# Patient Record
Sex: Female | Born: 1970 | Race: Black or African American | Hispanic: No | Smoking: Never smoker
Health system: Southern US, Community
[De-identification: ages and names within clinical notes are randomized; demographics above are authoritative.]

## PROBLEM LIST (undated history)

## (undated) DIAGNOSIS — K219 Gastro-esophageal reflux disease without esophagitis: Secondary | ICD-10-CM

## (undated) DIAGNOSIS — F329 Major depressive disorder, single episode, unspecified: Secondary | ICD-10-CM

## (undated) DIAGNOSIS — Z800 Family history of malignant neoplasm of digestive organs: Secondary | ICD-10-CM

## (undated) DIAGNOSIS — F419 Anxiety disorder, unspecified: Secondary | ICD-10-CM

## (undated) DIAGNOSIS — R011 Cardiac murmur, unspecified: Secondary | ICD-10-CM

## (undated) DIAGNOSIS — G4730 Sleep apnea, unspecified: Secondary | ICD-10-CM

## (undated) DIAGNOSIS — R519 Headache, unspecified: Secondary | ICD-10-CM

## (undated) DIAGNOSIS — E669 Obesity, unspecified: Secondary | ICD-10-CM

## (undated) DIAGNOSIS — D649 Anemia, unspecified: Secondary | ICD-10-CM

## (undated) DIAGNOSIS — F32A Depression, unspecified: Secondary | ICD-10-CM

## (undated) DIAGNOSIS — R569 Unspecified convulsions: Secondary | ICD-10-CM

## (undated) DIAGNOSIS — Z803 Family history of malignant neoplasm of breast: Secondary | ICD-10-CM

## (undated) HISTORY — PX: ABDOMINAL HYSTERECTOMY: SHX81

## (undated) HISTORY — PX: TUBAL LIGATION: SHX77

## (undated) HISTORY — DX: Family history of malignant neoplasm of digestive organs: Z80.0

## (undated) HISTORY — PX: THROAT SURGERY: SHX803

## (undated) HISTORY — PX: CHOLECYSTECTOMY: SHX55

## (undated) HISTORY — PX: GASTRIC BYPASS: SHX52

## (undated) HISTORY — DX: Family history of malignant neoplasm of breast: Z80.3

---

## 1995-12-26 HISTORY — PX: BREAST LUMPECTOMY: SHX2

## 1998-06-21 ENCOUNTER — Ambulatory Visit (HOSPITAL_COMMUNITY): Admission: RE | Admit: 1998-06-21 | Discharge: 1998-06-21 | Payer: Self-pay | Admitting: Obstetrics

## 1998-06-21 ENCOUNTER — Other Ambulatory Visit: Admission: RE | Admit: 1998-06-21 | Discharge: 1998-06-21 | Payer: Self-pay | Admitting: Obstetrics

## 1998-10-11 ENCOUNTER — Inpatient Hospital Stay (HOSPITAL_COMMUNITY): Admission: AD | Admit: 1998-10-11 | Discharge: 1998-10-11 | Payer: Self-pay | Admitting: Obstetrics

## 1998-10-29 ENCOUNTER — Ambulatory Visit (HOSPITAL_COMMUNITY): Admission: RE | Admit: 1998-10-29 | Discharge: 1998-10-29 | Payer: Self-pay | Admitting: Obstetrics

## 1999-01-03 ENCOUNTER — Inpatient Hospital Stay (HOSPITAL_COMMUNITY): Admission: AD | Admit: 1999-01-03 | Discharge: 1999-01-03 | Payer: Self-pay | Admitting: Obstetrics

## 1999-01-20 ENCOUNTER — Inpatient Hospital Stay (HOSPITAL_COMMUNITY): Admission: AD | Admit: 1999-01-20 | Discharge: 1999-01-21 | Payer: Self-pay | Admitting: Obstetrics

## 1999-01-24 ENCOUNTER — Inpatient Hospital Stay (HOSPITAL_COMMUNITY): Admission: AD | Admit: 1999-01-24 | Discharge: 1999-01-26 | Payer: Self-pay | Admitting: Obstetrics

## 2000-07-11 ENCOUNTER — Other Ambulatory Visit: Admission: RE | Admit: 2000-07-11 | Discharge: 2000-07-11 | Payer: Self-pay | Admitting: Obstetrics

## 2000-07-16 ENCOUNTER — Encounter: Admission: RE | Admit: 2000-07-16 | Discharge: 2000-07-16 | Payer: Self-pay | Admitting: Obstetrics

## 2000-07-16 ENCOUNTER — Encounter: Payer: Self-pay | Admitting: Obstetrics

## 2000-11-22 ENCOUNTER — Encounter (HOSPITAL_BASED_OUTPATIENT_CLINIC_OR_DEPARTMENT_OTHER): Payer: Self-pay | Admitting: General Surgery

## 2000-11-23 ENCOUNTER — Encounter (INDEPENDENT_AMBULATORY_CARE_PROVIDER_SITE_OTHER): Payer: Self-pay | Admitting: *Deleted

## 2000-11-23 ENCOUNTER — Ambulatory Visit (HOSPITAL_COMMUNITY): Admission: RE | Admit: 2000-11-23 | Discharge: 2000-11-23 | Payer: Self-pay | Admitting: General Surgery

## 2001-02-13 ENCOUNTER — Inpatient Hospital Stay (HOSPITAL_COMMUNITY): Admission: AD | Admit: 2001-02-13 | Discharge: 2001-02-13 | Payer: Self-pay | Admitting: Obstetrics

## 2001-03-07 ENCOUNTER — Ambulatory Visit (HOSPITAL_COMMUNITY): Admission: RE | Admit: 2001-03-07 | Discharge: 2001-03-07 | Payer: Self-pay | Admitting: Obstetrics

## 2001-03-07 ENCOUNTER — Encounter: Payer: Self-pay | Admitting: Obstetrics

## 2001-10-17 ENCOUNTER — Inpatient Hospital Stay (HOSPITAL_COMMUNITY): Admission: AD | Admit: 2001-10-17 | Discharge: 2001-10-17 | Payer: Self-pay | Admitting: Obstetrics

## 2001-10-18 ENCOUNTER — Inpatient Hospital Stay (HOSPITAL_COMMUNITY): Admission: AD | Admit: 2001-10-18 | Discharge: 2001-10-18 | Payer: Self-pay | Admitting: Obstetrics

## 2001-11-19 ENCOUNTER — Inpatient Hospital Stay (HOSPITAL_COMMUNITY): Admission: AD | Admit: 2001-11-19 | Discharge: 2001-11-19 | Payer: Self-pay | Admitting: Obstetrics

## 2001-12-20 ENCOUNTER — Inpatient Hospital Stay (HOSPITAL_COMMUNITY): Admission: AD | Admit: 2001-12-20 | Discharge: 2001-12-20 | Payer: Self-pay | Admitting: Obstetrics

## 2001-12-24 ENCOUNTER — Encounter: Payer: Self-pay | Admitting: Obstetrics

## 2001-12-24 ENCOUNTER — Ambulatory Visit (HOSPITAL_COMMUNITY): Admission: RE | Admit: 2001-12-24 | Discharge: 2001-12-24 | Payer: Self-pay | Admitting: Obstetrics

## 2002-01-09 ENCOUNTER — Inpatient Hospital Stay (HOSPITAL_COMMUNITY): Admission: AD | Admit: 2002-01-09 | Discharge: 2002-01-09 | Payer: Self-pay | Admitting: Obstetrics

## 2002-02-03 ENCOUNTER — Inpatient Hospital Stay (HOSPITAL_COMMUNITY): Admission: AD | Admit: 2002-02-03 | Discharge: 2002-02-03 | Payer: Self-pay | Admitting: Obstetrics

## 2002-02-04 ENCOUNTER — Observation Stay (HOSPITAL_COMMUNITY): Admission: AD | Admit: 2002-02-04 | Discharge: 2002-02-05 | Payer: Self-pay | Admitting: Obstetrics

## 2002-02-05 ENCOUNTER — Encounter: Payer: Self-pay | Admitting: Obstetrics

## 2002-03-02 ENCOUNTER — Observation Stay (HOSPITAL_COMMUNITY): Admission: AD | Admit: 2002-03-02 | Discharge: 2002-03-02 | Payer: Self-pay | Admitting: Obstetrics

## 2002-03-07 ENCOUNTER — Inpatient Hospital Stay (HOSPITAL_COMMUNITY): Admission: AD | Admit: 2002-03-07 | Discharge: 2002-03-12 | Payer: Self-pay | Admitting: Obstetrics

## 2002-03-11 ENCOUNTER — Encounter: Payer: Self-pay | Admitting: Obstetrics

## 2002-03-14 ENCOUNTER — Inpatient Hospital Stay (HOSPITAL_COMMUNITY): Admission: AD | Admit: 2002-03-14 | Discharge: 2002-03-14 | Payer: Self-pay | Admitting: Obstetrics

## 2002-03-18 ENCOUNTER — Encounter: Payer: Self-pay | Admitting: Obstetrics

## 2002-03-18 ENCOUNTER — Encounter (HOSPITAL_COMMUNITY): Admission: RE | Admit: 2002-03-18 | Discharge: 2002-03-18 | Payer: Self-pay | Admitting: Obstetrics

## 2002-03-19 ENCOUNTER — Inpatient Hospital Stay (HOSPITAL_COMMUNITY): Admission: AD | Admit: 2002-03-19 | Discharge: 2002-03-21 | Payer: Self-pay | Admitting: Obstetrics

## 2002-03-19 ENCOUNTER — Encounter (INDEPENDENT_AMBULATORY_CARE_PROVIDER_SITE_OTHER): Payer: Self-pay

## 2002-10-12 ENCOUNTER — Emergency Department (HOSPITAL_COMMUNITY): Admission: EM | Admit: 2002-10-12 | Discharge: 2002-10-12 | Payer: Self-pay | Admitting: Emergency Medicine

## 2002-10-12 ENCOUNTER — Encounter: Payer: Self-pay | Admitting: Emergency Medicine

## 2003-07-01 ENCOUNTER — Emergency Department (HOSPITAL_COMMUNITY): Admission: EM | Admit: 2003-07-01 | Discharge: 2003-07-01 | Payer: Self-pay | Admitting: Emergency Medicine

## 2009-09-17 ENCOUNTER — Emergency Department (HOSPITAL_COMMUNITY): Admission: EM | Admit: 2009-09-17 | Discharge: 2009-09-17 | Payer: Self-pay | Admitting: Emergency Medicine

## 2009-09-20 ENCOUNTER — Inpatient Hospital Stay (HOSPITAL_COMMUNITY): Admission: EM | Admit: 2009-09-20 | Discharge: 2009-09-22 | Payer: Self-pay | Admitting: Emergency Medicine

## 2009-09-20 ENCOUNTER — Ambulatory Visit: Payer: Self-pay | Admitting: Family Medicine

## 2010-12-31 ENCOUNTER — Inpatient Hospital Stay (HOSPITAL_COMMUNITY)
Admission: EM | Admit: 2010-12-31 | Discharge: 2011-01-05 | Payer: Self-pay | Source: Home / Self Care | Attending: Internal Medicine | Admitting: Internal Medicine

## 2011-01-03 ENCOUNTER — Encounter: Payer: Self-pay | Admitting: Internal Medicine

## 2011-01-09 LAB — CROSSMATCH
ABO/RH(D): B POS
ABO/RH(D): B POS
Antibody Screen: NEGATIVE
Antibody Screen: NEGATIVE
Unit division: 0
Unit division: 0
Unit division: 0
Unit division: 0

## 2011-01-09 LAB — IRON AND TIBC
Iron: 288 ug/dL — ABNORMAL HIGH (ref 42–135)
Iron: <10 ug/dL — ABNORMAL LOW (ref 42–135)
Saturation Ratios: 71 % — ABNORMAL HIGH (ref 20–55)
TIBC: 408 ug/dL (ref 250–470)
UIBC: 120 ug/dL
UIBC: 379 ug/dL

## 2011-01-09 LAB — COMPREHENSIVE METABOLIC PANEL
ALT: <8 U/L (ref 0–35)
AST: 17 U/L (ref 0–37)
Albumin: 3.3 g/dL — ABNORMAL LOW (ref 3.5–5.2)
Alkaline Phosphatase: 36 U/L — ABNORMAL LOW (ref 39–117)
BUN: 11 mg/dL (ref 6–23)
CO2: 23 mEq/L (ref 19–32)
Calcium: 8.7 mg/dL (ref 8.4–10.5)
Chloride: 109 mEq/L (ref 96–112)
Creatinine, Ser: 0.76 mg/dL (ref 0.4–1.2)
GFR calc Af Amer: >60 mL/min (ref >60)
GFR calc non Af Amer: >60 mL/min (ref >60)
Glucose, Bld: 86 mg/dL (ref 70–99)
Potassium: 3.8 mEq/L (ref 3.5–5.1)
Sodium: 138 mEq/L (ref 135–145)
Total Bilirubin: 2.4 mg/dL — ABNORMAL HIGH (ref 0.3–1.2)
Total Protein: 7 g/dL (ref 6.0–8.3)

## 2011-01-09 LAB — CBC
HCT: 17.2 % — ABNORMAL LOW (ref 36.0–46.0)
HCT: 24 % — ABNORMAL LOW (ref 36.0–46.0)
HCT: 23.6 % — ABNORMAL LOW (ref 36.0–46.0)
HCT: 25 % — ABNORMAL LOW (ref 36.0–46.0)
HCT: 25.5 % — ABNORMAL LOW (ref 36.0–46.0)
HCT: 28.2 % — ABNORMAL LOW (ref 36.0–46.0)
Hemoglobin: 4.7 g/dL — CL (ref 12.0–15.0)
Hemoglobin: 7.4 g/dL — ABNORMAL LOW (ref 12.0–15.0)
Hemoglobin: 7.1 g/dL — ABNORMAL LOW (ref 12.0–15.0)
Hemoglobin: 7.4 g/dL — ABNORMAL LOW (ref 12.0–15.0)
Hemoglobin: 7.5 g/dL — ABNORMAL LOW (ref 12.0–15.0)
Hemoglobin: 8.5 g/dL — ABNORMAL LOW (ref 12.0–15.0)
MCH: 15.4 pg — ABNORMAL LOW (ref 26.0–34.0)
MCH: 19.7 pg — ABNORMAL LOW (ref 26.0–34.0)
MCH: 19.3 pg — ABNORMAL LOW (ref 26.0–34.0)
MCH: 19 pg — ABNORMAL LOW (ref 26.0–34.0)
MCH: 19.2 pg — ABNORMAL LOW (ref 26.0–34.0)
MCH: 20.4 pg — ABNORMAL LOW (ref 26.0–34.0)
MCHC: 27.3 g/dL — ABNORMAL LOW (ref 30.0–36.0)
MCHC: 30.8 g/dL (ref 30.0–36.0)
MCHC: 30.1 g/dL (ref 30.0–36.0)
MCHC: 29.6 g/dL — ABNORMAL LOW (ref 30.0–36.0)
MCHC: 29.4 g/dL — ABNORMAL LOW (ref 30.0–36.0)
MCHC: 30.1 g/dL (ref 30.0–36.0)
MCV: 56.4 fL — ABNORMAL LOW (ref 78.0–100.0)
MCV: 64 fL — ABNORMAL LOW (ref 78.0–100.0)
MCV: 64.1 fL — ABNORMAL LOW (ref 78.0–100.0)
MCV: 64.3 fL — ABNORMAL LOW (ref 78.0–100.0)
MCV: 65.4 fL — ABNORMAL LOW (ref 78.0–100.0)
MCV: 67.8 fL — ABNORMAL LOW (ref 78.0–100.0)
Platelets: 274 10*3/uL (ref 150–400)
Platelets: 245 10*3/uL (ref 150–400)
Platelets: 213 10*3/uL (ref 150–400)
Platelets: 250 10*3/uL (ref 150–400)
Platelets: 224 10*3/uL (ref 150–400)
Platelets: 273 10*3/uL (ref 150–400)
RBC: 3.05 MIL/uL — ABNORMAL LOW (ref 3.87–5.11)
RBC: 3.75 MIL/uL — ABNORMAL LOW (ref 3.87–5.11)
RBC: 3.68 MIL/uL — ABNORMAL LOW (ref 3.87–5.11)
RBC: 3.89 MIL/uL (ref 3.87–5.11)
RBC: 3.9 MIL/uL (ref 3.87–5.11)
RBC: 4.16 MIL/uL (ref 3.87–5.11)
RDW: 21 % — ABNORMAL HIGH (ref 11.5–15.5)
RDW: 26.9 % — ABNORMAL HIGH (ref 11.5–15.5)
RDW: 27 % — ABNORMAL HIGH (ref 11.5–15.5)
RDW: 27.7 % — ABNORMAL HIGH (ref 11.5–15.5)
RDW: 28.7 % — ABNORMAL HIGH (ref 11.5–15.5)
RDW: 30.6 % — ABNORMAL HIGH (ref 11.5–15.5)
WBC: 6.9 10*3/uL (ref 4.0–10.5)
WBC: 6.5 10*3/uL (ref 4.0–10.5)
WBC: 6.4 10*3/uL (ref 4.0–10.5)
WBC: 7.7 10*3/uL (ref 4.0–10.5)
WBC: 9.9 10*3/uL (ref 4.0–10.5)
WBC: 8.5 10*3/uL (ref 4.0–10.5)

## 2011-01-09 LAB — DIFFERENTIAL
Basophils Absolute: 0 10*3/uL (ref 0.0–0.1)
Basophils Relative: 0 % (ref 0–1)
Eosinophils Absolute: 0.1 10*3/uL (ref 0.0–0.7)
Eosinophils Relative: 1 % (ref 0–5)
Lymphocytes Relative: 27 % (ref 12–46)
Lymphs Abs: 1.9 10*3/uL (ref 0.7–4.0)
Monocytes Absolute: 0.6 10*3/uL (ref 0.1–1.0)
Monocytes Relative: 9 % (ref 3–12)
Neutro Abs: 4.3 10*3/uL (ref 1.7–7.7)
Neutrophils Relative %: 62 % (ref 43–77)

## 2011-01-09 LAB — UIFE/LIGHT CHAINS/TP QN, 24-HR UR
Albumin, U: DETECTED
Alpha 1, Urine: DETECTED — AB
Alpha 2, Urine: DETECTED — AB
Beta, Urine: DETECTED — AB
Free Kappa Lt Chains,Ur: 3.22 mg/dL — ABNORMAL HIGH (ref 0.04–1.51)
Free Lambda Lt Chains,Ur: 0.61 mg/dL (ref 0.08–1.01)
Gamma Globulin, Urine: NOT DETECTED
Total Protein, Urine: 4.4 mg/dL

## 2011-01-09 LAB — POCT I-STAT, CHEM 8
BUN: 13 mg/dL (ref 6–23)
Calcium, Ion: 1.13 mmol/L (ref 1.12–1.32)
Chloride: 107 mEq/L (ref 96–112)
Creatinine, Ser: 0.8 mg/dL (ref 0.4–1.2)
Glucose, Bld: 87 mg/dL (ref 70–99)
HCT: 17 % — ABNORMAL LOW (ref 36.0–46.0)
Hemoglobin: 5.8 g/dL — CL (ref 12.0–15.0)
Potassium: 3.8 mEq/L (ref 3.5–5.1)
Sodium: 139 mEq/L (ref 135–145)
TCO2: 21 mmol/L (ref 0–100)

## 2011-01-09 LAB — VITAMIN B12
Vitamin B-12: 482 pg/mL (ref 211–911)
Vitamin B-12: 458 pg/mL (ref 211–911)

## 2011-01-09 LAB — BASIC METABOLIC PANEL
BUN: 10 mg/dL (ref 6–23)
BUN: 8 mg/dL (ref 6–23)
CO2: 22 mEq/L (ref 19–32)
CO2: 24 mEq/L (ref 19–32)
Calcium: 8.6 mg/dL (ref 8.4–10.5)
Calcium: 8.8 mg/dL (ref 8.4–10.5)
Chloride: 109 mEq/L (ref 96–112)
Chloride: 104 mEq/L (ref 96–112)
Creatinine, Ser: 0.76 mg/dL (ref 0.4–1.2)
Creatinine, Ser: 0.81 mg/dL (ref 0.4–1.2)
GFR calc Af Amer: >60 mL/min (ref >60)
GFR calc Af Amer: >60 mL/min (ref >60)
GFR calc non Af Amer: >60 mL/min (ref >60)
GFR calc non Af Amer: >60 mL/min (ref >60)
Glucose, Bld: 106 mg/dL — ABNORMAL HIGH (ref 70–99)
Glucose, Bld: 80 mg/dL (ref 70–99)
Potassium: 3.6 mEq/L (ref 3.5–5.1)
Potassium: 3.8 mEq/L (ref 3.5–5.1)
Sodium: 137 mEq/L (ref 135–145)
Sodium: 133 mEq/L — ABNORMAL LOW (ref 135–145)

## 2011-01-09 LAB — SAMPLE TO BLOOD BANK

## 2011-01-09 LAB — GLIADIN ANTIBODIES, SERUM
Gliadin IgA: 6 U/mL (ref <20)
Gliadin IgG: 3.2 U/mL (ref <20)

## 2011-01-09 LAB — T3, FREE: T3, Free: 2.7 pg/mL (ref 2.3–4.2)

## 2011-01-09 LAB — FOLATE
Folate: 13.4 ng/mL
Folate: 10.1 ng/mL

## 2011-01-09 LAB — PROTEIN ELECTROPH W RFLX QUANT IMMUNOGLOBULINS
Albumin ELP: 49.5 % — ABNORMAL LOW (ref 55.8–66.1)
Alpha-1-Globulin: 5.3 % — ABNORMAL HIGH (ref 2.9–4.9)
Alpha-2-Globulin: 10.3 % (ref 7.1–11.8)
Beta 2: 6.6 % — ABNORMAL HIGH (ref 3.2–6.5)
Beta Globulin: 7.8 % — ABNORMAL HIGH (ref 4.7–7.2)
Gamma Globulin: 20.5 % — ABNORMAL HIGH (ref 11.1–18.8)
M-Spike, %: NOT DETECTED g/dL
Total Protein ELP: 6.5 g/dL (ref 6.0–8.3)

## 2011-01-09 LAB — OCCULT BLOOD, POC DEVICE: Fecal Occult Bld: POSITIVE

## 2011-01-09 LAB — T4, FREE: Free T4: 0.97 ng/dL (ref 0.80–1.80)

## 2011-01-09 LAB — SEDIMENTATION RATE: Sed Rate: 62 mm/hr — ABNORMAL HIGH (ref 0–22)

## 2011-01-09 LAB — FERRITIN
Ferritin: 3 ng/mL — ABNORMAL LOW (ref 10–291)
Ferritin: 8 ng/mL — ABNORMAL LOW (ref 10–291)

## 2011-01-09 LAB — TISSUE TRANSGLUTAMINASE, IGA: Tissue Transglutaminase Ab, IgA: 11.7 U/mL (ref <20)

## 2011-01-09 LAB — PREPARE RBC (CROSSMATCH)

## 2011-01-09 LAB — TSH: TSH: 7.125 u[IU]/mL — ABNORMAL HIGH (ref 0.350–4.500)

## 2011-01-11 LAB — RETICULIN ANTIBODIES, IGA W TITER: Reticulin Ab, IgA: NEGATIVE

## 2011-01-26 NOTE — Discharge Summary (Signed)
Evelyn [name], Evelyn [name]                 ACCOUNT NO.:  [id number]  MEDICAL RECORD NO.:  [id number]          PATIENT TYPE:  INP  LOCATION:  4739                         FACILITY:  [facility]  PHYSICIAN:  Ladell Pier, M.D.   DATE OF BIRTH:  1971/11/29  DATE OF ADMISSION:  01/01/2011 DATE OF DISCHARGE:  01/05/2011                              DISCHARGE SUMMARY   DISCHARGE DIAGNOSES: 1. Headaches. 2. 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 3. Endometriosis. 4. History of migraines. 5. Acute blood loss anemia with iron deficiency. 6. Tachycardia secondary to severe anemia with echo done January 02, 2011 shows normal systolic function, normal wall motion     abnormality.  Tachycardia, now resolved. 7. Subclinical hypothyroidism.  DISCHARGE MEDICATIONS:  Fioricet p.r.n. and iron 325 mg three times daily.  FOLLOWUP APPOINTMENTS:  The patient has an appointment to follow up with [facility] on January 25 and at that appointment discuss another follow up with outpatient GYN for endometrial ablation versus hysterectomy.  PROCEDURES:  Esophagram has had abnormality narrowing on the esophagus in the lower neck at the level of C6 and C7 on the right side, question of mass.  Followup endoscopy revealed normal esophagus.  Abdominal ultrasound showed multiple intramural uterine leiomyomas.  No endometrial lesion is seen, small amount of free fluid in the pelvis. CT of the head is normal study.  CONSULTANTS:  GI, Iva Boop, MD, [facility]  HISTORY OF PRESENT ILLNESS:  The patient is a 40 year old female with complaints of left-sided headache excruciating pain for the last 5 days. She states that the pain has been constant and associated with nausea, but no photophobia.  She denies any emesis.  The patient denies any neck stiffness or meningismus.  She denies any vision changes.  The patient had not been had imaging recently of the head.  The patient is requesting further evaluation of  her headache because of routine labs. On the evaluation in the ED, she was noted to be anemic with hemoglobin of 4.7, MCV of 56.1.  The patient will be admitted for further workup of her anemia.  Past medical history, family history, social history, meds, allergies, review of systems per admission H and P.  PHYSICAL EXAMINATION:  VITAL SIGNS:  At the time of discharge, temperature 98.2, pulse 68, respirations 18, blood pressure 100/63, pulse ox of 100% on room air. GENERAL:  The patient is sitting up in bed, well-nourished African American female. HEENT:  Normocephalic, atraumatic.  Pupils are reactive to light without erythema. CARDIOVASCULAR:  Regular rate and rhythm. LUNGS:  Clear bilaterally. ABDOMEN:  Positive bowel sounds. EXTREMITIES:  Edema.  HOSPITAL COURSE: 1. Chronic blood loss anemia:  The patient was transfused 3 units of     blood.  Initial hemoglobin 4.7, hemoglobin now 8.5.  The patient     was told to take iron as her ferrin is 3.  She will follow up with     [facility] to possibly schedule with the GYN to get endometrial     ablation versus hysterectomy. 2. Severe headaches:  The patient had head CT that was  negative.  Her     headaches have now resolved.  She was discharged on Fioricet p.r.n.     and she could follow up with [facility] as outpatient for further     management of her headache. 3. Abnormal esophagram:  The patient had a esophagram done that was     abnormal, question of mass.  She then later had a EGD follow up by     Dr. Leone Payor which was normal.  LABORATORY DATA:  At the time of discharge, sodium 133, potassium 3.8, chloride 104, CO2 of 24, glucose 80, BUN 8, creatinine 0.81, calcium 8.8.  WBC 8.5, hemoglobin 8.5, MCV 67.8, platelet of 273, free T4 of 0.97.  Ferritin of 8, folate of 13.4, vitamin B12 of 482, TSH of 7.125.  Time spent with the patient and doing this discharge was approximately 35 minutes.     Ladell Pier,  M.D.     NJ/MEDQ  D:  01/05/2011  T:  01/05/2011  Job:  [id number]  Electronically Signed by Ladell Pier M.D. on 01/26/2011 01:32:38 PM

## 2011-01-26 NOTE — Procedures (Signed)
Summary: Upper Endoscopy w/DIL  Patient: Evelyn [name] Note: All result statuses are Final unless otherwise noted.  Tests: (1) Upper Endoscopy w/DIL (UED)  UED Upper Endoscopy w/DIL                             DONE     [facility]     [address]     [place], [place]  [address]           ENDOSCOPY PROCEDURE REPORT           PATIENT:  Evelyn, [name]  MR#:  [id number]     BIRTHDATE:  08/03/1971, 39 yrs. old  GENDER:  female           ENDOSCOPIST:  Iva Boop, MD, [facility]           PROCEDURE DATE:  01/03/2011     PROCEDURE:  EGD, diagnostic, Maloney Dilation of the Esophagus     ASA CLASS:  Class II     INDICATIONS:  1) abnormal GI imaging  2) dysphagia           MEDICATIONS:   Fentanyl 50 mcg IV, Versed 5 mg IV     TOPICAL ANESTHETIC:  Cetacaine Spray           DESCRIPTION OF PROCEDURE:   After the risks benefits and     alternatives of the procedure were thoroughly explained, informed     consent was obtained.  The EG-[id number] ([id number]) endoscope was     introduced through the mouth and advanced to the second portion of     the duodenum, limited slightly by retained food.  The instrument     was slowly withdrawn as the mucosa was carefully examined.     <<PROCEDUREIMAGES>>           The upper, middle, and distal third of the esophagus were     carefully inspected and no abnormalities were noted. The z-line     was well seen at the GEJ. The endoscope was pushed into the fundus     which was normal including a retroflexed view. The antrum, first     and second part of the duodenum were unremarkable except for some     retained food in the body.  Dilation was then performed at the     total esophagus           1) Dilator:  Elease Hashimoto  Size(s):  54 french     Resistance:  moderate  Heme:  yes     Appearance:           COMPLICATIONS:  None           ENDOSCOPIC IMPRESSION:     1) Normal EGD - no abnormality as described on barium swallow     was seen.  RECOMMENDATIONS:     Clear liquids only until 5 PM then soft foods. Normal foods     tomorrow.           REPEAT EXAM:  In for as needed.           Iva Boop, MD, [facility]           CC:  [facility]           n.     eSIGNED:   Iva Boop at 01/03/2011 02:17 PM  Evelyn, [name], [id number]  Note: An exclamation mark (!) indicates a result that was not dispersed into the flowsheet. Document Creation Date: 01/03/2011 2:17 PM _______________________________________________________________________  (1) Order result status: Final Collection or observation date-time: 01/03/2011 14:12 Requested date-time:  Receipt date-time:  Reported date-time:  Referring Physician:   Ordering Physician: Stan Head [phone]) Specimen Source:  Source: Launa Grill Order Number: [id number] Lab site:

## 2011-02-04 NOTE — H&P (Signed)
Evelyn [name], Evelyn [name]                 ACCOUNT NO.:  [id number]  MEDICAL RECORD NO.:  [id number]          PATIENT TYPE:  EMS  LOCATION:  MAJO                         FACILITY:  [facility]  PHYSICIAN:  Massie Maroon, MD        DATE OF BIRTH:  May 23, 1971  DATE OF ADMISSION:  12/31/2010 DATE OF DISCHARGE:                             HISTORY & PHYSICAL   CHIEF COMPLAINT:  "I am tired of my headache."  HISTORY OF PRESENT ILLNESS:  A 40 year old female with complaints of left-sided headache, excruciating pain for last 5 days.  She states that the pain has been constant and associated with nausea, but no photophobia.  She denies any emesis.  The patient denies any neck stiffness or meningismus.  She denies any vision changes.  The patient has not had imaging recently of the head.  The patient is requesting further evaluation of her headache.  Because of routine labs on evaluation in the ED, she was noted to be anemic with a hemoglobin of 4.7.  MCV was low at 56.1 and RDW was elevated at 21.0.  The patient will be admitted for workup of headache as well as anemia.  PAST MEDICAL HISTORY: 1. Anemia. 2. Excessive bleeding with her period. 3. Endometriosis. 4. History of migraines. 5. Uterine fibroids.  PAST SURGICAL HISTORY:  Laparoscopy for endometriosis and lumpectomy of her left breast.  SOCIAL HISTORY:  The patient does not smoke or drink.  She is from [place] originally.  She has 3 children.  She has had normal spontaneous vaginal delivery x3 and 1 miscarriage.  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  FAMILY HISTORY:  Mother is alive at age 12 and has diabetes and had a stroke.  Father is alive at age 82 and has diabetes and prostate cancer.  ALLERGIES:  MORPHINE.  MEDICATIONS:  Ibuprofen 800 mg p.o. b.i.d. and Midrin.  REVIEW OF SYSTEMS:  Negative for fever, chills.  Negative for abdominal pain, diarrhea, bright red blood per rectum, or black stool.  Negative for all  10-organ systems except for pertinent positives stated above.  PHYSICAL EXAMINATION:  VITAL SIGNS:  Temperature 98.4, pulse 83, blood pressure 122/55, pulse ox 100% on room air. HEENT:  Anicteric.  Pale conjunctiva. NECK:  No JVD, no bruit. HEART:  Tachycardic S1 and S2 with 2/6 systolic ejection murmur right upper sternal border. LUNGS:  Clear to auscultation bilaterally. ABDOMEN:  Soft, nontender, nondistended.  Positive bowel sounds. EXTREMITIES:  No cyanosis, clubbing, or edema. SKIN:  No rashes. LYMPH NODES:  No adenopathy. NEUROLOGIC:  Nonfocal.  LABORATORY DATA:  Hemoccult stool positive.  BUN 13, creatinine 0.8. Sodium 139, potassium 3.8.  WBC 6.9, hemoglobin 4.7, platelet count is 274, MCV 56.4, RDW is 21.0.  ASSESSMENT/PLAN: 1. Anemia:  Check iron studies, B12, folic acid, SPEP, UPEP, TSH,     check a celiac panel.  Please obtain a GI consult for evaluation of     iron-deficiency anemia presumed at this time and heme-positive     stool.  We appreciate their input.  The patient will be typed and  crossed 3 units packed red blood cells and transfused. 2. Headache:  Check a sed rate.  Check a CT of brain noncontrast.     Vicodin as needed for headache. 3. Tachycardia:  We will obtain a cardiac 2-D echo. 4. Cardiac murmur.  We will obtain a cardiac 2-D echo. 5. Deep venous thrombosis prophylaxis.  SCDs.     Massie Maroon, MD     JYK/MEDQ  D:  01/01/2011  T:  01/01/2011  Job:  [id number]  Electronically Signed by Pearson Grippe MD on 02/04/2011 10:35:27 PM

## 2011-02-09 ENCOUNTER — Ambulatory Visit: Payer: Self-pay | Admitting: Obstetrics & Gynecology

## 2011-02-09 ENCOUNTER — Other Ambulatory Visit: Payer: Self-pay | Admitting: Obstetrics & Gynecology

## 2011-02-09 ENCOUNTER — Other Ambulatory Visit (HOSPITAL_COMMUNITY)
Admission: RE | Admit: 2011-02-09 | Discharge: 2011-02-09 | Disposition: A | Discharge status: Home / Self Care | Payer: Self-pay | Source: Ambulatory Visit | Attending: Obstetrics & Gynecology | Admitting: Obstetrics & Gynecology

## 2011-02-09 ENCOUNTER — Encounter: Payer: Self-pay | Admitting: Obstetrics & Gynecology

## 2011-02-09 DIAGNOSIS — E669 Obesity, unspecified: Secondary | ICD-10-CM

## 2011-02-09 DIAGNOSIS — Z01818 Encounter for other preprocedural examination: Secondary | ICD-10-CM

## 2011-02-09 DIAGNOSIS — Z01419 Encounter for gynecological examination (general) (routine) without abnormal findings: Secondary | ICD-10-CM

## 2011-02-09 DIAGNOSIS — N938 Other specified abnormal uterine and vaginal bleeding: Secondary | ICD-10-CM | POA: Insufficient documentation

## 2011-02-09 DIAGNOSIS — N949 Unspecified condition associated with female genital organs and menstrual cycle: Secondary | ICD-10-CM | POA: Insufficient documentation

## 2011-02-09 DIAGNOSIS — N920 Excessive and frequent menstruation with regular cycle: Secondary | ICD-10-CM

## 2011-02-09 LAB — CONVERTED CEMR LAB
HCT: 29.1 % — ABNORMAL LOW (ref 36.0–46.0)
Hemoglobin: 9 g/dL — ABNORMAL LOW (ref 12.0–15.0)
MCHC: 30.9 g/dL (ref 30.0–36.0)
MCV: 74.2 fL — ABNORMAL LOW (ref 78.0–100.0)
Platelets: 284 10*3/uL (ref 150–400)
Prolactin: 10.7 ng/mL
RBC: 3.92 M/uL (ref 3.87–5.11)
TSH: 3.259 microintl units/mL (ref 0.350–4.500)
WBC: 5.5 10*3/uL (ref 4.0–10.5)

## 2011-02-28 ENCOUNTER — Encounter (HOSPITAL_COMMUNITY)
Admission: RE | Admit: 2011-02-28 | Discharge: 2011-02-28 | Disposition: A | Discharge status: Home / Self Care | Payer: Self-pay | Source: Ambulatory Visit | Attending: Obstetrics & Gynecology | Admitting: Obstetrics & Gynecology

## 2011-02-28 DIAGNOSIS — Z01812 Encounter for preprocedural laboratory examination: Secondary | ICD-10-CM | POA: Insufficient documentation

## 2011-02-28 LAB — CBC
HCT: 29.7 % — ABNORMAL LOW (ref 36.0–46.0)
Hemoglobin: 9.1 g/dL — ABNORMAL LOW (ref 12.0–15.0)
MCH: 22.5 pg — ABNORMAL LOW (ref 26.0–34.0)
MCHC: 30.6 g/dL (ref 30.0–36.0)
MCV: 73.5 fL — ABNORMAL LOW (ref 78.0–100.0)
Platelets: 311 10*3/uL (ref 150–400)
RBC: 4.04 MIL/uL (ref 3.87–5.11)
RDW: 24.7 % — ABNORMAL HIGH (ref 11.5–15.5)
WBC: 5.5 10*3/uL (ref 4.0–10.5)

## 2011-02-28 LAB — TYPE AND SCREEN
ABO/RH(D): B POS
Antibody Screen: NEGATIVE

## 2011-02-28 LAB — SURGICAL PCR SCREEN
MRSA, PCR: NEGATIVE
Staphylococcus aureus: NEGATIVE

## 2011-02-28 LAB — ABO/RH: ABO/RH(D): B POS

## 2011-03-07 ENCOUNTER — Other Ambulatory Visit: Payer: Self-pay | Admitting: Obstetrics & Gynecology

## 2011-03-07 ENCOUNTER — Inpatient Hospital Stay (HOSPITAL_COMMUNITY)
Admission: RE | Admit: 2011-03-07 | Discharge: 2011-03-09 | DRG: 743 | Disposition: A | Discharge status: Home / Self Care | Payer: Self-pay | Source: Ambulatory Visit | Attending: Obstetrics & Gynecology | Admitting: Obstetrics & Gynecology

## 2011-03-07 DIAGNOSIS — N920 Excessive and frequent menstruation with regular cycle: Principal | ICD-10-CM | POA: Diagnosis present

## 2011-03-07 DIAGNOSIS — D251 Intramural leiomyoma of uterus: Secondary | ICD-10-CM | POA: Diagnosis present

## 2011-03-07 DIAGNOSIS — D252 Subserosal leiomyoma of uterus: Secondary | ICD-10-CM | POA: Diagnosis present

## 2011-03-07 DIAGNOSIS — N801 Endometriosis of ovary: Secondary | ICD-10-CM | POA: Diagnosis present

## 2011-03-07 DIAGNOSIS — N80109 Endometriosis of ovary, unspecified side, unspecified depth: Secondary | ICD-10-CM

## 2011-03-07 DIAGNOSIS — N949 Unspecified condition associated with female genital organs and menstrual cycle: Secondary | ICD-10-CM | POA: Diagnosis present

## 2011-03-07 DIAGNOSIS — N8300 Follicular cyst of ovary, unspecified side: Secondary | ICD-10-CM

## 2011-03-07 DIAGNOSIS — N9489 Other specified conditions associated with female genital organs and menstrual cycle: Secondary | ICD-10-CM | POA: Diagnosis present

## 2011-03-07 LAB — PREGNANCY, URINE: Preg Test, Ur: NEGATIVE

## 2011-03-08 LAB — COMPREHENSIVE METABOLIC PANEL
ALT: 11 U/L (ref 0–35)
BUN: 8 mg/dL (ref 6–23)
CO2: 22 mEq/L (ref 19–32)
Calcium: 8.4 mg/dL (ref 8.4–10.5)
GFR calc non Af Amer: >60 mL/min (ref >60)
Glucose, Bld: 91 mg/dL (ref 70–99)
Sodium: 135 mEq/L (ref 135–145)

## 2011-03-08 LAB — CBC
HCT: 22 % — ABNORMAL LOW (ref 36.0–46.0)
MCHC: 31.4 g/dL (ref 30.0–36.0)
WBC: 6.8 10*3/uL (ref 4.0–10.5)

## 2011-03-08 NOTE — Op Note (Signed)
[name], [name]                 ACCOUNT NO.:  [id number]  MEDICAL RECORD NO.:  [id number]           PATIENT TYPE:  I  LOCATION:  9309                          [facility]  PHYSICIAN:  Horton Chin, MD DATE OF BIRTH:  1971-10-19  DATE OF PROCEDURE:  03/07/2011 DATE OF DISCHARGE:                              OPERATIVE REPORT   PREOPERATIVE DIAGNOSES:  Chronic pelvic pain, menometrorrhagia, fibroids.  POSTOPERATIVE DIAGNOSES:  Chronic pelvic pain, menometrorrhagia, fibroids.  PROCEDURE:  Total abdominal hysterectomy and bilateral salpingo- oophorectomy.  SURGEON:  Horton Chin, MD  ASSISTANT:  Lesly Dukes, MD  ANESTHESIOLOGIST:  Dana Allan, MD  ANESTHESIA:  General.  IV FLUIDS:  2600 mL of lactated Ringer's.  ESTIMATED BLOOD LOSS:  550 mL.  URINE OUTPUT:  400 mL.  INDICATIONS:  The patient is a 40 year old gravida 4, para 3-0-1-3 with a diagnosis of endometriosis on laparoscopy done in 1998.  She also has a history of uterine fibroids and menometrorrhagia, and desired definitive surgical management.  Prior to surgery, the patient was advised about the risks of surgery including but not limited to: bleeding, infection, injury to surrounding organs, need for additional procedures, thromboembolic phenomena, incisional problems, the fact that she will be menopausal and will need hormone replacement therapy and the patient wanted to proceed with plan.  Written informed consent was obtained.  FINDINGS:  Large fibroid uterus that weighed 534 grams in the OR, normal- appearing adnexa bilaterally.  SPECIMENS:  Uterus, cervix, bilateral adnexa which were sent to pathology.  COMPLICATIONS:  None immediate.  PROCEDURE DETAILS:  The patient received preoperative antibiotics and had sequential compression devices applied to her lower extremities while in the preoperative area.  She was then taken to the operating room where general analgesia was  administered and found to be adequate. The patient was then placed in the dorsal supine position, and prepped and draped in a sterile manner.  A Foley catheter was inserted into the patient's bladder and attached to constant gravity.  After an adequate time-out was performed, attention was turned to the patient's abdomen where a Pfannenstiel incision was made with scalpel and carried through underlying layer of fascia.  The fascia was incised bilaterally using Mayo scissors.  Kochers were then placed on both aspects of the incision and the underlying rectus muscles were dissected off bluntly and sharply.  The rectus muscles were separated in midline and the peritoneum was entered bluntly.  This incision was extended with good visualization of bladder and bowel.  Attention was then turned to the patient's pelvis where the fibroid uterus was recognized.  The bowels were packed away with laparotomy sponges and the Balfour retractor was placed.  At this point, large Kelly clamps were placed on the adnexa on both sides and the round ligaments on both sides were identified, clamped, cut and suture ligated, allowing access into the broad ligaments bilaterally.  The anterior leaf of the broad ligament was also incised to create a bladder flap.  At this point, the infundibulopelvic ligaments were also clamped, cut, and suture ligated bilaterally allowing for bilateral salpingo-oophorectomy.  Of  note, all sutures using this case are 0 Vicryl unless otherwise noted.  The uterine arteries on both sides were then skeletonized and they were clamped, cut and suture ligated.  At this point, given the bulk of the uterus, an amputation was done to remove the fundal part of the uterus and then Kochers were used to hold the cervical stump.  The rest of the cardinal and uterosacral ligaments were clamped, cut and suture ligated bilaterally.  Acutely curved clamps were then placed underneath the cervix, and  the cervix was freed from the surrounding upper vagina using Jorgenson scissors.  The corners of the incision were then secured using Heaney stitches, and the rest of the vaginal cuff was repaired using a running interlocking stitch.  Good hemostasis was noted overall. The abdomen and pelvis were irrigated and hemostasis was reconfirmed on all surfaces. Bilateral ureters were palpated and also visualized to be peristalsing  away from the area of surgery.  All instruments were then removed from the patient's pelvis.  Two interrupted sutures were placed involving the rectus muscles and peritoneum to hold the muscles together in the midline, and avoid midline bowel protrusion. At this point, the fascia was reapproximated using 0 PDS in a running fashion.  The subcutaneous layer was irrigated and reapproximated with 2-0 plain gut with 3 interrupted stitches and the skin was closed with 3-0 Monocryl subcuticular stitch.  The patient tolerated procedure well.  Sponge, instrument, needle counts were correct x2.  She was taken to recovery room in awake, extubated and in stable condition.     Horton Chin, MD     UAA/MEDQ  D:  03/07/2011  T:  03/08/2011  Job:  [id number]  Electronically Signed by Jaynie Collins MD on 03/08/2011 10:01:12 AM

## 2011-03-09 LAB — CROSSMATCH
ABO/RH(D): B POS
Antibody Screen: NEGATIVE
Unit division: 0
Unit division: 0
Unit division: 0

## 2011-03-09 LAB — CBC
HCT: 28.8 % — ABNORMAL LOW (ref 36.0–46.0)
MCHC: 31.9 g/dL (ref 30.0–36.0)
MCV: 75.6 fL — ABNORMAL LOW (ref 78.0–100.0)
Platelets: 229 10*3/uL (ref 150–400)
RDW: 20.1 % — ABNORMAL HIGH (ref 11.5–15.5)
WBC: 8.4 10*3/uL (ref 4.0–10.5)

## 2011-03-10 NOTE — Discharge Summary (Signed)
  Evelyn [name], Evelyn [name]                 ACCOUNT NO.:  [id number]  MEDICAL RECORD NO.:  [id number]           PATIENT TYPE:  I  LOCATION:  9309                          [facility]  PHYSICIAN:  Evelyn Chin, MD DATE OF BIRTH:  1971-06-24  DATE OF ADMISSION:  03/07/2011 DATE OF DISCHARGE:  03/09/2011                              DISCHARGE SUMMARY   ADMISSION DIAGNOSES: 1. Chronic pelvic pain with history of endometriosis. 2. Menometrorrhagia. 3. Leiomyomata.  DISCHARGE DIAGNOSES: 1. Chronic pelvic pain with history of endometriosis. 2. Menometrorrhagia. 3. Leiomyomata.  PROCEDURES: 1. Total abdominal hysterectomy. 2. Bilateral salpingo-oophorectomy.  PERTINENT STUDIES:  Preoperative hemoglobin of 9.1, postoperative hemoglobin of 6.9.  The patient was transfused with 3 units of blood and discharge hemoglobin was 9.1.  Surgical pathology showed leiomyomata, benign endometrium, ovarian tissue had endometriosis and endosalpingiosis, multiple follicle cysts, fallopian tubal tissue with serosal adhesions. No atypia or malignancy seen.  CLINIC WEIGHT OF SPECIMEN:  534 g.  BRIEF HOSPITAL COURSE:  The patient is a 40 year old gravida 4, para 3-0- 1-3 with a history of endometriosis and menometrorrhagia.  The patient also has a history of leiomyomata.  She desired definitive surgical management.  The patient underwent an uncomplicated TAH-BSO on March 07, 2011.  For further details of this operation, please refer to separate dictated operative report.  After surgery, the patient was noted to be anemic and was symptomatic.  As such, she did get a transfusion of 3 units of packed red blood cells on postoperative day #1.  Her post transfusion hemoglobin was 9.1. The patient's pain was controlled on oral medications.  She was ambulating without difficulty, voiding without difficulty, and passing flatus by postoperative day #2.  The patient had no problems with her incision that was  clean, dry, and intact without erythema or induration.  The patient does have Steri- Strips and was instructed on how to take care of her incision.  By postoperative day #2, the patient was deemed stable for discharge home.  DISCHARGE MEDICATIONS: 1. Percocet 5/325 mg 1-2 tablets p.o. q.6 hours p.r.n. pain. 2. Estradiol 1 mg p.o. daily. 3. Colace 100 mg p.o. b.i.d. p.r.n. constipation. 4. Motrin 600 mg p.o. q.6 hours p.r.n. pain.  DISCHARGE INSTRUCTIONS:  The patient was given routine discharge instructions including pelvic rest for 6 weeks and other routine discharge instructions. A followup appointment has been scheduled  at the [facility] on April 20, 2011, at  2 p.m.; this appointment was communicated to the patient and she  was told to call the clinic or come to the [facility]U for any further gynecologic or postoperative concerns.     Evelyn Chin, MD     UAA/MEDQ  D:  03/09/2011  T:  03/10/2011  Job:  [id number]  Electronically Signed by Jaynie Collins MD on 03/10/2011 11:22:38 AM

## 2011-03-17 NOTE — Group Therapy Note (Signed)
Evelyn [name], Evelyn [name]                 ACCOUNT NO.:  [id number]  MEDICAL RECORD NO.:  [id number]           PATIENT TYPE:  A  LOCATION:  [facility]                   FACILITY:  [facility]  PHYSICIAN:  Jaynie Collins, MD     DATE OF BIRTH:  April 19, 1971  DATE OF SERVICE:  02/09/2011                                 CLINIC NOTE  REASON FOR VISIT:  Surgical consultation for management of fibroids, menometrorrhagia, and endometriosis.  HISTORY OF PRESENT ILLNESS:  The patient is a 40 year old gravida 4, para 3-0-1-3 with a diagnosis of endometriosis on laparoscopy done in 1998 and uterine fibroids and menometrorrhagia, who is here for discussion about hysterectomy for management.  As for her endometriosis, the patient says she was diagnosed during laparoscopy in 1998, and she has had persistent pain.  She is on Fioricet as needed for her pain and has not had any recent hormonal therapy or treatment for this.  As for her dysfunctional uterine bleeding, the patient has said that this has gone on since 2006.  She was followed by Dr. Gaynell Face in the past who did multiple endometrial biopsies which were negative.  She was initially scheduled for hysterectomy in 2008, but was unable to do this due to financial concerns and insurance concerns.  In the last 2 years, she has had 2 blood transfusions and she reports persistent anemia.  The patient is on iron because of this and her last admission for transfusion was in January.  Her initial hemoglobin at that point was 4.7, a discharge hemoglobin was 8.5, and a ferritin that was done at that time was 3.  The patient also had an pelvic ultrasound done in January 2012, which showed uterus measuring 13.5 x 10 x 10 cm with multiple intramural uterine fibroids present, the largest measuring 5.3 cm in diameter and next 4.1 cm.  Her endometrial lining was noted to be 1 cm.  Her right ovary could not be visualized and her left ovary was noted to have a 3-cm cystic and  solid mass, but no calcifications and this was thought to likely be an endometrioma.  PAST MEDICAL HISTORY:  Abnormal uterine bleeding, anemia, fibroids, iron deficiency anemia.  PAST SURGICAL HISTORY:  D and C, laparoscopy, breast lump removal.  MEDICATIONS:  Iron daily and Fioricet as needed.  ALLERGIES:  MORPHINE which causes fevers, chills, and seizures according to the patient.  The patient is not allergic to latex.  SOCIAL HISTORY:  The patient is unemployed.  She lives with her family. She denies any habits.  FAMILY HISTORY:  Remarkable for a grandmother with colon cancer.  Father with prostate cancer.  A brother with diabetes.  The patient also has other family members with diabetes.  REVIEW OF SYSTEMS:  Remarkable for weight gain, weakness, fatigue, menopausal symptoms with hot flashes, headache, leg cramps, abdominal pain.  PAST OB/GYN HISTORY:  The patient has had 3 vaginal deliveries, 1 miscarriage.  She has had a tubal ligation.  She has normal Pap smears, last one being in 2010.  She does endorse a history of Trichomonas in the past and has been diagnosed with endometriosis and fibroids.  PHYSICAL EXAMINATION:  VITAL SIGNS:  Temperature 98.0, pulse 82, blood pressure 122/79, weight 266.4 pounds, and height 67-1/2 inches. GENERAL:  No apparent distress. HEENT:  Normocephalic, atraumatic. NECK:  Supple.  Normal thyroid. BREASTS:  Symmetric in size, nontender, no abnormal masses, skin changes, or lymphadenopathy noted.  The patient does have inverted nipple. ABDOMEN:  Soft, nontender, nondistended.  Obese habitus. EXTREMITIES:  No cyanosis, clubbing, or edema. PELVIC:  Normal external female genitalia.  Pink, well-rugated vagina. The patient does have some old blood mixed with vaginal discharge is seen in the vaginal vault.  She does have a multiparous cervix.  Pap smear sample was obtained.  ENDOMETRIAL BIOPSY:  The patient was counseled regarding the  endometrial biopsy and after the Pap smear was done, her cervix was swabbed with Betadine and a 3-mm Pipelle was introduced into the fundal cavity to a depth of 10 cm and this was used to obtain a moderate amount of tissue after 2 passes.  All instruments were then removed from the patient's pelvis.  The patient tolerated the procedure well.  ASSESSMENT AND PLAN:  The patient is a 40 year old gravida 4, para 3-0-1- 3, here for evaluation and management of her menometrorrhagia and endometriosis.  The patient does want definitive surgery in the form of total abdominal hysterectomy, bilateral salpingo-oophorectomy.  She was told that given that she was young and will be surgically menopausal, she will need hormone replacement therapy.  The routine risks of hysterectomy were discussed with the patient including but not limited to bleeding, infection, injury to surrounding organs, need for additional procedures, thromboembolic phenomenon, incisional problems, and she was given patient education pamphlet from the ACOG and up-to- date website discussing what to expect with a hysterectomy.  All her questions were answered.  She was told to expect to be contacted from a surgical scheduler regarding the time and date of her surgery.  Of note, we will also follow up for Pap smear and the patient will also have a mammogram scheduled for her preventative health maintenance.  She was told to call or come back in for any further gynecologic concerns or for further excessive menstrual bleeding.  The patient was given a prescription for Vicodin 60 tablets and Megace 40 mg daily 60 tablets, and told to follow up if she needs any further medication.          ______________________________ Jaynie Collins, MD    UA/MEDQ  D:  02/09/2011  T:  02/10/2011  Job:  [id number]

## 2011-03-30 ENCOUNTER — Other Ambulatory Visit: Payer: Self-pay | Admitting: Obstetrics & Gynecology

## 2011-03-31 LAB — DIFFERENTIAL
Basophils Absolute: 0 10*3/uL (ref 0.0–0.1)
Eosinophils Absolute: 0 10*3/uL (ref 0.0–0.7)
Eosinophils Relative: 0 % (ref 0–5)
Lymphocytes Relative: 20 % (ref 12–46)
Lymphocytes Relative: 21 % (ref 12–46)
Lymphs Abs: 1.1 10*3/uL (ref 0.7–4.0)
Monocytes Absolute: 0.7 10*3/uL (ref 0.1–1.0)
Myelocytes: 0 %
Neutro Abs: 3.6 10*3/uL (ref 1.7–7.7)
Neutrophils Relative %: 65 % (ref 43–77)
Neutrophils Relative %: 67 % (ref 43–77)
Promyelocytes Absolute: 0 %
nRBC: 0 /100 WBC

## 2011-03-31 LAB — BASIC METABOLIC PANEL
CO2: 23 mEq/L (ref 19–32)
Calcium: 8.8 mg/dL (ref 8.4–10.5)
Creatinine, Ser: 0.68 mg/dL (ref 0.4–1.2)
GFR calc Af Amer: >60 mL/min (ref >60)
GFR calc non Af Amer: >60 mL/min (ref >60)
Sodium: 137 mEq/L (ref 135–145)

## 2011-03-31 LAB — CROSSMATCH

## 2011-03-31 LAB — URINALYSIS, ROUTINE W REFLEX MICROSCOPIC
Ketones, ur: NEGATIVE mg/dL
Nitrite: POSITIVE — AB
Specific Gravity, Urine: 1.015 (ref 1.005–1.030)
pH: 7 (ref 5.0–8.0)

## 2011-03-31 LAB — POCT I-STAT, CHEM 8
Creatinine, Ser: 0.7 mg/dL (ref 0.4–1.2)
HCT: 21 % — ABNORMAL LOW (ref 36.0–46.0)
Hemoglobin: 7.1 g/dL — CL (ref 12.0–15.0)
Potassium: 3.9 mEq/L (ref 3.5–5.1)
Sodium: 138 mEq/L (ref 135–145)
TCO2: 21 mmol/L (ref 0–100)

## 2011-03-31 LAB — CBC
HCT: 27.8 % — ABNORMAL LOW (ref 36.0–46.0)
Hemoglobin: 5 g/dL — CL (ref 12.0–15.0)
Hemoglobin: 5.4 g/dL — CL (ref 12.0–15.0)
Hemoglobin: 8.8 g/dL — ABNORMAL LOW (ref 12.0–15.0)
MCHC: 30.4 g/dL (ref 30.0–36.0)
MCHC: 31.1 g/dL (ref 30.0–36.0)
MCV: 64.4 fL — ABNORMAL LOW (ref 78.0–100.0)
Platelets: 268 10*3/uL (ref 150–400)
RBC: 2.82 MIL/uL — ABNORMAL LOW (ref 3.87–5.11)
RBC: 3.33 MIL/uL — ABNORMAL LOW (ref 3.87–5.11)
RDW: 23.6 % — ABNORMAL HIGH (ref 11.5–15.5)
RDW: 24.6 % — ABNORMAL HIGH (ref 11.5–15.5)
RDW: 34.2 % — ABNORMAL HIGH (ref 11.5–15.5)
WBC: 6 10*3/uL (ref 4.0–10.5)
WBC: 7.1 10*3/uL (ref 4.0–10.5)

## 2011-03-31 LAB — PREPARE RBC (CROSSMATCH)

## 2011-03-31 LAB — URINE MICROSCOPIC-ADD ON

## 2011-03-31 LAB — URINE CULTURE

## 2011-03-31 LAB — HEMOCCULT GUIAC POC 1CARD (OFFICE): Fecal Occult Bld: NEGATIVE

## 2011-04-20 ENCOUNTER — Ambulatory Visit (INDEPENDENT_AMBULATORY_CARE_PROVIDER_SITE_OTHER): Payer: Self-pay | Admitting: Obstetrics & Gynecology

## 2011-04-20 DIAGNOSIS — Z09 Encounter for follow-up examination after completed treatment for conditions other than malignant neoplasm: Secondary | ICD-10-CM

## 2011-04-21 NOTE — Group Therapy Note (Signed)
Evelyn [name], Evelyn [name]                 ACCOUNT NO.:  [id number]  MEDICAL RECORD NO.:  [id number]           PATIENT TYPE:  A  LOCATION:  [facility]                   FACILITY:  [facility]  PHYSICIAN:  Jaynie Collins, MD     DATE OF BIRTH:  [birth year]  DATE OF SERVICE:  04/20/2011                                 CLINIC NOTE  REASON FOR VISIT:  Postoperative evaluation.  Evelyn [name] is a 40 year old gravida 4, para 3-0-1-3 status post a total abdominal hysterectomy and bilateral salpingo-oophorectomy on March 07, 2011.  The patient did have an uncomplicated postoperative course and also had benign pathology that was revealed to her during her postoperative day in the hospital.  Her pathology was remarkable for leiomyomata and endometriosis.  The patient also has been placed on hormone replacement therapy in the form of estradiol 1 mg daily.  She says that this does help her symptoms, but she does report occasional hot flashes.  She also reports some joint pain especially in her knees and she is wondering if this is connected to the estradiol pills.  The patient also complains of having some pain.  She has been unable to take Motrin as this causes her to have blood in her stool and she wants to know if there is any other medication she could take.  She has no other concerning symptoms.  PHYSICAL EXAMINATION:  VITAL SIGNS:  Temperature is 98.4, pulse 90, blood pressure 120/80, weight 266.5 pounds, height 66-1/2 inches. GENERAL:  No apparent distress. ABDOMEN:  Soft.  Mildly tender to palpation.  No rebound or guarding. No organomegaly or abnormal masses palpated.  Well-healing Pfannenstiel incision.  No erythema, induration, or drainage. PELVIC:  Normal external female genitalia.  Pink, well-rugated vagina. Well-healed vaginal cuff.  Normal vaginal discharge.  ASSESSMENT AND PLAN:  The patient is a 40 year old gravida 4, para 3-0-1- 3 here for postoperative check.  She is stable after total  abdominal hysterectomy and bilateral salpingo-oophorectomy for endometriosis and is on hormone replacement therapy.  The patient was told that if her joint pain does get worse, she can cut back on her estradiol dose to 0.5 mg and see if this could make her symptoms better and also control her menopausal symptoms.  As for her pain, she was given a prescription for Percocet 60 tablets to take and she was told to avoid NSAIDs for now and this is listed as an allergy for her given the GI bleed.  We will continue to monitor her symptoms.  She was told to call or come back in for any further gynecologic concerns.          ______________________________ Jaynie Collins, MD    UA/MEDQ  D:  04/20/2011  T:  04/21/2011  Job:  [id number]

## 2011-05-12 NOTE — Discharge Summary (Signed)
[facility]  Patient:    Evelyn [name], Evelyn [name] Visit Number: [id number] MRN: [id number]          Service Type: OBS Location: 910A 9118 01 Attending Physician:  Venita Sheffield Dictated by:   Kathreen Cosier, M.D. Admit Date:  03/19/2002 Discharge Date: 03/21/2002                             Discharge Summary  HISTORY OF PRESENT ILLNESS:  The patient is a 40 year old, gravida 5, para 2-0-2-2, estimated date of confinement 04/23/02.  She was admitted multiple times with premature contractions.  She was on Procardia 10 mg q.6h.  Tonight, she was admitted in labor, cervix 3 cm, and examined by the nurse in triage and said to be a breech presentation.  HOSPITAL COURSE:  She was started on magnesium sulfate, 4 g loading, 2 g per hour.  After admission to labor and delivery the patient was examined by me and found to be vertex.  This was confirmed by ultrasound.  She was vertex, -3, cervix 3 cm, 50%.  She was continued on terbutaline 5 mg p.o. q.6h. and magnesium sulfate.  Her contractions eventually ceased.  On 03/11/02, the magnesium was discontinued, and she was restarted on Procardia 10 mg p.o. q.6h.  Terbutaline was stopped.  On 03/11/02, she also had an ultrasound which gave an estimated fetal weight of 2800 g, 36+ weeks.  AFI was 6.8.  Speculum examination was negative for ruptured membranes.  It was decided because the estimated fetal weight of 2800 g, discontinue the Procardia, and that if the patient decided to go into labor she would deliver.  She was discharged on 03/12/02, not in labor, to have a non-stress test twice weekly starting on Friday, the day after discharge.  AFI weekly.  To see me on Monday for followup.  DISCHARGE MEDICATIONS:  None. Dictated by:   Kathreen Cosier, M.D. Attending Physician:  Venita Sheffield DD:  04/09/02 TD:  04/09/02 Job: [id number]

## 2011-05-12 NOTE — Discharge Summary (Signed)
[facility]  Patient:    [name], [name] Visit Number: [id number] MRN: [id number]          Service Type: OBS Location: 910A 9118 01 Attending Physician:  Venita Sheffield Dictated by:   Kathreen Cosier, M.D. Admit Date:  03/19/2002 Discharge Date: 03/21/2002                             Discharge Summary  HOSPITAL COURSE:              The patient is a 40 year old gravida 5, para 2-0-2-2, [facility] April 23, 2002, with a history of premature labor.  She was admitted in labor on March 26 at 6 cm, 100%, rapid progress, and had a normal vaginal delivery of a female with Apgars 8/9.  The patient desired sterilization.  Her hemoglobin on the day after delivery was 9.3, platelets 210.  On March 20, 2002, she underwent postpartum tubal ligation.  She did well and was discharged home on March 28.  DISCHARGE MEDICATIONS:        Tylox 1 p.o. q.4h. p.r.n.  FOLLOWUP:                     She is to see me in six weeks.  DISCHARGE DIAGNOSES:          1. Status post normal vaginal delivery.                               2. Postpartum tubal ligation. Dictated by:   Kathreen Cosier, M.D. Attending Physician:  Venita Sheffield DD:  04/09/02 TD:  04/09/02 Job: [id number]

## 2011-05-12 NOTE — Op Note (Signed)
[facility]  Patient:    Evelyn [name], Evelyn [name] Visit Number: [id number] MRN: [id number]          Service Type: ANT Location: MATC Attending Physician:  Venita Sheffield Dictated by:   Kathreen Cosier, M.D. Proc. Date: 03/20/02 Admit Date:  03/18/2002 Discharge Date: 03/18/2002                             Operative Report  PREOPERATIVE DIAGNOSES:       Multiparity.  PROCEDURE:                    Postpartum tubal ligation.  ANESTHESIA:                   General.  PROCEDURE:                    Patient placed on the operating table in supine position.  Abdomen prepped and draped.  Bladder emptied with straight catheter.  Midline subumbilical incision made, carried 1 inch ______, carried down through the fascia.  Fascia cleaned, grasped with two Kochers, and fascia and peritoneum opened with Mayo scissors.  The left tube was grasped in the mid portion with the Babcock clamp and tube traced to the fimbria.  Plain 0 suture placed in the mesosalpinx below the portion of tube within the clamp. This was tied and approximately 1 inch of tube transected.  Procedure done in the exact fashion on the other side.  Lap and sponge counts correct.  Abdomen closed in layers.  Peritoneum and fascia in continuous suture of 0 Dexon. Skin closed with subcuticular stitch of 3-0 plain.  Patient tolerated procedure well.  Taken to recovery room in good condition. Dictated by:   Kathreen Cosier, M.D. Attending Physician:  Venita Sheffield DD:  03/20/02 TD:  03/21/02 Job: [id number]

## 2011-06-24 ENCOUNTER — Emergency Department (HOSPITAL_COMMUNITY): Payer: Self-pay

## 2011-06-24 ENCOUNTER — Emergency Department (HOSPITAL_COMMUNITY)
Admission: EM | Admit: 2011-06-24 | Discharge: 2011-06-24 | Disposition: A | Discharge status: Home / Self Care | Payer: Self-pay | Attending: Emergency Medicine | Admitting: Emergency Medicine

## 2011-06-24 DIAGNOSIS — M79609 Pain in unspecified limb: Secondary | ICD-10-CM | POA: Insufficient documentation

## 2011-06-24 DIAGNOSIS — S93609A Unspecified sprain of unspecified foot, initial encounter: Secondary | ICD-10-CM | POA: Insufficient documentation

## 2011-06-24 DIAGNOSIS — IMO0002 Reserved for concepts with insufficient information to code with codable children: Secondary | ICD-10-CM | POA: Insufficient documentation

## 2011-06-24 DIAGNOSIS — M7989 Other specified soft tissue disorders: Secondary | ICD-10-CM | POA: Insufficient documentation

## 2011-12-30 ENCOUNTER — Other Ambulatory Visit: Payer: Self-pay

## 2011-12-30 ENCOUNTER — Encounter: Payer: Self-pay | Admitting: *Deleted

## 2011-12-30 ENCOUNTER — Emergency Department (HOSPITAL_COMMUNITY)
Admission: EM | Admit: 2011-12-30 | Discharge: 2011-12-31 | Disposition: A | Discharge status: Home / Self Care | Payer: Self-pay | Attending: Emergency Medicine | Admitting: Emergency Medicine

## 2011-12-30 DIAGNOSIS — R0609 Other forms of dyspnea: Secondary | ICD-10-CM | POA: Insufficient documentation

## 2011-12-30 DIAGNOSIS — R609 Edema, unspecified: Secondary | ICD-10-CM | POA: Insufficient documentation

## 2011-12-30 DIAGNOSIS — R0989 Other specified symptoms and signs involving the circulatory and respiratory systems: Secondary | ICD-10-CM | POA: Insufficient documentation

## 2011-12-30 DIAGNOSIS — R079 Chest pain, unspecified: Secondary | ICD-10-CM | POA: Insufficient documentation

## 2011-12-30 DIAGNOSIS — R0602 Shortness of breath: Secondary | ICD-10-CM | POA: Insufficient documentation

## 2011-12-30 DIAGNOSIS — R05 Cough: Secondary | ICD-10-CM | POA: Insufficient documentation

## 2011-12-30 DIAGNOSIS — R059 Cough, unspecified: Secondary | ICD-10-CM | POA: Insufficient documentation

## 2011-12-30 DIAGNOSIS — R0600 Dyspnea, unspecified: Secondary | ICD-10-CM

## 2011-12-30 DIAGNOSIS — R51 Headache: Secondary | ICD-10-CM | POA: Insufficient documentation

## 2011-12-30 NOTE — ED Notes (Signed)
Pt reports SOB, cough that started on Wednesday.  Reports CP that started then as well.  Reports that the pain is tightness in her chest.  Reports a HA since that time also.   Pt ambulatory.  CP is unchanged by anything.

## 2011-12-31 ENCOUNTER — Emergency Department (HOSPITAL_COMMUNITY): Payer: Self-pay

## 2011-12-31 LAB — URINALYSIS, ROUTINE W REFLEX MICROSCOPIC
Bilirubin Urine: NEGATIVE
Ketones, ur: NEGATIVE mg/dL
Nitrite: NEGATIVE
Urobilinogen, UA: 0.2 mg/dL (ref 0.0–1.0)

## 2011-12-31 LAB — POCT I-STAT, CHEM 8
Calcium, Ion: 1.23 mmol/L (ref 1.12–1.32)
Glucose, Bld: 121 mg/dL — ABNORMAL HIGH (ref 70–99)
HCT: 33 % — ABNORMAL LOW (ref 36.0–46.0)
Hemoglobin: 11.2 g/dL — ABNORMAL LOW (ref 12.0–15.0)
TCO2: 25 mmol/L (ref 0–100)

## 2011-12-31 LAB — CBC
HCT: 31.8 % — ABNORMAL LOW (ref 36.0–46.0)
Hemoglobin: 10.6 g/dL — ABNORMAL LOW (ref 12.0–15.0)
MCH: 25.8 pg — ABNORMAL LOW (ref 26.0–34.0)
MCHC: 33.3 g/dL (ref 30.0–36.0)

## 2011-12-31 LAB — DIFFERENTIAL
Basophils Absolute: 0 10*3/uL (ref 0.0–0.1)
Basophils Relative: 0 % (ref 0–1)
Eosinophils Absolute: 0 10*3/uL (ref 0.0–0.7)
Eosinophils Relative: 1 % (ref 0–5)
Lymphocytes Relative: 29 % (ref 12–46)

## 2011-12-31 LAB — D-DIMER, QUANTITATIVE: D-Dimer, Quant: 1.85 ug/mL-FEU — ABNORMAL HIGH (ref 0.00–0.48)

## 2011-12-31 MED ORDER — SODIUM CHLORIDE 0.9 % IV SOLN
INTRAVENOUS | Status: DC
  Administered 2011-12-31: 02:00:00 via INTRAVENOUS

## 2011-12-31 MED ORDER — SODIUM CHLORIDE 0.9 % IV BOLUS (SEPSIS)
500.0000 mL | Freq: Once | INTRAVENOUS | Status: AC
  Administered 2011-12-31: 500 mL via INTRAVENOUS

## 2011-12-31 MED ORDER — IOHEXOL 350 MG/ML SOLN
100.0000 mL | Freq: Once | INTRAVENOUS | Status: AC | PRN
  Administered 2011-12-31: 100 mL via INTRAVENOUS

## 2011-12-31 MED ORDER — IBUPROFEN 800 MG PO TABS
800.0000 mg | ORAL_TABLET | Freq: Once | ORAL | Status: AC
  Administered 2011-12-31: 800 mg via ORAL
  Filled 2011-12-31: qty 1

## 2011-12-31 NOTE — ED Notes (Signed)
Patient reports intermittent chest pain x couple days also states painful urination and requests a wet prep to be tested for trichomonas. EDP made aware

## 2011-12-31 NOTE — ED Provider Notes (Signed)
History     CSN: [id number]  Arrival date & time 12/30/11  2243   First MD Initiated Contact with Patient 12/31/11 0023      Chief Complaint  Patient presents with  . Shortness of Breath  . Chest Pain    (Consider location/radiation/quality/duration/timing/severity/associated sxs/prior treatment) Patient is a 41 y.o. female presenting with shortness of breath and chest pain. The history is provided by the patient.  Shortness of Breath  The current episode started 3 to 5 days ago. Associated symptoms include chest pain and shortness of breath.  Chest Pain Primary symptoms include shortness of breath. Pertinent negatives for primary symptoms include no abdominal pain, no nausea and no vomiting.  Pertinent negatives for associated symptoms include no numbness and no weakness.    patient's had shortness of breath since Wednesday. She also gets chest pain with the episodes. It is described as a tightness. She's also had a headache. Chest pain she states it's slightly better if she coughs. She's not coughing anything up. No fevers. No trauma. She states this feels like muscle type. No diaphoresis. No nausea vomiting. She states she had an episode like this before, before she had her hysterectomy. She states her hemoglobin was 4 at that time. She does not smoke.  History reviewed. No pertinent past medical history.  Past Surgical History  Procedure Date  . Abdominal hysterectomy     History reviewed. No pertinent family history.  History  Substance Use Topics  . Smoking status: Never Smoker   . Smokeless tobacco: Not on file  . Alcohol Use: No    OB History    Grav Para Term Preterm Abortions TAB SAB Ect Mult Living                  Review of Systems  Constitutional: Negative for activity change and appetite change.  HENT: Negative for neck stiffness.   Eyes: Negative for pain.  Respiratory: Positive for shortness of breath. Negative for chest tightness.   Cardiovascular:  Positive for chest pain. Negative for leg swelling.  Gastrointestinal: Negative for nausea, vomiting, abdominal pain and diarrhea.  Genitourinary: Negative for flank pain.  Musculoskeletal: Negative for back pain.  Skin: Negative for rash.  Neurological: Negative for weakness, numbness and headaches.  Psychiatric/Behavioral: Negative for behavioral problems.    Allergies  Morphine and related and Tylenol  Home Medications  No current outpatient prescriptions on file.  BP 114/55  Pulse 95  Temp(Src) 98.1 F (36.7 C) (Oral)  Resp 16  SpO2 99%  Physical Exam  Nursing note and vitals reviewed. Constitutional: She is oriented to person, place, and time. She appears well-developed and well-nourished.  HENT:  Head: Normocephalic and atraumatic.  Eyes: EOM are normal. Pupils are equal, round, and reactive to light.  Neck: Normal range of motion. Neck supple.  Cardiovascular: Normal rate, regular rhythm and normal heart sounds.   No murmur heard. Pulmonary/Chest: Effort normal and breath sounds normal. No respiratory distress. She has no wheezes. She has no rales.  Abdominal: Soft. Bowel sounds are normal. She exhibits no distension. There is no tenderness. There is no rebound and no guarding.  Musculoskeletal: Normal range of motion. She exhibits edema.       Mild bilateral lower extremity pitting edema  Neurological: She is alert and oriented to person, place, and time. No cranial nerve deficit.  Skin: Skin is warm and dry.  Psychiatric: She has a normal mood and affect. Her speech is normal.   pelvic  exam showed no cervix and some mild white vaginal discharge.  ED Course  Procedures (including critical care time)  Labs Reviewed  CBC - Abnormal; Notable for the following:    Hemoglobin 10.6 (*)    HCT 31.8 (*)    MCV 77.4 (*)    MCH 25.8 (*)    All other components within normal limits  D-DIMER, QUANTITATIVE - Abnormal; Notable for the following:    D-Dimer, Quant 1.85 (*)     All other components within normal limits  POCT I-STAT, CHEM 8 - Abnormal; Notable for the following:    Potassium 3.2 (*)    Glucose, Bld 121 (*)    Hemoglobin 11.2 (*)    HCT 33.0 (*)    All other components within normal limits  WET PREP, GENITAL - Abnormal; Notable for the following:    Clue Cells, Wet Prep FEW (*)    WBC, Wet Prep HPF POC FEW (*)    All other components within normal limits  DIFFERENTIAL  TROPONIN I  URINALYSIS, ROUTINE W REFLEX MICROSCOPIC  I-STAT, CHEM 8  GC/CHLAMYDIA PROBE AMP, GENITAL   Dg Chest 2 View  12/31/2011  *RADIOLOGY REPORT*  Clinical Data: Chest pain, shortness of breath.  CHEST - 2 VIEW  Comparison: None.  Findings: Minimal bibasilar atelectasis.  Otherwise, no focal areas of consolidation.  No pleural effusion or pneumothorax. Cardiomediastinal contours are within normal limits.  No acute osseous abnormality.  IMPRESSION: No acute process identified.  Original Report Authenticated By: Waneta Martins, M.D.   Ct Angio Chest W/cm &/or Wo Cm  12/31/2011  *RADIOLOGY REPORT*  Clinical Data:  Shortness of breath  CT ANGIOGRAPHY CHEST WITH CONTRAST  Technique:  Multidetector CT imaging of the chest was performed using the standard protocol during bolus administration of intravenous contrast.  Multiplanar CT image reconstructions including MIPs were obtained to evaluate the vascular anatomy.  Contrast: OMNIPAQUE IOHEXOL 350 MG/ML IV SOLN  Comparison:  12/31/2011 radiograph  Findings:  No pulmonary arterial branch filling defect identified. Heart size upper normal limits.  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  No intrathoracic lymphadenopathy. Trace pericardial fluid.  Normal caliber aorta.  Limited images through the upper abdomen show no acute abnormality.  Central airways are patent.  Minimal dependent atelectasis.  No pneumothorax.  No acute osseous abnormality identified.  Review of the MIP images confirms the above findings.   IMPRESSION: No pulmonary embolism or acute intrathoracic process identified.  Trace pericardial fluid.  Original Report Authenticated By: Waneta Martins, M.D.     1. Dyspnea   2. Chest pain      Date: 12/31/2011  Rate: 88  Rhythm: normal sinus rhythm  QRS Axis: normal  Intervals: normal  ST/T Wave abnormalities: normal  Conduction Disutrbances:none  Narrative Interpretation:   Old EKG Reviewed: none available    MDM  Dyspnea chest pain. Been going since Wednesday. Worse with exertion. EKG is reassuring. Cardiac enzymes are negative. D-dimer was done and was elevated. CT angiogram was negative for pulmonary embolism. She also complained about some vaginal discharge and dysuria. No clear infection she has had a hysterectomy. She'll be discharged home. She'll follow with cardiology.        Juliet Rude. Rubin Payor, MD 12/31/11 [phone]

## 2012-01-01 LAB — GC/CHLAMYDIA PROBE AMP, GENITAL
Chlamydia, DNA Probe: NEGATIVE
GC Probe Amp, Genital: NEGATIVE

## 2012-04-25 ENCOUNTER — Emergency Department (HOSPITAL_COMMUNITY)
Admission: EM | Admit: 2012-04-25 | Discharge: 2012-04-25 | Disposition: A | Discharge status: Home / Self Care | Payer: Self-pay | Attending: Emergency Medicine | Admitting: Emergency Medicine

## 2012-04-25 ENCOUNTER — Other Ambulatory Visit: Payer: Self-pay | Admitting: Obstetrics & Gynecology

## 2012-04-25 ENCOUNTER — Encounter (HOSPITAL_COMMUNITY): Payer: Self-pay | Admitting: *Deleted

## 2012-04-25 ENCOUNTER — Emergency Department (HOSPITAL_COMMUNITY): Payer: Self-pay

## 2012-04-25 DIAGNOSIS — R070 Pain in throat: Secondary | ICD-10-CM | POA: Insufficient documentation

## 2012-04-25 DIAGNOSIS — R509 Fever, unspecified: Secondary | ICD-10-CM | POA: Insufficient documentation

## 2012-04-25 DIAGNOSIS — R093 Abnormal sputum: Secondary | ICD-10-CM | POA: Insufficient documentation

## 2012-04-25 DIAGNOSIS — J3489 Other specified disorders of nose and nasal sinuses: Secondary | ICD-10-CM | POA: Insufficient documentation

## 2012-04-25 DIAGNOSIS — J069 Acute upper respiratory infection, unspecified: Secondary | ICD-10-CM | POA: Insufficient documentation

## 2012-04-25 DIAGNOSIS — R05 Cough: Secondary | ICD-10-CM | POA: Insufficient documentation

## 2012-04-25 DIAGNOSIS — R059 Cough, unspecified: Secondary | ICD-10-CM | POA: Insufficient documentation

## 2012-04-25 DIAGNOSIS — Z79899 Other long term (current) drug therapy: Secondary | ICD-10-CM | POA: Insufficient documentation

## 2012-04-25 DIAGNOSIS — H9209 Otalgia, unspecified ear: Secondary | ICD-10-CM | POA: Insufficient documentation

## 2012-04-25 DIAGNOSIS — IMO0001 Reserved for inherently not codable concepts without codable children: Secondary | ICD-10-CM | POA: Insufficient documentation

## 2012-04-25 MED ORDER — GUAIFENESIN-CODEINE 100-10 MG/5ML PO SYRP: 5.0000 mL | ORAL_SOLUTION | Freq: Three times a day (TID) | ORAL | Status: AC | PRN

## 2012-04-25 NOTE — Discharge Instructions (Signed)
Your chest xray was clear and did not show signs of a pneumonia. This is likely an upper respiratory infection (cold). You have been given a prescription for cough medication with codeine to help suppress the cough. Alternate Tylenol and Motrin every 4 hours for fever. Make sure to drink plenty of fluids. Follow up with your family doctor if not improving.  RESOURCE GUIDE  Dental Problems  Patients with Medicaid: [facility] [phone] [address]                                           [phone] [address] Phone:  [phone]                                                  Phone:  [phone]  If unable to pay or uninsured, contact:  [facility] or [facility]. to become qualified for the adult dental clinic.  Chronic Pain Problems Contact [facility]  [phone] Patients need to be referred by their primary care doctor.  Insufficient Money for Medicine Contact [employer] Way:  call "211" or [facility] 9083850408.  No Primary Care Doctor Call [facility]  7728145197 Other agencies that provide inexpensive medical care    [facility]  [phone]    [facility]  [phone]    [facility]  [phone]    [facility]  [phone]    [facility]  [phone])605-5888    [facility]  6045534756  Psychological Services [facility]  [phone] [facility] Services  [phone] [facility]   [phone] (emergency services ([phone])389-5309)  Substance Abuse Resources [facility]  (985)782-5430 [facility] ([phone] [facility] [phone] Floydene Flock 6090035029 Residential & Outpatient Substance Abuse Program  872-319-3596  Abuse/Neglect [facility] Child Abuse Hotline 343-481-0455 [facility] Child Abuse Hotline (630)288-1495 (After Hours)  Emergency Shelter [facility]  562-624-2835  Maternity Homes [facility] at the [place] [facility] [place] 425-234-4240 [facility] Services [phone]  MRSA Hotline #:   979-188-0810    [facility] Resources  [facility]     [employer]                          [facility] Dept. [address] [place]                       [address]      [address]  [place]                                                Cristobal Goldmann Phone:  [phone]  Phone:  [phone]                 Phone:  [phone]  [facility] Phone:  [phone]  [facility] Child Abuse Hotline (808)084-6296 681-860-5205 (After Hours)  Upper Respiratory Infection, Adult An upper respiratory infection (URI) is also sometimes known as the common cold. The upper respiratory tract includes the nose, sinuses, throat, trachea, and bronchi. Bronchi are the airways leading to the lungs. Most people improve within 1 week, but symptoms can last up to 2 weeks. A residual cough may last even longer.  CAUSES Many different viruses can infect the tissues lining the upper respiratory tract. The tissues become irritated and inflamed and often become very moist. Mucus production is also common. A cold is contagious. You can easily spread the virus to others by oral contact. This includes kissing, sharing a glass, coughing, or sneezing. Touching your mouth or nose and then touching a surface, which is then touched by another person, can also spread the virus. SYMPTOMS  Symptoms typically develop 1 to 3 days after you come in contact with a cold virus. Symptoms vary from person to person. They may include:  Runny nose.   Sneezing.   Nasal congestion.   Sinus irritation.   Sore throat.   Loss of voice (laryngitis).   Cough.   Fatigue.   Muscle aches.   Loss of appetite.   Headache.   Low-grade fever.  DIAGNOSIS    You might diagnose your own cold based on familiar symptoms, since most people get a cold 2 to 3 times a year. Your caregiver can confirm this based on your exam. Most importantly, your caregiver can check that your symptoms are not due to another disease such as strep throat, sinusitis, pneumonia, asthma, or epiglottitis. Blood tests, throat tests, and X-rays are not necessary to diagnose a common cold, but they may sometimes be helpful in excluding other more serious diseases. Your caregiver will decide if any further tests are required. RISKS AND COMPLICATIONS  You may be at risk for a more severe case of the common cold if you smoke cigarettes, have chronic heart disease (such as heart failure) or lung disease (such as asthma), or if you have a weakened immune system. The very young and very old are also at risk for more serious infections. Bacterial sinusitis, middle ear infections, and bacterial pneumonia can complicate the common cold. The common cold can worsen asthma and chronic obstructive pulmonary disease (COPD). Sometimes, these complications can require emergency medical care and may be life-threatening. PREVENTION  The best way to protect against getting a cold is to practice good hygiene. Avoid oral or hand contact with people with cold symptoms. Wash your hands often if contact occurs. There is no clear evidence that vitamin C, vitamin E, echinacea, or exercise reduces the chance of developing a cold. However, it is always recommended to get plenty of rest and practice good nutrition. TREATMENT  Treatment is directed at relieving symptoms. There is no cure. Antibiotics are not effective, because the infection is caused by a virus, not by bacteria. Treatment may include:  Increased fluid intake. Sports drinks offer valuable electrolytes, sugars, and fluids.   Breathing heated mist or steam (vaporizer or shower).   Eating chicken soup or other clear broths, and maintaining good  nutrition.   Getting plenty of rest.   Using gargles or lozenges for comfort.   Controlling fevers with ibuprofen or acetaminophen as directed by your  caregiver.   Increasing usage of your inhaler if you have asthma.  Zinc gel and zinc lozenges, taken in the first 24 hours of the common cold, can shorten the duration and lessen the severity of symptoms. Pain medicines may help with fever, muscle aches, and throat pain. A variety of non-prescription medicines are available to treat congestion and runny nose. Your caregiver can make recommendations and may suggest nasal or lung inhalers for other symptoms.  HOME CARE INSTRUCTIONS   Only take over-the-counter or prescription medicines for pain, discomfort, or fever as directed by your caregiver.   Use a warm mist humidifier or inhale steam from a shower to increase air moisture. This may keep secretions moist and make it easier to breathe.   Drink enough water and fluids to keep your urine clear or pale yellow.   Rest as needed.   Return to work when your temperature has returned to normal or as your caregiver advises. You may need to stay home longer to avoid infecting others. You can also use a face mask and careful hand washing to prevent spread of the virus.  SEEK MEDICAL CARE IF:   After the first few days, you feel you are getting worse rather than better.   You need your caregiver's advice about medicines to control symptoms.   You develop chills, worsening shortness of breath, or brown or red sputum. These may be signs of pneumonia.   You develop yellow or brown nasal discharge or pain in the face, especially when you bend forward. These may be signs of sinusitis.   You develop a fever, swollen neck glands, pain with swallowing, or white areas in the back of your throat. These may be signs of strep throat.  SEEK IMMEDIATE MEDICAL CARE IF:   You have a fever.   You develop severe or persistent headache, ear pain, sinus pain,  or chest pain.   You develop wheezing, a prolonged cough, cough up blood, or have a change in your usual mucus (if you have chronic lung disease).   You develop sore muscles or a stiff neck.  Document Released: 06/06/2001 Document Revised: 11/30/2011 Document Reviewed: 04/14/2011 [facility] Patient Information 2012 [place], [place].

## 2012-04-25 NOTE — ED Notes (Signed)
Pt reports URI symptoms with productive cough starting Monday and progressing. Pt reports yellow sputum. Pt reports body aches, nasal drainage and sore throat. Pt has tried taking robitussin and Advil.  Pt also reports fever of 102 yesterday.  Pt denies history of seasonal allergies or asthma.

## 2012-04-25 NOTE — ED Provider Notes (Signed)
History     CSN: [id number]  Arrival date & time 04/25/12  1317   First MD Initiated Contact with Patient 04/25/12 1343      Chief Complaint  Patient presents with  . Cough  . Fever  . Sore Throat  . Generalized Body Aches    (Consider location/radiation/quality/duration/timing/severity/associated sxs/prior treatment) HPI History from patient. 41 year old female who presents with 3 days of URI like symptoms. She states she began to have coughing on Monday which has progressively gotten worse. Cough is productive of yellow sputum. She has had accompanying nasal drainage, myalgias, sore throat, and right ear pain. She has tried Robitussin and Advil with little relief. Also states that she had a fever of 102 last evening, but has not had additional fevers. She is not a smoker. Denies history of allergic rhinitis or asthma. Denies associated chest pain, shortness of breath, abdominal pain, nausea, vomiting, diarrhea.  History reviewed. No pertinent past medical history.  Past Surgical History  Procedure Date  . Abdominal hysterectomy     No family history on file.  History  Substance Use Topics  . Smoking status: Never Smoker   . Smokeless tobacco: Not on file  . Alcohol Use: No    OB History    Grav Para Term Preterm Abortions TAB SAB Ect Mult Living                  Review of Systems  Constitutional: Positive for fever. Negative for chills, activity change and appetite change.  HENT: Positive for ear pain, congestion and sore throat. Negative for neck pain, neck stiffness and sinus pressure.   Eyes: Negative for visual disturbance.  Respiratory: Positive for cough. Negative for chest tightness, shortness of breath and wheezing.   Cardiovascular: Negative for chest pain, palpitations and leg swelling.  Gastrointestinal: Negative for nausea, vomiting, abdominal pain and diarrhea.  Musculoskeletal: Negative for myalgias.  Skin: Negative for color change and rash.    Neurological: Negative for dizziness, weakness and headaches.    Allergies  Morphine and related; Tylenol; and Vicodin  Home Medications   Current Outpatient Rx  Name Route Sig Dispense Refill  . ESTRADIOL 1 MG PO TABS Oral Take 1 mg by mouth daily.    . GUAIFENESIN 100 MG/5ML PO LIQD Oral Take 200 mg by mouth 3 (three) times daily as needed. Coughing    . IBUPROFEN 200 MG PO TABS Oral Take 400 mg by mouth every 6 (six) hours as needed. pain      BP 121/71  Pulse 86  Temp(Src) 99.7 F (37.6 C) (Oral)  Resp 16  SpO2 97%  Physical Exam  Nursing note and vitals reviewed. Constitutional: She is oriented to person, place, and time. She appears well-developed and well-nourished. No distress.  HENT:  Head: Normocephalic and atraumatic.  Right Ear: External ear normal.  Left Ear: External ear normal.  Mouth/Throat: Oropharynx is clear and moist. No oropharyngeal exudate.       Tympanic membranes normal bilaterally. Sinuses are nontender to percussion. Oropharynx clear.  Eyes: EOM are normal. Pupils are equal, round, and reactive to light.  Neck: Normal range of motion. Neck supple.  Cardiovascular: Normal rate, regular rhythm and normal heart sounds.   Pulmonary/Chest: Effort normal.       Scattered rhonchi throughout  Abdominal: Soft. Bowel sounds are normal. There is no tenderness. There is no rebound and no guarding.  Musculoskeletal: Normal range of motion.  Neurological: She is alert and oriented to person, place,  and time.  Skin: Skin is warm and dry. She is not diaphoretic.  Psychiatric: She has a normal mood and affect.    ED Course  Procedures (including critical care time)  Labs Reviewed - No data to display Dg Chest 2 View  04/25/2012  *RADIOLOGY REPORT*  Clinical Data: Cough and congestion.  CHEST - 2 VIEW  Comparison: CT chest and chest radiograph 12/31/2011.  Findings: Trachea is midline.  Heart size stable.  Lungs are low in volume but clear. No pleural fluid.   IMPRESSION: No acute findings.  Original Report Authenticated By: Reyes Ivan, M.D.     1. URI (upper respiratory infection)       MDM  Pt presents with URI sx and reported fever at home. She has a slightly elevated temp here (99.7). Vitals otherwise stable. Physical exam unremarkable. CXR clear. Feel this is likely URI. Instructed symptomatic treatment. Return precautions discussed. Pt agreeable to plan.        Grant Fontana, [place] 04/25/12 [phone]

## 2012-05-01 NOTE — ED Provider Notes (Signed)
Medical screening examination/treatment/procedure(s) were performed by non-physician practitioner and as supervising physician I was immediately available for consultation/collaboration.  Travin Marik, MD 05/01/12 1506 

## 2012-07-10 ENCOUNTER — Emergency Department (HOSPITAL_COMMUNITY)
Admission: EM | Admit: 2012-07-10 | Discharge: 2012-07-10 | Disposition: A | Discharge status: Home / Self Care | Payer: Self-pay | Attending: Emergency Medicine | Admitting: Emergency Medicine

## 2012-07-10 ENCOUNTER — Encounter (HOSPITAL_COMMUNITY): Payer: Self-pay | Admitting: *Deleted

## 2012-07-10 DIAGNOSIS — N750 Cyst of Bartholin's gland: Secondary | ICD-10-CM | POA: Insufficient documentation

## 2012-07-10 MED ORDER — TRAMADOL HCL 50 MG PO TABS
50.0000 mg | ORAL_TABLET | Freq: Four times a day (QID) | ORAL | Status: AC | PRN
Start: 2012-07-10 — End: 2012-07-20

## 2012-07-10 NOTE — ED Notes (Signed)
Pt asked to change into gown. 

## 2012-07-10 NOTE — ED Provider Notes (Addendum)
History   This chart was scribed for Nelia Shi, MD by Sofie Rower. The patient was seen in room TR08C/TR08C and the patient's care was started at 12:12 PM     CSN: [id number]  Arrival date & time 07/10/12  1139   None     Chief Complaint  Patient presents with  . Recurrent Skin Infections    (Consider location/radiation/quality/duration/timing/severity/associated sxs/prior treatment) Patient is a 41 y.o. female presenting with abscess. The history is provided by the patient. No language interpreter was used.  Abscess  This is a new problem. The current episode started less than one week ago. The onset was gradual. The problem occurs continuously. The problem has been gradually worsening. The abscess is present on the genitalia and groin. The problem is moderate. The abscess is characterized by swelling. It is unknown what she was exposed to. The abscess first occurred at home. Pertinent negatives include no fever, no diarrhea and no vomiting. There were no sick contacts.    Evelyn [name] is a 41 y.o. female who presents to the Emergency Department complaining of moderate, episodic skin infection located at the inner right leg onset two days ago with associated symptoms of swelling at the skin infection site. The pt informs the EDP that there has been a knot located at the right inner thigh which has been becoming inflamed over the past year. Pt has a hx of abdominal hysterectomy, allergy to morphine, allergy to Vicodin, allergy to tylenol.     History reviewed. No pertinent past medical history.  Past Surgical History  Procedure Date  . Abdominal hysterectomy     No family history on file.  History  Substance Use Topics  . Smoking status: Never Smoker   . Smokeless tobacco: Not on file  . Alcohol Use: No    OB History    Grav Para Term Preterm Abortions TAB SAB Ect Mult Living                  Review of Systems  Constitutional: Negative for fever.    Gastrointestinal: Negative for vomiting and diarrhea.  All other systems reviewed and are negative.    10 Systems reviewed and all are negative for acute change except as noted in the HPI.    Allergies  Morphine and related; Tylenol; and Vicodin  Home Medications   Current Outpatient Rx  Name Route Sig Dispense Refill  . AZO-STANDARD PO Oral Take 1 tablet by mouth 3 (three) times daily.      BP 118/62  Pulse 87  Temp 98.5 F (36.9 C) (Oral)  Resp 18  SpO2 98%  Physical Exam  Nursing note and vitals reviewed. Constitutional: She is oriented to person, place, and time. She appears well-developed and well-nourished. No distress.  HENT:  Head: Normocephalic and atraumatic.  Nose: Nose normal.  Eyes: Pupils are equal, round, and reactive to light.  Neck: Normal range of motion.  Cardiovascular: Normal rate and intact distal pulses.   Pulmonary/Chest: Effort normal. No respiratory distress.  Abdominal: Normal appearance. She exhibits no distension.  Genitourinary:     Musculoskeletal: Normal range of motion.  Neurological: She is alert and oriented to person, place, and time. No cranial nerve deficit.  Skin: Skin is warm and dry. No rash noted.  Psychiatric: She has a normal mood and affect. Her behavior is normal.    ED Course  INCISION AND DRAINAGE Date/Time: 07/10/2012 12:32 PM Performed by: Nelia Shi Authorized by: Nelva Nay  L Consent: Verbal consent obtained. Risks and benefits: risks, benefits and alternatives were discussed Consent given by: patient Patient understanding: patient states understanding of the procedure being performed Patient identity confirmed: verbally with patient Time out: Immediately prior to procedure a "time out" was called to verify the correct patient, procedure, equipment, support staff and site/side marked as required. Type: cyst Body area: anogenital Location details: Bartholin's gland Local anesthetic: lidocaine 2%  with epinephrine Anesthetic total: 2 ml Patient sedated: no Scalpel size: 11 Incision type: single straight Complexity: simple Drainage: purulent Drainage amount: copious Packing material: 1/2 in iodoform gauze Patient tolerance: Patient tolerated the procedure well with no immediate complications.   (including critical care time)  DIAGNOSTIC STUDIES: Oxygen Saturation is 98% on room air, normal by my interpretation.    COORDINATION OF CARE:    12:14PM- EDP at bedside discusses treatment plan concerning opening the boil.  12:18PM- EDP at bedside to perform lancing procedure of the boil.   Labs Reviewed - No data to display No results found.   1. Bartholin cyst       MDM         I personally performed the services described in this documentation, which was scribed in my presence. The recorded information has been reviewed and considered.    Nelia Shi, MD 07/10/12 1234  Nelia Shi, MD 07/10/12 [phone]

## 2012-07-10 NOTE — ED Notes (Signed)
Patient reports she has a boil on her right labia for 2 days that is painful and swollen

## 2012-08-01 ENCOUNTER — Ambulatory Visit: Payer: Self-pay | Admitting: Obstetrics & Gynecology

## 2012-09-06 ENCOUNTER — Ambulatory Visit: Payer: Self-pay | Admitting: Obstetrics & Gynecology

## 2013-06-22 ENCOUNTER — Encounter (HOSPITAL_COMMUNITY): Payer: Self-pay | Admitting: Emergency Medicine

## 2013-06-22 ENCOUNTER — Emergency Department (INDEPENDENT_AMBULATORY_CARE_PROVIDER_SITE_OTHER)
Admission: EM | Admit: 2013-06-22 | Discharge: 2013-06-22 | Disposition: A | Discharge status: Nursing Home (Includes ICF) | Payer: BC Managed Care – PPO | Source: Home / Self Care | Attending: Emergency Medicine | Admitting: Emergency Medicine

## 2013-06-22 ENCOUNTER — Emergency Department (HOSPITAL_COMMUNITY): Payer: BC Managed Care – PPO

## 2013-06-22 ENCOUNTER — Emergency Department (HOSPITAL_COMMUNITY)
Admission: EM | Admit: 2013-06-22 | Discharge: 2013-06-22 | Disposition: A | Discharge status: Home / Self Care | Payer: BC Managed Care – PPO | Attending: Emergency Medicine | Admitting: Emergency Medicine

## 2013-06-22 DIAGNOSIS — R079 Chest pain, unspecified: Secondary | ICD-10-CM

## 2013-06-22 DIAGNOSIS — R0789 Other chest pain: Secondary | ICD-10-CM

## 2013-06-22 DIAGNOSIS — R071 Chest pain on breathing: Secondary | ICD-10-CM | POA: Insufficient documentation

## 2013-06-22 DIAGNOSIS — R0602 Shortness of breath: Secondary | ICD-10-CM

## 2013-06-22 LAB — POCT I-STAT, CHEM 8
BUN: 8 mg/dL (ref 6–23)
Calcium, Ion: 1.2 mmol/L (ref 1.12–1.23)
Chloride: 106 mEq/L (ref 96–112)
Creatinine, Ser: 0.8 mg/dL (ref 0.50–1.10)
Glucose, Bld: 84 mg/dL (ref 70–99)
HCT: 34 % — ABNORMAL LOW (ref 36.0–46.0)
Hemoglobin: 11.6 g/dL — ABNORMAL LOW (ref 12.0–15.0)
Potassium: 3.7 mEq/L (ref 3.5–5.1)
Sodium: 141 mEq/L (ref 135–145)
TCO2: 25 mmol/L (ref 0–100)

## 2013-06-22 LAB — POCT I-STAT TROPONIN I: Troponin i, poc: 0 ng/mL (ref 0.00–0.08)

## 2013-06-22 LAB — CBC WITH DIFFERENTIAL/PLATELET
Basophils Absolute: 0 10*3/uL (ref 0.0–0.1)
Eosinophils Relative: 1 % (ref 0–5)
Lymphocytes Relative: 35 % (ref 12–46)
Neutro Abs: 2.3 10*3/uL (ref 1.7–7.7)
Neutrophils Relative %: 59 % (ref 43–77)
Platelets: 154 10*3/uL (ref 150–400)
RDW: 16.2 % — ABNORMAL HIGH (ref 11.5–15.5)
WBC: 4 10*3/uL (ref 4.0–10.5)

## 2013-06-22 LAB — D-DIMER, QUANTITATIVE: D-Dimer, Quant: 0.76 ug/mL-FEU — ABNORMAL HIGH (ref 0.00–0.48)

## 2013-06-22 MED ORDER — NITROGLYCERIN 0.4 MG SL SUBL
SUBLINGUAL_TABLET | SUBLINGUAL | Status: AC
  Filled 2013-06-22: qty 25

## 2013-06-22 MED ORDER — KETOROLAC TROMETHAMINE 30 MG/ML IJ SOLN
30.0000 mg | Freq: Once | INTRAMUSCULAR | Status: AC
  Administered 2013-06-22: 30 mg via INTRAVENOUS
  Filled 2013-06-22: qty 1

## 2013-06-22 MED ORDER — IOHEXOL 350 MG/ML SOLN
100.0000 mL | Freq: Once | INTRAVENOUS | Status: AC | PRN
  Administered 2013-06-22: 100 mL via INTRAVENOUS

## 2013-06-22 MED ORDER — ASPIRIN 81 MG PO CHEW
CHEWABLE_TABLET | ORAL | Status: AC
  Filled 2013-06-22: qty 4

## 2013-06-22 MED ORDER — ASPIRIN 81 MG PO CHEW
324.0000 mg | CHEWABLE_TABLET | Freq: Once | ORAL | Status: AC
  Administered 2013-06-22: 324 mg via ORAL

## 2013-06-22 MED ORDER — METHOCARBAMOL 500 MG PO TABS: 500.0000 mg | ORAL_TABLET | Freq: Two times a day (BID) | ORAL | Status: DC

## 2013-06-22 MED ORDER — NITROGLYCERIN 0.4 MG SL SUBL
0.4000 mg | SUBLINGUAL_TABLET | SUBLINGUAL | Status: DC | PRN
  Administered 2013-06-22: 0.4 mg via SUBLINGUAL

## 2013-06-22 MED ORDER — SODIUM CHLORIDE 0.9 % IV SOLN
Freq: Once | INTRAVENOUS | Status: AC
  Administered 2013-06-22: 17:00:00 via INTRAVENOUS

## 2013-06-22 MED ORDER — IBUPROFEN 800 MG PO TABS: 800.0000 mg | ORAL_TABLET | Freq: Three times a day (TID) | ORAL | Status: DC

## 2013-06-22 NOTE — ED Provider Notes (Signed)
History    CSN: [id number] Arrival date & time 06/22/13  1723  First MD Initiated Contact with Patient 06/22/13 1738     Chief Complaint  Patient presents with  . Chest Pain   (Consider location/radiation/quality/duration/timing/severity/associated sxs/prior Treatment) HPI  42 year old female with no significant past medical history presents complaining of chest pain and shortness of breath. Patient was sent from urgent care.patient reports while at church she developed acute onset of sharp pleuritic midsternal chest pain. Pain does radiates to her back and up to her right shoulder. Pain worsening with taking deep breath and improved with taking small breath. She has never expressed this pain before. No specific treatment tried. No complaints of fever, chills, cough, hemoptysis, lightheadedness, dizziness, nausea, vomiting, diarrhea, diaphoresis, abdominal pain, dysuria , or rash. Patient is a nonsmoker. No significant history of cardiac disease. No family history of premature cardiac death. No history of DVT or PE. No significant risk factors for PE including no hormone use, birth control pills, recent surgery, prolonged bed rest. Patient however complained that her legs has been swollen for the past week.  States swelling is waxing waning and has resolved.  patient also denies any strenuous exercise or any exertional chest pain.  History reviewed. No pertinent past medical history. Past Surgical History  Procedure Laterality Date  . Abdominal hysterectomy     No family history on file. History  Substance Use Topics  . Smoking status: Never Smoker   . Smokeless tobacco: Not on file  . Alcohol Use: No   OB History   Grav Para Term Preterm Abortions TAB SAB Ect Mult Living                 Review of Systems  All other systems reviewed and are negative.    Allergies  Morphine and related; Tylenol; and Vicodin  Home Medications   Current Outpatient Rx  Name  Route  Sig   Dispense  Refill  . ibuprofen (ADVIL,MOTRIN) 800 MG tablet   Oral   Take 800 mg by mouth every 8 (eight) hours as needed for pain.          There were no vitals taken for this visit. Physical Exam  Nursing note and vitals reviewed. Constitutional: She appears well-developed and well-nourished. No distress.  Awake, alert, nontoxic appearance. Moderately obese.  HENT:  Head: Atraumatic.  Mouth/Throat: Oropharynx is clear and moist.  Eyes: Conjunctivae are normal. Right eye exhibits no discharge. Left eye exhibits no discharge.  Neck: Neck supple.  Cardiovascular: Normal rate, regular rhythm and intact distal pulses.   Pulmonary/Chest: Effort normal. No respiratory distress (shallow breathing, without accessory muscle use). She has no wheezes. She has no rales. She exhibits tenderness (Tenderness to sternal region and upper mid chest on palpation without crepitus, deformity, emphysema, or overlying skin changes.).  Abdominal: Soft. There is no tenderness. There is no rebound.  Musculoskeletal: She exhibits no edema and no tenderness.  ROM appears intact, no obvious focal weakness  Neurological: She is alert.  Mental status and motor strength appears intact  Skin: No rash noted.  Psychiatric: She has a normal mood and affect.    ED Course  Procedures (including critical care time)   Date: 06/22/2013  Rate: 67  Rhythm: normal sinus rhythm  QRS Axis: normal  Intervals: normal and PR prolonged  ST/T Wave abnormalities: nonspecific ST/T changes  Conduction Disutrbances:first-degree A-V block   Narrative Interpretation:   Old EKG Reviewed: unchanged    5:58 PM  Patient with pleuritic chest pain. Pain is reproducible on exam. Pain is atypical for cardiac ischemic chest pain. She also has no significant risk factor for PE. However patient was sent here from urgent care with concerns for NSTEMI.  Workup initiated including d-dimer.  7:46 PM Pt has elevated d-dimer of 0.76.  CXR  unremarkable, will obtain chest CT angio to r/o PE.  11:06 PM Patient's labs are unremarkable. CT angio chest shows no evidence of PE or any other acute finding. Patient felt better after receiving pain medication. Patient states yesterday she remembered catching a person from passing out while at church yesterday.  No specific pain at that time but she think her chest wall pain may be from it.  I recommend pt to f/u with PCP for further care.  Recommend taking OTC ibuprofen for pain.  A list of resource given.  Return precaution discussed.     Labs Reviewed  CBC WITH DIFFERENTIAL - Abnormal; Notable for the following:    Hemoglobin 10.9 (*)    HCT 33.2 (*)    MCV 77.0 (*)    MCH 25.3 (*)    RDW 16.2 (*)    All other components within normal limits  D-DIMER, QUANTITATIVE - Abnormal; Notable for the following:    D-Dimer, Quant 0.76 (*)    All other components within normal limits  POCT I-STAT, CHEM 8 - Abnormal; Notable for the following:    Hemoglobin 11.6 (*)    HCT 34.0 (*)    All other components within normal limits  POCT I-STAT TROPONIN I   Dg Chest 2 View  06/22/2013   *RADIOLOGY REPORT*  Clinical Data: Chest pain for 1 day.  CHEST - 2 VIEW  Comparison:  04/25/2012.  Findings: The heart, mediastinum and hilar contours are normal. The lungs are not well expanded but appear clear except for mild atelectatic changes at the bases.  There are no effusions or pneumothoraces.    IMPRESSION: Atelectasis at the bases.  No acute findings.   Original Report Authenticated By: Sander Radon, M.D.   Ct Angio Chest Pe W/cm &/or Wo Cm  06/22/2013   *RADIOLOGY REPORT*  Clinical Data: Left side chest pain and shortness of breath.  CT ANGIOGRAPHY CHEST  Technique:  Multidetector CT imaging of the chest using the standard protocol during bolus administration of intravenous contrast. Multiplanar reconstructed images including MIPs were obtained and reviewed to evaluate the vascular anatomy.   Contrast: OMNIPAQUE IOHEXOL 350 MG/ML SOLN  Comparison: CT chest 12/31/2011 and PA and lateral chest earlier this same date.  Findings: No pulmonary embolus is identified.  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  No pleural or pericardial effusion is identified.  There is no axillary, hilar or mediastinal lymphadenopathy.  The lungs demonstrate mild dependent basilar atelectasis.  Incidentally imaged upper abdomen is unremarkable.  No focal bony abnormality is identified.  IMPRESSION: Negative for pulmonary embolus or acute disease.   Original Report Authenticated By: Holley Dexter, M.D.   1. Chest wall pain     MDM  BP 134/79  Pulse 65  Temp(Src) 98 F (36.7 C) (Oral)  Resp 18  SpO2 99%  I have reviewed nursing notes and vital signs. I personally reviewed the imaging tests through PACS system  I reviewed available ER/hospitalization records thought the EMR      Fayrene Helper, [place] 06/23/13 0036

## 2013-06-22 NOTE — ED Notes (Signed)
Patient transported to X-ray 

## 2013-06-22 NOTE — ED Provider Notes (Signed)
History    CSN: [id number] Arrival date & time 06/22/13  1544  First MD Initiated Contact with Patient 06/22/13 1618     Chief Complaint  Patient presents with  . Chest Pain    right sided chest pain that started today around 12:30 p.m. hx of heart murmur.    (Consider location/radiation/quality/duration/timing/severity/associated sxs/prior Treatment) HPI Comments: 42 year old female presents complaining of 4 hours of squeezing, right sided chest pain that starts on the right side and radiates outward and has been gradually worsening. This is associated with shortness of breath, and deep breathing exacerbates the pain. She also notes that for the past week she has been experiencing significant leg swelling throughout the day that resolves during the nighttime. She denies nausea, vomiting, dizziness, diaphoresis  Patient is a 42 y.o. female presenting with chest pain.  Chest Pain Associated symptoms: shortness of breath   Associated symptoms: no abdominal pain, no cough, no diaphoresis, no dizziness, no fever, no nausea, no palpitations, not vomiting and no weakness    History reviewed. No pertinent past medical history. Past Surgical History  Procedure Laterality Date  . Abdominal hysterectomy     History reviewed. No pertinent family history. History  Substance Use Topics  . Smoking status: Never Smoker   . Smokeless tobacco: Not on file  . Alcohol Use: No   OB History   Grav Para Term Preterm Abortions TAB SAB Ect Mult Living                 Review of Systems  Constitutional: Negative for fever, chills and diaphoresis.  Eyes: Negative for visual disturbance.  Respiratory: Positive for chest tightness and shortness of breath. Negative for cough.   Cardiovascular: Positive for chest pain and leg swelling. Negative for palpitations.  Gastrointestinal: Negative for nausea, vomiting and abdominal pain.  Endocrine: Negative for polydipsia and polyuria.  Genitourinary:  Negative for dysuria, urgency and frequency.  Musculoskeletal: Negative for myalgias and arthralgias.  Skin: Negative for rash.  Neurological: Negative for dizziness, weakness and light-headedness.    Allergies  Morphine and related; Tylenol; and Vicodin  Home Medications   Current Outpatient Rx  Name  Route  Sig  Dispense  Refill  . Phenazopyridine HCl (AZO-STANDARD PO)   Oral   Take 1 tablet by mouth 3 (three) times daily.          BP 124/85  Pulse 74  Temp(Src) 98 F (36.7 C) (Oral)  Resp 16  SpO2 100% Physical Exam  Constitutional: She is oriented to person, place, and time. She appears well-developed and well-nourished. No distress.  HENT:  Head: Normocephalic and atraumatic.  Cardiovascular: Normal rate, regular rhythm and normal heart sounds.  Exam reveals no gallop and no friction rub.   No murmur heard. Pulmonary/Chest: Breath sounds normal. Tachypnea noted. She has no wheezes. She has no rhonchi. She has no rales.  Neurological: She is alert and oriented to person, place, and time.  Skin: Skin is warm and dry. No rash noted.  Psychiatric: She has a normal mood and affect. Judgment normal.    ED Course  Procedures (including critical care time) Labs Reviewed - No data to display No results found. 1. Chest pain   2. SOB (shortness of breath)     MDM  The EKG is unremarkable.  Given the nature and the features of this pain, this patient needs rule out of NSTEMI. She is transferred to the emergency department via [facility].  Graylon Good, PA-C 06/22/13  1634 

## 2013-06-22 NOTE — ED Notes (Signed)
Pt present c/o right sided chest pain that started today around 12:30 pm.  Pt states that she has a hx of heart murmur.  And a family hx of heart problem Pain constant worse with movement. Denies nausea and vomiting.

## 2013-06-22 NOTE — ED Notes (Addendum)
Pt to ED via [facility] from Urgent Care.  Pt presents with right sided chest pain that started 4 hours ago, pain is worse with movement and when pt takes a deep breath.  Pt given 324 ASA and 1 Nitro prior to arrival.  NSR on monitor, IV in place.  Pt transferred to ED for further evaluation.

## 2013-06-22 NOTE — ED Notes (Signed)
Patient reports to sam, rn for [facility]

## 2013-06-22 NOTE — ED Provider Notes (Signed)
Medical screening examination/treatment/procedure(s) were conducted as a shared visit with non-physician practitioner(s) and myself.  I personally evaluated the patient during the encounter  Pt with atypical CP for cardiac, associated with deep breath, sharp, episodic.  Pt with similar visit to the ED on 12/31/11, had elevated ddimer and neg CT chest at that time.  Seems similar to today.  Pt denies overt stress, bu is caring for 4 children and has been difficult.  Abd is soft, no tenderness.  ECG shows no acute ischemia, no sig change from priors.  ddimer is elevated, will get CT to r/o sig effusion or PE.  Doubt dissection.  Likely atypical CP and can be followed up as outpt.    Gavin Pound. Oletta Lamas, MD 06/22/13 2109

## 2013-06-23 NOTE — ED Provider Notes (Signed)
Medical screening examination/treatment/procedure(s) were performed by non-physician practitioner and as supervising physician I was immediately available for consultation/collaboration.  Calandra Madura, M.D.  Aarush Stukey C Louvina Cleary, MD 06/23/13 2151 

## 2013-08-31 ENCOUNTER — Encounter (HOSPITAL_BASED_OUTPATIENT_CLINIC_OR_DEPARTMENT_OTHER): Payer: Self-pay | Admitting: Emergency Medicine

## 2013-08-31 ENCOUNTER — Emergency Department (HOSPITAL_BASED_OUTPATIENT_CLINIC_OR_DEPARTMENT_OTHER)
Admission: EM | Admit: 2013-08-31 | Discharge: 2013-09-01 | Disposition: A | Discharge status: Home / Self Care | Payer: BC Managed Care – PPO | Attending: Emergency Medicine | Admitting: Emergency Medicine

## 2013-08-31 DIAGNOSIS — S61209A Unspecified open wound of unspecified finger without damage to nail, initial encounter: Secondary | ICD-10-CM | POA: Insufficient documentation

## 2013-08-31 DIAGNOSIS — Y9389 Activity, other specified: Secondary | ICD-10-CM | POA: Insufficient documentation

## 2013-08-31 DIAGNOSIS — Y929 Unspecified place or not applicable: Secondary | ICD-10-CM | POA: Insufficient documentation

## 2013-08-31 DIAGNOSIS — W278XXA Contact with other nonpowered hand tool, initial encounter: Secondary | ICD-10-CM | POA: Insufficient documentation

## 2013-08-31 DIAGNOSIS — IMO0002 Reserved for concepts with insufficient information to code with codable children: Secondary | ICD-10-CM

## 2013-08-31 NOTE — ED Notes (Signed)
Pt has laceration to left first finger yesterday evening.  Some bleeding and pain.

## 2013-08-31 NOTE — ED Provider Notes (Signed)
CSN: [id number]     Arrival date & time 08/31/13  2210 History  This chart was scribed for Aariel Ems Smitty Cords, MD by Caryn Bee, ED Scribe. This patient was seen in room MH12/MH12 and the patient's care was started 12:05 AM.   Chief Complaint  Patient presents with  . Extremity Laceration   Patient is a 42 y.o. female presenting with skin laceration. The history is provided by the patient.  Laceration Location:  Hand Hand laceration location:  L finger Depth:  Cutaneous Quality: straight   Bleeding: controlled   Time since incident:  36 hours Laceration mechanism:  Metal edge Pain details:    Quality:  Aching   Severity:  Mild   Timing:  Constant   Progression:  Unchanged Foreign body present:  No foreign bodies Relieved by:  Nothing Worsened by:  Nothing tried Ineffective treatments:  None tried  HPI Comments: Evelyn [name] is a 42 y.o. female who presents to the Emergency Department complaining of laceration that occurred over one day ago.Pt states she was using scissors when the laceration occured. Patient's tetanus is up to date. Pt's bleeding is controlled. Pt denies numbness. She also denies allergies to any medications.    History reviewed. No pertinent past medical history. Past Surgical History  Procedure Laterality Date  . Abdominal hysterectomy     History reviewed. No pertinent family history. History  Substance Use Topics  . Smoking status: Never Smoker   . Smokeless tobacco: Not on file  . Alcohol Use: No   OB History   Grav Para Term Preterm Abortions TAB SAB Ect Mult Living                 Review of Systems  Skin: Positive for wound.  All other systems reviewed and are negative.    Allergies  Morphine and related; Tylenol; and Vicodin  Home Medications   Current Outpatient Rx  Name  Route  Sig  Dispense  Refill  . ibuprofen (ADVIL,MOTRIN) 800 MG tablet   Oral   Take 1 tablet (800 mg total) by mouth 3 (three) times daily.   21  tablet   0    BP 131/83  Pulse 85  Temp(Src) 98.8 F (37.1 C) (Oral)  Resp 16  Ht 5\' 7"  (1.702 m)  Wt 286 lb (129.729 kg)  BMI 44.78 kg/m2  SpO2 100% Physical Exam  Nursing note and vitals reviewed. Constitutional: She is oriented to person, place, and time. She appears well-developed and well-nourished.  HENT:  Head: Normocephalic and atraumatic.  Eyes: Conjunctivae and EOM are normal.  Neck: Normal range of motion. Neck supple.  Cardiovascular: Normal rate, regular rhythm and normal heart sounds.   Pulmonary/Chest: Effort normal and breath sounds normal. No respiratory distress. She has no wheezes. She has no rales.  Abdominal: Soft. Bowel sounds are normal. There is no tenderness.  Musculoskeletal: Normal range of motion.  Neurological: She is alert and oriented to person, place, and time.  Skin: Skin is warm and dry.  1 cm laceration to the palmar aspect of the tuft of the left index finger. It is hemastatic.   Psychiatric: She has a normal mood and affect.    ED Course  Procedures (including critical care time) DIAGNOSTIC STUDIES: Oxygen Saturation is 100% on room air, normal by my interpretation.    COORDINATION OF CARE: 12:05 AM-Discussed treatment plan with pt at bedside and pt agreed to plan.   Labs Review Labs Reviewed - No data to  display Imaging Review No results found.  MDM  No diagnosis found. Over 24 hours, cannot be closed, wound care and antibiotics.  Return for fevers, drainage redness and streaking   I personally performed the services described in this documentation, which was scribed in my presence. The recorded information has been reviewed and is accurate.     Jasmine Awe, MD 09/01/13 [phone]

## 2013-09-01 MED ORDER — CEPHALEXIN 250 MG PO CAPS: 250.0000 mg | ORAL_CAPSULE | Freq: Four times a day (QID) | ORAL | Status: DC

## 2013-09-01 NOTE — ED Notes (Signed)
Pt ambulating independently w/ steady gait on d/c in no acute distress, A&Ox4. D/c instructions reviewed w/ pt, pt denies any further questions or concerns at present. Rx given x1  

## 2015-09-21 ENCOUNTER — Encounter (HOSPITAL_BASED_OUTPATIENT_CLINIC_OR_DEPARTMENT_OTHER): Payer: Self-pay | Admitting: Emergency Medicine

## 2015-09-21 ENCOUNTER — Emergency Department (HOSPITAL_BASED_OUTPATIENT_CLINIC_OR_DEPARTMENT_OTHER): Payer: Medicaid Other

## 2015-09-21 ENCOUNTER — Emergency Department (HOSPITAL_BASED_OUTPATIENT_CLINIC_OR_DEPARTMENT_OTHER)
Admission: EM | Admit: 2015-09-21 | Discharge: 2015-09-21 | Disposition: A | Discharge status: Home / Self Care | Payer: Medicaid Other | Attending: Emergency Medicine | Admitting: Emergency Medicine

## 2015-09-21 DIAGNOSIS — Z792 Long term (current) use of antibiotics: Secondary | ICD-10-CM | POA: Diagnosis not present

## 2015-09-21 DIAGNOSIS — R1013 Epigastric pain: Secondary | ICD-10-CM | POA: Insufficient documentation

## 2015-09-21 DIAGNOSIS — R1011 Right upper quadrant pain: Secondary | ICD-10-CM | POA: Insufficient documentation

## 2015-09-21 DIAGNOSIS — Z791 Long term (current) use of non-steroidal anti-inflammatories (NSAID): Secondary | ICD-10-CM | POA: Diagnosis not present

## 2015-09-21 LAB — URINALYSIS, ROUTINE W REFLEX MICROSCOPIC
Bilirubin Urine: NEGATIVE
GLUCOSE, UA: NEGATIVE mg/dL
Hgb urine dipstick: NEGATIVE
KETONES UR: NEGATIVE mg/dL
LEUKOCYTES UA: NEGATIVE
Nitrite: NEGATIVE
PH: 5.5 (ref 5.0–8.0)
Protein, ur: NEGATIVE mg/dL
SPECIFIC GRAVITY, URINE: 1.02 (ref 1.005–1.030)
Urobilinogen, UA: 1 mg/dL (ref 0.0–1.0)

## 2015-09-21 LAB — COMPREHENSIVE METABOLIC PANEL
ALT: 14 U/L (ref 14–54)
ANION GAP: 6 (ref 5–15)
AST: 16 U/L (ref 15–41)
Albumin: 3.4 g/dL — ABNORMAL LOW (ref 3.5–5.0)
Alkaline Phosphatase: 63 U/L (ref 38–126)
BILIRUBIN TOTAL: 0.6 mg/dL (ref 0.3–1.2)
BUN: 14 mg/dL (ref 6–20)
CALCIUM: 9.1 mg/dL (ref 8.9–10.3)
CO2: 29 mmol/L (ref 22–32)
Chloride: 102 mmol/L (ref 101–111)
Creatinine, Ser: 0.93 mg/dL (ref 0.44–1.00)
Glucose, Bld: 95 mg/dL (ref 65–99)
POTASSIUM: 4.2 mmol/L (ref 3.5–5.1)
Sodium: 137 mmol/L (ref 135–145)
TOTAL PROTEIN: 7.5 g/dL (ref 6.5–8.1)

## 2015-09-21 LAB — CBC WITH DIFFERENTIAL/PLATELET
BASOS PCT: 0 %
Basophils Absolute: 0 10*3/uL (ref 0.0–0.1)
Eosinophils Absolute: 0 10*3/uL (ref 0.0–0.7)
Eosinophils Relative: 1 %
HEMATOCRIT: 36 % (ref 36.0–46.0)
Hemoglobin: 11.8 g/dL — ABNORMAL LOW (ref 12.0–15.0)
LYMPHS ABS: 1.6 10*3/uL (ref 0.7–4.0)
LYMPHS PCT: 39 %
MCH: 26.6 pg (ref 26.0–34.0)
MCHC: 32.8 g/dL (ref 30.0–36.0)
MCV: 81.3 fL (ref 78.0–100.0)
MONO ABS: 0.4 10*3/uL (ref 0.1–1.0)
MONOS PCT: 9 %
NEUTROS ABS: 2.1 10*3/uL (ref 1.7–7.7)
Neutrophils Relative %: 51 %
Platelets: 150 10*3/uL (ref 150–400)
RBC: 4.43 MIL/uL (ref 3.87–5.11)
RDW: 15.2 % (ref 11.5–15.5)
WBC: 4 10*3/uL (ref 4.0–10.5)

## 2015-09-21 LAB — LIPASE, BLOOD: LIPASE: 23 U/L (ref 22–51)

## 2015-09-21 MED ORDER — SODIUM CHLORIDE 0.9 % IV SOLN
Freq: Once | INTRAVENOUS | Status: AC
  Administered 2015-09-21: 19:00:00 via INTRAVENOUS

## 2015-09-21 MED ORDER — HYDROMORPHONE HCL 1 MG/ML IJ SOLN
1.0000 mg | Freq: Once | INTRAMUSCULAR | Status: AC
  Administered 2015-09-21: 1 mg via INTRAVENOUS
  Filled 2015-09-21: qty 1

## 2015-09-21 MED ORDER — GI COCKTAIL ~~LOC~~
30.0000 mL | Freq: Once | ORAL | Status: AC
  Administered 2015-09-21: 30 mL via ORAL
  Filled 2015-09-21: qty 30

## 2015-09-21 MED ORDER — ONDANSETRON HCL 4 MG/2ML IJ SOLN
4.0000 mg | Freq: Once | INTRAMUSCULAR | Status: AC
  Administered 2015-09-21: 4 mg via INTRAVENOUS
  Filled 2015-09-21: qty 2

## 2015-09-21 MED ORDER — OMEPRAZOLE 20 MG PO CPDR: 20.0000 mg | DELAYED_RELEASE_CAPSULE | Freq: Every day | ORAL | Status: DC

## 2015-09-21 NOTE — ED Notes (Addendum)
Pt c/o of abdominal pain x1day. Upper abdominal pain, woke up from a nap with pain. Pt states she hasn't taken anything for the pain. Denies N/V/D. Reports BM at 3:30pm, normal bathroom tendencies.

## 2015-09-21 NOTE — ED Notes (Signed)
Pt waiting on ride at this time.  

## 2015-09-21 NOTE — Discharge Instructions (Signed)
Please follow-up with your primary care physician in 3-4 days for further management and repeat exam. Return without fail for worsening symptoms including fever, worsening pain, vomiting unable to keep down food or fluids, or any other symptoms concerning to you. Pain may be associated with ulcer today. Take medications as prescribed.  Abdominal Pain Many things can cause abdominal pain. Usually, abdominal pain is not caused by a disease and will improve without treatment. It can often be observed and treated at home. Your health care provider will do a physical exam and possibly order blood tests and X-rays to help determine the seriousness of your pain. However, in many cases, more time must pass before a clear cause of the pain can be found. Before that point, your health care provider may not know if you need more testing or further treatment. HOME CARE INSTRUCTIONS  Monitor your abdominal pain for any changes. The following actions may help to alleviate any discomfort you are experiencing:  Only take over-the-counter or prescription medicines as directed by your health care provider.  Do not take laxatives unless directed to do so by your health care provider.  Try a clear liquid diet (broth, tea, or water) as directed by your health care provider. Slowly move to a bland diet as tolerated. SEEK MEDICAL CARE IF:  You have unexplained abdominal pain.  You have abdominal pain associated with nausea or diarrhea.  You have pain when you urinate or have a bowel movement.  You experience abdominal pain that wakes you in the night.  You have abdominal pain that is worsened or improved by eating food.  You have abdominal pain that is worsened with eating fatty foods.  You have a fever. SEEK IMMEDIATE MEDICAL CARE IF:   Your pain does not go away within 2 hours.  You keep throwing up (vomiting).  Your pain is felt only in portions of the abdomen, such as the right side or the left lower  portion of the abdomen.  You pass bloody or black tarry stools. MAKE SURE YOU:  Understand these instructions.   Will watch your condition.   Will get help right away if you are not doing well or get worse.  Document Released: 09/20/2005 Document Revised: 12/16/2013 Document Reviewed: 08/20/2013 [facility] Patient Information 2015 [place], [place]. This information is not intended to replace advice given to you by your health care provider. Make sure you discuss any questions you have with your health care provider.

## 2015-09-21 NOTE — ED Provider Notes (Signed)
CSN: [id number]     Arrival date & time 09/21/15  1724 History  By signing my name below, I, Meriel Pica, attest that this documentation has been prepared under the direction and in the presence of Forde Dandy, MD. Electronically Signed: Meriel Pica, ED Scribe. 09/21/2015. 8:11 PM.   Chief Complaint  Patient presents with  . Abdominal Pain   The history is provided by the patient. No language interpreter was used.   HPI Comments: Evelyn [name] is a 44 y.o. female, with a PShx of abdominal hysterectomy, who presents to the Emergency Department complaining of a sudden onset, constant, sharp, epigastric > RUQ  abdominal pain onset 1 day ago with waking up from a nap. Pt reports an allergic reaction to an unknown factor yesterday. After this reaction she took a benadryl and went to sleep but woke up around 1 PM yesterday with the abdominal pain. Her pain was constant throughout the day and evening yesterday and continued today, prompting evaluation. The pt was seen by her PCP prior to this visit today who advised her to come to the ED to be evaluated for pancreatitis or cholelithiasis. She reports mild, intermittent labored breathing with thsi pain but denies SOB. Her pain is worse after eating but she has been able to eat meals today. She denies nausea, vomiting, diarrhea, urinary symptoms, or fevers. The pt additionally denies having abdominal pain after meals prior to onset of her current pain.   History reviewed. No pertinent past medical history. Past Surgical History  Procedure Laterality Date  . Abdominal hysterectomy     History reviewed. No pertinent family history. Social History  Substance Use Topics  . Smoking status: Never Smoker   . Smokeless tobacco: None  . Alcohol Use: No   OB History    No data available     Review of Systems  Constitutional: Negative for fever.  Gastrointestinal: Positive for abdominal pain. Negative for nausea, vomiting and diarrhea.   Genitourinary: Negative for dysuria, urgency, frequency and hematuria.  All other systems reviewed and are negative.  Allergies  Morphine and related; Tylenol; and Vicodin  Home Medications   Prior to Admission medications   Medication Sig Start Date End Date Taking? Authorizing Provider  cephALEXin (KEFLEX) 250 MG capsule Take 1 capsule (250 mg total) by mouth 4 (four) times daily. 09/01/13   April Palumbo, MD  ibuprofen (ADVIL,MOTRIN) 800 MG tablet Take 1 tablet (800 mg total) by mouth 3 (three) times daily. 06/22/13   Domenic Moras, PA-C  omeprazole (PRILOSEC) 20 MG capsule Take 1 capsule (20 mg total) by mouth daily. 09/21/15   Forde Dandy, MD   BP 127/82 mmHg  Pulse 80  Temp(Src) 98.4 F (36.9 C) (Oral)  Resp 20  Ht 5' 7.5" (1.715 m)  Wt 336 lb (152.409 kg)  BMI 51.82 kg/m2  SpO2 100% Physical Exam Physical Exam  Nursing note and vitals reviewed. Constitutional: Well developed, well nourished, non-toxic, and in no acute distress Head: Normocephalic and atraumatic.  Mouth/Throat: Oropharynx is clear and moist.  Neck: Normal range of motion. Neck supple.  Cardiovascular: Normal rate and regular rhythm.   Pulmonary/Chest: Effort normal and breath sounds normal.  Abdominal: Soft. Obese, non-distended. Epigastric and RUQ tenderness, no tenderness at McBurney's point. There is no rebound and no guarding.  Musculoskeletal: Normal range of motion.  Neurological: Alert, no facial droop, fluent speech, moves all extremities symmetrically Skin: Skin is warm and dry.  Psychiatric: Cooperative  ED Course  Procedures  DIAGNOSTIC STUDIES: Oxygen Saturation is 100% on RA, normal by my interpretation.    COORDINATION OF CARE: 6:09 PM Discussed treatment plan with pt at bedside and pt agreed to plan.   Labs Review Labs Reviewed  URINALYSIS, ROUTINE W REFLEX MICROSCOPIC (NOT AT [facility]) - Abnormal; Notable for the following:    APPearance CLOUDY (*)    All other components within normal  limits  CBC WITH DIFFERENTIAL/PLATELET - Abnormal; Notable for the following:    Hemoglobin 11.8 (*)    All other components within normal limits  COMPREHENSIVE METABOLIC PANEL - Abnormal; Notable for the following:    Albumin 3.4 (*)    All other components within normal limits  LIPASE, BLOOD    Imaging Review US Abdomen Complete  09/21/2015   CLINICAL DATA:  Sharp right upper quadrant pain  EXAM: ULTRASOUND ABDOMEN COMPLETE  COMPARISON:  None.  FINDINGS: Gallbladder: No gallstones or wall thickening visualized. No sonographic Murphy sign noted.  Common bile duct: Diameter: 1 mm  Liver: No focal lesion identified. Increased hepatic parenchymal echogenicity.  IVC: No abnormality visualized.  Pancreas: Visualized portion unremarkable.  Spleen: Size and appearance within normal limits.  Right Kidney: Length: 11.5 cm. Echogenicity within normal limits. No mass or hydronephrosis visualized.  Left Kidney: Length: 12.1 cm. Echogenicity within normal limits. No mass or hydronephrosis visualized.  Abdominal aorta: No aneurysm visualized.  Other findings: None.  IMPRESSION: 1. No cholelithiasis or sonographic evidence of acute cholecystitis. 2. Increased hepatic echogenicity as can be seen with hepatic steatosis.   Electronically Signed   By: Kathreen Devoid   On: 09/21/2015 20:07   I have personally reviewed and evaluated these images and lab results as part of my medical decision-making.   MDM   Final diagnoses:  RUQ pain  Epigastric abdominal pain    In short, this a 44 year old female with history of abdominal hysterectomy who presents with one day of persistent epigastric and right upper quadrant abdominal pain. She is well-appearing in no acute distress on presentation. Vital signs are overall non-concerning. Abdomen is soft on exam, with focal right upper quadrant and epigastric tenderness to palpation. No peritoneal signs noted. Remainder of exam is unremarkable. Basic blood work including CBC,  comprehensive metabolic profile, lipase are unremarkable. Right upper quadrant ultrasound also performed showing no evidence of hepatobiliary disease. Given that symptoms may worsen with eating, this may be gastritis versus peptic ulcer disease. Started on a course of PPI, and she will follow up closely with her PCP.  I personally performed the services described in this documentation, which was scribed in my presence. The recorded information has been reviewed and is accurate.    Forde Dandy, MD 09/21/15 2121

## 2015-09-21 NOTE — ED Notes (Signed)
Abdominal pain since yesterday. Pain is constant and feels like pressure. Was seen by her MD today and he sent her here to r/o gallstones vs pancreatitis.

## 2016-03-14 ENCOUNTER — Encounter (HOSPITAL_COMMUNITY): Payer: Self-pay | Admitting: Emergency Medicine

## 2016-03-14 DIAGNOSIS — J111 Influenza due to unidentified influenza virus with other respiratory manifestations: Secondary | ICD-10-CM | POA: Insufficient documentation

## 2016-03-14 DIAGNOSIS — Z791 Long term (current) use of non-steroidal anti-inflammatories (NSAID): Secondary | ICD-10-CM | POA: Insufficient documentation

## 2016-03-14 DIAGNOSIS — R05 Cough: Secondary | ICD-10-CM | POA: Diagnosis present

## 2016-03-14 LAB — BASIC METABOLIC PANEL
ANION GAP: 10 (ref 5–15)
BUN: 13 mg/dL (ref 6–20)
CHLORIDE: 105 mmol/L (ref 101–111)
CO2: 22 mmol/L (ref 22–32)
Calcium: 9.4 mg/dL (ref 8.9–10.3)
Creatinine, Ser: 0.87 mg/dL (ref 0.44–1.00)
GFR calc non Af Amer: >60 mL/min (ref >60)
Glucose, Bld: 102 mg/dL — ABNORMAL HIGH (ref 65–99)
POTASSIUM: 3.9 mmol/L (ref 3.5–5.1)
SODIUM: 137 mmol/L (ref 135–145)

## 2016-03-14 LAB — CBC
HEMATOCRIT: 36.1 % (ref 36.0–46.0)
HEMOGLOBIN: 11.8 g/dL — AB (ref 12.0–15.0)
MCH: 26 pg (ref 26.0–34.0)
MCHC: 32.7 g/dL (ref 30.0–36.0)
MCV: 79.5 fL (ref 78.0–100.0)
Platelets: 155 10*3/uL (ref 150–400)
RBC: 4.54 MIL/uL (ref 3.87–5.11)
RDW: 15.5 % (ref 11.5–15.5)
WBC: 4.4 10*3/uL (ref 4.0–10.5)

## 2016-03-14 MED ORDER — IBUPROFEN 800 MG PO TABS
800.0000 mg | ORAL_TABLET | Freq: Once | ORAL | Status: AC
  Administered 2016-03-14: 800 mg via ORAL
  Filled 2016-03-14: qty 1

## 2016-03-14 NOTE — ED Notes (Signed)
Pt c/o cough, sore throat, dizzy, weakness since yesterday.

## 2016-03-15 ENCOUNTER — Emergency Department (HOSPITAL_COMMUNITY)
Admission: EM | Admit: 2016-03-15 | Discharge: 2016-03-15 | Disposition: A | Discharge status: Home / Self Care | Payer: Medicaid Other | Attending: Emergency Medicine | Admitting: Emergency Medicine

## 2016-03-15 ENCOUNTER — Emergency Department (HOSPITAL_COMMUNITY): Payer: Medicaid Other

## 2016-03-15 DIAGNOSIS — J111 Influenza due to unidentified influenza virus with other respiratory manifestations: Secondary | ICD-10-CM

## 2016-03-15 MED ORDER — NAPROXEN 500 MG PO TABS: 500.0000 mg | ORAL_TABLET | Freq: Two times a day (BID) | ORAL | Status: DC | PRN

## 2016-03-15 MED ORDER — OSELTAMIVIR PHOSPHATE 75 MG PO CAPS: 75.0000 mg | ORAL_CAPSULE | Freq: Two times a day (BID) | ORAL | Status: DC

## 2016-03-15 MED ORDER — ONDANSETRON 4 MG PO TBDP: 4.0000 mg | ORAL_TABLET | Freq: Once | ORAL | Status: DC

## 2016-03-15 MED ORDER — ONDANSETRON 4 MG PO TBDP
4.0000 mg | ORAL_TABLET | Freq: Once | ORAL | Status: AC
  Administered 2016-03-15: 4 mg via ORAL
  Filled 2016-03-15: qty 1

## 2016-03-15 MED ORDER — BENZONATATE 100 MG PO CAPS
200.0000 mg | ORAL_CAPSULE | Freq: Once | ORAL | Status: AC
  Administered 2016-03-15: 200 mg via ORAL
  Filled 2016-03-15: qty 2

## 2016-03-15 NOTE — ED Notes (Signed)
Provided patient a cup of juice for PO challenge.

## 2016-03-15 NOTE — Discharge Instructions (Signed)
We think what you have is a viral syndrome or possibly a flu- the treatment for which is symptomatic relief only, and your body will fight the infection off in a few days. We are prescribing you some meds for pain and fevers. Tamiflu needs to be started within 48 hours of symptoms to be hrlpful - again, it doesn't cure flu, just shortens the duration of your symptoms by 1 day.  Please return to the ER if your symptoms worsen; you have increased pain, fevers, chills, inability to keep any medications down, confusion, severe headaches, neck stiffness. Otherwise see the outpatient doctor as requested.   Influenza, Adult Influenza ("the flu") is a viral infection of the respiratory tract. It occurs more often in winter months because people spend more time in close contact with one another. Influenza can make you feel very sick. Influenza easily spreads from person to person (contagious). CAUSES  Influenza is caused by a virus that infects the respiratory tract. You can catch the virus by breathing in droplets from an infected person's cough or sneeze. You can also catch the virus by touching something that was recently contaminated with the virus and then touching your mouth, nose, or eyes. RISKS AND COMPLICATIONS You may be at risk for a more severe case of influenza if you smoke cigarettes, have diabetes, have chronic heart disease (such as heart failure) or lung disease (such as asthma), or if you have a weakened immune system. Elderly people and pregnant women are also at risk for more serious infections. The most common problem of influenza is a lung infection (pneumonia). Sometimes, this problem can require emergency medical care and may be life threatening. SIGNS AND SYMPTOMS  Symptoms typically last 4 to 10 days and may include:  Fever.  Chills.  Headache, body aches, and muscle aches.  Sore throat.  Chest discomfort and cough.  Poor appetite.  Weakness or feeling  tired.  Dizziness.  Nausea or vomiting. DIAGNOSIS  Diagnosis of influenza is often made based on your history and a physical exam. A nose or throat swab test can be done to confirm the diagnosis. TREATMENT  In mild cases, influenza goes away on its own. Treatment is directed at relieving symptoms. For more severe cases, your health care provider may prescribe antiviral medicines to shorten the sickness. Antibiotic medicines are not effective because the infection is caused by a virus, not by bacteria. HOME CARE INSTRUCTIONS  Take medicines only as directed by your health care provider.  Use a cool mist humidifier to make breathing easier.  Get plenty of rest until your temperature returns to normal. This usually takes 3 to 4 days.  Drink enough fluid to keep your urine clear or pale yellow.  Cover yourmouth and nosewhen coughing or sneezing,and wash your handswellto prevent thevirusfrom spreading.  Stay homefromwork orschool untilthe fever is gonefor at least 23full day. PREVENTION  An annual influenza vaccination (flu shot) is the best way to avoid getting influenza. An annual flu shot is now routinely recommended for all adults in the [place] IF:  You experiencechest pain, yourcough worsens,or you producemore mucus.  Youhave nausea,vomiting, ordiarrhea.  Your fever returns or gets worse. SEEK IMMEDIATE MEDICAL CARE IF:  You havetrouble breathing, you become short of breath,or your skin ornails becomebluish.  You have severe painor stiffnessin the neck.  You develop a sudden headache, or pain in the face or ear.  You have nausea or vomiting that you cannot control. MAKE SURE YOU:  Understand these instructions.  Will watch your condition.  Will get help right away if you are not doing well or get worse.   This information is not intended to replace advice given to you by your health care provider. Make sure you discuss any  questions you have with your health care provider.   Document Released: 12/08/2000 Document Revised: 01/01/2015 Document Reviewed: 03/11/2012 Elsevier Interactive Patient Education [employer].

## 2016-03-15 NOTE — ED Provider Notes (Signed)
CSN: [id number]     Arrival date & time 03/14/16  1809 History  By signing my name below, I, Nicole Kindred, attest that this documentation has been prepared under the direction and in the presence of Varney Biles, MD.   Electronically Signed: Nicole Kindred, ED Scribe. 03/15/2016. 2:29 AM   Chief Complaint  Patient presents with  . Cough  . Sore Throat    The history is provided by the patient. No language interpreter was used.   HPI Comments: Evelyn [name] is a 45 y.o. female who presents to the Emergency Department complaining of gradual onset, constant sore throat, onset one day ago. Pt reports associated productive cough with white and yellow phlegm, chest pain with coughing, dizziness, weakness, diaphoresis, wheezing, some difficulty breathing, moderate improving headache, chills, body aches, and rhinorrhea. Pt has taken ibuprofen PTA with minimal relief to symptoms. Pt states sick contact with her god daughter who has similar symptoms. Pt denies photophobia, neck pain, or any other pertinent symptoms.   History reviewed. No pertinent past medical history. Past Surgical History  Procedure Laterality Date  . Abdominal hysterectomy     No family history on file. Social History  Substance Use Topics  . Smoking status: Never Smoker   . Smokeless tobacco: None  . Alcohol Use: No   OB History    No data available     Review of Systems  Constitutional: Positive for diaphoresis. Negative for chills.  HENT: Positive for sore throat.   Eyes: Negative for photophobia.  Respiratory: Positive for cough, shortness of breath and wheezing.   Cardiovascular: Positive for chest pain.       Chest pain resulting from cough.   Musculoskeletal: Positive for myalgias. Negative for neck pain.  Neurological: Positive for headaches.  All other systems reviewed and are negative.   Allergies  Dilaudid; Morphine and related; Tylenol; and Vicodin  Home Medications   Prior to Admission  medications   Medication Sig Start Date End Date Taking? Authorizing Provider  ibuprofen (ADVIL,MOTRIN) 800 MG tablet Take 1 tablet (800 mg total) by mouth 3 (three) times daily. 06/22/13  Yes Domenic Moras, PA-C  naproxen (NAPROSYN) 500 MG tablet Take 1 tablet (500 mg total) by mouth 2 (two) times daily as needed for moderate pain. 03/15/16   Varney Biles, MD  omeprazole (PRILOSEC) 20 MG capsule Take 1 capsule (20 mg total) by mouth daily. Patient not taking: Reported on 03/15/2016 09/21/15   Forde Dandy, MD  ondansetron (ZOFRAN-ODT) 4 MG disintegrating tablet Take 1 tablet (4 mg total) by mouth once. 03/15/16   Varney Biles, MD  oseltamivir (TAMIFLU) 75 MG capsule Take 1 capsule (75 mg total) by mouth every 12 (twelve) hours. 03/15/16   Levita Monical, MD   BP 115/77 mmHg  Pulse 108  Temp(Src) 103 F (39.4 C) (Oral)  Resp 18  SpO2 100% Physical Exam  Constitutional: She is oriented to person, place, and time. She appears well-developed and well-nourished. No distress.  HENT:  Head: Normocephalic and atraumatic.  Eyes: EOM are normal.  Neck: Normal range of motion.  Mild cervical lymphadenopathy.  No meningismus appreciated.   Cardiovascular: Normal rate, regular rhythm and normal heart sounds.   HR is 84.  Pulmonary/Chest: Effort normal and breath sounds normal. No respiratory distress. She has no wheezes. She has no rales.  Abdominal: Soft. She exhibits no distension. There is no tenderness.  Musculoskeletal: Normal range of motion.  Lymphadenopathy:    She has cervical adenopathy.  Neurological:  She is alert and oriented to person, place, and time.  Skin: Skin is warm and dry.  Psychiatric: She has a normal mood and affect. Judgment normal.  Nursing note and vitals reviewed.   ED Course  Procedures (including critical care time) DIAGNOSTIC STUDIES: Oxygen Saturation is 100% on RA, normal by my interpretation.    COORDINATION OF CARE: 1:25 AM-Discussed treatment plan which  includes BMP, CBC, urinalysis, symptom monitoring, and ibuprofen with pt at bedside and pt agreed to plan.   Labs Review Labs Reviewed  BASIC METABOLIC PANEL - Abnormal; Notable for the following:    Glucose, Bld 102 (*)    All other components within normal limits  CBC - Abnormal; Notable for the following:    Hemoglobin 11.8 (*)    All other components within normal limits  URINALYSIS, ROUTINE W REFLEX MICROSCOPIC (NOT AT [facility])    Imaging Review Dg Chest 2 View  03/15/2016  CLINICAL DATA:  45 year old female with productive cough and fever EXAM: CHEST  2 VIEW COMPARISON:  Chest CT dated 06/22/2013 FINDINGS: The heart size and mediastinal contours are within normal limits. Both lungs are clear. The visualized skeletal structures are unremarkable. IMPRESSION: No active cardiopulmonary disease. Electronically Signed   By: Anner Crete M.D.   On: 03/15/2016 01:46   I have personally reviewed and evaluated these images and lab results as part of my medical decision-making.   EKG Interpretation None      MDM   Final diagnoses:  Flu    I personally performed the services described in this documentation, which was scribed in my presence. The recorded information has been reviewed and is accurate.  PT is a healthy woman who comes in with URI like symptoms. She also has a mild headache and dizziness when she gets up. With the headaches, there is no visual complains, photophobia, seizures, altered mental status, loss of consciousness, new focal weakness, or numbness, no gait instability and no neck stiffness. We dont suspect meningitis.   Will treat as flu. Fever was high - CBC is normal, pt is non toxic, and tachycardia and fever resolved with oral meds and pt has passed oral challenge.  Strict ER return precautions have been discussed, and patient is agreeing with the plan and is comfortable with the workup done and the recommendations from the ER.\    Varney Biles,  MD 03/15/16 [phone]

## 2016-05-12 ENCOUNTER — Encounter (HOSPITAL_BASED_OUTPATIENT_CLINIC_OR_DEPARTMENT_OTHER): Payer: Self-pay | Admitting: *Deleted

## 2016-05-12 ENCOUNTER — Emergency Department (HOSPITAL_BASED_OUTPATIENT_CLINIC_OR_DEPARTMENT_OTHER)
Admission: EM | Admit: 2016-05-12 | Discharge: 2016-05-12 | Disposition: A | Discharge status: Home / Self Care | Payer: Medicaid Other | Attending: Emergency Medicine | Admitting: Emergency Medicine

## 2016-05-12 ENCOUNTER — Emergency Department (HOSPITAL_BASED_OUTPATIENT_CLINIC_OR_DEPARTMENT_OTHER): Payer: Medicaid Other

## 2016-05-12 DIAGNOSIS — Z791 Long term (current) use of non-steroidal anti-inflammatories (NSAID): Secondary | ICD-10-CM | POA: Insufficient documentation

## 2016-05-12 DIAGNOSIS — Y9389 Activity, other specified: Secondary | ICD-10-CM | POA: Insufficient documentation

## 2016-05-12 DIAGNOSIS — Y999 Unspecified external cause status: Secondary | ICD-10-CM | POA: Diagnosis not present

## 2016-05-12 DIAGNOSIS — S39012A Strain of muscle, fascia and tendon of lower back, initial encounter: Secondary | ICD-10-CM | POA: Insufficient documentation

## 2016-05-12 DIAGNOSIS — S3992XA Unspecified injury of lower back, initial encounter: Secondary | ICD-10-CM | POA: Diagnosis present

## 2016-05-12 DIAGNOSIS — Y92410 Unspecified street and highway as the place of occurrence of the external cause: Secondary | ICD-10-CM | POA: Diagnosis not present

## 2016-05-12 MED ORDER — CYCLOBENZAPRINE HCL 10 MG PO TABS: 10.0000 mg | ORAL_TABLET | Freq: Two times a day (BID) | ORAL | Status: DC | PRN

## 2016-05-12 MED ORDER — NAPROXEN 500 MG PO TABS: 500.0000 mg | ORAL_TABLET | Freq: Two times a day (BID) | ORAL | Status: DC
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
## 2016-05-12 NOTE — Discharge Instructions (Signed)
Take the Naprosyn and Flexeril as needed for the low back strain. Return for any new or worse symptoms. Would expect improvement over the next couple days.
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
## 2016-11-09 DIAGNOSIS — F331 Major depressive disorder, recurrent, moderate: Secondary | ICD-10-CM | POA: Insufficient documentation

## 2016-12-08 ENCOUNTER — Other Ambulatory Visit: Payer: Self-pay | Admitting: *Deleted

## 2016-12-08 DIAGNOSIS — Z1231 Encounter for screening mammogram for malignant neoplasm of breast: Secondary | ICD-10-CM

## 2016-12-11 ENCOUNTER — Ambulatory Visit: Payer: Medicaid Other

## 2016-12-14 ENCOUNTER — Ambulatory Visit
Admission: RE | Admit: 2016-12-14 | Discharge: 2016-12-14 | Disposition: A | Discharge status: Home / Self Care | Payer: Medicaid Other | Source: Ambulatory Visit | Attending: *Deleted | Admitting: *Deleted

## 2016-12-14 DIAGNOSIS — Z1231 Encounter for screening mammogram for malignant neoplasm of breast: Secondary | ICD-10-CM

## 2016-12-21 ENCOUNTER — Other Ambulatory Visit: Payer: Self-pay | Admitting: *Deleted

## 2016-12-21 DIAGNOSIS — R928 Other abnormal and inconclusive findings on diagnostic imaging of breast: Secondary | ICD-10-CM

## 2016-12-29 ENCOUNTER — Ambulatory Visit
Admission: RE | Admit: 2016-12-29 | Discharge: 2016-12-29 | Disposition: A | Discharge status: Home / Self Care | Payer: Medicaid Other | Source: Ambulatory Visit | Attending: *Deleted | Admitting: *Deleted

## 2016-12-29 DIAGNOSIS — R928 Other abnormal and inconclusive findings on diagnostic imaging of breast: Secondary | ICD-10-CM

## 2017-01-16 ENCOUNTER — Emergency Department (HOSPITAL_COMMUNITY)
Admission: EM | Admit: 2017-01-16 | Discharge: 2017-01-16 | Disposition: A | Discharge status: Home / Self Care | Payer: Medicaid Other | Attending: Emergency Medicine | Admitting: Emergency Medicine

## 2017-01-16 ENCOUNTER — Encounter (HOSPITAL_COMMUNITY): Payer: Self-pay

## 2017-01-16 DIAGNOSIS — N764 Abscess of vulva: Secondary | ICD-10-CM | POA: Diagnosis not present

## 2017-01-16 DIAGNOSIS — R7303 Prediabetes: Secondary | ICD-10-CM | POA: Insufficient documentation

## 2017-01-16 DIAGNOSIS — Z79899 Other long term (current) drug therapy: Secondary | ICD-10-CM | POA: Insufficient documentation

## 2017-01-16 DIAGNOSIS — M519 Unspecified thoracic, thoracolumbar and lumbosacral intervertebral disc disorder: Secondary | ICD-10-CM | POA: Insufficient documentation

## 2017-01-16 DIAGNOSIS — L723 Sebaceous cyst: Secondary | ICD-10-CM | POA: Insufficient documentation

## 2017-01-16 DIAGNOSIS — E7849 Other hyperlipidemia: Secondary | ICD-10-CM | POA: Insufficient documentation

## 2017-01-16 DIAGNOSIS — G4733 Obstructive sleep apnea (adult) (pediatric): Secondary | ICD-10-CM | POA: Insufficient documentation

## 2017-01-16 MED ORDER — DOXYCYCLINE HYCLATE 100 MG PO CAPS: 100.0000 mg | ORAL_CAPSULE | Freq: Two times a day (BID) | ORAL | 0 refills | Status: DC

## 2017-01-16 MED ORDER — OXYCODONE HCL 5 MG PO TABS: 5.0000 mg | ORAL_TABLET | ORAL | 0 refills | Status: DC | PRN

## 2017-01-16 NOTE — ED Provider Notes (Signed)
[name] DEPT Provider Note   CSN: [id number] Arrival date & time: 01/16/17  1211  By signing my name below, I, Neta Mends, attest that this documentation has been prepared under the direction and in the presence of Tanna Furry, MD . Electronically Signed: Neta Mends, ED Scribe. 01/16/2017. 4:00 PM.    History   Chief Complaint Chief Complaint  Patient presents with  . Abscess    The history is provided by the patient. No language interpreter was used.   HPI Comments: Evelyn [name] is a 46 y.o. female who presents to the Emergency Department complaining of a moderate, gradually worsening area of pain and swelling to the inner labia x 2 days. Pt states pain is exacerbated with palpation and direct pressure, and states that the area is hard. Pt reports previous similar abscesses to the same area for which she has had I&Ds. No alleviating factors noted. Pt denies other associated symptoms.   History reviewed. No pertinent past medical history.  There are no active problems to display for this patient.   Past Surgical History:  Procedure Laterality Date  . ABDOMINAL HYSTERECTOMY    . BREAST LUMPECTOMY Left 1997  . THROAT SURGERY     removed scar tissue from throat d/t being intubated.   . TUBAL LIGATION      OB History    No data available       Home Medications    Prior to Admission medications   Medication Sig Start Date End Date Taking? Authorizing Provider  ciclopirox (PENLAC) 8 % solution Apply 1 application topically at bedtime. To nail & surrounding skin over previous coat: Remove after 7 days with alcohol and repeat cycle 12/26/16 03/26/17 Yes Historical Provider, MD  cyclobenzaprine (FLEXERIL) 10 MG tablet Take 1 tablet (10 mg total) by mouth 2 (two) times daily as needed for muscle spasms. 05/12/16  Yes Fredia Sorrow, MD  DULoxetine (CYMBALTA) 30 MG capsule Take 30 mg by mouth every morning.   Yes Historical Provider, MD  phentermine 37.5 MG  capsule Take 37.5 mg by mouth every morning.   Yes Historical Provider, MD  doxycycline (VIBRAMYCIN) 100 MG capsule Take 1 capsule (100 mg total) by mouth 2 (two) times daily. 01/16/17   Tanna Furry, MD  ibuprofen (ADVIL,MOTRIN) 800 MG tablet Take 1 tablet (800 mg total) by mouth 3 (three) times daily. Patient not taking: Reported on 01/16/2017 06/22/13   Domenic Moras, PA-C  naproxen (NAPROSYN) 500 MG tablet Take 1 tablet (500 mg total) by mouth 2 (two) times daily as needed for moderate pain. Patient not taking: Reported on 01/16/2017 03/15/16   Varney Biles, MD  naproxen (NAPROSYN) 500 MG tablet Take 1 tablet (500 mg total) by mouth 2 (two) times daily. Patient not taking: Reported on 01/16/2017 05/12/16   Fredia Sorrow, MD  omeprazole (PRILOSEC) 20 MG capsule Take 1 capsule (20 mg total) by mouth daily. Patient not taking: Reported on 01/16/2017 09/21/15   Forde Dandy, MD  ondansetron (ZOFRAN-ODT) 4 MG disintegrating tablet Take 1 tablet (4 mg total) by mouth once. Patient not taking: Reported on 01/16/2017 03/15/16   Varney Biles, MD  oseltamivir (TAMIFLU) 75 MG capsule Take 1 capsule (75 mg total) by mouth every 12 (twelve) hours. Patient not taking: Reported on 01/16/2017 03/15/16   Varney Biles, MD  oxyCODONE (ROXICODONE) 5 MG immediate release tablet Take 1 tablet (5 mg total) by mouth every 4 (four) hours as needed for severe pain. 01/16/17   Elta Guadeloupe  Jeneen Rinks, MD    Family History History reviewed. No pertinent family history.  Social History Social History  Substance Use Topics  . Smoking status: Never Smoker  . Smokeless tobacco: Never Used  . Alcohol use No     Allergies   Dilaudid [hydromorphone hcl]; Morphine and related; Tylenol [acetaminophen]; and Vicodin [hydrocodone-acetaminophen]   Review of Systems Review of Systems  Constitutional: Negative for appetite change, chills, diaphoresis, fatigue and fever.  HENT: Negative for mouth sores, sore throat and trouble swallowing.     Eyes: Negative for visual disturbance.  Respiratory: Negative for cough, chest tightness, shortness of breath and wheezing.   Cardiovascular: Negative for chest pain.  Gastrointestinal: Negative for abdominal distention, abdominal pain, diarrhea, nausea and vomiting.  Endocrine: Negative for polydipsia, polyphagia and polyuria.  Genitourinary: Negative for dysuria, frequency and hematuria.       Positive for abscess.   Musculoskeletal: Negative for gait problem.  Skin: Negative for color change, pallor and rash.  Neurological: Negative for dizziness, syncope, light-headedness and headaches.  Hematological: Does not bruise/bleed easily.  Psychiatric/Behavioral: Negative for behavioral problems and confusion.  All other systems reviewed and are negative.    Physical Exam Updated Vital Signs BP 122/88   Pulse 111   Temp 98.6 F (37 C) (Oral)   Resp 18   Ht 5\' 7"  (1.702 m)   Wt (!) 343 lb (155.6 kg)   SpO2 100%   BMI 53.72 kg/m   Physical Exam  Constitutional: She is oriented to person, place, and time. She appears well-developed and well-nourished. No distress.  HENT:  Head: Normocephalic.  Eyes: Conjunctivae are normal. Pupils are equal, round, and reactive to light. No scleral icterus.  Neck: Normal range of motion. Neck supple. No thyromegaly present.  Cardiovascular: Normal rate and regular rhythm.  Exam reveals no gallop and no friction rub.   No murmur heard. Pulmonary/Chest: Effort normal and breath sounds normal. No respiratory distress. She has no wheezes. She has no rales.  Abdominal: Soft. Bowel sounds are normal. She exhibits no distension. There is no tenderness. There is no rebound.  Genitourinary:  Genitourinary Comments: At 10:00 position on the right labia there is an area of induration and tenderness. There is a large keratin plug and a draining sinus.  Musculoskeletal: Normal range of motion.  Neurological: She is alert and oriented to person, place, and  time.  Skin: Skin is warm and dry. No rash noted.  Psychiatric: She has a normal mood and affect. Her behavior is normal.     ED Treatments / Results  DIAGNOSTIC STUDIES:  Oxygen Saturation is 100% on RA, normal by my interpretation.    COORDINATION OF CARE:  3:57 PM Will perform I&D if needed. Discussed treatment plan with pt at bedside and pt agreed to plan.   Labs (all labs ordered are listed, but only abnormal results are displayed) Labs Reviewed - No data to display  EKG  EKG Interpretation None       Radiology No results found.  Procedures Procedures (including critical care time)  Medications Ordered in ED Medications - No data to display   Initial Impression / Assessment and Plan / ED Course  I have reviewed the triage vital signs and the nursing notes.  Pertinent labs & imaging results that were available during my care of the patient were reviewed by me and considered in my medical decision making (see chart for details).     This is a sebaceous cyst abscess. Not an  inferior located Bartholin's abscess. Patient desires and presses consent for incision and drainage.  INCISION AND DRAINAGE Performed by: Lolita Patella Consent: Verbal consent obtained. Risks and benefits: risks, benefits and alternatives were discussed Type: abscess  Body area:  Right labia  Anesthesia: local infiltration  Incision was made with a scalpel.  Local anesthetic: lidocaine 1% c epinephrine  Anesthetic total: 4 ml  Complexity: complex Blunt dissection to break up loculations  Drainage: purulent  Drainage amount: small, thin/purulent  Packing material: Cavity was small and would not accommodate Word catheter placement.   Patient tolerance: Patient tolerated the procedure well with no immediate complications.     Final Clinical Impressions(s) / ED Diagnoses   Final diagnoses:  Labial abscess  Sebaceous cyst    Patient encouraged to soak and gently  massage the area. Doxycycline prescription. Vicodin as needed. Encouraged to contact her physician tomorrow for outpatient follow-up to discuss possibility of formal cyst removal.  New Prescriptions New Prescriptions   DOXYCYCLINE (VIBRAMYCIN) 100 MG CAPSULE    Take 1 capsule (100 mg total) by mouth 2 (two) times daily.   OXYCODONE (ROXICODONE) 5 MG IMMEDIATE RELEASE TABLET    Take 1 tablet (5 mg total) by mouth every 4 (four) hours as needed for severe pain.  I personally performed the services described in this documentation, which was scribed in my presence. The recorded information has been reviewed and is accurate.     Tanna Furry, MD 01/16/17 1743

## 2017-01-16 NOTE — Discharge Instructions (Signed)
Call your OB/GYN for follow-up appointment as soon as possible.  Warm soaks with gentle massage 2-3 times per day.

## 2017-01-16 NOTE — ED Triage Notes (Signed)
Onset last night bump on labia, hard and painful.

## 2017-02-14 ENCOUNTER — Ambulatory Visit (INDEPENDENT_AMBULATORY_CARE_PROVIDER_SITE_OTHER): Payer: Medicaid Other | Admitting: Obstetrics and Gynecology

## 2017-02-14 ENCOUNTER — Encounter: Payer: Self-pay | Admitting: Obstetrics and Gynecology

## 2017-02-14 VITALS — BP 128/91 | HR 84 | Wt 327.9 lb

## 2017-02-14 DIAGNOSIS — L723 Sebaceous cyst: Secondary | ICD-10-CM

## 2017-02-14 NOTE — Progress Notes (Signed)
46 yo [id number] presenting today for the evaluation of sebaceous cysts. Patient has had a long standing history of sebaceous cyst since her teenage years. She has had them lanced on 3 different occasions with the last time being on 01/22/2017. Patient is otherwise without complaints. She had had a hysterectomy for symptomatic fibroids. She denies abdominal/pelvic pain or abnormal vaginal discharge  History reviewed. No pertinent past medical history. Past Surgical History:  Procedure Laterality Date  . ABDOMINAL HYSTERECTOMY    . BREAST LUMPECTOMY Left 1997  . THROAT SURGERY     removed scar tissue from throat d/t being intubated.   . TUBAL LIGATION     History reviewed. No pertinent family history. Social History  Substance Use Topics  . Smoking status: Never Smoker  . Smokeless tobacco: Never Used  . Alcohol use No   ROS See pertinent in HPI  Blood pressure (!) 128/91, pulse 84, weight (!) 327 lb 14.4 oz (148.7 kg). GENERAL: Well-developed, well-nourished female in no acute distress.  ABDOMEN: Soft, nontender, nondistended. Obese Incision: healed completely  PELVIC: Normal external female genitalia with bilateral calcified sebaceous cysts; 4 on the left and 2 on on right labia majora. Non tender. Vagina is pink and rugated.  Normal discharge.  EXTREMITIES: No cyanosis, clubbing, or edema, 2+ distal pulses.  A/P 46 yo with multiple calcified sebaceous cysts - No surgical interventions needed at this time - Discussed preventative measures - Patient is being evaluated for bariatrics surgery. I encouraged her to continue her efforts - RTC prn

## 2017-02-14 NOTE — Patient Instructions (Signed)
Epidermal Cyst An epidermal cyst is sometimes called a sebaceous cyst, epidermal inclusion cyst, or infundibular cyst. These cysts usually contain a substance that looks "pasty" or "cheesy" and may have a bad smell. This substance is a protein called keratin. Epidermal cysts are usually found on the face, neck, or trunk. They may also occur in the vaginal area or other parts of the genitalia of both men and women. Epidermal cysts are usually small, painless, slow-growing bumps or lumps that move freely under the skin. It is important not to try to pop them. This may cause an infection and lead to tenderness and swelling. CAUSES  Epidermal cysts may be caused by a deep penetrating injury to the skin or a plugged hair follicle, often associated with acne. SYMPTOMS  Epidermal cysts can become inflamed and cause:  Redness.  Tenderness.  Increased temperature of the skin over the bumps or lumps.  Grayish-white, bad smelling material that drains from the bump or lump. DIAGNOSIS  Epidermal cysts are easily diagnosed by your caregiver during an exam. Rarely, a tissue sample (biopsy) may be taken to rule out other conditions that may resemble epidermal cysts. TREATMENT   Epidermal cysts often get better and disappear on their own. They are rarely ever cancerous.  If a cyst becomes infected, it may become inflamed and tender. This may require opening and draining the cyst. Treatment with antibiotics may be necessary. When the infection is gone, the cyst may be removed with minor surgery.  Small, inflamed cysts can often be treated with antibiotics or by injecting steroid medicines. HOME CARE INSTRUCTIONS  Only take over-the-counter or prescription medicines as directed by your caregiver.  Take your antibiotics as directed. Finish them even if you start to feel better. SEEK MEDICAL CARE IF:   Your cyst becomes tender, red, or swollen.  Your condition is not improving or is getting worse.  You  have any other questions or concerns. MAKE SURE YOU:  Understand these instructions.  Will watch your condition.  Will get help right away if you are not doing well or get worse. This information is not intended to replace advice given to you by your health care provider. Make sure you discuss any questions you have with your health care provider. Document Released: 11/11/2004 Document Revised: 03/04/2012 Document Reviewed: 10/13/2015 Elsevier Interactive Patient Education  2017 [employer].

## 2017-03-19 ENCOUNTER — Encounter: Payer: Medicaid Other | Admitting: Obstetrics & Gynecology

## 2017-03-25 HISTORY — PX: GASTRIC BYPASS: SHX52

## 2017-04-06 DIAGNOSIS — I10 Essential (primary) hypertension: Secondary | ICD-10-CM | POA: Insufficient documentation

## 2017-04-06 DIAGNOSIS — M545 Low back pain, unspecified: Secondary | ICD-10-CM | POA: Insufficient documentation

## 2017-04-06 DIAGNOSIS — G8929 Other chronic pain: Secondary | ICD-10-CM | POA: Insufficient documentation

## 2017-04-16 DIAGNOSIS — E66813 Obesity, class 3: Secondary | ICD-10-CM | POA: Insufficient documentation

## 2017-05-16 LAB — GLUCOSE, POCT (MANUAL RESULT ENTRY): POC GLUCOSE: 92 mg/dL (ref 70–99)

## 2017-05-22 DIAGNOSIS — Z9884 Bariatric surgery status: Secondary | ICD-10-CM | POA: Insufficient documentation

## 2017-05-22 DIAGNOSIS — K912 Postsurgical malabsorption, not elsewhere classified: Secondary | ICD-10-CM | POA: Insufficient documentation

## 2017-08-09 DIAGNOSIS — F419 Anxiety disorder, unspecified: Secondary | ICD-10-CM | POA: Insufficient documentation

## 2017-10-23 DIAGNOSIS — E519 Thiamine deficiency, unspecified: Secondary | ICD-10-CM | POA: Insufficient documentation

## 2017-10-23 DIAGNOSIS — E559 Vitamin D deficiency, unspecified: Secondary | ICD-10-CM | POA: Insufficient documentation

## 2017-12-10 ENCOUNTER — Other Ambulatory Visit: Payer: Self-pay | Admitting: *Deleted

## 2017-12-10 DIAGNOSIS — R921 Mammographic calcification found on diagnostic imaging of breast: Secondary | ICD-10-CM

## 2017-12-27 ENCOUNTER — Emergency Department (HOSPITAL_BASED_OUTPATIENT_CLINIC_OR_DEPARTMENT_OTHER)
Admission: EM | Admit: 2017-12-27 | Discharge: 2017-12-27 | Disposition: A | Discharge status: Home / Self Care | Payer: Medicaid Other | Attending: Emergency Medicine | Admitting: Emergency Medicine

## 2017-12-27 ENCOUNTER — Encounter (HOSPITAL_BASED_OUTPATIENT_CLINIC_OR_DEPARTMENT_OTHER): Payer: Self-pay | Admitting: Emergency Medicine

## 2017-12-27 ENCOUNTER — Emergency Department (HOSPITAL_BASED_OUTPATIENT_CLINIC_OR_DEPARTMENT_OTHER): Payer: Medicaid Other

## 2017-12-27 ENCOUNTER — Other Ambulatory Visit: Payer: Self-pay

## 2017-12-27 DIAGNOSIS — B9789 Other viral agents as the cause of diseases classified elsewhere: Secondary | ICD-10-CM

## 2017-12-27 DIAGNOSIS — J069 Acute upper respiratory infection, unspecified: Secondary | ICD-10-CM | POA: Diagnosis not present

## 2017-12-27 DIAGNOSIS — Z9884 Bariatric surgery status: Secondary | ICD-10-CM | POA: Insufficient documentation

## 2017-12-27 DIAGNOSIS — F329 Major depressive disorder, single episode, unspecified: Secondary | ICD-10-CM | POA: Diagnosis not present

## 2017-12-27 DIAGNOSIS — Z79899 Other long term (current) drug therapy: Secondary | ICD-10-CM | POA: Insufficient documentation

## 2017-12-27 DIAGNOSIS — R05 Cough: Secondary | ICD-10-CM | POA: Diagnosis present

## 2017-12-27 HISTORY — DX: Obesity, unspecified: E66.9

## 2017-12-27 HISTORY — DX: Gastro-esophageal reflux disease without esophagitis: K21.9

## 2017-12-27 HISTORY — DX: Depression, unspecified: F32.A

## 2017-12-27 HISTORY — DX: Major depressive disorder, single episode, unspecified: F32.9

## 2017-12-27 MED ORDER — PROMETHAZINE-DM 6.25-15 MG/5ML PO SYRP: 5.0000 mL | ORAL_SOLUTION | Freq: Four times a day (QID) | ORAL | 0 refills | Status: DC | PRN

## 2017-12-27 MED ORDER — BENZONATATE 100 MG PO CAPS
200.0000 mg | ORAL_CAPSULE | Freq: Once | ORAL | Status: AC
  Administered 2017-12-27: 200 mg via ORAL
  Filled 2017-12-27: qty 2

## 2017-12-27 MED ORDER — BENZONATATE 100 MG PO CAPS: 100.0000 mg | ORAL_CAPSULE | Freq: Three times a day (TID) | ORAL | 0 refills | Status: DC

## 2017-12-27 NOTE — ED Provider Notes (Signed)
[facility] EMERGENCY DEPARTMENT Provider Note   CSN: [id number] Arrival date & time: 12/27/17  1754     History   Chief Complaint Chief Complaint  Patient presents with  . Cough    HPI Evelyn [name] is a 47 y.o. female. CC: cough  HPI 47 year old female. Reports symptoms for 3 days. Cough. Bodyaches. Headache. Sinus congestion. No fever. Mild diffuse bodyaches. Was not immunized for influenza.  No chest pain. No GI complaints.  Past Medical History:  Diagnosis Date  . Depression   . GERD (gastroesophageal reflux disease)   . Obesity     There are no active problems to display for this patient.   Past Surgical History:  Procedure Laterality Date  . ABDOMINAL HYSTERECTOMY    . BREAST LUMPECTOMY Left 1997  . GASTRIC BYPASS    . THROAT SURGERY     removed scar tissue from throat d/t being intubated.   . TUBAL LIGATION      OB History    Gravida Para Term Preterm AB Living   5 3 3   2      SAB TAB Ectopic Multiple Live Births           3       Home Medications    Prior to Admission medications   Medication Sig Start Date End Date Taking? Authorizing Provider  ursodiol (ACTIGALL) 250 MG tablet Take 250 mg by mouth 2 (two) times daily.   Yes [provider]  benzonatate (TESSALON) 100 MG capsule Take 1 capsule (100 mg total) by mouth every 8 (eight) hours. 12/27/17   Tanna Furry, MD  cyclobenzaprine (FLEXERIL) 10 MG tablet Take 1 tablet (10 mg total) by mouth 2 (two) times daily as needed for muscle spasms. Patient not taking: Reported on 02/14/2017 05/12/16   Fredia Sorrow, MD  doxycycline (VIBRAMYCIN) 100 MG capsule Take 1 capsule (100 mg total) by mouth 2 (two) times daily. Patient not taking: Reported on 02/14/2017 01/16/17   Tanna Furry, MD  DULoxetine (CYMBALTA) 30 MG capsule Take 30 mg by mouth every morning.    [provider]  ibuprofen (ADVIL,MOTRIN) 800 MG tablet Take 1 tablet (800 mg total) by mouth 3 (three) times  daily. Patient not taking: Reported on 01/16/2017 06/22/13   Domenic Moras, PA-C  naproxen (NAPROSYN) 500 MG tablet Take 1 tablet (500 mg total) by mouth 2 (two) times daily as needed for moderate pain. Patient not taking: Reported on 01/16/2017 03/15/16   Varney Biles, MD  naproxen (NAPROSYN) 500 MG tablet Take 1 tablet (500 mg total) by mouth 2 (two) times daily. Patient not taking: Reported on 01/16/2017 05/12/16   Fredia Sorrow, MD  omeprazole (PRILOSEC) 20 MG capsule Take 1 capsule (20 mg total) by mouth daily. Patient not taking: Reported on 01/16/2017 09/21/15   Forde Dandy, MD  ondansetron (ZOFRAN-ODT) 4 MG disintegrating tablet Take 1 tablet (4 mg total) by mouth once. Patient not taking: Reported on 01/16/2017 03/15/16   Varney Biles, MD  oseltamivir (TAMIFLU) 75 MG capsule Take 1 capsule (75 mg total) by mouth every 12 (twelve) hours. Patient not taking: Reported on 01/16/2017 03/15/16   Varney Biles, MD  oxyCODONE (ROXICODONE) 5 MG immediate release tablet Take 1 tablet (5 mg total) by mouth every 4 (four) hours as needed for severe pain. Patient not taking: Reported on 02/14/2017 01/16/17   Tanna Furry, MD  phentermine 37.5 MG capsule Take 37.5 mg by mouth every morning.    [provider]  promethazine-dextromethorphan (PROMETHAZINE-DM) 6.25-15 MG/5ML syrup Take 5 mLs by mouth 4 (four) times daily as needed for cough. 12/27/17   Tanna Furry, MD    Family History No family history on file.  Social History Social History   Tobacco Use  . Smoking status: Never Smoker  . Smokeless tobacco: Never Used  Substance Use Topics  . Alcohol use: No  . Drug use: No     Allergies   Dilaudid [hydromorphone hcl]; Morphine and related; and Vicodin [hydrocodone-acetaminophen]   Review of Systems Review of Systems  Constitutional: Negative for appetite change, chills, diaphoresis, fatigue and fever.  HENT: Positive for congestion, postnasal drip, rhinorrhea and sinus pressure.  Negative for mouth sores, sore throat and trouble swallowing.   Eyes: Negative for visual disturbance.  Respiratory: Positive for cough. Negative for chest tightness, shortness of breath and wheezing.   Cardiovascular: Negative for chest pain.  Gastrointestinal: Negative for abdominal distention, abdominal pain, diarrhea, nausea and vomiting.  Endocrine: Negative for polydipsia, polyphagia and polyuria.  Genitourinary: Negative for dysuria, frequency and hematuria.  Musculoskeletal: Negative for gait problem.  Skin: Negative for color change, pallor and rash.  Neurological: Negative for dizziness, syncope, light-headedness and headaches.  Hematological: Does not bruise/bleed easily.  Psychiatric/Behavioral: Negative for behavioral problems and confusion.     Physical Exam Updated Vital Signs BP 135/87 (BP Location: Left Arm)   Pulse 71   Temp 98.7 F (37.1 C) (Oral)   Resp 18   Ht 5\' 7"  (1.702 m)   Wt 108.9 kg (240 lb)   SpO2 97%   BMI 37.59 kg/m   Physical Exam  Constitutional: She is oriented to person, place, and time. She appears well-developed and well-nourished. No distress.  HENT:  Head: Normocephalic.  Nares clear. Posterior pharynx benign. No adenopathy in the neck. Clear lungs.  Eyes: Conjunctivae are normal. Pupils are equal, round, and reactive to light. No scleral icterus.  Neck: Normal range of motion. Neck supple. No thyromegaly present.  Cardiovascular: Normal rate and regular rhythm. Exam reveals no gallop and no friction rub.  No murmur heard. Pulmonary/Chest: Effort normal and breath sounds normal. No respiratory distress. She has no wheezes. She has no rales.  Abdominal: Soft. Bowel sounds are normal. She exhibits no distension. There is no tenderness. There is no rebound.  Musculoskeletal: Normal range of motion.  Neurological: She is alert and oriented to person, place, and time.  Skin: Skin is warm and dry. No rash noted.  Psychiatric: She has a normal  mood and affect. Her behavior is normal.     ED Treatments / Results  Labs (all labs ordered are listed, but only abnormal results are displayed) Labs Reviewed - No data to display  EKG  EKG Interpretation None       Radiology Dg Chest 2 View  Result Date: 12/27/2017 CLINICAL DATA:  Cough, runny nose, sneezing, and body aches for 3 days. Nonsmoker. EXAM: CHEST  2 VIEW COMPARISON:  03/15/2016 FINDINGS: The heart size and mediastinal contours are within normal limits. Both lungs are clear. The visualized skeletal structures are unremarkable. IMPRESSION: No active cardiopulmonary disease. Electronically Signed   By: Lucienne Capers M.D.   On: 12/27/2017 18:44    Procedures Procedures (including critical care time)  Medications Ordered in ED Medications  benzonatate (TESSALON) capsule 200 mg (not administered)     Initial Impression / Assessment and Plan / ED Course  I have reviewed the triage vital signs and the nursing notes.  Pertinent  labs & imaging results that were available during my care of the patient were reviewed by me and considered in my medical decision making (see chart for details).    Not hypoxemic. Not febrile. Well oxygenated. Chest x-ray normal. Likely simple viral upper rest for infection. No indicators for antibiotics or bacterial infection. Plan symptomatically treatment. Rest, fluids, NSAIDs, Tessalon for daytime cough. Phenergan DM for nighttime cough.  Final Clinical Impressions(s) / ED Diagnoses   Final diagnoses:  Viral URI with cough    ED Discharge Orders        Ordered    promethazine-dextromethorphan (PROMETHAZINE-DM) 6.25-15 MG/5ML syrup  4 times daily PRN     12/27/17 2010    benzonatate (TESSALON) 100 MG capsule  Every 8 hours     12/27/17 2010       Tanna Furry, MD 12/27/17 2013

## 2017-12-27 NOTE — Discharge Instructions (Signed)
Tessalon for daytime cough. Phenergan DM for nighttime cough. Rest, push fluids, Motrin for body aches.

## 2017-12-27 NOTE — ED Notes (Signed)
Pt verbalizes understanding of d/c instructions and denies any further needs at this time. 

## 2017-12-27 NOTE — ED Triage Notes (Signed)
Cough, runny nose, sneezing, body aches x3 days.

## 2018-02-08 DIAGNOSIS — M1712 Unilateral primary osteoarthritis, left knee: Secondary | ICD-10-CM | POA: Insufficient documentation

## 2018-04-11 ENCOUNTER — Encounter (HOSPITAL_BASED_OUTPATIENT_CLINIC_OR_DEPARTMENT_OTHER): Payer: Self-pay | Admitting: *Deleted

## 2018-04-11 ENCOUNTER — Emergency Department (HOSPITAL_BASED_OUTPATIENT_CLINIC_OR_DEPARTMENT_OTHER): Payer: Medicaid Other

## 2018-04-11 ENCOUNTER — Emergency Department (HOSPITAL_BASED_OUTPATIENT_CLINIC_OR_DEPARTMENT_OTHER)
Admission: EM | Admit: 2018-04-11 | Discharge: 2018-04-12 | Disposition: A | Discharge status: Home / Self Care | Payer: Medicaid Other | Attending: Emergency Medicine | Admitting: Emergency Medicine

## 2018-04-11 ENCOUNTER — Other Ambulatory Visit: Payer: Self-pay

## 2018-04-11 DIAGNOSIS — D1803 Hemangioma of intra-abdominal structures: Secondary | ICD-10-CM | POA: Insufficient documentation

## 2018-04-11 DIAGNOSIS — R112 Nausea with vomiting, unspecified: Secondary | ICD-10-CM | POA: Diagnosis not present

## 2018-04-11 DIAGNOSIS — R1012 Left upper quadrant pain: Secondary | ICD-10-CM | POA: Diagnosis not present

## 2018-04-11 DIAGNOSIS — Z79899 Other long term (current) drug therapy: Secondary | ICD-10-CM | POA: Insufficient documentation

## 2018-04-11 DIAGNOSIS — R109 Unspecified abdominal pain: Secondary | ICD-10-CM | POA: Diagnosis present

## 2018-04-11 LAB — CBC WITH DIFFERENTIAL/PLATELET
Basophils Absolute: 0 10*3/uL (ref 0.0–0.1)
Basophils Relative: 0 %
Eosinophils Absolute: 0 10*3/uL (ref 0.0–0.7)
Eosinophils Relative: 0 %
HCT: 36.8 % (ref 36.0–46.0)
Hemoglobin: 12.5 g/dL (ref 12.0–15.0)
Lymphocytes Relative: 53 %
Lymphs Abs: 2.5 10*3/uL (ref 0.7–4.0)
MCH: 28 pg (ref 26.0–34.0)
MCHC: 34 g/dL (ref 30.0–36.0)
MCV: 82.5 fL (ref 78.0–100.0)
Monocytes Absolute: 0.3 10*3/uL (ref 0.1–1.0)
Monocytes Relative: 7 %
Neutro Abs: 1.9 10*3/uL (ref 1.7–7.7)
Neutrophils Relative %: 40 %
Platelets: 209 10*3/uL (ref 150–400)
RBC: 4.46 MIL/uL (ref 3.87–5.11)
RDW: 14.1 % (ref 11.5–15.5)
WBC: 4.7 10*3/uL (ref 4.0–10.5)

## 2018-04-11 LAB — COMPREHENSIVE METABOLIC PANEL
ALT: 13 U/L — ABNORMAL LOW (ref 14–54)
AST: 17 U/L (ref 15–41)
Albumin: 3.8 g/dL (ref 3.5–5.0)
Alkaline Phosphatase: 73 U/L (ref 38–126)
Anion gap: 9 (ref 5–15)
BUN: 17 mg/dL (ref 6–20)
CO2: 25 mmol/L (ref 22–32)
Calcium: 9 mg/dL (ref 8.9–10.3)
Chloride: 103 mmol/L (ref 101–111)
Creatinine, Ser: 0.68 mg/dL (ref 0.44–1.00)
GFR calc Af Amer: >60 mL/min (ref >60)
GFR calc non Af Amer: >60 mL/min (ref >60)
Glucose, Bld: 86 mg/dL (ref 65–99)
Potassium: 4.4 mmol/L (ref 3.5–5.1)
Sodium: 137 mmol/L (ref 135–145)
Total Bilirubin: 0.5 mg/dL (ref 0.3–1.2)
Total Protein: 7.5 g/dL (ref 6.5–8.1)

## 2018-04-11 LAB — URINALYSIS, ROUTINE W REFLEX MICROSCOPIC
BILIRUBIN URINE: NEGATIVE
Glucose, UA: NEGATIVE mg/dL
HGB URINE DIPSTICK: NEGATIVE
KETONES UR: NEGATIVE mg/dL
Leukocytes, UA: NEGATIVE
NITRITE: NEGATIVE
PROTEIN: NEGATIVE mg/dL
Specific Gravity, Urine: >1.03 — ABNORMAL HIGH (ref 1.005–1.030)
pH: 5.5 (ref 5.0–8.0)

## 2018-04-11 LAB — LIPASE, BLOOD: Lipase: 24 U/L (ref 11–51)

## 2018-04-11 MED ORDER — FAMOTIDINE IN NACL 20-0.9 MG/50ML-% IV SOLN
20.0000 mg | Freq: Once | INTRAVENOUS | Status: AC
  Administered 2018-04-11: 20 mg via INTRAVENOUS
  Filled 2018-04-11: qty 50

## 2018-04-11 MED ORDER — SODIUM CHLORIDE 0.9 % IV BOLUS
1000.0000 mL | Freq: Once | INTRAVENOUS | Status: AC
  Administered 2018-04-11: 1000 mL via INTRAVENOUS

## 2018-04-11 MED ORDER — ONDANSETRON HCL 4 MG/2ML IJ SOLN
4.0000 mg | Freq: Once | INTRAMUSCULAR | Status: AC
  Administered 2018-04-11: 4 mg via INTRAVENOUS
  Filled 2018-04-11: qty 2

## 2018-04-11 MED ORDER — IOPAMIDOL (ISOVUE-300) INJECTION 61%
100.0000 mL | Freq: Once | INTRAVENOUS | Status: AC | PRN
  Administered 2018-04-11: 100 mL via INTRAVENOUS

## 2018-04-11 NOTE — ED Provider Notes (Signed)
[facility] EMERGENCY DEPARTMENT Provider Note   CSN: [id number] Arrival date & time: 04/11/18  1856     History   Chief Complaint Chief Complaint  Patient presents with  . Abdominal Pain    HPI Evelyn [name] is a 47 y.o. female with history of GERD, obesity, and depression presents today for evaluation of acute onset, progressively worsening abdominal pain.  She states pain has been ongoing for 3 weeks intermittent but now mostly constant in the left upper quadrant and epigastric region.  Pain does not radiate.  Pain occurs after meals initially but is now constant.  On Sunday 5 days ago the patient began developing emesis.  She states that she will vomit immediately after attempting to eat anything.  States that she has not been able to tolerate p.o. food or fluids for a few days.  Denies fevers or chills, no urinary symptoms.  She does note some constipation but had a bowel movement earlier today.  No diarrhea. She denies hematemesis, hematochezia, melena, or hematuria.  Denies vaginal itching, bleeding, or discharge and she is status post total hysterectomy.  She has tried Zofran for her symptoms without significant relief.   The history is provided by the patient.    Past Medical History:  Diagnosis Date  . Depression   . GERD (gastroesophageal reflux disease)   . Obesity     There are no active problems to display for this patient.   Past Surgical History:  Procedure Laterality Date  . ABDOMINAL HYSTERECTOMY    . BREAST LUMPECTOMY Left 1997  . GASTRIC BYPASS    . THROAT SURGERY     removed scar tissue from throat d/t being intubated.   . TUBAL LIGATION       OB History    Gravida  5   Para  3   Term  3   Preterm      AB  2   Living        SAB      TAB      Ectopic      Multiple      Live Births  3            Home Medications    Prior to Admission medications   Medication Sig Start Date End Date Taking? Authorizing Provider    benzonatate (TESSALON) 100 MG capsule Take 1 capsule (100 mg total) by mouth every 8 (eight) hours. 12/27/17   Tanna Furry, MD  cyclobenzaprine (FLEXERIL) 10 MG tablet Take 1 tablet (10 mg total) by mouth 2 (two) times daily as needed for muscle spasms. Patient not taking: Reported on 02/14/2017 05/12/16   Fredia Sorrow, MD  doxycycline (VIBRAMYCIN) 100 MG capsule Take 1 capsule (100 mg total) by mouth 2 (two) times daily. Patient not taking: Reported on 02/14/2017 01/16/17   Tanna Furry, MD  DULoxetine (CYMBALTA) 30 MG capsule Take 30 mg by mouth every morning.    [provider]  ibuprofen (ADVIL,MOTRIN) 800 MG tablet Take 1 tablet (800 mg total) by mouth 3 (three) times daily. Patient not taking: Reported on 01/16/2017 06/22/13   Domenic Moras, PA-C  naproxen (NAPROSYN) 500 MG tablet Take 1 tablet (500 mg total) by mouth 2 (two) times daily as needed for moderate pain. Patient not taking: Reported on 01/16/2017 03/15/16   Varney Biles, MD  naproxen (NAPROSYN) 500 MG tablet Take 1 tablet (500 mg total) by mouth 2 (two) times daily. Patient not taking: Reported on 01/16/2017  05/12/16   Fredia Sorrow, MD  omeprazole (PRILOSEC) 20 MG capsule Take 1 capsule (20 mg total) by mouth daily. Patient not taking: Reported on 01/16/2017 09/21/15   Forde Dandy, MD  ondansetron (ZOFRAN-ODT) 4 MG disintegrating tablet Take 1 tablet (4 mg total) by mouth once. Patient not taking: Reported on 01/16/2017 03/15/16   Varney Biles, MD  oseltamivir (TAMIFLU) 75 MG capsule Take 1 capsule (75 mg total) by mouth every 12 (twelve) hours. Patient not taking: Reported on 01/16/2017 03/15/16   Varney Biles, MD  oxyCODONE (ROXICODONE) 5 MG immediate release tablet Take 1 tablet (5 mg total) by mouth every 4 (four) hours as needed for severe pain. Patient not taking: Reported on 02/14/2017 01/16/17   Tanna Furry, MD  phentermine 37.5 MG capsule Take 37.5 mg by mouth every morning.    [provider]   promethazine-dextromethorphan (PROMETHAZINE-DM) 6.25-15 MG/5ML syrup Take 5 mLs by mouth 4 (four) times daily as needed for cough. 12/27/17   Tanna Furry, MD  ursodiol (ACTIGALL) 250 MG tablet Take 250 mg by mouth 2 (two) times daily.    [provider]    Family History No family history on file.  Social History Social History   Tobacco Use  . Smoking status: Never Smoker  . Smokeless tobacco: Never Used  Substance Use Topics  . Alcohol use: No  . Drug use: No     Allergies   Dilaudid [hydromorphone hcl]; Morphine and related; and Vicodin [hydrocodone-acetaminophen]   Review of Systems Review of Systems  Constitutional: Negative for chills and fever.  Gastrointestinal: Positive for abdominal pain, constipation, nausea and vomiting. Negative for blood in stool and diarrhea.  Genitourinary: Negative for dysuria, hematuria, vaginal bleeding, vaginal discharge and vaginal pain.  All other systems reviewed and are negative.    Physical Exam Updated Vital Signs BP (!) 118/59 (BP Location: Right Arm)   Pulse 66   Temp 98.3 F (36.8 C)   Resp 16   Ht 5\' 7"  (1.702 m)   Wt 108.9 kg (240 lb)   SpO2 100%   BMI 37.59 kg/m   Physical Exam  Constitutional: She appears well-developed and well-nourished. No distress.  HENT:  Head: Normocephalic and atraumatic.  Eyes: Conjunctivae are normal. Right eye exhibits no discharge. Left eye exhibits no discharge.  Neck: No JVD present. No tracheal deviation present.  Cardiovascular: Normal rate, regular rhythm and normal heart sounds.  Pulmonary/Chest: Effort normal and breath sounds normal.  Abdominal: Soft. Bowel sounds are normal. She exhibits no distension. There is tenderness in the epigastric area and left upper quadrant. There is guarding. There is no rigidity, no rebound, no CVA tenderness, no tenderness at McBurney's point and negative Murphy's sign.  Musculoskeletal: She exhibits no edema.  No midline spine TTP, no  paraspinal muscle tenderness, no deformity, crepitus, or step-off noted   Neurological: She is alert.  Skin: Skin is warm and dry. No erythema.  Psychiatric: She has a normal mood and affect. Her behavior is normal.  Nursing note and vitals reviewed.    ED Treatments / Results  Labs (all labs ordered are listed, but only abnormal results are displayed) Labs Reviewed  URINALYSIS, ROUTINE W REFLEX MICROSCOPIC - Abnormal; Notable for the following components:      Result Value   Specific Gravity, Urine >1.030 (*)    All other components within normal limits  COMPREHENSIVE METABOLIC PANEL - Abnormal; Notable for the following components:   ALT 13 (*)    All  other components within normal limits  CBC WITH DIFFERENTIAL/PLATELET  LIPASE, BLOOD    EKG None  Radiology Ct Abdomen Pelvis W Contrast  Result Date: 04/11/2018 CLINICAL DATA:  Umbilical pain, nausea and vomiting x3 weeks. EXAM: CT ABDOMEN AND PELVIS WITH CONTRAST TECHNIQUE: Multidetector CT imaging of the abdomen and pelvis was performed using the standard protocol following bolus administration of intravenous contrast. CONTRAST:  143mL ISOVUE-300 IOPAMIDOL (ISOVUE-300) INJECTION 61% COMPARISON:  09/21/2015 gallbladder ultrasound FINDINGS: Lower chest: No acute abnormality. Hepatobiliary: 1.7 cm left hepatic lobe lesion with peripheral puddling of enhancement compatible with a hemangioma. No biliary dilatation. Tiny 5 mm hypodensity in the right hepatic lobe likely represents a small cyst. No gallbladder wall thickening or calculi. Subtle layering densities in the gallbladder compatible with biliary sludge. No secondary signs of acute cholecystitis. Pancreas: Normal Spleen: Normal Adrenals/Urinary Tract: Adrenal glands are unremarkable. Kidneys are normal, without renal calculi, focal lesion, or hydronephrosis. Bladder is unremarkable. Stomach/Bowel: Status post bariatric surgery. No acute bowel inflammation or obstruction.  Jejunojejunostomy anastomosis is patent. No bowel obstruction. Enteric contrast reaches the cecum. Normal-appearing appendix and large bowel. Vascular/Lymphatic: Minimal aortic atherosclerosis. No aortic aneurysm. No enlarged abdominal or pelvic lymph nodes. Reproductive: Status post hysterectomy. No adnexal masses. Other: No abdominal wall hernia or abnormality. No abdominopelvic ascites. Musculoskeletal: No acute or significant osseous findings. IMPRESSION: 1. Left hepatic hemangioma measuring 1.7 cm. 5 mm right hepatic hypodensity without enhancement likely representing small cyst. This too small further characterize. 2. Mild biliary sludge without secondary signs of acute cholecystitis. 3. Status post bariatric surgery without bowel obstruction or acute inflammation. Electronically Signed   By: Ashley Royalty M.D.   On: 04/11/2018 23:41    Procedures Procedures (including critical care time)  Medications Ordered in ED Medications  ondansetron (ZOFRAN) injection 4 mg (4 mg Intravenous Given 04/11/18 2216)  sodium chloride 0.9 % bolus 1,000 mL (0 mLs Intravenous Stopped 04/12/18 0008)  famotidine (PEPCID) IVPB 20 mg premix (0 mg Intravenous Stopped 04/12/18 0008)  iopamidol (ISOVUE-300) 61 % injection 100 mL (100 mLs Intravenous Contrast Given 04/11/18 2252)     Initial Impression / Assessment and Plan / ED Course  I have reviewed the triage vital signs and the nursing notes.  Pertinent labs & imaging results that were available during my care of the patient were reviewed by me and considered in my medical decision making (see chart for details).     Patient with complaint of progressively worsening left upper quadrant and epigastric postprandial pain with development of postprandial vomiting 5 days ago.  She is afebrile, vital signs are stable.  She is nontoxic in appearance.  Abdomen is soft with no peritoneal signs.  Lab work reviewed by me shows no leukocytosis, no significant electrolyte  abnormalities, LFTs, lipase, and creatinine are all not elevated.  UA is not concerning for UTI or nephrolithiasis.  Will obtain CT scan of the abdomen and pelvis to rule out SBO or other acute abdominal pathology.  CT scan shows an incidental finding of a left hepatic hemangioma and a 5 mm right hepatic hypodensity without enhancement.  She also has mild biliary sludge without secondary signs of acute cholecystitis.  I have a low suspicion of acute cholecystitis in the absence of right upper quadrant pain or elevated LFTs.  No evidence of obstruction, perforation, appendicitis, colitis, dissection, or other acute surgical abdominal pathology.  She is status post total hysterectomy.  She was given IV fluids, Pepcid, and Zofran and on reevaluation she states  she is feeling better.  She is tolerating p.o. fluids without difficulty.  She has not had any vomiting while in the ED today.  Stable for discharge home with instructions to continue her PPI and Zofran.  She tells me she does not require refills of either.  She has a follow-up appointment with her gastroenterologist in 4 days which I encouraged her to keep.  Discussed strict ED return precautions. Pt verbalized understanding of and agreement with plan and is safe for discharge home at this time.   Final Clinical Impressions(s) / ED Diagnoses   Final diagnoses:  Left upper quadrant pain  Non-intractable vomiting with nausea, unspecified vomiting type    ED Discharge Orders    None       Debroah Baller 04/12/18 0042    Tanna Furry, MD 04/12/18 2023

## 2018-04-11 NOTE — ED Triage Notes (Signed)
Abdominal pain x 3 weeks. Vomiting. Hx of gastric surgery last year.

## 2018-04-12 NOTE — Discharge Instructions (Signed)
1. Medications:  Take Zofran as needed for nausea.  Wait around 20 minutes before eating or drinking after taking this medication.  Continue taking your omeprazole.  You may switch to use omeprazole over-the-counter which is Nexium if you wish but do not take both. 2. Treatment: rest, drink plenty of fluids, advance diet slowly.  Start with water and broth then advance to bland foods that will not upset your stomach such as crackers, mashed potatoes, and peanut butter. 3. Follow Up: Please followup with your primary doctor/gastroenterologist in 3 days for discussion of your diagnoses and further evaluation after today's visit. Please return to the ER for persistent vomiting, high fevers or worsening symptoms

## 2018-04-12 NOTE — ED Notes (Signed)
PO challenge started

## 2018-06-12 ENCOUNTER — Other Ambulatory Visit: Payer: Self-pay | Admitting: Physician Assistant

## 2018-06-12 DIAGNOSIS — Z1231 Encounter for screening mammogram for malignant neoplasm of breast: Secondary | ICD-10-CM

## 2018-07-01 ENCOUNTER — Other Ambulatory Visit: Payer: Self-pay | Admitting: Physician Assistant

## 2018-07-01 DIAGNOSIS — R921 Mammographic calcification found on diagnostic imaging of breast: Secondary | ICD-10-CM

## 2019-09-09 ENCOUNTER — Other Ambulatory Visit: Payer: Self-pay | Admitting: Physician Assistant

## 2019-09-09 DIAGNOSIS — R921 Mammographic calcification found on diagnostic imaging of breast: Secondary | ICD-10-CM

## 2019-09-16 ENCOUNTER — Other Ambulatory Visit: Payer: Self-pay

## 2019-09-16 ENCOUNTER — Ambulatory Visit
Admission: RE | Admit: 2019-09-16 | Discharge: 2019-09-16 | Disposition: A | Discharge status: Home / Self Care | Payer: Medicaid Other | Source: Ambulatory Visit | Attending: Physician Assistant | Admitting: Physician Assistant

## 2019-09-16 DIAGNOSIS — R921 Mammographic calcification found on diagnostic imaging of breast: Secondary | ICD-10-CM

## 2019-10-10 ENCOUNTER — Other Ambulatory Visit: Payer: Self-pay

## 2019-10-10 DIAGNOSIS — Z20822 Contact with and (suspected) exposure to covid-19: Secondary | ICD-10-CM

## 2019-10-12 LAB — NOVEL CORONAVIRUS, NAA: SARS-CoV-2, NAA: NOT DETECTED

## 2019-10-13 ENCOUNTER — Telehealth: Payer: Self-pay | Admitting: General Practice

## 2019-10-13 NOTE — Telephone Encounter (Signed)
Negative COVID results given. Patient results "NOT Detected." Caller expressed understanding. ° °

## 2019-12-07 ENCOUNTER — Other Ambulatory Visit: Payer: Self-pay

## 2019-12-07 ENCOUNTER — Encounter (HOSPITAL_BASED_OUTPATIENT_CLINIC_OR_DEPARTMENT_OTHER): Payer: Self-pay | Admitting: *Deleted

## 2019-12-07 ENCOUNTER — Emergency Department (HOSPITAL_BASED_OUTPATIENT_CLINIC_OR_DEPARTMENT_OTHER)
Admission: EM | Admit: 2019-12-07 | Discharge: 2019-12-08 | Disposition: A | Discharge status: Home / Self Care | Payer: Medicaid Other | Attending: Emergency Medicine | Admitting: Emergency Medicine

## 2019-12-07 DIAGNOSIS — Z79899 Other long term (current) drug therapy: Secondary | ICD-10-CM | POA: Insufficient documentation

## 2019-12-07 DIAGNOSIS — R1031 Right lower quadrant pain: Secondary | ICD-10-CM

## 2019-12-07 DIAGNOSIS — Z885 Allergy status to narcotic agent status: Secondary | ICD-10-CM | POA: Insufficient documentation

## 2019-12-07 LAB — CBC WITH DIFFERENTIAL/PLATELET
Abs Immature Granulocytes: 0.02 10*3/uL (ref 0.00–0.07)
Basophils Absolute: 0 10*3/uL (ref 0.0–0.1)
Basophils Relative: 0 %
Eosinophils Absolute: 0.1 10*3/uL (ref 0.0–0.5)
Eosinophils Relative: 1 %
HCT: 32.5 % — ABNORMAL LOW (ref 36.0–46.0)
Hemoglobin: 10.6 g/dL — ABNORMAL LOW (ref 12.0–15.0)
Immature Granulocytes: 0 %
Lymphocytes Relative: 43 %
Lymphs Abs: 2 10*3/uL (ref 0.7–4.0)
MCH: 28.3 pg (ref 26.0–34.0)
MCHC: 32.6 g/dL (ref 30.0–36.0)
MCV: 86.9 fL (ref 80.0–100.0)
Monocytes Absolute: 0.3 10*3/uL (ref 0.1–1.0)
Monocytes Relative: 7 %
Neutro Abs: 2.3 10*3/uL (ref 1.7–7.7)
Neutrophils Relative %: 49 %
Platelets: 207 10*3/uL (ref 150–400)
RBC: 3.74 MIL/uL — ABNORMAL LOW (ref 3.87–5.11)
RDW: 13.3 % (ref 11.5–15.5)
WBC: 4.7 10*3/uL (ref 4.0–10.5)
nRBC: 0 % (ref 0.0–0.2)

## 2019-12-07 LAB — URINALYSIS, ROUTINE W REFLEX MICROSCOPIC
Bilirubin Urine: NEGATIVE
Glucose, UA: NEGATIVE mg/dL
Hgb urine dipstick: NEGATIVE
Ketones, ur: NEGATIVE mg/dL
Leukocytes,Ua: NEGATIVE
Nitrite: NEGATIVE
Protein, ur: NEGATIVE mg/dL
Specific Gravity, Urine: >1.03 — ABNORMAL HIGH (ref 1.005–1.030)
pH: 5.5 (ref 5.0–8.0)

## 2019-12-07 MED ORDER — ONDANSETRON 4 MG PO TBDP
4.0000 mg | ORAL_TABLET | Freq: Once | ORAL | Status: AC
  Administered 2019-12-07: 4 mg via ORAL
  Filled 2019-12-07: qty 1

## 2019-12-07 MED ORDER — KETOROLAC TROMETHAMINE 30 MG/ML IJ SOLN
30.0000 mg | Freq: Once | INTRAMUSCULAR | Status: AC
  Administered 2019-12-07: 30 mg via INTRAMUSCULAR
  Filled 2019-12-07: qty 1

## 2019-12-07 NOTE — ED Triage Notes (Signed)
Pt reports RLQ since Tuesday. States appetite decreased, last BM yesterday, tender to touch, pain is getting worse

## 2019-12-08 ENCOUNTER — Emergency Department (HOSPITAL_BASED_OUTPATIENT_CLINIC_OR_DEPARTMENT_OTHER): Payer: Medicaid Other

## 2019-12-08 LAB — COMPREHENSIVE METABOLIC PANEL
ALT: 20 U/L (ref 0–44)
AST: 22 U/L (ref 15–41)
Albumin: 3.4 g/dL — ABNORMAL LOW (ref 3.5–5.0)
Alkaline Phosphatase: 73 U/L (ref 38–126)
Anion gap: 6 (ref 5–15)
BUN: 14 mg/dL (ref 6–20)
CO2: 26 mmol/L (ref 22–32)
Calcium: 8.5 mg/dL — ABNORMAL LOW (ref 8.9–10.3)
Chloride: 108 mmol/L (ref 98–111)
Creatinine, Ser: 0.64 mg/dL (ref 0.44–1.00)
GFR calc Af Amer: >60 mL/min (ref >60)
GFR calc non Af Amer: >60 mL/min (ref >60)
Glucose, Bld: 80 mg/dL (ref 70–99)
Potassium: 3.5 mmol/L (ref 3.5–5.1)
Sodium: 140 mmol/L (ref 135–145)
Total Bilirubin: 0.7 mg/dL (ref 0.3–1.2)
Total Protein: 6.5 g/dL (ref 6.5–8.1)

## 2019-12-08 LAB — LIPASE, BLOOD: Lipase: 26 U/L (ref 11–51)

## 2019-12-08 MED ORDER — NAPROXEN 500 MG PO TABS: 500.0000 mg | ORAL_TABLET | Freq: Two times a day (BID) | ORAL | 0 refills | Status: DC

## 2019-12-08 NOTE — Discharge Instructions (Addendum)
You were seen today for abdominal pain.  The cause of your pain at this time is unknown.  However, your work-up is reassuring including CT scan.  Take naproxen as needed for pain.  If your pain worsens or changes, you should be reevaluated.

## 2019-12-08 NOTE — ED Provider Notes (Signed)
[facility] EMERGENCY DEPARTMENT Provider Note   CSN: [id number] Arrival date & time: 12/07/19  2253     History Chief Complaint  Patient presents with  . Abdominal Pain    Evelyn [name] is a 48 y.o. female.  HPI     This a 48 year old female with a history of of reflux who presents with right lower quadrant pain.  Patient reports waxing and waning right lower quadrant pain since Tuesday.  She states that it is dull but generally has gotten more intense since Tuesday.  At times she has some left back pain as well.  Currently she rates her pain at 6 out of 10.  She took Tylenol at home with minimal relief.  She denies nausea, vomiting, or diarrhea.  She does report that she just has not felt like eating.  Denies any vaginal discharge or concerns for STD.  She states that today she may have developed slight dysuria.  No fevers.  No known sick contacts.  Past Medical History:  Diagnosis Date  . Depression   . GERD (gastroesophageal reflux disease)   . Obesity     There are no problems to display for this patient.   Past Surgical History:  Procedure Laterality Date  . ABDOMINAL HYSTERECTOMY    . BREAST LUMPECTOMY Left 1997  . CHOLECYSTECTOMY    . GASTRIC BYPASS    . THROAT SURGERY     removed scar tissue from throat d/t being intubated.   . TUBAL LIGATION       OB History    Gravida  5   Para  3   Term  3   Preterm      AB  2   Living        SAB      TAB      Ectopic      Multiple      Live Births  3           Family History  Problem Relation Age of Onset  . Breast cancer Mother 10  . Breast cancer Paternal Aunt   . Breast cancer Paternal Grandmother     Social History   Tobacco Use  . Smoking status: Never Smoker  . Smokeless tobacco: Never Used  Substance Use Topics  . Alcohol use: No  . Drug use: No    Home Medications Prior to Admission medications   Medication Sig Start Date End Date Taking? Authorizing  Provider  benzonatate (TESSALON) 100 MG capsule Take 1 capsule (100 mg total) by mouth every 8 (eight) hours. 12/27/17   Tanna Furry, MD  cyclobenzaprine (FLEXERIL) 10 MG tablet Take 1 tablet (10 mg total) by mouth 2 (two) times daily as needed for muscle spasms. Patient not taking: Reported on 02/14/2017 05/12/16   Fredia Sorrow, MD  doxycycline (VIBRAMYCIN) 100 MG capsule Take 1 capsule (100 mg total) by mouth 2 (two) times daily. Patient not taking: Reported on 02/14/2017 01/16/17   Tanna Furry, MD  DULoxetine (CYMBALTA) 30 MG capsule Take 30 mg by mouth every morning.    [provider]  ibuprofen (ADVIL,MOTRIN) 800 MG tablet Take 1 tablet (800 mg total) by mouth 3 (three) times daily. Patient not taking: Reported on 01/16/2017 06/22/13   Domenic Moras, PA-C  naproxen (NAPROSYN) 500 MG tablet Take 1 tablet (500 mg total) by mouth 2 (two) times daily. 12/08/19   Sherron Mummert, Barbette Hair, MD  omeprazole (PRILOSEC) 20 MG capsule Take 1 capsule (20 mg  total) by mouth daily. Patient not taking: Reported on 01/16/2017 09/21/15   Forde Dandy, MD  ondansetron (ZOFRAN-ODT) 4 MG disintegrating tablet Take 1 tablet (4 mg total) by mouth once. Patient not taking: Reported on 01/16/2017 03/15/16   Varney Biles, MD  oseltamivir (TAMIFLU) 75 MG capsule Take 1 capsule (75 mg total) by mouth every 12 (twelve) hours. Patient not taking: Reported on 01/16/2017 03/15/16   Varney Biles, MD  oxyCODONE (ROXICODONE) 5 MG immediate release tablet Take 1 tablet (5 mg total) by mouth every 4 (four) hours as needed for severe pain. Patient not taking: Reported on 02/14/2017 01/16/17   Tanna Furry, MD  phentermine 37.5 MG capsule Take 37.5 mg by mouth every morning.    [provider]  promethazine-dextromethorphan (PROMETHAZINE-DM) 6.25-15 MG/5ML syrup Take 5 mLs by mouth 4 (four) times daily as needed for cough. 12/27/17   Tanna Furry, MD  ursodiol (ACTIGALL) 250 MG tablet Take 250 mg by mouth 2 (two) times daily.     [provider]    Allergies    Dilaudid [hydromorphone hcl], Morphine and related, and Vicodin [hydrocodone-acetaminophen]  Review of Systems   Review of Systems  Constitutional: Negative for fever.  Respiratory: Negative for shortness of breath.   Cardiovascular: Negative for chest pain.  Gastrointestinal: Positive for abdominal pain. Negative for diarrhea, nausea and vomiting.  Genitourinary: Positive for enuresis. Negative for hematuria.  Musculoskeletal: Positive for back pain.  Neurological: Negative for headaches.  All other systems reviewed and are negative.   Physical Exam Updated Vital Signs BP 111/80 (BP Location: Left Arm)   Pulse 74   Temp 98.9 F (37.2 C)   Resp 16   Ht 1.702 m (5\' 7" )   Wt 106.1 kg   SpO2 100%   BMI 36.65 kg/m   Physical Exam Vitals and nursing note reviewed.  Constitutional:      Appearance: She is well-developed. She is obese. She is not ill-appearing.  HENT:     Head: Normocephalic and atraumatic.  Cardiovascular:     Rate and Rhythm: Normal rate and regular rhythm.     Heart sounds: Normal heart sounds.  Pulmonary:     Effort: Pulmonary effort is normal. No respiratory distress.     Breath sounds: No wheezing.  Abdominal:     General: Bowel sounds are normal.     Palpations: Abdomen is soft.     Tenderness: There is abdominal tenderness in the right upper quadrant. There is no guarding or rebound. Negative signs include Rovsing's sign.  Musculoskeletal:     Cervical back: Neck supple.  Skin:    General: Skin is warm and dry.  Neurological:     Mental Status: She is alert and oriented to person, place, and time.  Psychiatric:        Mood and Affect: Mood normal.     ED Results / Procedures / Treatments   Labs (all labs ordered are listed, but only abnormal results are displayed) Labs Reviewed  CBC WITH DIFFERENTIAL/PLATELET - Abnormal; Notable for the following components:      Result Value   RBC 3.74 (*)     Hemoglobin 10.6 (*)    HCT 32.5 (*)    All other components within normal limits  COMPREHENSIVE METABOLIC PANEL - Abnormal; Notable for the following components:   Calcium 8.5 (*)    Albumin 3.4 (*)    All other components within normal limits  URINALYSIS, ROUTINE W REFLEX MICROSCOPIC - Abnormal; Notable for  the following components:   APPearance HAZY (*)    Specific Gravity, Urine >1.030 (*)    All other components within normal limits  LIPASE, BLOOD    EKG None  Radiology CT Renal Stone Study  Result Date: 12/08/2019 CLINICAL DATA:  Right lower quadrant abdominal pain EXAM: CT ABDOMEN AND PELVIS WITHOUT CONTRAST TECHNIQUE: Multidetector CT imaging of the abdomen and pelvis was performed following the standard protocol without IV contrast. COMPARISON:  04/11/2018 FINDINGS: Lower chest: The lung bases are clear. The heart size is normal. Hepatobiliary: There is a hypoattenuating lesion in the left hepatic lobe measuring approximately 3.6 cm is favored to the patient's previously demonstrated hepatic hemangioma. There is a small 0.9 cm hypoattenuating nodule in the right hepatic lobe that too small to characterize but may represent an additional hemangioma or small cyst. Status post cholecystectomy.There is no biliary ductal dilation. Pancreas: Normal contours without ductal dilatation. No peripancreatic fluid collection. Spleen: No splenic laceration or hematoma. Adrenals/Urinary Tract: --Adrenal glands: No adrenal hemorrhage. --Right kidney/ureter: No hydronephrosis or perinephric hematoma. --Left kidney/ureter: No hydronephrosis or perinephric hematoma. --Urinary bladder: The urinary bladder is decompressed which limits evaluation. Stomach/Bowel: --Stomach/Duodenum: The patient is status post prior gastric bypass. There is no evidence for an obstruction. --Small bowel: No dilatation or inflammation. --Colon: No focal abnormality. --Appendix: Normal. Vascular/Lymphatic: Atherosclerotic  calcification is present within the non-aneurysmal abdominal aorta, without hemodynamically significant stenosis. --No retroperitoneal lymphadenopathy. --No mesenteric lymphadenopathy. --No pelvic or inguinal lymphadenopathy. Reproductive: Status post hysterectomy. No adnexal mass. Other: No ascites or free air. The abdominal wall is normal. Musculoskeletal. No acute displaced fractures. IMPRESSION: 1. No acute abdominopelvic abnormality. Specifically, the appendix is normal. 2. Hypodense lesion in the left hepatic lobe is favored to the patient's previously demonstrated hepatic hemangioma. Aortic Atherosclerosis (ICD10-I70.0). Electronically Signed   By: Constance Holster M.D.   On: 12/08/2019 00:32    Procedures Procedures (including critical care time)  Medications Ordered in ED Medications  ondansetron (ZOFRAN-ODT) disintegrating tablet 4 mg (4 mg Oral Given 12/07/19 2338)  ketorolac (TORADOL) 30 MG/ML injection 30 mg (30 mg Intramuscular Given 12/07/19 2338)    ED Course  I have reviewed the triage vital signs and the nursing notes.  Pertinent labs & imaging results that were available during my care of the patient were reviewed by me and considered in my medical decision making (see chart for details).    MDM Rules/Calculators/A&P  Patient presents with right lower quadrant pain.  Ongoing for the last 5 days.  She is overall nontoxic and vital signs are reassuring.  She does have some right lower quadrant tenderness to palpation without rebound or guarding.  Negative Rovsing's.  She is overall clinically well-appearing and vital signs are reassuring.  Patient was given Toradol.  Considerations include appendicitis, kidney stone.  Patient reports she has had a hysterectomy including an oophorectomy.  Lower suspicion for ovarian or pelvic pathology.  UTI is also consideration.  Lab work reviewed and largely reassuring.  CT stone study obtained.  No evidence of renal stone.  Normal appendix.   Patient improved after Toradol.  Could be strain.  Will discharge home with naproxen.  After history, exam, and medical workup I feel the patient has been appropriately medically screened and is safe for discharge home. Pertinent diagnoses were discussed with the patient. Patient was given return precautions.   Final Clinical Impression(s) / ED Diagnoses Final diagnoses:  RLQ abdominal pain    Rx / DC Orders ED Discharge Orders  Ordered    naproxen (NAPROSYN) 500 MG tablet  2 times daily     12/08/19 0102           Merryl Hacker, MD 12/08/19 [phone]

## 2020-03-06 ENCOUNTER — Emergency Department (HOSPITAL_BASED_OUTPATIENT_CLINIC_OR_DEPARTMENT_OTHER)
Admission: EM | Admit: 2020-03-06 | Discharge: 2020-03-06 | Disposition: A | Discharge status: Home / Self Care | Payer: Medicaid Other | Attending: Emergency Medicine | Admitting: Emergency Medicine

## 2020-03-06 ENCOUNTER — Encounter (HOSPITAL_BASED_OUTPATIENT_CLINIC_OR_DEPARTMENT_OTHER): Payer: Self-pay | Admitting: *Deleted

## 2020-03-06 ENCOUNTER — Other Ambulatory Visit: Payer: Self-pay

## 2020-03-06 ENCOUNTER — Emergency Department (HOSPITAL_BASED_OUTPATIENT_CLINIC_OR_DEPARTMENT_OTHER): Payer: Medicaid Other

## 2020-03-06 DIAGNOSIS — Z79899 Other long term (current) drug therapy: Secondary | ICD-10-CM | POA: Diagnosis not present

## 2020-03-06 DIAGNOSIS — Y999 Unspecified external cause status: Secondary | ICD-10-CM | POA: Insufficient documentation

## 2020-03-06 DIAGNOSIS — Z885 Allergy status to narcotic agent status: Secondary | ICD-10-CM | POA: Diagnosis not present

## 2020-03-06 DIAGNOSIS — Y929 Unspecified place or not applicable: Secondary | ICD-10-CM | POA: Diagnosis not present

## 2020-03-06 DIAGNOSIS — Y939 Activity, unspecified: Secondary | ICD-10-CM | POA: Insufficient documentation

## 2020-03-06 DIAGNOSIS — S99922A Unspecified injury of left foot, initial encounter: Secondary | ICD-10-CM | POA: Diagnosis present

## 2020-03-06 DIAGNOSIS — W228XXA Striking against or struck by other objects, initial encounter: Secondary | ICD-10-CM | POA: Diagnosis not present

## 2020-03-06 DIAGNOSIS — T1490XA Injury, unspecified, initial encounter: Secondary | ICD-10-CM

## 2020-03-06 DIAGNOSIS — S9032XA Contusion of left foot, initial encounter: Secondary | ICD-10-CM | POA: Insufficient documentation

## 2020-03-06 DIAGNOSIS — Z9884 Bariatric surgery status: Secondary | ICD-10-CM | POA: Diagnosis not present

## 2020-03-06 NOTE — ED Provider Notes (Signed)
[facility] EMERGENCY DEPARTMENT Provider Note   CSN: [id number] Arrival date & time: 03/06/20  1957     History Chief Complaint  Patient presents with  . Foot Injury    Evelyn [name] is a 49 y.o. female.  Patient presents to the emergency department today after striking her left foot at the base of the great toe on a piece of metal while assisting her son who is an event planner.  Patient states that she has had significant pain on the inside of the foot and has had to walk on the outside of her foot.  No treatments prior to arrival.  No ankle, knee, or hip injury.  No numbness or tingling.        Past Medical History:  Diagnosis Date  . Depression   . GERD (gastroesophageal reflux disease)   . Obesity     There are no problems to display for this patient.   Past Surgical History:  Procedure Laterality Date  . ABDOMINAL HYSTERECTOMY    . BREAST LUMPECTOMY Left 1997  . CHOLECYSTECTOMY    . GASTRIC BYPASS    . THROAT SURGERY     removed scar tissue from throat d/t being intubated.   . TUBAL LIGATION       OB History    Gravida  5   Para  3   Term  3   Preterm      AB  2   Living        SAB      TAB      Ectopic      Multiple      Live Births  3           Family History  Problem Relation Age of Onset  . Breast cancer Mother 85  . Breast cancer Paternal Aunt   . Breast cancer Paternal Grandmother     Social History   Tobacco Use  . Smoking status: Never Smoker  . Smokeless tobacco: Never Used  Substance Use Topics  . Alcohol use: No  . Drug use: No    Home Medications Prior to Admission medications   Medication Sig Start Date End Date Taking? Authorizing Provider  DULoxetine (CYMBALTA) 60 MG capsule Take by mouth. 02/27/20 02/26/21 Yes [provider]  Vitamin D, Ergocalciferol, (DRISDOL) 1.25 MG (50000 UNIT) CAPS capsule Take 50,000 Units by mouth once a week. 01/01/20  Yes [provider]   omeprazole (PRILOSEC) 20 MG capsule Take 1 capsule (20 mg total) by mouth daily. Patient not taking: Reported on 01/16/2017 09/21/15 03/06/20  Forde Dandy, MD  phentermine 37.5 MG capsule Take 37.5 mg by mouth every morning.  03/06/20  [provider]    Allergies    Dilaudid [hydromorphone hcl], Morphine and related, and Vicodin [hydrocodone-acetaminophen]  Review of Systems   Review of Systems  Constitutional: Negative for activity change.  Musculoskeletal: Positive for arthralgias and joint swelling. Negative for back pain and neck pain.  Skin: Negative for wound.  Neurological: Negative for weakness and numbness.    Physical Exam Updated Vital Signs BP 128/86 (BP Location: Left Arm)   Pulse 86   Temp 98.2 F (36.8 C) (Oral)   Resp 18   Ht 5\' 7"  (1.702 m)   Wt 108.9 kg   SpO2 100%   BMI 37.59 kg/m   Physical Exam Vitals and nursing note reviewed.  Constitutional:      Appearance: She is well-developed.  HENT:  Head: Normocephalic and atraumatic.  Eyes:     Pupils: Pupils are equal, round, and reactive to light.  Cardiovascular:     Pulses: Normal pulses. No decreased pulses.  Musculoskeletal:        General: Tenderness present.     Cervical back: Normal range of motion and neck supple.     Left foot: Swelling and tenderness present.     Comments: Left foot: Patient with tenderness and swelling at the base of the left great toe at the MTP joint.  Patient with tenderness with range of motion.  She is able to wiggle her toes.  Distal sensation intact.  Normal capillary refill.  Skin:    General: Skin is warm and dry.  Neurological:     Mental Status: She is alert.     Sensory: No sensory deficit.     Comments: Motor, sensation, and vascular distal to the injury is fully intact.      ED Results / Procedures / Treatments   Labs (all labs ordered are listed, but only abnormal results are displayed) Labs Reviewed - No data to display  EKG None   Radiology DG Foot Complete Left  Result Date: 03/06/2020 CLINICAL DATA:  Status post trauma with subsequent left great toe pain. EXAM: LEFT FOOT - COMPLETE 3+ VIEW COMPARISON:  June 24, 2011 FINDINGS: There is no evidence of fracture or dislocation. There is no evidence of arthropathy or other focal bone abnormality. Soft tissues are unremarkable. IMPRESSION: Negative. Electronically Signed   By: Virgina Norfolk M.D.   On: 03/06/2020 20:48    Procedures Procedures (including critical care time)  Medications Ordered in ED Medications - No data to display  ED Course  I have reviewed the triage vital signs and the nursing notes.  Pertinent labs & imaging results that were available during my care of the patient were reviewed by me and considered in my medical decision making (see chart for details).  Patient seen and examined.  X-ray reviewed.  No definite fractures.  Vital signs reviewed and are as follows: BP 128/86 (BP Location: Left Arm)   Pulse 86   Temp 98.2 F (36.8 C) (Oral)   Resp 18   Ht 5\' 7"  (1.702 m)   Wt 108.9 kg   SpO2 100%   BMI 37.59 kg/m   Patient provided with postop shoe.  Discussed rice protocol and use of NSAIDs for pain.  Encourage PCP follow-up 1 week if she continues to have significant pain or difficulty with bearing weight.  We discussed that x-rays can sometimes miss nondisplaced fractures and she would benefit from reimaging if symptoms persist.    MDM Rules/Calculators/A&P                      Patient with foot contusion.  No significant nerve, vascular, or soft tissue injury suspected.  Conservative measures indicated at the current time with PCP follow-up if not improving.   Final Clinical Impression(s) / ED Diagnoses Final diagnoses:  Contusion of left foot, initial encounter    Rx / DC Orders ED Discharge Orders    None       Suann Larry 03/06/20 2113    Quintella Reichert, MD 03/06/20 [phone]

## 2020-03-06 NOTE — ED Triage Notes (Signed)
Pt reports she injured her left foot on a metal arch today. C/o pain to left great toe

## 2020-03-06 NOTE — Discharge Instructions (Signed)
Please read and follow all provided instructions.  Your diagnoses today include:  1. Contusion of left foot, initial encounter   2. Injury     Tests performed today include:  An x-ray of the affected area - does NOT show any broken bones  Vital signs. See below for your results today.   Medications prescribed:  Please use over-the-counter NSAID medications (ibuprofen, naproxen) as directed on the packaging for pain.   Take any prescribed medications only as directed.  Home care instructions:   Follow any educational materials contained in this packet  Follow R.I.C.E. Protocol:  R - rest your injury   I  - use ice on injury without applying directly to skin  C - compress injury with bandage or splint  E - elevate the injury as much as possible  Follow-up instructions: Please follow-up with your primary care provider or the provided orthopedic physician (bone specialist) if you continue to have significant pain in 1 week. In this case you may have a more severe injury that requires further care.   Return instructions:   Please return if your toes or feet are numb or tingling, appear gray or blue, or you have severe pain (also elevate the leg and loosen splint or wrap if you were given one)  Please return to the Emergency Department if you experience worsening symptoms.   Please return if you have any other emergent concerns.  Additional Information:  Your vital signs today were: BP 128/86 (BP Location: Left Arm)   Pulse 86   Temp 98.2 F (36.8 C) (Oral)   Resp 18   Ht 5\' 7"  (1.702 m)   Wt 108.9 kg   SpO2 100%   BMI 37.59 kg/m  If your blood pressure (BP) was elevated above 135/85 this visit, please have this repeated by your doctor within one month. --------------

## 2020-09-09 ENCOUNTER — Emergency Department (HOSPITAL_BASED_OUTPATIENT_CLINIC_OR_DEPARTMENT_OTHER): Payer: Medicaid Other

## 2020-09-09 ENCOUNTER — Other Ambulatory Visit: Payer: Self-pay

## 2020-09-09 ENCOUNTER — Emergency Department (HOSPITAL_BASED_OUTPATIENT_CLINIC_OR_DEPARTMENT_OTHER)
Admission: EM | Admit: 2020-09-09 | Discharge: 2020-09-09 | Disposition: A | Discharge status: Home / Self Care | Payer: Medicaid Other | Attending: Emergency Medicine | Admitting: Emergency Medicine

## 2020-09-09 ENCOUNTER — Encounter (HOSPITAL_BASED_OUTPATIENT_CLINIC_OR_DEPARTMENT_OTHER): Payer: Self-pay | Admitting: *Deleted

## 2020-09-09 DIAGNOSIS — M25561 Pain in right knee: Secondary | ICD-10-CM | POA: Diagnosis not present

## 2020-09-09 DIAGNOSIS — Z5321 Procedure and treatment not carried out due to patient leaving prior to being seen by health care provider: Secondary | ICD-10-CM | POA: Diagnosis not present

## 2020-09-09 NOTE — ED Notes (Signed)
Pt informed registration she was leaving. Pt ambulated from ED independently

## 2020-09-09 NOTE — ED Triage Notes (Signed)
Pain in her right knee for a week. No known injury.

## 2021-05-12 IMAGING — MG MM DIGITAL DIAGNOSTIC BILAT W/ TOMO W/ CAD
8 of 14 series · 8 of 38 positions shown · non-contrast
Comparison: December 29, 2016 and December 14, 2016

CLINICAL DATA: 47-year-old patient presents for delayed follow-up
of probably benign left breast calcifications and annual examination
of both breasts.

EXAM:
DIGITAL DIAGNOSTIC BILATERAL MAMMOGRAM WITH CAD AND TOMO

[L ML]
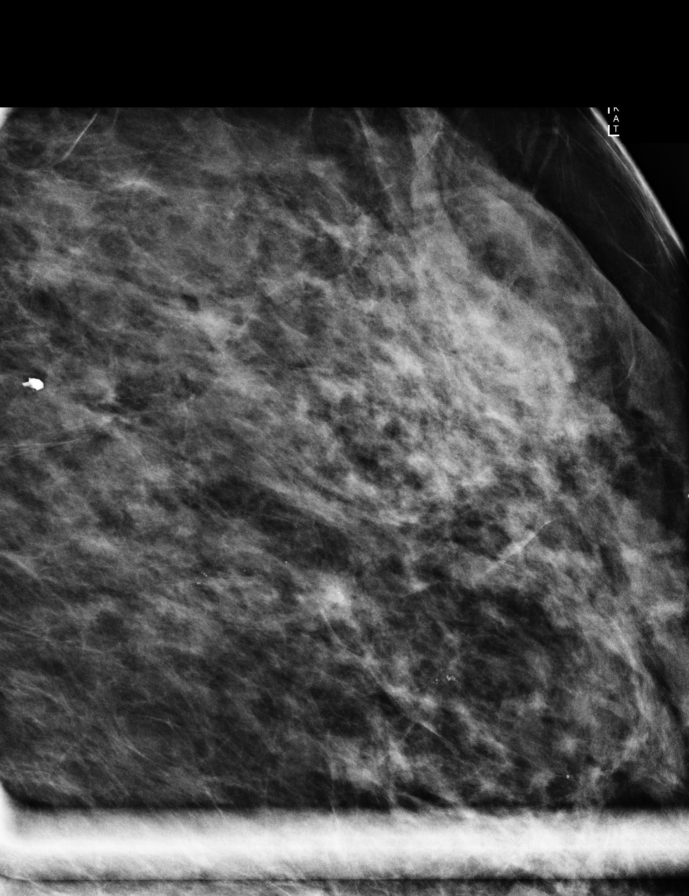

[L CC]
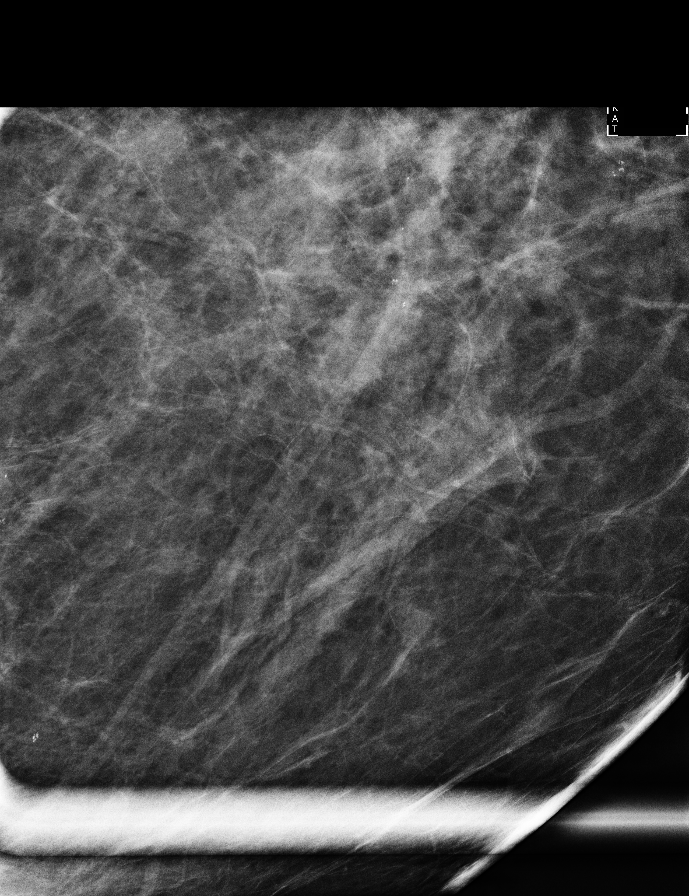

[R CC synth-2D (1 of 2)]
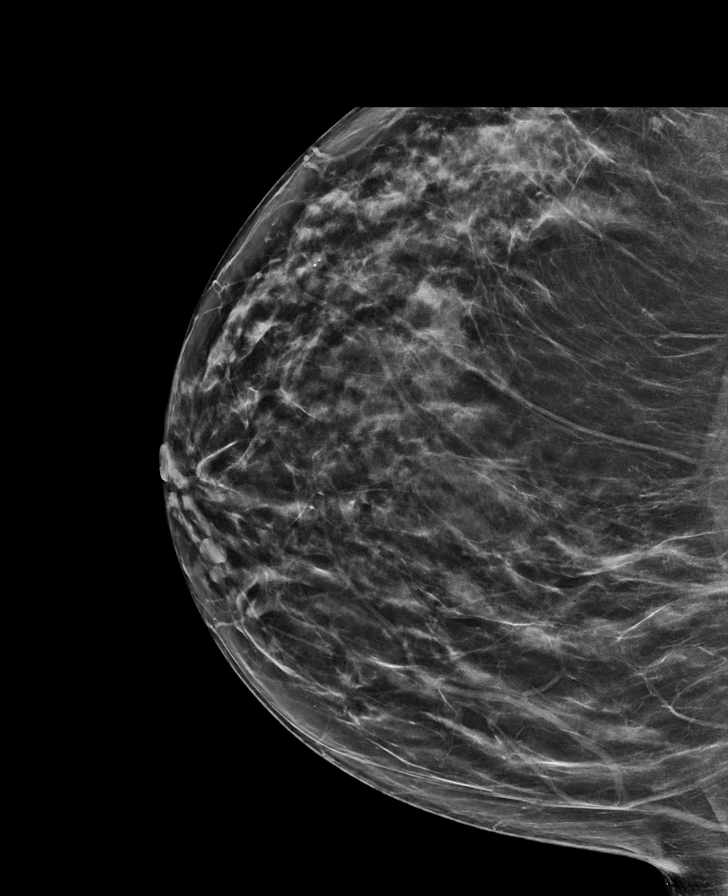

[L MLO synth-2D]
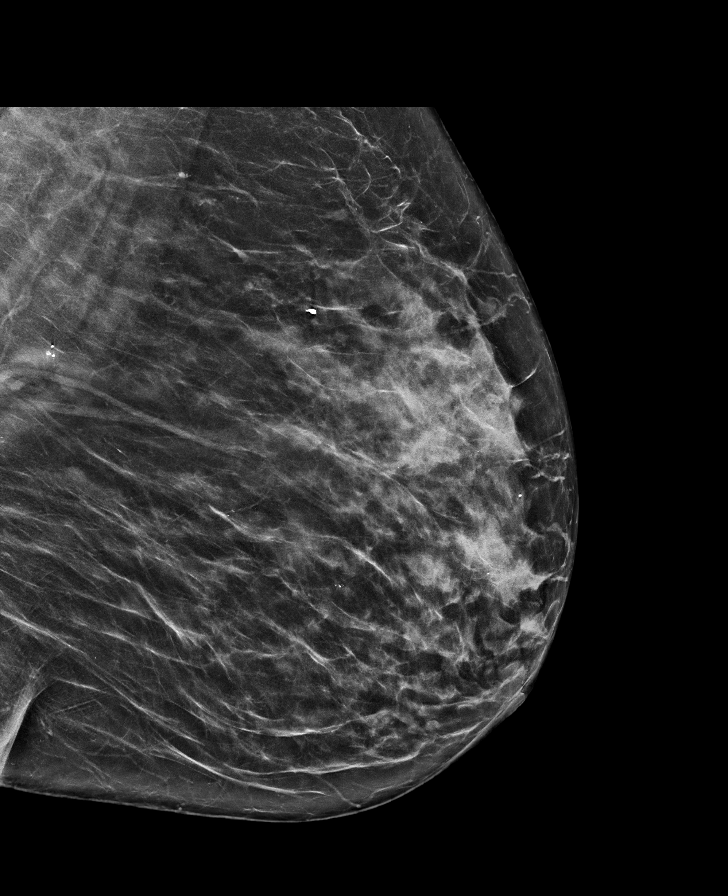

[L CC synth-2D (1 of 2)]
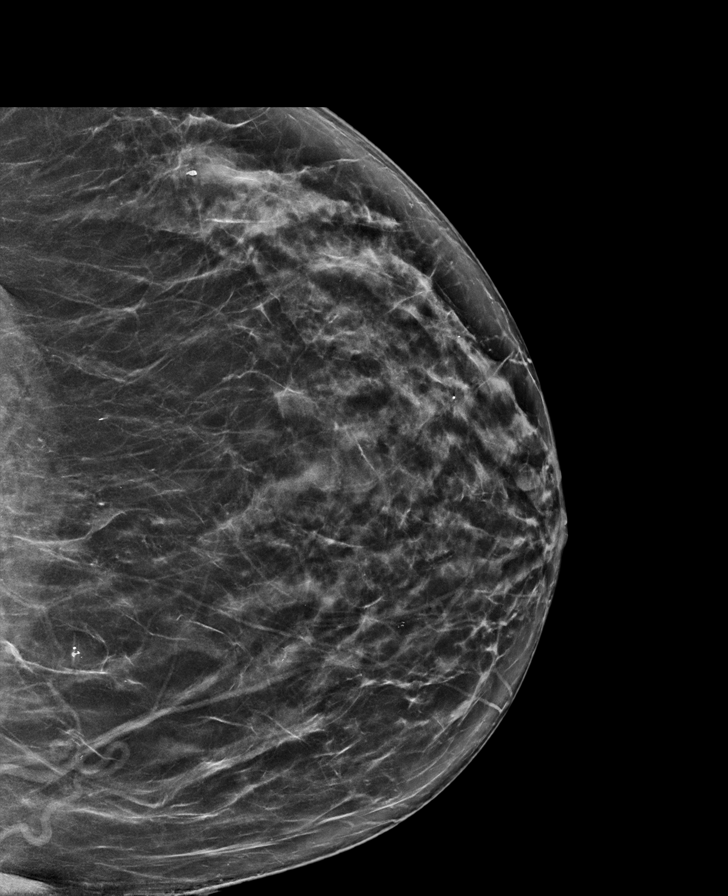

[R MLO synth-2D]
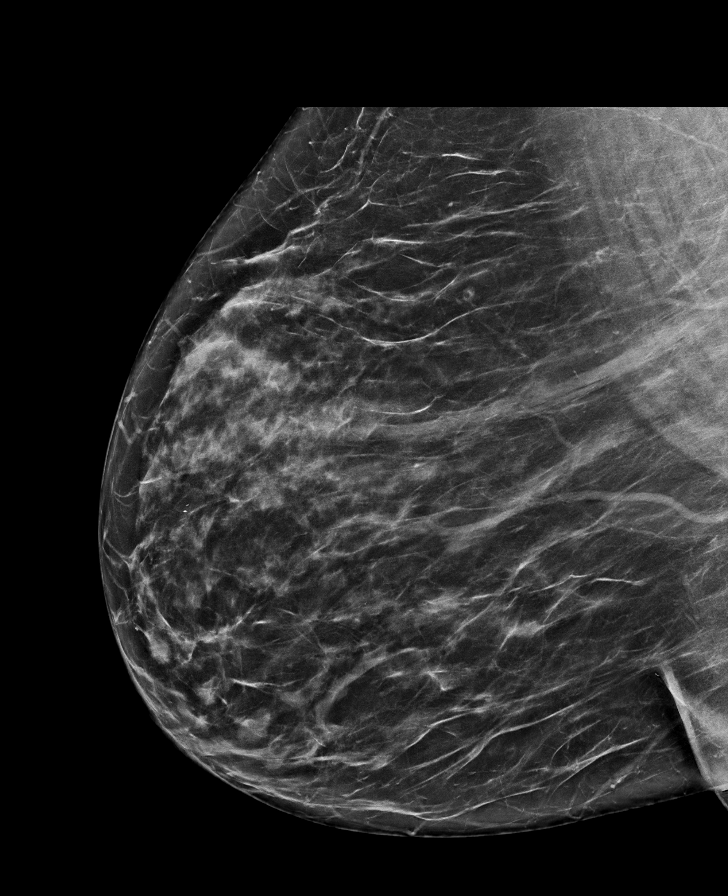

[L CC synth-2D (2 of 2)]
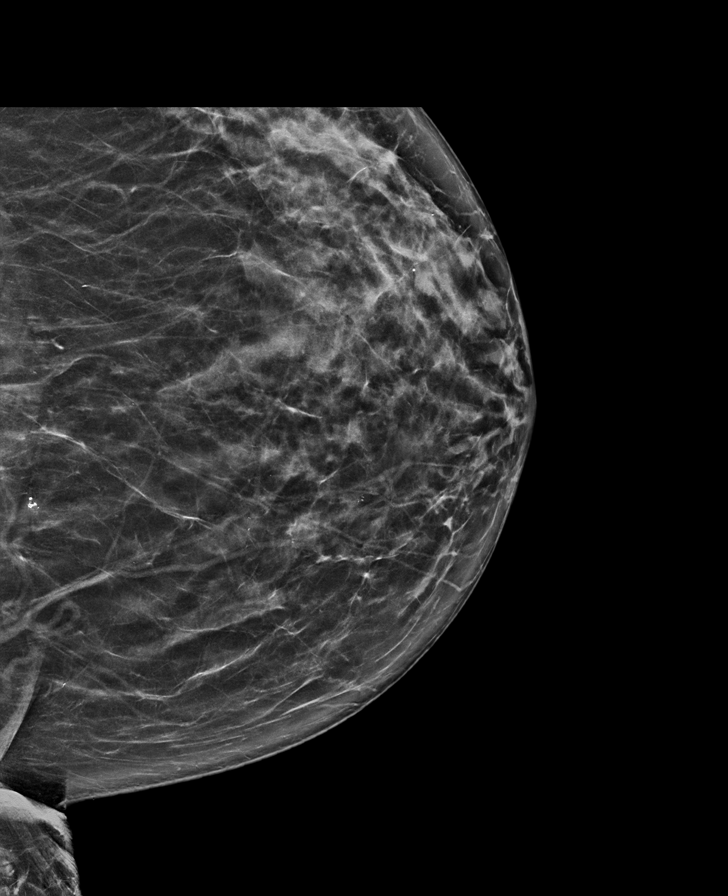

[R CC synth-2D (2 of 2)]
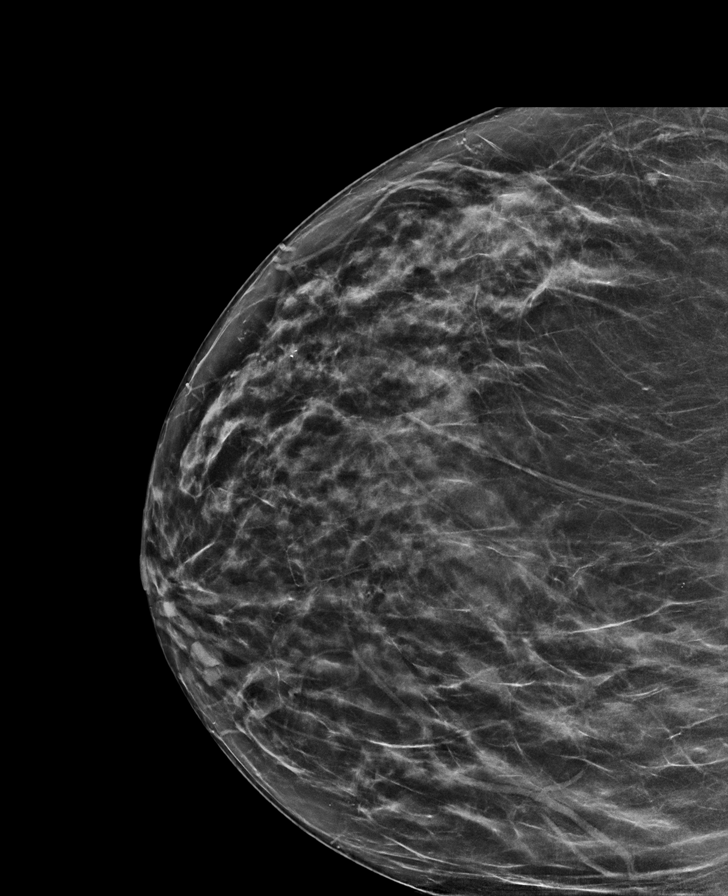

[8 of 38 positions shown; findings below may reference images not displayed]

ACR Breast Density Category c: The breast tissue is heterogeneously
dense, which may obscure small masses.
FINDINGS: Magnification views of the upper inner left breast show stable
rounded calcifications with a benign appearance. These are unchanged
compared to magnification views December 29, 2016 and can be
considered benign.

No mass, architectural distortion, or suspicious microcalcification
is identified to suggest malignancy in either breast.

Mammographic images were processed with CAD.
IMPRESSION: No evidence of malignancy in either breast. Stable benign
calcifications left breast.

RECOMMENDATION:
Screening mammogram in one year.(Code:[id number])

I have discussed the findings and recommendations with the patient.
If applicable, a reminder letter will be sent to the patient
regarding the next appointment.

BI-RADS CATEGORY  2: Benign.

## 2022-03-15 DIAGNOSIS — L0292 Furuncle, unspecified: Secondary | ICD-10-CM | POA: Insufficient documentation

## 2022-10-04 ENCOUNTER — Encounter (HOSPITAL_COMMUNITY): Payer: Self-pay | Admitting: Emergency Medicine

## 2022-10-04 ENCOUNTER — Emergency Department (HOSPITAL_COMMUNITY)
Admission: EM | Admit: 2022-10-04 | Discharge: 2022-10-05 | Discharge status: Against Medical Advice | Payer: Medicaid Other | Attending: Student | Admitting: Student

## 2022-10-04 ENCOUNTER — Emergency Department (HOSPITAL_COMMUNITY): Payer: Medicaid Other

## 2022-10-04 ENCOUNTER — Other Ambulatory Visit: Payer: Self-pay

## 2022-10-04 DIAGNOSIS — R0602 Shortness of breath: Secondary | ICD-10-CM | POA: Diagnosis not present

## 2022-10-04 DIAGNOSIS — Z5321 Procedure and treatment not carried out due to patient leaving prior to being seen by health care provider: Secondary | ICD-10-CM | POA: Insufficient documentation

## 2022-10-04 DIAGNOSIS — R079 Chest pain, unspecified: Secondary | ICD-10-CM | POA: Diagnosis present

## 2022-10-04 DIAGNOSIS — M7989 Other specified soft tissue disorders: Secondary | ICD-10-CM | POA: Diagnosis not present

## 2022-10-04 LAB — MAGNESIUM: Magnesium: 2 mg/dL (ref 1.7–2.4)

## 2022-10-04 LAB — CBC WITH DIFFERENTIAL/PLATELET
Abs Immature Granulocytes: 0.01 10*3/uL (ref 0.00–0.07)
Basophils Absolute: 0 10*3/uL (ref 0.0–0.1)
Basophils Relative: 1 %
Eosinophils Absolute: 0 10*3/uL (ref 0.0–0.5)
Eosinophils Relative: 1 %
HCT: 35.4 % — ABNORMAL LOW (ref 36.0–46.0)
Hemoglobin: 11.7 g/dL — ABNORMAL LOW (ref 12.0–15.0)
Immature Granulocytes: 0 %
Lymphocytes Relative: 23 %
Lymphs Abs: 1.5 10*3/uL (ref 0.7–4.0)
MCH: 27.5 pg (ref 26.0–34.0)
MCHC: 33.1 g/dL (ref 30.0–36.0)
MCV: 83.3 fL (ref 80.0–100.0)
Monocytes Absolute: 0.4 10*3/uL (ref 0.1–1.0)
Monocytes Relative: 6 %
Neutro Abs: 4.6 10*3/uL (ref 1.7–7.7)
Neutrophils Relative %: 69 %
Platelets: 274 10*3/uL (ref 150–400)
RBC: 4.25 MIL/uL (ref 3.87–5.11)
RDW: 14.1 % (ref 11.5–15.5)
WBC: 6.5 10*3/uL (ref 4.0–10.5)
nRBC: 0 % (ref 0.0–0.2)

## 2022-10-04 LAB — COMPREHENSIVE METABOLIC PANEL
ALT: 17 U/L (ref 0–44)
AST: 21 U/L (ref 15–41)
Albumin: 3.4 g/dL — ABNORMAL LOW (ref 3.5–5.0)
Alkaline Phosphatase: 65 U/L (ref 38–126)
Anion gap: 8 (ref 5–15)
BUN: 15 mg/dL (ref 6–20)
CO2: 22 mmol/L (ref 22–32)
Calcium: 9 mg/dL (ref 8.9–10.3)
Chloride: 109 mmol/L (ref 98–111)
Creatinine, Ser: 1.15 mg/dL — ABNORMAL HIGH (ref 0.44–1.00)
GFR, Estimated: 57 mL/min — ABNORMAL LOW (ref >60)
Glucose, Bld: 95 mg/dL (ref 70–99)
Potassium: 3.8 mmol/L (ref 3.5–5.1)
Sodium: 139 mmol/L (ref 135–145)
Total Bilirubin: 0.5 mg/dL (ref 0.3–1.2)
Total Protein: 6.7 g/dL (ref 6.5–8.1)

## 2022-10-04 LAB — TROPONIN I (HIGH SENSITIVITY): Troponin I (High Sensitivity): 3 ng/L (ref <18)

## 2022-10-04 LAB — D-DIMER, QUANTITATIVE: D-Dimer, Quant: 0.39 ug/mL-FEU (ref 0.00–0.50)

## 2022-10-04 LAB — BRAIN NATRIURETIC PEPTIDE: B Natriuretic Peptide: 17 pg/mL (ref 0.0–100.0)

## 2022-10-04 NOTE — ED Triage Notes (Signed)
Pt reported to ED with c/o left sided chest pain, headache, nausea and dizziness that has occurred intermittently x3 days. States she also experiences shortness breath with her chest pain.

## 2022-10-04 NOTE — ED Provider Triage Note (Signed)
Emergency Medicine Provider Triage Evaluation Note  Evelyn [name] , a 51 y.o. female  was evaluated in triage.  Pt complains of chest pain, shortness of breath and leg swelling.  Onset 2 weeks ago.  Patient is a bus driver.  Patient reports bilateral leg swelling.  She reports exertional shortness of breath and chest pain.  Chest pain is constant.  Nonradiating.  No cardiac history.  Patient denies history of PE/DVT.  Review of Systems  Positive: Chest pain, shortness breath, leg swelling Negative: Fever  Physical Exam  BP 127/84 (BP Location: Right Arm)   Pulse 91   Temp 98.9 F (37.2 C)   Resp 18   SpO2 98%  Gen:   Awake, no distress   Resp:  Normal effort  MSK:   Moves extremities without difficulty  Other:  Patient with heart regular rate and rhythm.  Lungs clear to auscultation bilaterally.  No appreciable pitting edema of the lower extremities.  No palpable cord or calf tenderness.  Medical Decision Making  Medically screening exam initiated at 8:40 PM.  Appropriate orders placed.  Kristell Wooding was informed that the remainder of the evaluation will be completed by another provider, this initial triage assessment does not replace that evaluation, and the importance of remaining in the ED until their evaluation is complete.  Patient with chest pain and shortness of breath with leg swelling.  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  Cardiac work-up initiated including D-dimer.   Doristine Devoid, PA-C 10/04/22 2041

## 2022-10-05 NOTE — ED Notes (Signed)
NA x4 vitals 

## 2022-12-20 ENCOUNTER — Encounter (HOSPITAL_BASED_OUTPATIENT_CLINIC_OR_DEPARTMENT_OTHER): Payer: Self-pay | Admitting: Emergency Medicine

## 2022-12-20 ENCOUNTER — Emergency Department (HOSPITAL_BASED_OUTPATIENT_CLINIC_OR_DEPARTMENT_OTHER)
Admission: EM | Admit: 2022-12-20 | Discharge: 2022-12-20 | Disposition: A | Discharge status: Home / Self Care | Payer: Medicaid Other | Attending: Emergency Medicine | Admitting: Emergency Medicine

## 2022-12-20 ENCOUNTER — Other Ambulatory Visit: Payer: Self-pay

## 2022-12-20 DIAGNOSIS — R55 Syncope and collapse: Secondary | ICD-10-CM | POA: Insufficient documentation

## 2022-12-20 LAB — BASIC METABOLIC PANEL
Anion gap: 6 (ref 5–15)
BUN: 13 mg/dL (ref 6–20)
CO2: 26 mmol/L (ref 22–32)
Calcium: 9 mg/dL (ref 8.9–10.3)
Chloride: 105 mmol/L (ref 98–111)
Creatinine, Ser: 0.75 mg/dL (ref 0.44–1.00)
GFR, Estimated: >60 mL/min (ref >60)
Glucose, Bld: 96 mg/dL (ref 70–99)
Potassium: 3.3 mmol/L — ABNORMAL LOW (ref 3.5–5.1)
Sodium: 137 mmol/L (ref 135–145)

## 2022-12-20 LAB — URINALYSIS, ROUTINE W REFLEX MICROSCOPIC
Bilirubin Urine: NEGATIVE
Glucose, UA: NEGATIVE mg/dL
Hgb urine dipstick: NEGATIVE
Ketones, ur: NEGATIVE mg/dL
Leukocytes,Ua: NEGATIVE
Nitrite: NEGATIVE
Protein, ur: NEGATIVE mg/dL
Specific Gravity, Urine: 1.025 (ref 1.005–1.030)
pH: 6 (ref 5.0–8.0)

## 2022-12-20 LAB — CBC
HCT: 34.3 % — ABNORMAL LOW (ref 36.0–46.0)
Hemoglobin: 11.1 g/dL — ABNORMAL LOW (ref 12.0–15.0)
MCH: 26.6 pg (ref 26.0–34.0)
MCHC: 32.4 g/dL (ref 30.0–36.0)
MCV: 82.3 fL (ref 80.0–100.0)
Platelets: 273 10*3/uL (ref 150–400)
RBC: 4.17 MIL/uL (ref 3.87–5.11)
RDW: 14.3 % (ref 11.5–15.5)
WBC: 5.2 10*3/uL (ref 4.0–10.5)
nRBC: 0 % (ref 0.0–0.2)

## 2022-12-20 LAB — CBG MONITORING, ED: Glucose-Capillary: 94 mg/dL (ref 70–99)

## 2022-12-20 NOTE — ED Provider Notes (Signed)
[facility] Provider Note   CSN: [id number] Arrival date & time: 12/20/22  1436     History  Chief Complaint  Patient presents with   Near Syncope    Evelyn [name] is a 51 y.o. female.  Patient is a 51 year old female with a history of GERD who presents with "blackout spells".  She states that these started about a year ago but were pretty mild at the time.  She darted having more prominent spells in August of this year.  She reports that she will be doing an activity and suddenly become nonresponsive.  Have been witnessed by people and they report that she stares off into space and is not responding.  She does not fall.  To time she has done this while she is driving.  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  There is no witnessed seizure activity.  She has had increased frequency of the spells recently.  She does have a history of migraines and has had a resurgence of her migrainous type headaches but they do not always coincide with the episodes.  She does not actually fall to the ground or pass out.  She does not report any dizziness.  No chest pain or shortness of breath.  No palpitations.  No symptoms in between the episodes.       Home Medications Prior to Admission medications   Medication Sig Start Date End Date Taking? Authorizing Provider  DULoxetine (CYMBALTA) 60 MG capsule Take by mouth. 02/27/20 02/26/21  [provider]  Vitamin D, Ergocalciferol, (DRISDOL) 1.25 MG (50000 UNIT) CAPS capsule Take 50,000 Units by mouth once a week. 01/01/20   [provider]  omeprazole (PRILOSEC) 20 MG capsule Take 1 capsule (20 mg total) by mouth daily. Patient not taking: Reported on 01/16/2017 09/21/15 03/06/20  Forde Dandy, MD  phentermine 37.5 MG capsule Take 37.5 mg by mouth every morning.  03/06/20  [provider]      Allergies    Dilaudid [hydromorphone hcl], Morphine and related, and Vicodin [hydrocodone-acetaminophen]    Review of Systems   Review of Systems  Constitutional:  Negative for chills, diaphoresis, fatigue and fever.  HENT:  Negative for congestion, rhinorrhea and sneezing.   Eyes: Negative.   Respiratory:  Negative for cough, chest tightness and shortness of breath.   Cardiovascular:  Negative for chest pain and leg swelling.  Gastrointestinal:  Negative for abdominal pain, blood in stool, diarrhea, nausea and vomiting.  Genitourinary:  Negative for difficulty urinating, flank pain, frequency and hematuria.  Musculoskeletal:  Negative for arthralgias and back pain.  Skin:  Negative for rash.  Neurological:  Negative for dizziness, speech difficulty, weakness, numbness and headaches.    Physical Exam Updated Vital Signs BP 135/78 (BP Location: Right Arm)   Pulse 78   Temp 98.2 F (36.8 C) (Oral)   Resp 20   Wt 129.3 kg   SpO2 100%   BMI 44.64 kg/m  Physical Exam Constitutional:      Appearance: She is well-developed.  HENT:     Head: Normocephalic and atraumatic.  Eyes:     Pupils: Pupils are equal, round, and reactive to light.  Cardiovascular:     Rate and Rhythm: Normal rate and regular rhythm.     Heart sounds: Normal heart sounds.  Pulmonary:  Effort: Pulmonary effort is normal. No respiratory distress.     Breath sounds: Normal breath sounds. No wheezing or rales.  Chest:     Chest wall: No tenderness.  Abdominal:     General: Bowel sounds are normal.     Palpations: Abdomen is soft.     Tenderness: There is no abdominal tenderness. There is no guarding or rebound.  Musculoskeletal:        General: Normal range of motion.     Cervical back: Normal range of motion and neck supple.  Lymphadenopathy:     Cervical: No cervical adenopathy.  Skin:    General: Skin is warm and dry.     Findings: No rash.  Neurological:     Mental  Status: She is alert and oriented to person, place, and time.     Comments: Motor 5/5 all extremities Sensation grossly intact to LT all extremities Finger to Nose intact, no pronator drift CN II-XII grossly intact       ED Results / Procedures / Treatments   Labs (all labs ordered are listed, but only abnormal results are displayed) Labs Reviewed  BASIC METABOLIC PANEL - Abnormal; Notable for the following components:      Result Value   Potassium 3.3 (*)    All other components within normal limits  CBC - Abnormal; Notable for the following components:   Hemoglobin 11.1 (*)    HCT 34.3 (*)    All other components within normal limits  URINALYSIS, ROUTINE W REFLEX MICROSCOPIC  CBG MONITORING, ED    EKG EKG Interpretation  Date/Time:  Wednesday December 20 2022 15:29:50 EST Ventricular Rate:  77 PR Interval:  193 QRS Duration: 91 QT Interval:  363 QTC Calculation: 411 R Axis:   7 Text Interpretation: Sinus rhythm Low voltage, precordial leads Baseline wander in lead(s) V3 since last tracing no significant change Confirmed by Malvin Johns [phone]) on 12/20/2022 5:41:12 PM  Radiology No results found.  Procedures Procedures    Medications Ordered in ED Medications - No data to display  ED Course/ Medical Decision Making/ A&P                           Medical Decision Making Amount and/or Complexity of Data Reviewed Labs: ordered.   Patient is a 51 year old female who presents with episodes of blacking out.  It does not really sound cardiac in nature.  She does not fall to the ground or slumped over.  Her EKG does not show any arrhythmias or ischemic changes.  She is neurologically intact currently.  Her labs are nonconcerning.  I am concerned that these may be type of seizure activity.  I feel that she needs to have an urgent referral to neurology for likely MRI and EEG monitoring.  Since he has been going on for a year, I do not see that we need to admit her to  the hospital for these episodes.  However they are becoming more frequent and she needs to be evaluated by neurology.  I have put in for an amatory referral to neurology.  I have instructed her not to drive at all until she is cleared by neurology.  She was also given another seizure precautions.  Return precautions were given.  Final Clinical Impression(s) / ED Diagnoses Final diagnoses:  Near syncope    Rx / DC Orders ED Discharge Orders          Ordered  Ambulatory referral to Neurology       Comments: An appointment is requested in approximately: 1 week   12/20/22 1753              Malvin Johns, MD 12/20/22 2820

## 2022-12-20 NOTE — ED Triage Notes (Signed)
Pt states has been having "black out spells" since August. Pt states she will have periods for 10-20 minutes where she doesn't remember what is going on. Pt denies any inury and hasn't passed out but will have moments of forgetfulness. Pt sates it has been going on for "awhile" but worse since August 2023. Pt has daily headaches. Denies nausea.

## 2022-12-20 NOTE — Discharge Instructions (Signed)
I am concerned that you may be having a type of seizure activity causing the spells.  I have put an urgent referral to neurology.  If you do not hear from the office within the next couple of days, call the office for an appointment.  Do not drive until cleared by the neurologist.  Return to the emergency room if you have any worsening symptoms.

## 2023-01-03 ENCOUNTER — Telehealth: Payer: Self-pay | Admitting: Neurology

## 2023-01-03 ENCOUNTER — Encounter: Payer: Self-pay | Admitting: Neurology

## 2023-01-03 ENCOUNTER — Ambulatory Visit: Payer: Medicaid Other | Admitting: Neurology

## 2023-01-03 VITALS — BP 120/78 | HR 77 | Ht 67.0 in | Wt 285.0 lb

## 2023-01-03 DIAGNOSIS — R569 Unspecified convulsions: Secondary | ICD-10-CM | POA: Diagnosis not present

## 2023-01-03 DIAGNOSIS — G43709 Chronic migraine without aura, not intractable, without status migrainosus: Secondary | ICD-10-CM

## 2023-01-03 MED ORDER — ZONISAMIDE 100 MG PO CAPS: 100.0000 mg | ORAL_CAPSULE | Freq: Every day | ORAL | 6 refills | Status: DC

## 2023-01-03 MED ORDER — SUMATRIPTAN SUCCINATE 50 MG PO TABS: 50.0000 mg | ORAL_TABLET | ORAL | 3 refills | Status: DC | PRN

## 2023-01-03 NOTE — Patient Instructions (Signed)
Start with zonisamide 50 mg nightly for 1 week then increase to 100 mg nightly Sumatriptan as needed for headaches Routine EEG MRI brain with and without contrast Please use the CPAP machine nightly Follow-up in 6 months or sooner if worse.

## 2023-01-03 NOTE — Telephone Encounter (Signed)
[employer] med[employer] Josem Kaufmann: [id number] exp. 01/03/23-02/17/23 sent to GI [phone]

## 2023-01-03 NOTE — Progress Notes (Signed)
[facility]  PATIENT: Evelyn [name] DOB: 02-Feb-1971  REQUESTING CLINICIAN: Malvin Johns, MD HISTORY FROM: Patient  REASON FOR VISIT: Seizure like activity    HISTORICAL  CHIEF COMPLAINT:  Chief Complaint  Patient presents with   New Patient (Initial Visit)    Rm 12, with aunt  NP Urgent ED referral for seizure like activity, increased frequency of black out spells Last sz ike episode was 12/29/2022, today c/o headache an fatigue     HISTORY OF PRESENT ILLNESS:  This is a 52 year old woman past medical history of sleep apnea currently not on CPAP machine, arthritis, migraines, obesity who is presenting with complaints of seizure-like activity.  Patient reports for the past 3 years, she has been experiencing episodes of behavioral arrest and losing track of time. These episodes have been more frequent to the point that she is getting 4-6 episodes per month.  Some days she woke up at home and be very confused, it will take her about 20-minutes to know where she is. She reports on 2 occasions, family members have witnessed these events.  The first event was when he was riding with his son.  She works as a Recruitment consultant and the son was with her at that time. She reports that she came to her son screaming at her and asking her why she is making constant turn while driving.  The second event was when she was talking with a family member on the phone, she reports she was still driving and come to the family member screaming at her on the phone, calling her name.  She denies any jerk or jerk like activity, denies any injury, denies any tongue biting, urinary incontinence. She reports her last episode was on 12/29/22  Patient also reports a history of migraines since the age of 73, migraines went away after high school but lately have returned with nausea,. She reports for the past 3 months, she has daily headaches, bitemporal then migrate to frontal headaches with nausea.  Headaches last all day. At time she will take OTC tylenol but she is not on any preventive medication.    Handedness: Right handed   Onset: 3 year ago, getting worse   Seizure Type: Staring, losing track of time  Current frequency: 4 to 6 episodes per month   Any injuries from seizures: Denies   Seizure risk factors: None   Previous ASMs: None   Currenty ASMs: None   ASMs side effects: N/A   Brain Images: None   Previous EEGs: None    OTHER MEDICAL CONDITIONS: Migraines, Arthritis, Sleep apnea   REVIEW OF SYSTEMS: Full 14 system review of systems performed and negative with exception of: As noted in the HPI   ALLERGIES: Allergies  Allergen Reactions   Dilaudid [Hydromorphone Hcl] Nausea And Vomiting   Morphine And Related Other (See Comments)    Fever and chills and convulsions   Vicodin [Hydrocodone-Acetaminophen] Nausea And Vomiting    HOME MEDICATIONS: Outpatient Medications Prior to Visit  Medication Sig Dispense Refill   Vitamin D, Ergocalciferol, (DRISDOL) 1.25 MG (50000 UNIT) CAPS capsule Take 50,000 Units by mouth once a week.     DULoxetine (CYMBALTA) 60 MG capsule Take by mouth.     No facility-administered medications prior to visit.    PAST MEDICAL HISTORY: Past Medical History:  Diagnosis Date   Depression    GERD (gastroesophageal reflux disease)    Obesity     PAST SURGICAL HISTORY: Past Surgical History:  Procedure Laterality Date   ABDOMINAL HYSTERECTOMY     BREAST LUMPECTOMY Left 1997   CHOLECYSTECTOMY     GASTRIC BYPASS     THROAT SURGERY     removed scar tissue from throat d/t being intubated.    TUBAL LIGATION      FAMILY HISTORY: Family History  Problem Relation Age of Onset   Breast cancer Mother 51   Breast cancer Paternal Aunt    Breast cancer Paternal Grandmother     SOCIAL HISTORY: Social History   Socioeconomic History   Marital status: Single    Spouse name: Not on file   Number of children: Not on file    Years of education: Not on file   Highest education level: Not on file  Occupational History   Not on file  Tobacco Use   Smoking status: Never   Smokeless tobacco: Never  Vaping Use   Vaping Use: Never used  Substance and Sexual Activity   Alcohol use: No   Drug use: No   Sexual activity: Yes  Other Topics Concern   Not on file  Social History Narrative   Not on file   Social Determinants of Health   Financial Resource Strain: Not on file  Food Insecurity: Not on file  Transportation Needs: Not on file  Physical Activity: Not on file  Stress: Not on file  Social Connections: Not on file  Intimate Partner Violence: Not on file    PHYSICAL EXAM  GENERAL EXAM/CONSTITUTIONAL: Vitals:  Vitals:   01/03/23 0851  BP: 120/78  Pulse: 77  Weight: 285 lb (129.3 kg)  Height: '5\' 7"'$  (1.702 m)   Body mass index is 44.64 kg/m. Wt Readings from Last 3 Encounters:  01/03/23 285 lb (129.3 kg)  12/20/22 285 lb (129.3 kg)  09/09/20 245 lb (111.1 kg)   Patient is in no distress; well developed, nourished and groomed; neck is supple  EYES: Visual fields full to confrontation, Extraocular movements intacts,  No results found.  MUSCULOSKELETAL: Gait, strength, tone, movements noted in Neurologic exam below  NEUROLOGIC: MENTAL STATUS:      No data to display         awake, alert, oriented to person, place and time recent and remote memory intact normal attention and concentration language fluent, comprehension intact, naming intact fund of knowledge appropriate  CRANIAL NERVE:  2nd, 3rd, 4th, 6th - Visual fields full to confrontation, extraocular muscles intact, no nystagmus 5th - facial sensation symmetric 7th - facial strength symmetric 8th - hearing intact 9th - palate elevates symmetrically, uvula midline 11th - shoulder shrug symmetric 12th - tongue protrusion midline  MOTOR:  normal bulk and tone, full strength in the BUE, BLE  SENSORY:  normal and  symmetric to light touch  COORDINATION:  finger-nose-finger, fine finger movements norma  GAIT/STATION:  normal   DIAGNOSTIC DATA (LABS, IMAGING, TESTING) - I reviewed patient records, labs, notes, testing and imaging myself where available.  Lab Results  Component Value Date   WBC 5.2 12/20/2022   HGB 11.1 (L) 12/20/2022   HCT 34.3 (L) 12/20/2022   MCV 82.3 12/20/2022   PLT 273 12/20/2022      Component Value Date/Time   NA 137 12/20/2022 1539   K 3.3 (L) 12/20/2022 1539   CL 105 12/20/2022 1539   CO2 26 12/20/2022 1539   GLUCOSE 96 12/20/2022 1539   BUN 13 12/20/2022 1539   CREATININE 0.75 12/20/2022 1539   CALCIUM 9.0 12/20/2022 1539  PROT 6.7 10/04/2022 2039   ALBUMIN 3.4 (L) 10/04/2022 2039   AST 21 10/04/2022 2039   ALT 17 10/04/2022 2039   ALKPHOS 65 10/04/2022 2039   BILITOT 0.5 10/04/2022 2039   GFRNONAA >60 12/20/2022 1539   GFRAA >60 12/07/2019 2340   No results found for: "CHOL", "HDL", "LDLCALC", "LDLDIRECT", "TRIG" No results found for: "HGBA1C" Lab Results  Component Value Date   VITAMINB12 458 01/01/2011   Lab Results  Component Value Date   TSH 3.259 02/09/2011     ASSESSMENT AND PLAN  52 y.o. year old female  with sleep apnea currently not on CPAP machine, arthritis, migraines, obesity who is presenting with complaints of seizure-like activity described as behavioral arrest, staring and losing track of time.  These episodes are very frequent to the point she is getting them up to 6 times per month.  I will start by getting a routine EEG.  Due to her complaint of headaches, we will also get an MRI brain with and without contrast and start her on Zonisamide.  I will contact the patient to go over the result.  If normal and she continued to have these events, will consider ambulatory EEG but if abnormal, then we will increase her Zonisamide.  This was discussed with the patient and she is comfortable with plan.  I have also advised her to restart  using her CPAP machine. I will see her in 6 months for follow-up or sooner if worse.   1. Seizure-like activity ([place])   2. Chronic migraine without aura without status migrainosus, not intractable     Patient Instructions  Start with zonisamide 50 mg nightly for 1 week then increase to 100 mg nightly Sumatriptan as needed for headaches Routine EEG MRI brain with and without contrast Please use the CPAP machine nightly Follow-up in 6 months or sooner if worse.    Per [facility] statutes, patients with seizures are not allowed to drive until they have been seizure-free for six months.  Other recommendations include using caution when using heavy equipment or power tools. Avoid working on ladders or at heights. Take showers instead of baths.  Do not swim alone.  Ensure the water temperature is not too high on the home water heater. Do not go swimming alone. Do not lock yourself in a room alone (i.e. bathroom). When caring for infants or small children, sit down when holding, feeding, or changing them to minimize risk of injury to the child in the event you have a seizure. Maintain good sleep hygiene. Avoid alcohol.  Also recommend adequate sleep, hydration, good diet and minimize stress.   During the Seizure  - First, ensure adequate ventilation and place patients on the floor on their left side  Loosen clothing around the neck and ensure the airway is patent. If the patient is clenching the teeth, do not force the mouth open with any object as this can cause severe damage - Remove all items from the surrounding that can be hazardous. The patient may be oblivious to what's happening and may not even know what he or she is doing. If the patient is confused and wandering, either gently guide him/her away and block access to outside areas - Reassure the individual and be comforting - Call 911. In most cases, the seizure ends before EMS arrives. However, there are cases when seizures may  last over 3 to 5 minutes. Or the individual may have developed breathing difficulties or severe injuries. If a  pregnant patient or a person with diabetes develops a seizure, it is prudent to call an ambulance. - Finally, if the patient does not regain full consciousness, then call EMS. Most patients will remain confused for about 45 to 90 minutes after a seizure, so you must use judgment in calling for help. - Avoid restraints but make sure the patient is in a bed with padded side rails - Place the individual in a lateral position with the neck slightly flexed; this will help the saliva drain from the mouth and prevent the tongue from falling backward - Remove all nearby furniture and other hazards from the area - Provide verbal assurance as the individual is regaining consciousness - Provide the patient with privacy if possible - Call for help and start treatment as ordered by the caregiver   After the Seizure (Postictal Stage)  After a seizure, most patients experience confusion, fatigue, muscle pain and/or a headache. Thus, one should permit the individual to sleep. For the next few days, reassurance is essential. Being calm and helping reorient the person is also of importance.  Most seizures are painless and end spontaneously. Seizures are not harmful to others but can lead to complications such as stress on the lungs, brain and the heart. Individuals with prior lung problems may develop labored breathing and respiratory distress.     Orders Placed This Encounter  Procedures   MR BRAIN W WO CONTRAST   EEG adult    Meds ordered this encounter  Medications   zonisamide (ZONEGRAN) 100 MG capsule    Sig: Take 1 capsule (100 mg total) by mouth daily.    Dispense:  30 capsule    Refill:  6   SUMAtriptan (IMITREX) 50 MG tablet    Sig: Take 1 tablet (50 mg total) by mouth every 2 (two) hours as needed for migraine. May repeat in 2 hours if headache persists or recurs.    Dispense:  10  tablet    Refill:  3    Return in about 6 months (around 07/04/2023).    Alric Ran, MD 01/03/2023, 2:39 PM  [facility] [address] [place], [place] [address] [phone]

## 2023-01-17 ENCOUNTER — Other Ambulatory Visit: Payer: Self-pay | Admitting: Neurology

## 2023-01-17 ENCOUNTER — Ambulatory Visit
Admission: RE | Admit: 2023-01-17 | Discharge: 2023-01-17 | Disposition: A | Discharge status: Home / Self Care | Payer: Medicaid Other | Source: Ambulatory Visit | Attending: Neurology | Admitting: Neurology

## 2023-01-17 DIAGNOSIS — R569 Unspecified convulsions: Secondary | ICD-10-CM

## 2023-01-17 DIAGNOSIS — D329 Benign neoplasm of meninges, unspecified: Secondary | ICD-10-CM

## 2023-01-17 DIAGNOSIS — G936 Cerebral edema: Secondary | ICD-10-CM

## 2023-01-17 MED ORDER — DEXAMETHASONE 2 MG PO TABS: 2.0000 mg | ORAL_TABLET | Freq: Every day | ORAL | 0 refills | Status: DC

## 2023-01-17 MED ORDER — LEVETIRACETAM 750 MG PO TABS: 750.0000 mg | ORAL_TABLET | Freq: Two times a day (BID) | ORAL | 11 refills | Status: DC

## 2023-01-17 MED ORDER — GADOPICLENOL 0.5 MMOL/ML IV SOLN
10.0000 mL | Freq: Once | INTRAVENOUS | Status: AC | PRN
  Administered 2023-01-17: 10 mL via INTRAVENOUS

## 2023-01-18 ENCOUNTER — Ambulatory Visit: Payer: Medicaid Other | Admitting: Neurology

## 2023-01-18 ENCOUNTER — Telehealth: Payer: Self-pay | Admitting: Neurology

## 2023-01-18 DIAGNOSIS — R569 Unspecified convulsions: Secondary | ICD-10-CM | POA: Diagnosis not present

## 2023-01-18 NOTE — Procedures (Signed)
    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 As the record progresses, the patient initially is in the waking state, but appears to enter the early stage II sleep during the recording, with rudimentary sleep spindles and vertex sharp wave activity seen. During the wakeful state, photic stimulation is performed, and no abnormal responses were seen. Hyperventilation was also performed, no abnormal response seen. No epileptiform discharges seen during this recording. There was no focal slowing.   Abnormality: None   Impression: This is a normal EEG recording in the waking and sleeping state. No evidence of interictal epileptiform discharges. Normal EEGs, however, do not rule out epilepsy.    Alric Ran, MD [facility]

## 2023-01-18 NOTE — Telephone Encounter (Signed)
Referral sent to Dr. Zada Finders at [facility], phone # [phone].

## 2023-01-19 DIAGNOSIS — Z6841 Body Mass Index (BMI) 40.0 and over, adult: Secondary | ICD-10-CM | POA: Diagnosis not present

## 2023-01-19 DIAGNOSIS — D496 Neoplasm of unspecified behavior of brain: Secondary | ICD-10-CM | POA: Diagnosis not present

## 2023-01-23 ENCOUNTER — Telehealth: Payer: Self-pay

## 2023-01-23 NOTE — Telephone Encounter (Signed)
Received [employer] paper work. I have sent a mychart message to confirm what the form is needing and what information needs to be completed.

## 2023-01-24 ENCOUNTER — Other Ambulatory Visit: Payer: Self-pay | Admitting: Neurological Surgery

## 2023-01-30 NOTE — Pre-Procedure Instructions (Signed)
Surgical Instructions    Your procedure is scheduled on Thursday, February 15th.  Report to [facility] Main Entrance "A" at 5:30 A.M., then check in with the Admitting office.  Call this number if you have problems the morning of surgery:  [phone]  If you have any questions prior to your surgery date call [phone]: Open Monday-Friday 8am-4pm If you experience any cold or flu symptoms such as cough, fever, chills, shortness of breath, etc. between now and your scheduled surgery, please notify us at the above number.     Remember:  Do not eat after midnight the night before your surgery  You may drink clear liquids until 4:30 a.m. the morning of your surgery.   Clear liquids allowed are: Water, Non-Citrus Juices (without pulp), Carbonated Beverages, Clear Tea, Black Coffee Only (NO MILK, CREAM OR POWDERED CREAMER of any kind), and Gatorade.    Take these medicines the morning of surgery with A SIP OF WATER  dexamethasone (DECADRON)  DULoxetine (CYMBALTA)  levETIRAcetam (KEPPRA)   As of today, STOP taking any Aspirin (unless otherwise instructed by your surgeon) Aleve, Naproxen, Ibuprofen, Motrin, Advil, Goody's, BC's, all herbal medications, fish oil, and all vitamins.                     Do NOT Smoke (Tobacco/Vaping) for 24 hours prior to your procedure.  If you use a CPAP at night, you may bring your mask/headgear for your overnight stay.   Contacts, glasses, piercing's, hearing aid's, dentures or partials may not be worn into surgery, please bring cases for these belongings.    For patients admitted to the hospital, discharge time will be determined by your treatment team.   Patients discharged the day of surgery will not be allowed to drive home, and someone needs to stay with them for 24 hours.  SURGICAL WAITING ROOM VISITATION Patients having surgery or a procedure may have no more than 2 support people in the waiting area - these visitors may rotate.   Children  under the age of 34 must have an adult with them who is not the patient. If the patient needs to stay at the hospital during part of their recovery, the visitor guidelines for inpatient rooms apply. Pre-op nurse will coordinate an appropriate time for 1 support person to accompany patient in pre-op.  This support person may not rotate.   Please refer to the [facility] website for the visitor guidelines for Inpatients (after your surgery is over and you are in a regular room).    Special instructions:   [place]- Preparing For Surgery  Before surgery, you can play an important role. Because skin is not sterile, your skin needs to be as free of germs as possible. You can reduce the number of germs on your skin by washing with CHG (chlorahexidine gluconate) Soap before surgery.  CHG is an antiseptic cleaner which kills germs and bonds with the skin to continue killing germs even after washing.    Oral Hygiene is also important to reduce your risk of infection.  Remember - BRUSH YOUR TEETH THE MORNING OF SURGERY WITH YOUR REGULAR TOOTHPASTE  Please do not use if you have an allergy to CHG or antibacterial soaps. If your skin becomes reddened/irritated stop using the CHG.  Do not shave (including legs and underarms) for at least 48 hours prior to first CHG shower. It is OK to shave your face.  Please follow these instructions carefully.   Shower the [employer]  BEFORE SURGERY and the MORNING OF SURGERY  If you chose to wash your hair, wash your hair first as usual with your normal shampoo.  After you shampoo, rinse your hair and body thoroughly to remove the shampoo.  Use CHG Soap as you would any other liquid soap. You can apply CHG directly to the skin and wash gently with a scrungie or a clean washcloth.   Apply the CHG Soap to your body ONLY FROM THE NECK DOWN.  Do not use on open wounds or open sores. Avoid contact with your eyes, ears, mouth and genitals (private parts). Wash Face and  genitals (private parts)  with your normal soap.   Wash thoroughly, paying special attention to the area where your surgery will be performed.  Thoroughly rinse your body with warm water from the neck down.  DO NOT shower/wash with your normal soap after using and rinsing off the CHG Soap.  Pat yourself dry with a CLEAN TOWEL.  Wear CLEAN PAJAMAS to bed the night before surgery  Place CLEAN SHEETS on your bed the night before your surgery  DO NOT SLEEP WITH PETS.   Day of Surgery: Take a shower with CHG soap. Do not wear jewelry or makeup Do not wear lotions, powders, perfumes, or deodorant. Do not shave 48 hours prior to surgery.   Do not bring valuables to the hospital.  [facility] is not responsible for any belongings or valuables. Do not wear nail polish, gel polish, artificial nails, or any other type of covering on natural nails (fingers and toes) If you have artificial nails or gel coating that need to be removed by a nail salon, please have this removed prior to surgery. Artificial nails or gel coating may interfere with anesthesia's ability to adequately monitor your vital signs. Wear Clean/Comfortable clothing the morning of surgery Remember to brush your teeth WITH YOUR REGULAR TOOTHPASTE.   Please read over the following fact sheets that you were given.    If you received a COVID test during your pre-op visit  it is requested that you wear a mask when out in public, stay away from anyone that may not be feeling well and notify your surgeon if you develop symptoms. If you have been in contact with anyone that has tested positive in the last 10 days please notify you surgeon.

## 2023-01-31 ENCOUNTER — Encounter (HOSPITAL_COMMUNITY): Payer: Self-pay

## 2023-01-31 ENCOUNTER — Encounter (HOSPITAL_COMMUNITY)
Admission: RE | Admit: 2023-01-31 | Discharge: 2023-01-31 | Disposition: A | Discharge status: Home / Self Care | Payer: Medicaid Other | Source: Ambulatory Visit | Attending: Neurological Surgery | Admitting: Neurological Surgery

## 2023-01-31 ENCOUNTER — Other Ambulatory Visit: Payer: Self-pay

## 2023-01-31 VITALS — BP 126/71 | HR 73 | Temp 98.1°F | Resp 19 | Ht 67.0 in | Wt 295.0 lb

## 2023-01-31 DIAGNOSIS — Z01818 Encounter for other preprocedural examination: Secondary | ICD-10-CM

## 2023-01-31 DIAGNOSIS — Z01812 Encounter for preprocedural laboratory examination: Secondary | ICD-10-CM | POA: Insufficient documentation

## 2023-01-31 HISTORY — DX: Headache, unspecified: R51.9

## 2023-01-31 HISTORY — DX: Anemia, unspecified: D64.9

## 2023-01-31 HISTORY — DX: Cardiac murmur, unspecified: R01.1

## 2023-01-31 HISTORY — DX: Unspecified convulsions: R56.9

## 2023-01-31 HISTORY — DX: Sleep apnea, unspecified: G47.30

## 2023-01-31 HISTORY — DX: Anxiety disorder, unspecified: F41.9

## 2023-01-31 LAB — BASIC METABOLIC PANEL
Anion gap: 10 (ref 5–15)
BUN: 13 mg/dL (ref 6–20)
CO2: 25 mmol/L (ref 22–32)
Calcium: 8.7 mg/dL — ABNORMAL LOW (ref 8.9–10.3)
Chloride: 104 mmol/L (ref 98–111)
Creatinine, Ser: 0.83 mg/dL (ref 0.44–1.00)
GFR, Estimated: >60 mL/min (ref >60)
Glucose, Bld: 87 mg/dL (ref 70–99)
Potassium: 3.7 mmol/L (ref 3.5–5.1)
Sodium: 139 mmol/L (ref 135–145)

## 2023-01-31 LAB — TYPE AND SCREEN
ABO/RH(D): B POS
Antibody Screen: NEGATIVE

## 2023-01-31 LAB — CBC
HCT: 33 % — ABNORMAL LOW (ref 36.0–46.0)
Hemoglobin: 10.7 g/dL — ABNORMAL LOW (ref 12.0–15.0)
MCH: 26.7 pg (ref 26.0–34.0)
MCHC: 32.4 g/dL (ref 30.0–36.0)
MCV: 82.3 fL (ref 80.0–100.0)
Platelets: 288 10*3/uL (ref 150–400)
RBC: 4.01 MIL/uL (ref 3.87–5.11)
RDW: 14.5 % (ref 11.5–15.5)
WBC: 7.1 10*3/uL (ref 4.0–10.5)
nRBC: 0 % (ref 0.0–0.2)

## 2023-01-31 NOTE — Progress Notes (Signed)
PCP - Daisy Lazar, PA at [facility] Cardiologist -   PPM/ICD - n/a Device Orders - n/a Rep Notified - n/a  Chest x-ray - n/a EKG - 12/20/22 Stress Test - denies ECHO - denies Cardiac Cath - denies  Sleep Study - +OSA. Does not wear CPAP nightly  Fasting Blood Sugar - n/a Checks Blood Sugar _____ times a day- n/a  Last dose of GLP1 agonist-  n/a GLP1 instructions: n/a  Blood Thinner Instructions: n/a Aspirin Instructions: n/a  ERAS Protcol -  Clear liquids until 0430 am day of surgery PRE-SURGERY Ensure or G2- None ordered  COVID TEST- n/a   Anesthesia review: Yes. Karoline Caldwell, PA over to PAT to assess patient during PAT visit for heart murmur.   Patient denies shortness of breath, fever, cough and chest pain at PAT appointment   All instructions explained to the patient, with a verbal understanding of the material. Patient agrees to go over the instructions while at home for a better understanding. The opportunity to ask questions was provided.

## 2023-02-02 ENCOUNTER — Encounter (HOSPITAL_COMMUNITY): Payer: Self-pay | Admitting: Neurological Surgery

## 2023-02-08 ENCOUNTER — Inpatient Hospital Stay (HOSPITAL_COMMUNITY)
Admission: RE | Admit: 2023-02-08 | Discharge: 2023-02-15 | DRG: 026 | Disposition: A | Discharge status: Rehabilitation Facility | Payer: Medicaid Other | Attending: Neurological Surgery | Admitting: Neurological Surgery

## 2023-02-08 ENCOUNTER — Other Ambulatory Visit: Payer: Self-pay

## 2023-02-08 ENCOUNTER — Inpatient Hospital Stay (HOSPITAL_COMMUNITY): Payer: Medicaid Other | Admitting: Physician Assistant

## 2023-02-08 ENCOUNTER — Inpatient Hospital Stay (HOSPITAL_COMMUNITY): Payer: Medicaid Other | Admitting: Vascular Surgery

## 2023-02-08 ENCOUNTER — Encounter (HOSPITAL_COMMUNITY): Payer: Self-pay | Admitting: Neurological Surgery

## 2023-02-08 ENCOUNTER — Inpatient Hospital Stay (HOSPITAL_COMMUNITY): Payer: Medicaid Other

## 2023-02-08 ENCOUNTER — Encounter (HOSPITAL_COMMUNITY)
Admission: RE | Disposition: A | Discharge status: Rehabilitation Facility | Payer: Self-pay | Source: Home / Self Care | Attending: Neurological Surgery

## 2023-02-08 DIAGNOSIS — G4089 Other seizures: Secondary | ICD-10-CM | POA: Diagnosis present

## 2023-02-08 DIAGNOSIS — F32A Depression, unspecified: Secondary | ICD-10-CM | POA: Diagnosis present

## 2023-02-08 DIAGNOSIS — D496 Neoplasm of unspecified behavior of brain: Principal | ICD-10-CM | POA: Diagnosis present

## 2023-02-08 DIAGNOSIS — D320 Benign neoplasm of cerebral meninges: Principal | ICD-10-CM | POA: Diagnosis present

## 2023-02-08 DIAGNOSIS — F418 Other specified anxiety disorders: Secondary | ICD-10-CM

## 2023-02-08 DIAGNOSIS — F4323 Adjustment disorder with mixed anxiety and depressed mood: Secondary | ICD-10-CM | POA: Diagnosis not present

## 2023-02-08 DIAGNOSIS — K5900 Constipation, unspecified: Secondary | ICD-10-CM | POA: Diagnosis not present

## 2023-02-08 DIAGNOSIS — Z9889 Other specified postprocedural states: Secondary | ICD-10-CM | POA: Diagnosis not present

## 2023-02-08 DIAGNOSIS — I10 Essential (primary) hypertension: Secondary | ICD-10-CM | POA: Diagnosis not present

## 2023-02-08 DIAGNOSIS — C719 Malignant neoplasm of brain, unspecified: Secondary | ICD-10-CM | POA: Diagnosis not present

## 2023-02-08 DIAGNOSIS — Z803 Family history of malignant neoplasm of breast: Secondary | ICD-10-CM

## 2023-02-08 DIAGNOSIS — D630 Anemia in neoplastic disease: Secondary | ICD-10-CM | POA: Diagnosis not present

## 2023-02-08 DIAGNOSIS — K219 Gastro-esophageal reflux disease without esophagitis: Secondary | ICD-10-CM | POA: Diagnosis present

## 2023-02-08 DIAGNOSIS — D649 Anemia, unspecified: Secondary | ICD-10-CM | POA: Diagnosis not present

## 2023-02-08 DIAGNOSIS — Z6841 Body Mass Index (BMI) 40.0 and over, adult: Secondary | ICD-10-CM

## 2023-02-08 DIAGNOSIS — E871 Hypo-osmolality and hyponatremia: Secondary | ICD-10-CM | POA: Diagnosis not present

## 2023-02-08 DIAGNOSIS — Z885 Allergy status to narcotic agent status: Secondary | ICD-10-CM

## 2023-02-08 DIAGNOSIS — J3489 Other specified disorders of nose and nasal sinuses: Secondary | ICD-10-CM | POA: Diagnosis not present

## 2023-02-08 DIAGNOSIS — C729 Malignant neoplasm of central nervous system, unspecified: Secondary | ICD-10-CM | POA: Diagnosis not present

## 2023-02-08 DIAGNOSIS — H5462 Unqualified visual loss, left eye, normal vision right eye: Secondary | ICD-10-CM | POA: Diagnosis present

## 2023-02-08 DIAGNOSIS — Z483 Aftercare following surgery for neoplasm: Secondary | ICD-10-CM | POA: Diagnosis not present

## 2023-02-08 DIAGNOSIS — F419 Anxiety disorder, unspecified: Secondary | ICD-10-CM | POA: Diagnosis present

## 2023-02-08 DIAGNOSIS — R5381 Other malaise: Secondary | ICD-10-CM | POA: Diagnosis not present

## 2023-02-08 HISTORY — PX: CRANIOTOMY: SHX93

## 2023-02-08 HISTORY — PX: APPLICATION OF CRANIAL NAVIGATION: SHX6578

## 2023-02-08 LAB — POCT I-STAT 7, (LYTES, BLD GAS, ICA,H+H)
Acid-base deficit: 2 mmol/L (ref 0.0–2.0)
Acid-base deficit: 4 mmol/L — ABNORMAL HIGH (ref 0.0–2.0)
Bicarbonate: 22.1 mmol/L (ref 20.0–28.0)
Bicarbonate: 20.3 mmol/L (ref 20.0–28.0)
Calcium, Ion: 1.21 mmol/L (ref 1.15–1.40)
Calcium, Ion: 1.18 mmol/L (ref 1.15–1.40)
HCT: 29 % — ABNORMAL LOW (ref 36.0–46.0)
HCT: 27 % — ABNORMAL LOW (ref 36.0–46.0)
Hemoglobin: 9.9 g/dL — ABNORMAL LOW (ref 12.0–15.0)
Hemoglobin: 9.2 g/dL — ABNORMAL LOW (ref 12.0–15.0)
O2 Saturation: 100 %
O2 Saturation: 100 %
Potassium: 4.1 mmol/L (ref 3.5–5.1)
Potassium: 4.3 mmol/L (ref 3.5–5.1)
Sodium: 140 mmol/L (ref 135–145)
Sodium: 139 mmol/L (ref 135–145)
TCO2: 23 mmol/L (ref 22–32)
TCO2: 21 mmol/L — ABNORMAL LOW (ref 22–32)
pCO2 arterial: 34.6 mmHg (ref 32–48)
pCO2 arterial: 31.2 mmHg — ABNORMAL LOW (ref 32–48)
pH, Arterial: 7.413 (ref 7.35–7.45)
pH, Arterial: 7.422 (ref 7.35–7.45)
pO2, Arterial: 241 mmHg — ABNORMAL HIGH (ref 83–108)
pO2, Arterial: 238 mmHg — ABNORMAL HIGH (ref 83–108)

## 2023-02-08 LAB — CBC
HCT: 29.4 % — ABNORMAL LOW (ref 36.0–46.0)
Hemoglobin: 10 g/dL — ABNORMAL LOW (ref 12.0–15.0)
MCH: 27.7 pg (ref 26.0–34.0)
MCHC: 34 g/dL (ref 30.0–36.0)
MCV: 81.4 fL (ref 80.0–100.0)
Platelets: 265 10*3/uL (ref 150–400)
RBC: 3.61 MIL/uL — ABNORMAL LOW (ref 3.87–5.11)
RDW: 14.3 % (ref 11.5–15.5)
WBC: 9.7 10*3/uL (ref 4.0–10.5)
nRBC: 0 % (ref 0.0–0.2)

## 2023-02-08 LAB — CREATININE, SERUM
Creatinine, Ser: 0.94 mg/dL (ref 0.44–1.00)
GFR, Estimated: >60 mL/min (ref >60)

## 2023-02-08 LAB — GLUCOSE, CAPILLARY: Glucose-Capillary: 136 mg/dL — ABNORMAL HIGH (ref 70–99)

## 2023-02-08 SURGERY — CRANIOTOMY TUMOR EXCISION
Anesthesia: General

## 2023-02-08 MED ORDER — THROMBIN 5000 UNITS EX SOLR
CUTANEOUS | Status: AC
  Filled 2023-02-08: qty 5000

## 2023-02-08 MED ORDER — ONDANSETRON HCL 4 MG PO TABS
4.0000 mg | ORAL_TABLET | ORAL | Status: DC | PRN
  Administered 2023-02-14: 4 mg via ORAL
  Filled 2023-02-08: qty 1

## 2023-02-08 MED ORDER — CHLORHEXIDINE GLUCONATE CLOTH 2 % EX PADS: 6.0000 | MEDICATED_PAD | Freq: Once | CUTANEOUS | Status: DC

## 2023-02-08 MED ORDER — DEXAMETHASONE SODIUM PHOSPHATE 10 MG/ML IJ SOLN
INTRAMUSCULAR | Status: DC | PRN
  Administered 2023-02-08: 10 mg via INTRAVENOUS

## 2023-02-08 MED ORDER — ONDANSETRON HCL 4 MG/2ML IJ SOLN
INTRAMUSCULAR | Status: AC
  Filled 2023-02-08: qty 2

## 2023-02-08 MED ORDER — CEFAZOLIN SODIUM 1 G IJ SOLR
INTRAMUSCULAR | Status: AC
  Filled 2023-02-08: qty 30

## 2023-02-08 MED ORDER — OXYCODONE HCL 5 MG/5ML PO SOLN: 5.0000 mg | Freq: Once | ORAL | Status: DC | PRN

## 2023-02-08 MED ORDER — PROMETHAZINE HCL 12.5 MG PO TABS
12.5000 mg | ORAL_TABLET | ORAL | Status: DC | PRN
  Administered 2023-02-12 – 2023-02-14 (×3): 25 mg via ORAL
  Filled 2023-02-08 (×5): qty 2

## 2023-02-08 MED ORDER — ACETAMINOPHEN 650 MG RE SUPP
650.0000 mg | RECTAL | Status: DC | PRN
  Administered 2023-02-09 (×2): 650 mg via RECTAL
  Filled 2023-02-08 (×2): qty 1

## 2023-02-08 MED ORDER — ONDANSETRON HCL 4 MG/2ML IJ SOLN
4.0000 mg | INTRAMUSCULAR | Status: DC | PRN
  Administered 2023-02-08 – 2023-02-15 (×9): 4 mg via INTRAVENOUS
  Filled 2023-02-08 (×9): qty 2

## 2023-02-08 MED ORDER — ACETAMINOPHEN 10 MG/ML IV SOLN
INTRAVENOUS | Status: AC
  Filled 2023-02-08: qty 100

## 2023-02-08 MED ORDER — DULOXETINE HCL 60 MG PO CPEP
60.0000 mg | ORAL_CAPSULE | Freq: Every day | ORAL | Status: DC
  Administered 2023-02-09 – 2023-02-15 (×7): 60 mg via ORAL
  Filled 2023-02-08 (×7): qty 1

## 2023-02-08 MED ORDER — HYDROMORPHONE HCL 1 MG/ML IJ SOLN
INTRAMUSCULAR | Status: AC
  Filled 2023-02-08: qty 1

## 2023-02-08 MED ORDER — ROCURONIUM BROMIDE 10 MG/ML (PF) SYRINGE
PREFILLED_SYRINGE | INTRAVENOUS | Status: AC
  Filled 2023-02-08: qty 20

## 2023-02-08 MED ORDER — FENTANYL CITRATE (PF) 250 MCG/5ML IJ SOLN
INTRAMUSCULAR | Status: DC | PRN
  Administered 2023-02-08: 50 ug via INTRAVENOUS
  Administered 2023-02-08: 150 ug via INTRAVENOUS
  Administered 2023-02-08: 50 ug via INTRAVENOUS
  Administered 2023-02-08: 100 ug via INTRAVENOUS

## 2023-02-08 MED ORDER — FENTANYL CITRATE (PF) 250 MCG/5ML IJ SOLN
INTRAMUSCULAR | Status: AC
  Filled 2023-02-08: qty 5

## 2023-02-08 MED ORDER — ROCURONIUM BROMIDE 10 MG/ML (PF) SYRINGE
PREFILLED_SYRINGE | INTRAVENOUS | Status: DC | PRN
  Administered 2023-02-08 (×3): 50 mg via INTRAVENOUS
  Administered 2023-02-08: 20 mg via INTRAVENOUS
  Administered 2023-02-08: 80 mg via INTRAVENOUS

## 2023-02-08 MED ORDER — DOCUSATE SODIUM 100 MG PO CAPS
100.0000 mg | ORAL_CAPSULE | Freq: Two times a day (BID) | ORAL | Status: DC
  Administered 2023-02-09 – 2023-02-15 (×11): 100 mg via ORAL
  Filled 2023-02-08 (×11): qty 1

## 2023-02-08 MED ORDER — ORAL CARE MOUTH RINSE: 15.0000 mL | Freq: Once | OROMUCOSAL | Status: AC

## 2023-02-08 MED ORDER — HYDROCODONE-ACETAMINOPHEN 5-325 MG PO TABS
1.0000 | ORAL_TABLET | ORAL | Status: DC | PRN
  Administered 2023-02-09 – 2023-02-14 (×18): 1 via ORAL
  Filled 2023-02-08 (×18): qty 1

## 2023-02-08 MED ORDER — LIDOCAINE 2% (20 MG/ML) 5 ML SYRINGE
INTRAMUSCULAR | Status: AC
  Filled 2023-02-08: qty 10

## 2023-02-08 MED ORDER — LABETALOL HCL 5 MG/ML IV SOLN
10.0000 mg | INTRAVENOUS | Status: DC | PRN
  Administered 2023-02-08 (×2): 40 mg via INTRAVENOUS
  Administered 2023-02-08 (×2): 20 mg via INTRAVENOUS
  Filled 2023-02-08 (×3): qty 8
  Filled 2023-02-08 (×2): qty 4

## 2023-02-08 MED ORDER — CEFAZOLIN IN SODIUM CHLORIDE 3-0.9 GM/100ML-% IV SOLN
INTRAVENOUS | Status: AC
  Filled 2023-02-08: qty 100

## 2023-02-08 MED ORDER — GADOBUTROL 1 MMOL/ML IV SOLN
10.0000 mL | Freq: Once | INTRAVENOUS | Status: AC | PRN
  Administered 2023-02-08: 10 mL via INTRAVENOUS

## 2023-02-08 MED ORDER — ESMOLOL HCL 100 MG/10ML IV SOLN
INTRAVENOUS | Status: AC
  Filled 2023-02-08: qty 20

## 2023-02-08 MED ORDER — CEFAZOLIN IN SODIUM CHLORIDE 3-0.9 GM/100ML-% IV SOLN
3.0000 g | INTRAVENOUS | Status: AC
  Administered 2023-02-08 (×2): 3 g via INTRAVENOUS

## 2023-02-08 MED ORDER — LEVETIRACETAM 750 MG PO TABS
750.0000 mg | ORAL_TABLET | Freq: Two times a day (BID) | ORAL | Status: DC
  Administered 2023-02-09 – 2023-02-15 (×13): 750 mg via ORAL
  Filled 2023-02-08 (×13): qty 1

## 2023-02-08 MED ORDER — CHLORHEXIDINE GLUCONATE CLOTH 2 % EX PADS
6.0000 | MEDICATED_PAD | Freq: Every day | CUTANEOUS | Status: DC
  Administered 2023-02-08 – 2023-02-14 (×6): 6 via TOPICAL

## 2023-02-08 MED ORDER — ONDANSETRON HCL 4 MG/2ML IJ SOLN
INTRAMUSCULAR | Status: DC | PRN
  Administered 2023-02-08: 4 mg via INTRAVENOUS

## 2023-02-08 MED ORDER — ESMOLOL HCL 100 MG/10ML IV SOLN
INTRAVENOUS | Status: DC | PRN
  Administered 2023-02-08: 30 mg via INTRAVENOUS
  Administered 2023-02-08: 50 mg via INTRAVENOUS
  Administered 2023-02-08: 40 mg via INTRAVENOUS
  Administered 2023-02-08: 50 mg via INTRAVENOUS

## 2023-02-08 MED ORDER — HEPARIN SODIUM (PORCINE) 5000 UNIT/ML IJ SOLN
5000.0000 [IU] | Freq: Three times a day (TID) | INTRAMUSCULAR | Status: DC
  Administered 2023-02-10 – 2023-02-15 (×17): 5000 [IU] via SUBCUTANEOUS
  Filled 2023-02-08 (×17): qty 1

## 2023-02-08 MED ORDER — THROMBIN 5000 UNITS EX SOLR
OROMUCOSAL | Status: DC | PRN
  Administered 2023-02-08: 5 mL via TOPICAL

## 2023-02-08 MED ORDER — ONDANSETRON HCL 4 MG/2ML IJ SOLN
INTRAMUSCULAR | Status: AC
  Filled 2023-02-08: qty 4

## 2023-02-08 MED ORDER — BACITRACIN ZINC 500 UNIT/GM EX OINT
TOPICAL_OINTMENT | CUTANEOUS | Status: DC | PRN
  Administered 2023-02-08: 1 via TOPICAL

## 2023-02-08 MED ORDER — SODIUM CHLORIDE 0.9 % IV SOLN
0.1500 ug/kg/min | INTRAVENOUS | Status: DC
  Administered 2023-02-08: .05 ug/kg/min via INTRAVENOUS
  Filled 2023-02-08 (×3): qty 2000

## 2023-02-08 MED ORDER — LIDOCAINE-EPINEPHRINE 1 %-1:100000 IJ SOLN
INTRAMUSCULAR | Status: DC | PRN
  Administered 2023-02-08: 10 mL

## 2023-02-08 MED ORDER — LIDOCAINE-EPINEPHRINE 1 %-1:100000 IJ SOLN
INTRAMUSCULAR | Status: AC
  Filled 2023-02-08: qty 1

## 2023-02-08 MED ORDER — SODIUM CHLORIDE 0.9 % IV SOLN
750.0000 mg | Freq: Once | INTRAVENOUS | Status: AC
  Administered 2023-02-08: 750 mg via INTRAVENOUS
  Filled 2023-02-08: qty 7.5

## 2023-02-08 MED ORDER — LEVETIRACETAM IN NACL 500 MG/100ML IV SOLN
500.0000 mg | Freq: Once | INTRAVENOUS | Status: AC
  Administered 2023-02-08: 500 mg via INTRAVENOUS
  Filled 2023-02-08: qty 100

## 2023-02-08 MED ORDER — CHLORHEXIDINE GLUCONATE 0.12 % MT SOLN
OROMUCOSAL | Status: AC
  Administered 2023-02-08: 15 mL via OROMUCOSAL
  Filled 2023-02-08: qty 15

## 2023-02-08 MED ORDER — LACTATED RINGERS IV SOLN: INTRAVENOUS | Status: DC

## 2023-02-08 MED ORDER — PHENYLEPHRINE 80 MCG/ML (10ML) SYRINGE FOR IV PUSH (FOR BLOOD PRESSURE SUPPORT)
PREFILLED_SYRINGE | INTRAVENOUS | Status: DC | PRN
  Administered 2023-02-08: 40 ug via INTRAVENOUS
  Administered 2023-02-08: 80 ug via INTRAVENOUS

## 2023-02-08 MED ORDER — DEXAMETHASONE SODIUM PHOSPHATE 10 MG/ML IJ SOLN
INTRAMUSCULAR | Status: AC
  Filled 2023-02-08: qty 2

## 2023-02-08 MED ORDER — THROMBIN 20000 UNITS EX SOLR
CUTANEOUS | Status: AC
  Filled 2023-02-08: qty 20000

## 2023-02-08 MED ORDER — SUGAMMADEX SODIUM 200 MG/2ML IV SOLN
INTRAVENOUS | Status: DC | PRN
  Administered 2023-02-08: 535.2 mg via INTRAVENOUS

## 2023-02-08 MED ORDER — SODIUM CHLORIDE 0.9 % IR SOLN
Status: DC | PRN
  Administered 2023-02-08: 1000 mL

## 2023-02-08 MED ORDER — HYDROMORPHONE HCL 1 MG/ML IJ SOLN
0.5000 mg | INTRAMUSCULAR | Status: DC | PRN
  Administered 2023-02-08 – 2023-02-14 (×9): 0.5 mg via INTRAVENOUS
  Filled 2023-02-08: qty 1
  Filled 2023-02-08 (×3): qty 0.5
  Filled 2023-02-08: qty 1
  Filled 2023-02-08 (×2): qty 0.5
  Filled 2023-02-08: qty 1
  Filled 2023-02-08: qty 0.5

## 2023-02-08 MED ORDER — 0.9 % SODIUM CHLORIDE (POUR BTL) OPTIME
TOPICAL | Status: DC | PRN
  Administered 2023-02-08: 3000 mL

## 2023-02-08 MED ORDER — FENTANYL CITRATE (PF) 100 MCG/2ML IJ SOLN
INTRAMUSCULAR | Status: AC
  Filled 2023-02-08: qty 2

## 2023-02-08 MED ORDER — PHENYLEPHRINE HCL-NACL 20-0.9 MG/250ML-% IV SOLN
INTRAVENOUS | Status: DC | PRN
  Administered 2023-02-08: 50 ug/min via INTRAVENOUS

## 2023-02-08 MED ORDER — PROPOFOL 500 MG/50ML IV EMUL
INTRAVENOUS | Status: DC | PRN
  Administered 2023-02-08: 75 ug/kg/min via INTRAVENOUS

## 2023-02-08 MED ORDER — ACETAMINOPHEN 160 MG/5ML PO SOLN: 1000.0000 mg | Freq: Once | ORAL | Status: DC | PRN

## 2023-02-08 MED ORDER — PROPOFOL 10 MG/ML IV BOLUS
INTRAVENOUS | Status: DC | PRN
  Administered 2023-02-08: 160 mg via INTRAVENOUS
  Administered 2023-02-08: 50 mg via INTRAVENOUS

## 2023-02-08 MED ORDER — OXYCODONE HCL 5 MG PO TABS: 5.0000 mg | ORAL_TABLET | Freq: Once | ORAL | Status: DC | PRN

## 2023-02-08 MED ORDER — CLEVIDIPINE BUTYRATE 0.5 MG/ML IV EMUL
INTRAVENOUS | Status: DC | PRN
  Administered 2023-02-08: 1 mg/h via INTRAVENOUS

## 2023-02-08 MED ORDER — BACITRACIN ZINC 500 UNIT/GM EX OINT
TOPICAL_OINTMENT | CUTANEOUS | Status: AC
  Filled 2023-02-08: qty 28.35

## 2023-02-08 MED ORDER — PROPOFOL 1000 MG/100ML IV EMUL
INTRAVENOUS | Status: AC
  Filled 2023-02-08: qty 800

## 2023-02-08 MED ORDER — FAMOTIDINE IN NACL 20-0.9 MG/50ML-% IV SOLN
20.0000 mg | Freq: Two times a day (BID) | INTRAVENOUS | Status: DC
  Administered 2023-02-08: 20 mg via INTRAVENOUS
  Filled 2023-02-08: qty 50

## 2023-02-08 MED ORDER — ACETAMINOPHEN 10 MG/ML IV SOLN
1000.0000 mg | Freq: Once | INTRAVENOUS | Status: DC | PRN
  Administered 2023-02-08: 1000 mg via INTRAVENOUS

## 2023-02-08 MED ORDER — THROMBIN 20000 UNITS EX SOLR
CUTANEOUS | Status: DC | PRN
  Administered 2023-02-08: 20 mL via TOPICAL

## 2023-02-08 MED ORDER — ACETAMINOPHEN 500 MG PO TABS: 1000.0000 mg | ORAL_TABLET | Freq: Once | ORAL | Status: DC | PRN

## 2023-02-08 MED ORDER — EPHEDRINE SULFATE-NACL 50-0.9 MG/10ML-% IV SOSY
PREFILLED_SYRINGE | INTRAVENOUS | Status: DC | PRN
  Administered 2023-02-08: 40 mg via INTRAVENOUS

## 2023-02-08 MED ORDER — POLYETHYLENE GLYCOL 3350 17 G PO PACK
17.0000 g | PACK | Freq: Every day | ORAL | Status: DC | PRN
  Administered 2023-02-12: 17 g via ORAL
  Filled 2023-02-08: qty 1

## 2023-02-08 MED ORDER — LIDOCAINE 2% (20 MG/ML) 5 ML SYRINGE
INTRAMUSCULAR | Status: DC | PRN
  Administered 2023-02-08: 60 mg via INTRAVENOUS

## 2023-02-08 MED ORDER — ACETAMINOPHEN 325 MG PO TABS
650.0000 mg | ORAL_TABLET | ORAL | Status: DC | PRN
  Administered 2023-02-09 – 2023-02-14 (×7): 650 mg via ORAL
  Filled 2023-02-08 (×7): qty 2

## 2023-02-08 MED ORDER — FENTANYL CITRATE (PF) 100 MCG/2ML IJ SOLN
25.0000 ug | INTRAMUSCULAR | Status: DC | PRN
  Administered 2023-02-08: 50 ug via INTRAVENOUS

## 2023-02-08 MED ORDER — CHLORHEXIDINE GLUCONATE 0.12 % MT SOLN: 15.0000 mL | Freq: Once | OROMUCOSAL | Status: AC

## 2023-02-08 MED ORDER — CEFAZOLIN SODIUM-DEXTROSE 2-4 GM/100ML-% IV SOLN
2.0000 g | Freq: Three times a day (TID) | INTRAVENOUS | Status: AC
  Administered 2023-02-08 – 2023-02-09 (×2): 2 g via INTRAVENOUS
  Filled 2023-02-08 (×2): qty 100

## 2023-02-08 SURGICAL SUPPLY — 103 items
ADH SKN CLS APL DERMABOND .7 (GAUZE/BANDAGES/DRESSINGS) ×4
APL SKNCLS STERI-STRIP NONHPOA (GAUZE/BANDAGES/DRESSINGS)
BAG COUNTER SPONGE SURGICOUNT (BAG) ×2 IMPLANT
BAG SPNG CNTER NS LX DISP (BAG) ×6
BAND INSRT 18 STRL LF DISP RB (MISCELLANEOUS) ×4
BAND RUBBER #18 3X1/16 STRL (MISCELLANEOUS) IMPLANT
BENZOIN TINCTURE PRP APPL 2/3 (GAUZE/BANDAGES/DRESSINGS) IMPLANT
BIT DRILL 2.5MM SMALL QC EVOS (BIT) ×2 IMPLANT
BLADE CLIPPER SURG (BLADE) ×2 IMPLANT
BLADE SAW GIGLI 16 STRL (MISCELLANEOUS) IMPLANT
BLADE SURG 15 STRL LF DISP TIS (BLADE) IMPLANT
BLADE SURG 15 STRL SS (BLADE)
BNDG CMPR 75X41 PLY HI ABS (GAUZE/BANDAGES/DRESSINGS)
BNDG GAUZE DERMACEA FLUFF 4 (GAUZE/BANDAGES/DRESSINGS) IMPLANT
BNDG GZE DERMACEA 4 6PLY (GAUZE/BANDAGES/DRESSINGS)
BNDG STRETCH 4X75 STRL LF (GAUZE/BANDAGES/DRESSINGS) IMPLANT
BUR ACORN 9.0 PRECISION (BURR) ×2 IMPLANT
BUR RND DIAMOND ELITE 4.0 (BURR) IMPLANT
BUR ROUND PRECISION 4.0 (BURR) IMPLANT
BUR SPIRAL ROUTER 2.3 (BUR) ×2 IMPLANT
CANISTER SUCT 3000ML PPV (MISCELLANEOUS) ×4 IMPLANT
CASSETTE SUCT IRRIG SONOPET IQ (MISCELLANEOUS) IMPLANT
CATH VENTRIC 35X38 W/TROCAR LG (CATHETERS) IMPLANT
CLIP VESOCCLUDE MED 6/CT (CLIP) IMPLANT
CNTNR URN SCR LID CUP LEK RST (MISCELLANEOUS) ×2 IMPLANT
CONT SPEC 4OZ STRL OR WHT (MISCELLANEOUS) ×2
COVER BURR HOLE 7 (Orthopedic Implant) IMPLANT
COVER MAYO STAND STRL (DRAPES) IMPLANT
COVERAGE SUPPORT O-ARM STEALTH (MISCELLANEOUS) ×2 IMPLANT
DERMABOND ADVANCED .7 DNX12 (GAUZE/BANDAGES/DRESSINGS) IMPLANT
DRAIN SUBARACHNOID (WOUND CARE) IMPLANT
DRAPE HALF SHEET 40X57 (DRAPES) ×2 IMPLANT
DRAPE MICROSCOPE SLANT 54X150 (MISCELLANEOUS) IMPLANT
DRAPE NEUROLOGICAL W/INCISE (DRAPES) ×2 IMPLANT
DRAPE STERI IOBAN 125X83 (DRAPES) IMPLANT
DRAPE SURG 17X23 STRL (DRAPES) IMPLANT
DRAPE WARM FLUID 44X44 (DRAPES) ×2 IMPLANT
DRILL 2.5MM SMALL QC EVOS (BIT) ×2
DRSG ADAPTIC 3X8 NADH LF (GAUZE/BANDAGES/DRESSINGS) IMPLANT
DRSG TELFA 3X8 NADH STRL (GAUZE/BANDAGES/DRESSINGS) IMPLANT
DURAPREP 6ML APPLICATOR 50/CS (WOUND CARE) ×2 IMPLANT
ELECT REM PT RETURN 9FT ADLT (ELECTROSURGICAL) ×2
ELECTRODE REM PT RTRN 9FT ADLT (ELECTROSURGICAL) ×2 IMPLANT
EVACUATOR 1/8 PVC DRAIN (DRAIN) IMPLANT
EVACUATOR SILICONE 100CC (DRAIN) IMPLANT
FEE COVERAGE SUPPORT O-ARM (MISCELLANEOUS) ×2 IMPLANT
FORCEPS BIPOLAR SPETZLER 8 1.0 (NEUROSURGERY SUPPLIES) ×2 IMPLANT
GAUZE 4X4 16PLY ~~LOC~~+RFID DBL (SPONGE) IMPLANT
GAUZE SPONGE 4X4 12PLY STRL (GAUZE/BANDAGES/DRESSINGS) IMPLANT
GLOVE BIO SURGEON STRL SZ7 (GLOVE) IMPLANT
GLOVE BIOGEL PI IND STRL 7.0 (GLOVE) IMPLANT
GLOVE BIOGEL PI IND STRL 7.5 (GLOVE) ×2 IMPLANT
GLOVE ECLIPSE 7.5 STRL STRAW (GLOVE) ×2 IMPLANT
GLOVE EXAM NITRILE LRG STRL (GLOVE) IMPLANT
GLOVE EXAM NITRILE XS STR PU (GLOVE) IMPLANT
GOWN STRL REUS W/ TWL LRG LVL3 (GOWN DISPOSABLE) ×4 IMPLANT
GOWN STRL REUS W/ TWL XL LVL3 (GOWN DISPOSABLE) IMPLANT
GOWN STRL REUS W/TWL 2XL LVL3 (GOWN DISPOSABLE) IMPLANT
GOWN STRL REUS W/TWL LRG LVL3 (GOWN DISPOSABLE) ×4
GOWN STRL REUS W/TWL XL LVL3 (GOWN DISPOSABLE)
HEMOSTAT POWDER KIT SURGIFOAM (HEMOSTASIS) ×2 IMPLANT
HEMOSTAT SURGICEL 2X14 (HEMOSTASIS) ×2 IMPLANT
IV NS 1000ML (IV SOLUTION) ×2
IV NS 1000ML BAXH (IV SOLUTION) ×2 IMPLANT
KIT BASIN OR (CUSTOM PROCEDURE TRAY) ×2 IMPLANT
KIT DRAIN CSF ACCUDRAIN (MISCELLANEOUS) IMPLANT
KIT TURNOVER KIT B (KITS) ×2 IMPLANT
MARKER SPHERE PSV REFLC NDI (MISCELLANEOUS) ×6 IMPLANT
NDL SPNL 18GX3.5 QUINCKE PK (NEEDLE) IMPLANT
NEEDLE HYPO 22GX1.5 SAFETY (NEEDLE) ×2 IMPLANT
NEEDLE SPNL 18GX3.5 QUINCKE PK (NEEDLE) IMPLANT
NS IRRIG 1000ML POUR BTL (IV SOLUTION) ×6 IMPLANT
PACK BATTERY CMF DISP FOR DVR (ORTHOPEDIC DISPOSABLE SUPPLIES) IMPLANT
PACK CRANIOTOMY CUSTOM (CUSTOM PROCEDURE TRAY) ×2 IMPLANT
PATTIES SURGICAL .25X.25 (GAUZE/BANDAGES/DRESSINGS) IMPLANT
PATTIES SURGICAL .5 X.5 (GAUZE/BANDAGES/DRESSINGS) IMPLANT
PATTIES SURGICAL .5 X3 (DISPOSABLE) IMPLANT
PATTIES SURGICAL 1/4 X 3 (GAUZE/BANDAGES/DRESSINGS) IMPLANT
PATTIES SURGICAL 1X1 (DISPOSABLE) IMPLANT
PIN MAYFIELD SKULL DISP (PIN) ×2 IMPLANT
PLATE DOUBLE Y CMF 6H (Plate) IMPLANT
SCREW UNIII AXS SD 1.5X4 (Screw) IMPLANT
SPECIMEN JAR SMALL (MISCELLANEOUS) IMPLANT
SPIKE FLUID TRANSFER (MISCELLANEOUS) ×2 IMPLANT
SPONGE NEURO XRAY DETECT 1X3 (DISPOSABLE) IMPLANT
SPONGE SURGIFOAM ABS GEL 100 (HEMOSTASIS) ×2 IMPLANT
STAPLER VISISTAT 35W (STAPLE) ×2 IMPLANT
SUT ETHILON 3 0 FSL (SUTURE) IMPLANT
SUT ETHILON 3 0 PS 1 (SUTURE) IMPLANT
SUT MNCRL AB 3-0 PS2 18 (SUTURE) IMPLANT
SUT MON AB 3-0 SH 27 (SUTURE)
SUT MON AB 3-0 SH27 (SUTURE) IMPLANT
SUT NURALON 4 0 TR CR/8 (SUTURE) ×6 IMPLANT
SUT SILK 0 TIES 10X30 (SUTURE) IMPLANT
SUT VIC AB 2-0 CP2 18 (SUTURE) ×2 IMPLANT
TIP TISSUE SONOPET IQ STD 12 (TIP) IMPLANT
TOWEL GREEN STERILE (TOWEL DISPOSABLE) ×2 IMPLANT
TOWEL GREEN STERILE FF (TOWEL DISPOSABLE) ×2 IMPLANT
TRAY FOLEY MTR SLVR 16FR STAT (SET/KITS/TRAYS/PACK) ×2 IMPLANT
TUBE CONNECTING 12X1/4 (SUCTIONS) ×2 IMPLANT
TUBING FEATHERFLOW (TUBING) IMPLANT
UNDERPAD 30X36 HEAVY ABSORB (UNDERPADS AND DIAPERS) ×2 IMPLANT
WATER STERILE IRR 1000ML POUR (IV SOLUTION) ×2 IMPLANT

## 2023-02-08 NOTE — Op Note (Signed)
PATIENT: Evelyn [name]  DAY OF SURGERY: 02/08/23   PRE-OPERATIVE DIAGNOSIS:  Left frontal skull base tumor   POST-OPERATIVE DIAGNOSIS:  Left medial clinoid meningioma   PROCEDURE:  Left craniotomy for tumor resection, use of frameless stereotaxy and operating microscope   SURGEON:  Surgeon(s) and Role:    Judith Part, MD - Primary    Norm Parcel PA - Assisting   ANESTHESIA: ETGA   BRIEF HISTORY: This is a 52 year old woman who presented with progressive left sided vision loss and seizures. Workup showed a large left frontal enhancing mass, most consistent with a left clinoidal meningioma. This was discussed with the patient as well as risks, benefits, and alternatives and wished to proceed with surgery.   OPERATIVE DETAIL: The patient was taken to the operating room and placed on the OR table in the supine position. A formal time out was performed with two patient identifiers and confirmed the operative site. Anesthesia was induced by the anesthesia team. The [place] head holder was applied to the head and a registration array was attached to the [place]. This was co-registered with the patient's preoperative imaging, the fit appeared to be acceptable. Using frameless stereotaxy, the operative trajectory was planned and the incision was marked. Hair was clipped with surgical clippers over the incision and the area was then prepped and draped in a sterile fashion.  A linear incision was placed in the left pterional region in the standard fashion. Soft tissues were dissected and a pterional craniotomy was performed. The microscope was draped and brought into the field. Landmarks were confirmed with stereotactic navigation. Dura was opened, tumor was identified, devascularized, debulked, and resected. It was adherent with a difficult plane on the frontal lobe. It was dissected free from the vasculature but was quite adherent to the optic nerve. I therefore freed the remainder  of the nerve circumferentially to prevent tethering while I returned to the superior portion and carefully dissected that last portion free. The tumor was grossly resected without clear residual, the dura was coagulated along the skull base, hemostasis was confirmed, and the tumor was sent to pathology. The dura was closed with suture and bone flap was replaced with titanium plates and screws.   All instrument and sponge counts were correct, the incision was then closed in layers. The patient was then returned to anesthesia for emergence. No apparent complications at the completion of the procedure.   EBL:  1171m   DRAINS: none   SPECIMENS: left frontal brain tumor   TJudith Part MD 02/08/23 7:57 AM

## 2023-02-08 NOTE — Anesthesia Preprocedure Evaluation (Signed)
Anesthesia Evaluation  Patient identified by MRN, date of birth, ID band Patient awake    Reviewed: Allergy & Precautions, NPO status , Patient's Chart, lab work & pertinent test results  History of Anesthesia Complications Negative for: history of anesthetic complications  Airway Mallampati: III  TM Distance: >3 FB Neck ROM: Full    Dental  (+) Dental Advisory Given   Pulmonary neg shortness of breath, sleep apnea and Continuous Positive Airway Pressure Ventilation , neg COPD, neg recent URI   breath sounds clear to auscultation       Cardiovascular hypertension, (-) angina (-) Past MI and (-) CHF  Rhythm:Regular     Neuro/Psych  Headaches, Seizures -,  PSYCHIATRIC DISORDERS Anxiety Depression    Brain tumor    GI/Hepatic Neg liver ROS,GERD  Controlled,,  Endo/Other    Morbid obesity  Renal/GU negative Renal ROS     Musculoskeletal  (+) Arthritis ,    Abdominal   Peds  Hematology  (+) Blood dyscrasia, anemia Lab Results      Component                Value               Date                      WBC                      7.1                 01/31/2023                HGB                      10.7 (L)            01/31/2023                HCT                      33.0 (L)            01/31/2023                MCV                      82.3                01/31/2023                PLT                      288                 01/31/2023              Anesthesia Other Findings   Reproductive/Obstetrics                              Anesthesia Physical Anesthesia Plan  ASA: 3  Anesthesia Plan: General   Post-op Pain Management: Ofirmev IV (intra-op)*   Induction: Intravenous  PONV Risk Score and Plan: 3 and Ondansetron, Dexamethasone, Propofol infusion and TIVA  Airway Management Planned: Oral ETT  Additional Equipment: Arterial line  Intra-op Plan:   Post-operative Plan: Extubation  in OR and Possible Post-op intubation/ventilation  Informed Consent: I have  reviewed the patients History and Physical, chart, labs and discussed the procedure including the risks, benefits and alternatives for the proposed anesthesia with the patient or authorized representative who has indicated his/her understanding and acceptance.     Dental advisory given  Plan Discussed with: CRNA  Anesthesia Plan Comments: (Remi gtt)         Anesthesia Quick Evaluation

## 2023-02-08 NOTE — Transfer of Care (Signed)
Immediate Anesthesia Transfer of Care Note  Patient: Evelyn [name]  Procedure(s) Performed: Left craniotomy for tumor resection with stealth navigation (Left) APPLICATION OF CRANIAL NAVIGATION  Patient Location: PACU  Anesthesia Type:General  Level of Consciousness: awake and alert   Airway & Oxygen Therapy: Patient Spontanous Breathing and Patient connected to face mask oxygen  Post-op Assessment: Report given to RN and Post -op Vital signs reviewed and stable  Post vital signs: Reviewed and stable  Last Vitals:  Vitals Value Taken Time  BP 124/59 02/08/23 1412  Temp 98   Pulse 98 02/08/23 1417  Resp 17 02/08/23 1417  SpO2 99 % 02/08/23 1417  Vitals shown include unvalidated device data.  Last Pain:  Vitals:   02/08/23 0609  TempSrc:   PainSc: 7       Patients Stated Pain Goal: 5 ([id number])  Complications: No notable events documented.

## 2023-02-08 NOTE — Anesthesia Procedure Notes (Signed)
Procedure Name: Intubation Date/Time: 02/08/2023 8:05 AM  Performed by: Minerva Ends, CRNAPre-anesthesia Checklist: Patient identified, Emergency Drugs available, Suction available and Patient being monitored Patient Re-evaluated:Patient Re-evaluated prior to induction Oxygen Delivery Method: Circle system utilized Preoxygenation: Pre-oxygenation with 100% oxygen Induction Type: IV induction Ventilation: Mask ventilation without difficulty Laryngoscope Size: Mac and 3 Grade View: Grade I Tube type: Oral Tube size: 7.0 mm Number of attempts: 1 Airway Equipment and Method: Stylet and Oral airway Placement Confirmation: ETT inserted through vocal cords under direct vision, positive ETCO2 and breath sounds checked- equal and bilateral Secured at: 23 cm Tube secured with: Tape Dental Injury: Teeth and Oropharynx as per pre-operative assessment

## 2023-02-08 NOTE — Anesthesia Procedure Notes (Addendum)
Arterial Line Insertion Start/End2/15/2024 7:00 AM, 02/08/2023 7:10 AM Performed by: Oleta Mouse, MD, Lucia Mccreadie, Latricia Heft, CRNA, CRNA  Patient location: Pre-op. Preanesthetic checklist: patient identified, IV checked, site marked, risks and benefits discussed, surgical consent, monitors and equipment checked, pre-op evaluation, timeout performed and anesthesia consent Lidocaine 1% used for infiltration Left, radial was placed Catheter size: 20 G Hand hygiene performed  and maximum sterile barriers used   Attempts: 2 Procedure performed using ultrasound guided technique. Ultrasound Notes:anatomy identified, needle tip was noted to be adjacent to the nerve/plexus identified and no ultrasound evidence of intravascular and/or intraneural injection Following insertion, dressing applied and Biopatch. Post procedure assessment: normal and unchanged  Patient tolerated the procedure well with no immediate complications.

## 2023-02-08 NOTE — H&P (Signed)
Surgical H&P Update  HPI: 52 y.o. with a history of new onset seizures with left sided visual loss, workup showed a large left sided likely meningioma. No changes in health since they were last seen.   PMHx:  Past Medical History:  Diagnosis Date   Anemia    Anxiety    Depression    GERD (gastroesophageal reflux disease)    Headache    Obesity    Seizures ([place])    Sleep apnea    FamHx:  Family History  Problem Relation Age of Onset   Breast cancer Mother 44   Breast cancer Paternal Aunt    Breast cancer Paternal Grandmother    SocHx:  reports that she has never smoked. She has never used smokeless tobacco. She reports that she does not drink alcohol and does not use drugs.  Physical Exam: Aox3, PERRL, EOMI, speech fluent with normal content, some L sided dec'd visual acuity  Assesment/Plan: 52 y.o. woman with seizures, large left sided likely clinoidal meningioma, here for left craniotomy and tumor resection. Risks, benefits, and alternatives discussed and the patient would like to continue with surgery.  -OR today -4N ICU post-op  Judith Part, MD 02/08/23 7:37 AM

## 2023-02-09 ENCOUNTER — Encounter (HOSPITAL_COMMUNITY): Payer: Self-pay | Admitting: Neurological Surgery

## 2023-02-09 LAB — CBC
HCT: 29.3 % — ABNORMAL LOW (ref 36.0–46.0)
Hemoglobin: 9.4 g/dL — ABNORMAL LOW (ref 12.0–15.0)
MCH: 26.6 pg (ref 26.0–34.0)
MCHC: 32.1 g/dL (ref 30.0–36.0)
MCV: 83 fL (ref 80.0–100.0)
Platelets: 257 10*3/uL (ref 150–400)
RBC: 3.53 MIL/uL — ABNORMAL LOW (ref 3.87–5.11)
RDW: 14.5 % (ref 11.5–15.5)
WBC: 12.8 10*3/uL — ABNORMAL HIGH (ref 4.0–10.5)
nRBC: 0 % (ref 0.0–0.2)

## 2023-02-09 LAB — SURGICAL PATHOLOGY

## 2023-02-09 MED ORDER — FAMOTIDINE 20 MG PO TABS
20.0000 mg | ORAL_TABLET | Freq: Two times a day (BID) | ORAL | Status: DC
  Administered 2023-02-09 – 2023-02-15 (×13): 20 mg via ORAL
  Filled 2023-02-09 (×13): qty 1

## 2023-02-09 NOTE — Evaluation (Signed)
Physical Therapy Evaluation Patient Details Name: Evelyn [name] MRN: [id number] DOB: 1971/10/23 Today's Date: 02/09/2023  History of Present Illness  Pt is a 52 y.o. 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 Pt admitted for intervention and is s/p left craniotomy for tumor resection 2/16. PMH: GERD, anxiety, depression, seizures, sleep apnea, obesity  Clinical Impression  Patient presents with decreased mobility due to deficits listed in PT problem list.  She has L eye swollen shut and significant headache.  Previously very active in her church and taking care of her 33 y/o daughter at home.  Plans for home with her uncle & aunt and her mother weill help also.  PT will continue to follow.  Feel she should progress to go home with family support and follow up HHPT.        Recommendations for follow up therapy are one component of a multi-disciplinary discharge planning process, led by the attending physician.  Recommendations may be updated based on patient status, additional functional criteria and insurance authorization.  Follow Up Recommendations Home health PT      Assistance Recommended at Discharge Frequent or constant Supervision/Assistance  Patient can return home with the following  A little help with walking and/or transfers;Assist for transportation;Help with stairs or ramp for entrance;A little help with bathing/dressing/bathroom    Equipment Recommendations Rolling walker (2 wheels)  Recommendations for Other Services       Functional Status Assessment Patient has had a recent decline in their functional status and demonstrates the ability to make significant improvements in function in a reasonable and predictable amount of time.     Precautions / Restrictions Precautions Precautions: Fall Precaution Comments: L eye swollen  shut with vision loss prior to surgery      Mobility  Bed Mobility Overal bed mobility: Needs Assistance Bed Mobility: Supine to Sit     Supine to sit: HOB elevated, Min assist     General bed mobility comments: reaching for hand hold to pull up to sit    Transfers Overall transfer level: Needs assistance Equipment used: 2 person hand held assist Transfers: Sit to/from Stand Sit to Stand: Min assist, +2 physical assistance           General transfer comment: assist up from EOB cues for eye opening, anterior weight shift, +2 for balance and due to c/o severe headache    Ambulation/Gait Ambulation/Gait assistance: Min assist, +2 safety/equipment Gait Distance (Feet): 10 Feet (5' fwd then back to recliner) Assistive device: 2 person hand held assist Gait Pattern/deviations: Step-to pattern, Decreased stride length, Wide base of support       General Gait Details: mild imbalance and some increased BOS noted, limited due to headache and pt wanting to sleep after pain meds, cues for direction, safety and eye opening  Stairs            Wheelchair Mobility    Modified Rankin (Stroke Patients Only)       Balance Overall balance assessment: Needs assistance Sitting-balance support: Feet supported Sitting balance-Leahy Scale: Good Sitting balance - Comments: no LOB on EOB even with eyes closed   Standing balance support: Bilateral upper extremity supported Standing balance-Leahy Scale: Fair Standing balance comment: UE support for balance today with pt with limited focus and keeping eyes shut  Pertinent Vitals/Pain Pain Assessment Pain Assessment: Faces Faces Pain Scale: Hurts whole lot Pain Location: headaceh Pain Descriptors / Indicators: Headache Pain Intervention(s): Monitored during session, RN gave pain meds during session, Limited activity within patient's tolerance    Home Living Family/patient expects to be  discharged to:: Private residence Living Arrangements: Children Available Help at Discharge: Family Type of Home: House Home Access: Ramped entrance       Home Layout: One level Home Equipment: Shower seat;Grab bars - tub/shower;Hand held shower head Additional Comments: plans to stay with uncle and aunt and mother at discharge    Prior Function Prior Level of Function : Independent/Modified Independent             Mobility Comments: Did a lot of church work including music; 64 y/o daughter, normally lives with daughter only       Hand Dominance   Dominant Hand: Right    Extremity/Trunk Assessment   Upper Extremity Assessment Upper Extremity Assessment: Defer to OT evaluation    Lower Extremity Assessment Lower Extremity Assessment: RLE deficits/detail RLE Deficits / Details: AROM WFL, some tremor holding leg up antirgravity so strength at least 3/5 RLE Sensation: WNL       Communication   Communication: No difficulties  Cognition Arousal/Alertness: Awake/alert Behavior During Therapy: WFL for tasks assessed/performed Overall Cognitive Status: Impaired/Different from baseline                                 General Comments: a little pain focused, but cooperative, did not formally test orientation        General Comments General comments (skin integrity, edema, etc.): edema noted L side of head/face with eye swollen shut; mother in the room and son arrived during session; family supportive and plans home with then at d/c.  VSS throughout    Exercises     Assessment/Plan    PT Assessment Patient needs continued PT services  PT Problem List Decreased balance;Decreased strength;Decreased knowledge of precautions;Pain;Decreased mobility;Decreased knowledge of use of DME;Impaired sensation;Decreased activity tolerance       PT Treatment Interventions DME instruction;Functional mobility training;Balance training;Patient/family  education;Therapeutic activities;Gait training;Therapeutic exercise;Cognitive remediation    PT Goals (Current goals can be found in the Care Plan section)  Acute Rehab PT Goals Patient Stated Goal: to return to independent PT Goal Formulation: With patient/family Time For Goal Achievement: 02/23/23 Potential to Achieve Goals: Good    Frequency Min 3X/week     Co-evaluation PT/OT/SLP Co-Evaluation/Treatment: Yes Reason for Co-Treatment: To address functional/ADL transfers;Other (comment) (due to pain) PT goals addressed during session: Balance;Mobility/safety with mobility         AM-PAC PT "6 Clicks" Mobility  Outcome Measure Help needed turning from your back to your side while in a flat bed without using bedrails?: A Little Help needed moving from lying on your back to sitting on the side of a flat bed without using bedrails?: A Little Help needed moving to and from a bed to a chair (including a wheelchair)?: A Little Help needed standing up from a chair using your arms (e.g., wheelchair or bedside chair)?: A Little Help needed to walk in hospital room?: Total Help needed climbing 3-5 steps with a railing? : Total 6 Click Score: 14    End of Session Equipment Utilized During Treatment: Gait belt Activity Tolerance: Patient limited by fatigue;Patient limited by pain Patient left: in chair;with chair alarm set;with call bell/phone within reach;with  family/visitor present Nurse Communication: Mobility status PT Visit Diagnosis: Other abnormalities of gait and mobility (R26.89);Muscle weakness (generalized) (M62.81);Pain Pain - part of body:  (head)    Time: [id number] PT Time Calculation (min) (ACUTE ONLY): 31 min   Charges:   PT Evaluation $PT Eval Moderate Complexity: 1 Mod          Cyndi Kyiah Canepa, PT Acute Rehabilitation Services Office:[phone] 02/09/2023   Reginia Naas 02/09/2023, 1:29 PM

## 2023-02-09 NOTE — Evaluation (Signed)
Occupational Therapy Evaluation Patient Details Name: Evelyn [name] MRN: [id number] DOB: 1971/04/09 Today's Date: 02/09/2023   History of Present Illness Pt is a 52 y.o. 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 Pt admitted for intervention and is s/p left craniotomy for tumor resection 2/16. PMH: GERD, anxiety, depression, seizures, sleep apnea, obesity   Clinical Impression   Pt currently with functional limitations due to the deficits listed below (see OT Problem List). Prior to admit, pt was extremely active with her church and takes care of her 38 y/o daughter. Due to pain (headache) and swelling of the left eye, unable to fully assess cognition and vision and would recommend assessment at next OT treatment session. Plans to have family assist when returning home. Will plan for HHOT at discharge at this time.  Pt will benefit from skilled OT to increase their safety and independence with ADL and functional mobility for ADL to facilitate discharge to venue listed below.        Recommendations for follow up therapy are one component of a multi-disciplinary discharge planning process, led by the attending physician.  Recommendations may be updated based on patient status, additional functional criteria and insurance authorization.   Follow Up Recommendations  Home health OT     Assistance Recommended at Discharge Frequent or constant Supervision/Assistance  Patient can return home with the following A little help with walking and/or transfers;A lot of help with bathing/dressing/bathroom;Assistance with cooking/housework;Assist for transportation    Functional Status Assessment  Patient has had a recent decline in their functional status and demonstrates the ability to make significant improvements in function in a reasonable and predictable  amount of time.  Equipment Recommendations  Other (comment) (TBD)       Precautions / Restrictions Precautions Precautions: Fall Precaution Comments: L eye swollen shut with vision loss prior to surgery Restrictions Weight Bearing Restrictions: No      Mobility Bed Mobility Overal bed mobility: Needs Assistance Bed Mobility: Supine to Sit     Supine to sit: HOB elevated, Min assist     General bed mobility comments: reaching for hand hold to pull up to sit Patient Response: Flat affect  Transfers Overall transfer level: Needs assistance Equipment used: 2 person hand held assist Transfers: Sit to/from Stand Sit to Stand: Min assist, +2 physical assistance           General transfer comment: assist up from EOB cues for eye opening, anterior weight shift, +2 for balance and due to c/o severe headache      Balance Overall balance assessment: Needs assistance Sitting-balance support: Feet supported Sitting balance-Leahy Scale: Good Sitting balance - Comments: no LOB on EOB even with eyes closed   Standing balance support: Bilateral upper extremity supported Standing balance-Leahy Scale: Fair Standing balance comment: UE support for balance today with pt with limited focus and keeping eyes shut       ADL either performed or assessed with clinical judgement   ADL Overall ADL's : Needs assistance/impaired     General ADL Comments: Based on clinical judgement pt will require at least mod-max assist to complete BADL tasks.     Vision Baseline Vision/History: 1 Wears glasses (all the time.) Patient Visual Report: Blurring of vision Vision Assessment?: Vision impaired- to be further tested in functional context Additional Comments: Due to headache, unable to complete full vision assessment. Able to  reach sign poster on bathroom door and next to bathroom.            Pertinent Vitals/Pain Pain Assessment Pain Assessment: Faces Faces Pain Scale: Hurts whole  lot Pain Location: headache Pain Descriptors / Indicators: Headache Pain Intervention(s): Monitored during session, RN gave pain meds during session, Limited activity within patient's tolerance     Hand Dominance Right   Extremity/Trunk Assessment Upper Extremity Assessment Upper Extremity Assessment: RUE deficits/detail;LUE deficits/detail RUE Deficits / Details: Unable to demonstrate functional ROM which may have been related to fatigue. Able to demonstrate A/ROM shoulder flexion to 90 degrees although unable to sustain hold. MMT: shoulder flexion: 3/5, abduction: 3+/5, IR/er: 4/5, impaired gross grasp. RUE Sensation: decreased light touch (Reports numbness/tingling in right UE.) RUE Coordination:  (WFL) LUE Deficits / Details: Functional A/ROM in all ranges. Strength: 5/5 shoulder, elbow in all ranges. Intact gross grasp.   Lower Extremity Assessment Lower Extremity Assessment: Defer to PT evaluation RLE Deficits / Details: AROM WFL, some tremor holding leg up antirgravity so strength at least 3/5 RLE Sensation: WNL       Communication Communication Communication: No difficulties   Cognition Arousal/Alertness: Lethargic (sleepy) Behavior During Therapy: Flat affect, Agitated Overall Cognitive Status: Impaired/Different from baseline   General Comments: Focused on pain and cooperative to a certain degree. Did not test orientation formally.     General Comments  Edema noted left side of head/face with eye swollen shut; mother in room and son arrived during session; family supportive and plans to return home with them at discharge. VSS            Home Living Family/patient expects to be discharged to:: Private residence Living Arrangements: Children Available Help at Discharge: Family Type of Home: House Home Access: Ramped entrance     Bond: One level     Bathroom Shower/Tub: Occupational psychologist: Handicapped height     [place]: Shower  seat;Grab bars - tub/shower;Hand held shower head   Additional Comments: plans to stay with uncle and aunt and mother at discharge      Prior Functioning/Environment Prior Level of Function : Independent/Modified Independent   Mobility Comments: Did a lot of church work including music; 22 y/o daughter, normally lives with daughter only          OT Problem List: Decreased strength;Pain;Decreased range of motion;Impaired sensation;Decreased activity tolerance;Decreased knowledge of use of DME or AE;Impaired balance (sitting and/or standing);Impaired vision/perception;Impaired UE functional use      OT Treatment/Interventions: Self-care/ADL training;Splinting;Therapeutic exercise;Therapeutic activities;Cognitive remediation/compensation;Neuromuscular education;Energy conservation;Visual/perceptual remediation/compensation;Patient/family education;DME and/or AE instruction;Manual therapy;Balance training;Modalities    OT Goals(Current goals can be found in the care plan section) Acute Rehab OT Goals Patient Stated Goal: to sleep OT Goal Formulation: Patient unable to participate in goal setting Time For Goal Achievement: 02/23/23 Potential to Achieve Goals: Good  OT Frequency: Min 2X/week    Co-evaluation PT/OT/SLP Co-Evaluation/Treatment: Yes Reason for Co-Treatment: To address functional/ADL transfers;Other (comment) (due to pain) PT goals addressed during session: Balance;Mobility/safety with mobility OT goals addressed during session: Strengthening/ROM      AM-PAC OT "6 Clicks" Daily Activity     Outcome Measure Help from another person eating meals?: A Lot Help from another person taking care of personal grooming?: A Lot Help from another person toileting, which includes using toliet, bedpan, or urinal?: A Lot Help from another person bathing (including washing, rinsing, drying)?: A Lot Help from another person to put on and taking off regular  upper body clothing?: A  Lot Help from another person to put on and taking off regular lower body clothing?: A Lot 6 Click Score: 12   End of Session Equipment Utilized During Treatment: Gait belt Nurse Communication: Mobility status  Activity Tolerance: Patient limited by pain;Patient limited by fatigue Patient left: in chair;with call bell/phone within reach;with chair alarm set;with family/visitor present  OT Visit Diagnosis: Muscle weakness (generalized) (M62.81);Unsteadiness on feet (R26.81)                Time: [id number] OT Time Calculation (min): 21 min Charges:  OT General Charges $OT Visit: 1 Visit OT Evaluation $OT Eval Moderate Complexity: 1 Mod  [employer], OTR/L,CBIS  Supplemental OT - [facility] and [facility] Secure Chat Preferred    Zev Blue, Clarene Duke 02/09/2023, 2:37 PM

## 2023-02-09 NOTE — Evaluation (Signed)
Clinical/Bedside Swallow Evaluation Patient Details  Name: Evelyn [name] MRN: [id number] Date of Birth: [birth year]  Today's Date: 02/09/2023 Time: SLP Start Time (ACUTE ONLY): 82 SLP Stop Time (ACUTE ONLY): 1045 SLP Time Calculation (min) (ACUTE ONLY): 25 min  Past Medical History:  Past Medical History:  Diagnosis Date   Anemia    Anxiety    Depression    GERD (gastroesophageal reflux disease)    Headache    Obesity    Seizures ([place])    Sleep apnea    Past Surgical History:  Past Surgical History:  Procedure Laterality Date   ABDOMINAL HYSTERECTOMY     APPLICATION OF CRANIAL NAVIGATION N/A 02/08/2023   Procedure: APPLICATION OF CRANIAL NAVIGATION;  Surgeon: Judith Part, MD;  Location: [place];  Service: Neurosurgery;  Laterality: N/A;   BREAST LUMPECTOMY Left 1997   CHOLECYSTECTOMY     CRANIOTOMY Left 02/08/2023   Procedure: Left craniotomy for tumor resection with stealth navigation;  Surgeon: Judith Part, MD;  Location: [place];  Service: Neurosurgery;  Laterality: Left;   GASTRIC BYPASS     THROAT SURGERY     removed scar tissue from throat d/t being intubated.    TUBAL LIGATION     HPI:  Pt is a 52 y.o. 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 Pt admitted for intervention and is s/p left craniotomy for tumor resection 2/16. PMH: GERD, anxiety, depression, seizures, sleep apnea, obesity    Assessment / Plan / Recommendation  Clinical Impression  Pt was seen for bedside swallow evaluation with her mother present. Pt initially reported that she "just can't get things down" and her mother advised that the pt had reported that solids and liquids were "sticking". Pt initially stated that this has been happening since she had a "knot" removed from her throat, but subsequently stated that she typically consumes solids and  liquids without difficulty. Oral mechanism exam was [facility] and her natural dentition was adequate. Pt initially presented with oral holding, lingual thrusting/lateralization, grimacing, and chest elevation during p.o. intake/swallowing. However, these improved with re-assurance and encouragement regarding her swallow mechanism and performance. Use of distraction with conversation also appeared effective in lessening/eliminating pt's symptoms. Pt ultimately stated that she was fearful of swallowing and she attributed the aforementioned behaviors to fear. Mastication and oral clearance were WFL, but with some intermittent sucking of regular textures, and no s/s of aspiration were noted. Various diet consistencies were discussed and pt ultimately agreed that she would like to remain on the regular texture diet to maximize her options and to facilitate her being able to select foods with which she is most comfortable. SLP will follow briefly to ensure tolerance. SLP Visit Diagnosis: Dysphagia, unspecified (R13.10)    Aspiration Risk  No limitations    Diet Recommendation Regular;Thin liquid   Liquid Administration via: Cup;Straw Medication Administration: Whole meds with liquid Supervision: Patient able to self feed Compensations: Slow rate;Small sips/bites Postural Changes: Seated upright at 90 degrees    Other  Recommendations Oral Care Recommendations: Oral care BID    Recommendations for follow up therapy are one component of a multi-disciplinary discharge planning process, led by the attending physician.  Recommendations may be updated based on patient status, additional functional criteria and insurance authorization.  Follow up Recommendations        Assistance Recommended at Discharge    Functional  Status Assessment Patient has had a recent decline in their functional status and demonstrates the ability to make significant improvements in function in a reasonable and predictable amount of  time.  Frequency and Duration min 1 x/week  1 week       Prognosis Prognosis for improved oropharyngeal function: Good      Swallow Study   General Date of Onset: 02/08/23 HPI: Pt is a 52 y.o. 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 Pt admitted for intervention and is s/p left craniotomy for tumor resection 2/16. PMH: GERD, anxiety, depression, seizures, sleep apnea, obesity Type of Study: Bedside Swallow Evaluation Previous Swallow Assessment: none Diet Prior to this Study: Regular;Thin liquids (Level 0) Temperature Spikes Noted: No Respiratory Status: Room air History of Recent Intubation: Yes Total duration of intubation (days):  (for surgery) Date extubated: 02/08/23 Behavior/Cognition: Alert;Cooperative;Pleasant mood Oral Cavity Assessment: Within Functional Limits Oral Care Completed by SLP: No Oral Cavity - Dentition: Adequate natural dentition Vision: Functional for self-feeding Self-Feeding Abilities: Able to feed self Patient Positioning: Upright in bed;Postural control adequate for testing Baseline Vocal Quality: Normal Volitional Swallow: Able to elicit    Oral/Motor/Sensory Function Overall Oral Motor/Sensory Function: Within functional limits   Ice Chips Ice chips: Impaired Presentation: Spoon Oral Phase Functional Implications: Oral holding   Thin Liquid Thin Liquid: Impaired Presentation: Cup;Straw;Spoon Oral Phase Functional Implications: Oral holding    Nectar Thick Nectar Thick Liquid: Not tested   Honey Thick Honey Thick Liquid: Not tested   Puree Puree: Impaired Presentation: Spoon Oral Phase Functional Implications: Oral holding   Solid     Solid: Impaired Oral Phase Functional Implications: Oral holding Pharyngeal Phase Impairments:  (effortful swallow)     Isaiah Torok I. Hardin Negus, [place], [place] Office number [phone]  Horton Marshall 02/09/2023,11:43 AM

## 2023-02-09 NOTE — Progress Notes (Addendum)
Neurosurgery Service Progress Note  Subjective: No acute events overnight, some headaches, otherwise no complaints except L eye swollen shut so unable to tell if her vision is subjectively improved   Objective: Vitals:   02/09/23 0730 02/09/23 0745 02/09/23 0800 02/09/23 0815  BP: (!) 141/77 (!) 155/86 125/73 (!) 140/72  Pulse: 63 73 73 68  Resp: 16 11 (!) 22 (!) 24  Temp:   99.7 F (37.6 C)   TempSrc:   Axillary   SpO2: 95% 93% 97% 96%  Weight:      Height:        Physical Exam: Somnolent but awakens to voice, OD swollen shut, OS full ROM and normal VA with normal pupillary diameter / reactivity, Ox3, FS & SS, incision c/d/I, strength 5/5x4, no drift, speech fluent with normal content  Assessment & Plan: 52 y.o. woman w/ seizures s/p L pterional for large meningioma, recovering well.  -cont home keppra -SLP passed swallow thin regular liquids -transfer to stepdown -final path pending -MRI with likely subtotal - some residual along the cribriform, can follow on serial imaging -activity as tolerated, PT/OT -dispo pending more activity / fatigue -SQH ordered for 2/17  Judith Part  02/09/23 12:33 PM

## 2023-02-10 NOTE — Progress Notes (Signed)
Physical Therapy Treatment Patient Details Name: Evelyn [name] MRN: [id number] DOB: 11-Jun-1971 Today's Date: 02/10/2023   History of Present Illness Pt is a 52 y.o. 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 Pt admitted for intervention and is s/p left craniotomy for tumor resection 2/16. PMH: GERD, anxiety, depression, seizures, sleep apnea, obesity    PT Comments    Pt greeted sidelying, sleeping but easily woken and agreeable to session with light encouragement. Pt mother and aunt present throughout session, supportive and encouraging. Pt continues to be limited by headache and vision deficits with L eye swollen shut and swelling migrating to R eye with eye barely open and pt reporting blurry vision in R, however pt with continued progress towards acute goals this session. Pt grossly min assist throughout mobility with HHA needed to steady during transfers and for short gait (~12') in room, with distance limited by headache. Pt continues to require increased verbal and tactile cues for safety and environmental awareness secondary to visual deficits. Agree with current plan and pt continues to benefit from skilled PT services to progress toward functional mobility goals.    Recommendations for follow up therapy are one component of a multi-disciplinary discharge planning process, led by the attending physician.  Recommendations may be updated based on patient status, additional functional criteria and insurance authorization.  Follow Up Recommendations  Home health PT     Assistance Recommended at Discharge Frequent or constant Supervision/Assistance  Patient can return home with the following A little help with walking and/or transfers;Assist for transportation;Help with stairs or ramp for entrance;A little help with bathing/dressing/bathroom    Equipment Recommendations  Rolling walker (2 wheels)    Recommendations for Other Services       Precautions / Restrictions Precautions Precautions: Fall Precaution Comments: L eye swollen shut with vision loss prior to surgery, blurry vision of R eye and almost swollen shut Restrictions Weight Bearing Restrictions: No     Mobility  Bed Mobility Overal bed mobility: Needs Assistance Bed Mobility: Supine to Sit, Sit to Supine     Supine to sit: HOB elevated, Min assist Sit to supine: Min guard, Min assist   General bed mobility comments: reaching for hand hold to pull up to sit, min assist to position in center of bed on return to supine    Transfers Overall transfer level: Needs assistance Equipment used: 1 person hand held assist Transfers: Sit to/from Stand Sit to Stand: Min assist           General transfer comment: min a to steady on rise from EOB    Ambulation/Gait Ambulation/Gait assistance: Min assist Gait Distance (Feet): 12 Feet Assistive device: 1 person hand held assist Gait Pattern/deviations: Step-to pattern, Decreased stride length, Wide base of support Gait velocity: decr     General Gait Details: min a with HHA around bed from one side to other, pt declining retun to other side 2/2 fatigue and headache. Pt reaching for foot of bed for stability and due to vision deficits, no LOB   Stairs             Wheelchair Mobility    Modified Rankin (Stroke Patients Only)       Balance Overall balance assessment: Needs assistance Sitting-balance support: Feet supported Sitting balance-Leahy Scale: Good Sitting balance - Comments: no LOB on EOB even with eyes closed   Standing  balance support: Bilateral upper extremity supported Standing balance-Leahy Scale: Fair Standing balance comment: single UE support during static tasks, pt reaching for BUE support during dynamic tasks                            Cognition  Arousal/Alertness: Lethargic, Awake/alert (sleepy) Behavior During Therapy: Flat affect, WFL for tasks assessed/performed Overall Cognitive Status: Impaired/Different from baseline Area of Impairment: Safety/judgement, Problem solving                         Safety/Judgement: Decreased awareness of safety, Decreased awareness of deficits   Problem Solving: Requires verbal cues, Requires tactile cues, Slow processing General Comments: A&O x4, some moments of impulsivity, pt interanlly distracted throughout session about pain and eye itching, cues needed to stay on task        Exercises      General Comments General comments (skin integrity, edema, etc.): Edema noted left side of head/face expanding to R with R eye swollen and L eye swollen shut mother and aunt in room family supportive. HR tachy up to 125bpm during activity      Pertinent Vitals/Pain Pain Assessment Pain Assessment: Faces Faces Pain Scale: Hurts little more Pain Location: headache Pain Descriptors / Indicators: Headache Pain Intervention(s): Monitored during session, Limited activity within patient's tolerance, Patient requesting pain meds-RN notified    Home Living                          Prior Function            PT Goals (current goals can now be found in the care plan section) Acute Rehab PT Goals PT Goal Formulation: With patient/family Time For Goal Achievement: 02/23/23 Progress towards PT goals: Progressing toward goals    Frequency    Min 3X/week      PT Plan      Co-evaluation              AM-PAC PT "6 Clicks" Mobility   Outcome Measure  Help needed turning from your back to your side while in a flat bed without using bedrails?: A Little Help needed moving from lying on your back to sitting on the side of a flat bed without using bedrails?: A Little Help needed moving to and from a bed to a chair (including a wheelchair)?: A Little Help needed standing up  from a chair using your arms (e.g., wheelchair or bedside chair)?: A Little Help needed to walk in hospital room?: A Lot (for navigation 2/2 vision deficits) Help needed climbing 3-5 steps with a railing? : Total 6 Click Score: 15    End of Session Equipment Utilized During Treatment: Gait belt Activity Tolerance: Patient limited by fatigue;Patient limited by pain Patient left: with call bell/phone within reach;with family/visitor present;in bed;with bed alarm set Nurse Communication: Mobility status;Patient requests pain meds PT Visit Diagnosis: Other abnormalities of gait and mobility (R26.89);Muscle weakness (generalized) (M62.81);Pain Pain - part of body:  (head)     Time: [id number] PT Time Calculation (min) (ACUTE ONLY): 26 min  Charges:  $Gait Training: 8-22 mins $Therapeutic Activity: 8-22 mins                     Nickey Kloepfer R. PTA Acute Rehabilitation Services Office: [place] 02/10/2023, 3:30 PM

## 2023-02-10 NOTE — Anesthesia Postprocedure Evaluation (Signed)
Anesthesia Post Note  Patient: Evelyn [name]  Procedure(s) Performed: Left craniotomy for tumor resection with stealth navigation (Left) [place]     Patient location during evaluation: PACU Anesthesia Type: General Level of consciousness: patient cooperative Pain management: pain level controlled Vital Signs Assessment: post-procedure vital signs reviewed and stable Respiratory status: spontaneous breathing, nonlabored ventilation, respiratory function stable and patient connected to nasal cannula oxygen Cardiovascular status: stable Postop Assessment: no apparent nausea or vomiting Anesthetic complications: no   No notable events documented.  Last Vitals:  Vitals:   02/10/23 0819 02/10/23 1300  BP: (!) 112/57 139/77  Pulse: (!) 59 76  Resp: 14 17  Temp: 37.4 C 36.9 C  SpO2: 94% 95%    Last Pain:  Vitals:   02/10/23 1300  TempSrc: Oral  PainSc:                  Adanely Reynoso

## 2023-02-10 NOTE — Progress Notes (Signed)
Neurosurgery Service Progress Note  Subjective: No acute events overnight, some nausea, no emesis, headaches improving, energy level improving  Objective: Vitals:   02/09/23 1520 02/09/23 1938 02/09/23 2338 02/10/23 0324  BP: (!) 148/70  138/75   Pulse: 64  87   Resp: 16  19   Temp: (!) 100.4 F (38 C) 99.2 F (37.3 C) 99.3 F (37.4 C) 99.2 F (37.3 C)  TempSrc: Axillary Oral Oral Oral  SpO2: 95%  92%   Weight:      Height:        Physical Exam: Awake/alert, OD swollen shut, OS full ROM and normal VA with normal pupillary diameter / reactivity, Ox3, FS & SS, incision c/d/I, strength 5/5x4, no drift, speech fluent with normal content  Assessment & Plan: 52 y.o. woman w/ seizures s/p L pterional for large meningioma, recovering well.  -cont home keppra -final path pending -MRI with some residual along the cribriform, can follow on serial imaging -activity as tolerated, PT/OT rec'd HHPTOT -SQH -possible discharge home tomorrow  Judith Part  02/10/23 8:04 AM

## 2023-02-11 NOTE — Progress Notes (Signed)
PT Cancellation Note  Patient Details Name: Evelyn [name] MRN: [id number] DOB: 1971/07/13   Cancelled Treatment:    Reason Eval/Treat Not Completed: Fatigue/lethargy limiting ability to participate  Patient sleeping soundly with family member present and did not want her awakened. States "she's been out" since she had pain medication. Will return today to work with pt.    [place]  Office [phone]  Rexanne Mano 02/11/2023, 9:37 AM

## 2023-02-11 NOTE — Progress Notes (Addendum)
Went in to do my assessment and the family is complaining about the redness of the CNA last night. Stated that they put the pt on a small BSC that pinched her skin and made a skin tear on the back of both of her legs. There was three sites on the left back leg and one spot on right leg where the skin had been pinched and broken from sitting on a BSC that was to small for the pt.  They also said that they left her on the [facility] to long and they kept calling for asst and no one came.  Finally per family the RN came in and they reported the attitude of CNA  and the small wounds received on the back of her legs due to the lace of attention for the pt. We put a bariatric BSC in the room for the pt to make sure she is comfortable when having to get up to use the bathroom. This was reported to the charge nurse and I will make sure to attend to the pt and the family regularly and frequently throughout the shift.  Pt received a whole bath and clothing changed and call light was answered swiftly throughout the shift.

## 2023-02-11 NOTE — Progress Notes (Signed)
Physical Therapy Treatment Patient Details Name: Evelyn [name] MRN: [id number] DOB: Mar 31, 1971 Today's Date: 02/11/2023   History of Present Illness Pt is a 52 y.o. 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 Pt admitted for intervention and is s/p left craniotomy for tumor resection 2/16. PMH: GERD, anxiety, depression, seizures, sleep apnea, obesity    PT Comments    Patient tolerating more activity despite continued reports of headache and generalized fatigue.  Able to progress ambulation to 70 ft with RW and min assist (assist due to no vision with both eyes now swollen shut). Anticipate continued good progress with plan for HHPT still appropriate.     Recommendations for follow up therapy are one component of a multi-disciplinary discharge planning process, led by the attending physician.  Recommendations may be updated based on patient status, additional functional criteria and insurance authorization.  Follow Up Recommendations  Home health PT     Assistance Recommended at Discharge Frequent or constant Supervision/Assistance  Patient can return home with the following A little help with walking and/or transfers;Assist for transportation;Help with stairs or ramp for entrance;A little help with bathing/dressing/bathroom   Equipment Recommendations  Rolling walker (2 wheels)    Recommendations for Other Services       Precautions / Restrictions Precautions Precautions: Fall Precaution Comments: both eyes swollen shut with vision loss prior to surgery, blurry vision of R eye and almost swollen shut Restrictions Weight Bearing Restrictions: No     Mobility  Bed Mobility Overal bed mobility: Needs Assistance Bed Mobility: Supine to Sit, Sit to Supine     Supine to sit: HOB elevated, Min assist Sit to supine: Supervision    General bed mobility comments: reaching for hand hold to pull up to sit,    Transfers Overall transfer level: Needs assistance Equipment used: Rolling walker (2 wheels) Transfers: Sit to/from Stand Sit to Stand: Min assist           General transfer comment: min assist to steady RW as pt needed to put both hands on RW due to eyes swollen shut    Ambulation/Gait Ambulation/Gait assistance: Min assist Gait Distance (Feet): 70 Feet Assistive device: Rolling walker (2 wheels) Gait Pattern/deviations: Decreased stride length, Wide base of support, Step-through pattern Gait velocity: decr     General Gait Details: min assist to steer RW with max cues for directions as her eyes are swollen shut   Stairs             Wheelchair Mobility    Modified Rankin (Stroke Patients Only)       Balance Overall balance assessment: Needs assistance Sitting-balance support: Feet supported Sitting balance-Leahy Scale: Good Sitting balance - Comments: no LOB on EOB even with eyes closed   Standing balance support: Bilateral upper extremity supported Standing balance-Leahy Scale: Fair                              Cognition Arousal/Alertness: Awake/alert (sleepy) Behavior During Therapy: Flat affect, WFL for tasks assessed/performed                                 Problem Solving: Requires verbal cues, Requires tactile cues General Comments: A&O x4, following instructions and no signs of impulsivity; requires verbal and tactile  cues due to both eyes now swollen shut        Exercises      General Comments General comments (skin integrity, edema, etc.): Mother present. Pt preferred to use her shoes (Crocs) instead of hospital socks      Pertinent Vitals/Pain Pain Assessment Pain Assessment: Faces Faces Pain Scale: Hurts little more Pain Location: headache Pain Descriptors / Indicators: Headache Pain Intervention(s): Limited activity within  patient's tolerance, Monitored during session, Premedicated before session    Home Living                          Prior Function            PT Goals (current goals can now be found in the care plan section) Acute Rehab PT Goals Patient Stated Goal: to return to independent Time For Goal Achievement: 02/23/23 Potential to Achieve Goals: Good Progress towards PT goals: Progressing toward goals    Frequency    Min 3X/week      PT Plan Current plan remains appropriate    Co-evaluation     PT goals addressed during session: Balance;Mobility/safety with mobility        AM-PAC PT "6 Clicks" Mobility   Outcome Measure  Help needed turning from your back to your side while in a flat bed without using bedrails?: A Little Help needed moving from lying on your back to sitting on the side of a flat bed without using bedrails?: A Little Help needed moving to and from a bed to a chair (including a wheelchair)?: A Little Help needed standing up from a chair using your arms (e.g., wheelchair or bedside chair)?: A Little Help needed to walk in hospital room?: A Little (for navigation 2/2 vision deficits) Help needed climbing 3-5 steps with a railing? : Total 6 Click Score: 16    End of Session Equipment Utilized During Treatment: Gait belt Activity Tolerance: Patient limited by fatigue;Patient limited by pain Patient left: with call bell/phone within reach;with family/visitor present;in bed;with bed alarm set   PT Visit Diagnosis: Other abnormalities of gait and mobility (R26.89);Muscle weakness (generalized) (M62.81);Pain Pain - part of body:  (head)     Time: [id number] PT Time Calculation (min) (ACUTE ONLY): 21 min  Charges:  $Gait Training: 8-22 mins                      [place]  Office [phone]    Rexanne Mano 02/11/2023, 11:35 AM

## 2023-02-11 NOTE — Progress Notes (Signed)
Patient ID: Evelyn [name], female   DOB: [birth year], 52 y.o.   MRN: [id number] BP 127/69 (BP Location: Right Arm)   Pulse 62   Temp 99.8 F (37.7 C) (Oral)   Resp 17   Ht 5' 7"$  (1.702 m)   Wt 133.8 kg   SpO2 97%   BMI 46.20 kg/m  Alert, fluent Moving all extremities Periorbital swelling as expected Not up to being discharged, so will hold.

## 2023-02-12 ENCOUNTER — Other Ambulatory Visit: Payer: Self-pay | Admitting: Radiation Therapy

## 2023-02-12 NOTE — TOC Progression Note (Signed)
Transition of Care [facility]) - Progression Note    Patient Details  Name: Bryony Rougier MRN: [id number] Date of Birth: May 14, 1971  [facility] CM/SW Contact  Loletha Grayer Beverely Pace, RN Phone Number: 02/12/2023, 5:25 PM  Clinical Narrative:    Case manager spoke with Patient and her mom, Franchot Gallo about discharge needs and recommendation for Home Health. Mrs.Kittrell asked that I also contact patient's son, Dede Query [phone] to discuss discharge and answer his questions. CM called [facility] and explained need for HHPT/OT to he and his wife. Case Manager called  [facility] and  was informed that there is no availability at this time, [facility] can not accept patient's coverage and [facility] has no availability. [facility] may be able to accept but it will be a delay in start of care. Case Manager updated patient's family and told them [facility] Team will continue to work on this and get back in touch with them. MD notified as well.   At discharge patient will be going to her uncle's home, the address is [address] Her cell # is [phone].                                   Expected Discharge Plan: [place]    Expected Discharge Plan and Servic es   Discharge Planning Services: CM Consult, Mobile Meals Post Acute Care Choice: Home Health                             HH Arranged: PT, OT [facility] Agency: [place] [facility]         Social Determinants of Health (SDOH) Interventions SDOH Screenings   Tobacco Use: Low Risk  (02/09/2023)    Readmission Risk Interventions     No data to display

## 2023-02-12 NOTE — Progress Notes (Signed)
Occupational Therapy Treatment Patient Details Name: Evelyn [name] MRN: [id number] DOB: 1971/04/20 Today's Date: 02/12/2023   History of present illness Pt is a 52 y.o. 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 Pt admitted for intervention and is s/p left craniotomy for tumor resection 2/16. PMH: GERD, anxiety, depression, seizures, sleep apnea, obesity   OT comments  Session with focus on ADL re-training and functional transfers utilizing RW. Pt did not require any push off when demonstrating sit to stand from seated EOB. Able to use bilateral UE's for all functional tasks without difficulty. Pt/family education provided on use of bariatric BSC to assist with toilet transfers.   Patient is confined to a single room and not able to walk the distance required to go the bathroom, or he/she is unable to safely negotiate stairs required to access the bathroom.  A 3in1 BSC will alleviate this problem.   Educated family on encouraging pt to sit upright more during the day either in bed or in the recliner to continue getting stronger. Provide VC during functional mobility to navigate safely in her environment. Pt continues to be appropriate for HHOT at discharge. OT will continue to follow patient acutely.     Recommendations for follow up therapy are one component of a multi-disciplinary discharge planning process, led by the attending physician.  Recommendations may be updated based on patient status, additional functional criteria and insurance authorization.    Follow Up Recommendations  Home health OT     Assistance Recommended at Discharge Frequent or constant Supervision/Assistance  Patient can return home with the following  A little help with walking and/or transfers;A lot of help with bathing/dressing/bathroom;Assistance with  cooking/housework;Assist for transportation   Equipment Recommendations  Other (comment) (Bariatric bedside commode)       Precautions / Restrictions Precautions Precautions: Fall Precaution Comments: both eyes swollen with vision loss prior to surgery, blurry vision of Left eye swollen shut with right eye now swollen. Prefers to keep her right eye closed although is able to open intermittently. Restrictions Weight Bearing Restrictions: No       Mobility Bed Mobility Overal bed mobility: Modified Independent Bed Mobility: Supine to Sit     Supine to sit: Modified independent (Device/Increase time), HOB elevated       Patient Response: Flat affect, Cooperative  Transfers Overall transfer level: Needs assistance Equipment used: Rolling walker (2 wheels) Transfers: Sit to/from Stand, Bed to chair/wheelchair/BSC Sit to Stand: Min guard     Step pivot transfers: Min guard       Balance Overall balance assessment: Needs assistance Sitting-balance support: Feet supported Sitting balance-Leahy Scale: Good Sitting balance - Comments: no LOB on EOB even with eyes closed   Standing balance support: During functional activity, No upper extremity supported Standing balance-Leahy Scale: Fair Standing balance comment: Pt stood at sink to complete teeth brushing.         ADL either performed or assessed with clinical judgement   ADL Overall ADL's : Needs assistance/impaired     Grooming: Wash/dry hands;Supervision/safety;Standing     Lower Body Dressing: Set up Lower Body Dressing Details (indicate cue type and reason): Sitting on EOB, pt was able to don [employer] Transfer: Min guard;BSC/3in1;Rolling walker (2 wheels) Toilet Transfer Details (indicate cue type and reason): BSC over toilet Toileting- Clothing Manipulation and Hygiene: Sitting/lateral lean;Supervision/safety  Cognition Arousal/Alertness: Awake/alert Behavior During Therapy: Flat  affect Overall Cognitive Status: Impaired/Different from baseline Area of Impairment: Safety/judgement       Safety/Judgement: Decreased awareness of safety, Decreased awareness of deficits   Problem Solving: Requires verbal cues, Slow processing                General Comments Mother present. Mother was reinforcing to PT that she has had 4 back surgeries and cannot lift pt. She is interested in getting an aide to help pt bathe. Educated her that from a PT perspective pt did not need any lifting assist. Deferred discussion of aide to discuss with OT and OT made aware.    Pertinent Vitals/ Pain       Pain Assessment Pain Assessment: Faces Faces Pain Scale: Hurts even more Pain Location: headache Pain Descriptors / Indicators: Headache Pain Intervention(s): Monitored during session         Frequency  Min 2X/week        Progress Toward Goals  OT Goals(current goals can now be found in the care plan section)  Progress towards OT goals: Progressing toward goals     Plan Discharge plan remains appropriate;Frequency remains appropriate       AM-PAC OT "6 Clicks" Daily Activity     Outcome Measure   Help from another person eating meals?: None Help from another person taking care of personal grooming?: A Little Help from another person toileting, which includes using toliet, bedpan, or urinal?: A Little Help from another person bathing (including washing, rinsing, drying)?: A Little Help from another person to put on and taking off regular upper body clothing?: A Little Help from another person to put on and taking off regular lower body clothing?: A Little 6 Click Score: 19    End of Session Equipment Utilized During Treatment: Gait belt;Rolling walker (2 wheels)  OT Visit Diagnosis: Muscle weakness (generalized) (M62.81);Unsteadiness on feet (R26.81)   Activity Tolerance Patient limited by fatigue   Patient Left with family/visitor present;Other (comment) (hand  off with PT at end of session)           Time: 1319-1340 OT Time Calculation (min): 21 min  Charges: OT General Charges $OT Visit: 1 Visit OT Treatments $Self Care/Home Management : 8-22 mins  Ailene Ravel, OTR/L,CBIS  Supplemental OT - [facility] and [facility] Secure Chat Preferred    Mallery Harshman, Clarene Duke 02/12/2023, 3:26 PM

## 2023-02-12 NOTE — Progress Notes (Signed)
Neurosurgery Service Progress Note  Subjective: No acute events overnight, she continues to have difficulty seeing out of the left eye d/t swelling and blurred vision. She states that the poor vision is causing difficulty with balance. Family is concerned that she is not ready to d/c home due to limited help with ADLs.   Objective: Vitals:   02/11/23 1933 02/11/23 2300 02/12/23 0320 02/12/23 0741  BP: (!) 121/58 (!) 113/58 133/74 131/79  Pulse:  63 61 61  Resp:  20 20 20  $ Temp: 97.7 F (36.5 C)  98.6 F (37 C) 99.7 F (37.6 C)  TempSrc: Axillary  Oral Oral  SpO2:  99% 94% 94%  Weight:      Height:        Physical Exam: Awake/alert, OD swollen shut, OS full ROM and normal VA with normal pupillary diameter / reactivity, Ox3, FS & SS, incision c/d/I, strength 5/5x4, no drift, speech fluent with normal content  Assessment & Plan: 52 y.o. woman w/ seizures s/p L pterional for large meningioma, recovering well.  -cont home keppra -final path grade 2 meningioma -cont PT/OT -consulted SW for assistance with setting up [facility] aide  -anticipate discharge home tomorrow  Norm Parcel, PA-C 02/12/23 8:02 AM

## 2023-02-12 NOTE — Progress Notes (Signed)
Physical Therapy Treatment Patient Details Name: Evelyn [name] MRN: [id number] DOB: Oct 10, 1971 Today's Date: 02/12/2023   History of Present Illness Pt is a 52 y.o. 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 Pt admitted for intervention and is s/p left craniotomy for tumor resection 2/16. PMH: GERD, anxiety, depression, seizures, sleep apnea, obesity    PT Comments    Attempted to see pt late morning and she reported recently had meds that make her sleepy and asked to defer PT until after she sleeps if off. Mother had questions about home services (frequency, what to expect) and explained role of HHPT. Mom concerned about her ability to help pt bathe and stated she may need a nurses aide, but that OT would see pt today and help with determining how much help the patient needs.   On return in pm, pt up walking with OT exiting bathroom. Patient agreed to continue walking for PT session as OT was finished. Patient able to navigate the RW around obstacles without assist as she could partially open Rt eye today. Patient had one loss of balance with RW that required min assist to recover (in 130 ft of ambulation). Patient did not want to sit in chair, but agreed to sit upright in bed with ice applied to left side of face.    Recommendations for follow up therapy are one component of a multi-disciplinary discharge planning process, led by the attending physician.  Recommendations may be updated based on patient status, additional functional criteria and insurance authorization.  Follow Up Recommendations  Home health PT     Assistance Recommended at Discharge Frequent or constant Supervision/Assistance  Patient can return home with the following A little help with walking and/or transfers;Assist for transportation;Help with stairs or ramp for entrance;A  little help with bathing/dressing/bathroom   Equipment Recommendations  Rolling walker (2 wheels);BSC/3in1    Recommendations for Other Services       Precautions / Restrictions Precautions Precautions: Fall Precaution Comments: both eyes swollen with vision loss prior to surgery, blurry vision of R eye and almost swollen shut with left eye swollen shut Restrictions Weight Bearing Restrictions: No     Mobility  Bed Mobility Overal bed mobility: Needs Assistance Bed Mobility: Sit to Supine       Sit to supine: Modified independent (Device/Increase time)        Transfers                   General transfer comment: NT; pt up walking out of bathroom with OT on arrival    Ambulation/Gait Ambulation/Gait assistance: Min assist Gait Distance (Feet): 130 Feet Assistive device: Rolling walker (2 wheels) Gait Pattern/deviations: Decreased stride length, Step-through pattern Gait velocity: decr     General Gait Details: pt able to see well enough to steer RW; one imbalance requiring min assist to recover (and use of her UEs via RW)   Stairs             Wheelchair Mobility    Modified Rankin (Stroke Patients Only)       Balance Overall balance assessment: Needs assistance Sitting-balance support: Feet supported Sitting balance-Leahy Scale: Good Sitting balance - Comments: no LOB on EOB even with eyes closed   Standing balance support: Bilateral upper extremity supported Standing balance-Leahy Scale: Fair  Cognition Arousal/Alertness: Awake/alert (sleepy) Behavior During Therapy: Flat affect, WFL for tasks assessed/performed Overall Cognitive Status: Impaired/Different from baseline Area of Impairment: Safety/judgement, Problem solving, Memory                     Memory: Decreased short-term memory (pt reports)   Safety/Judgement: Decreased awareness of safety, Decreased awareness of deficits    Problem Solving: Requires verbal cues, Requires tactile cues General Comments: A&O x4, following instructions and no signs of impulsivity; requires verbal and tactile cues        Exercises      General Comments General comments (skin integrity, edema, etc.): Mother present. Mother was reinforcing to PT that she has had 4 back surgeries and cannot lift pt. She is interested in getting an aide to help pt bathe. Educated her that from a PT perspective pt did not need any lifting assist. Deferred discussion of aide to discuss with OT and OT made aware.      Pertinent Vitals/Pain Pain Assessment Pain Assessment: Faces Faces Pain Scale: Hurts even more Pain Location: headache Pain Descriptors / Indicators: Headache Pain Intervention(s): Limited activity within patient's tolerance, Monitored during session, Patient requesting pain meds-RN notified    Home Living                          Prior Function            PT Goals (current goals can now be found in the care plan section) Acute Rehab PT Goals Patient Stated Goal: to return to independent PT Goal Formulation: With patient/family Time For Goal Achievement: 02/23/23 Potential to Achieve Goals: Good Progress towards PT goals: Progressing toward goals    Frequency    Min 3X/week      PT Plan Current plan remains appropriate    Co-evaluation              AM-PAC PT "6 Clicks" Mobility   Outcome Measure  Help needed turning from your back to your side while in a flat bed without using bedrails?: None Help needed moving from lying on your back to sitting on the side of a flat bed without using bedrails?: None Help needed moving to and from a bed to a chair (including a wheelchair)?: A Little Help needed standing up from a chair using your arms (e.g., wheelchair or bedside chair)?: A Little Help needed to walk in hospital room?: A Little Help needed climbing 3-5 steps with a railing? : A Lot 6 Click  Score: 19    End of Session Equipment Utilized During Treatment: Gait belt Activity Tolerance: Patient limited by fatigue;Patient limited by pain Patient left: with call bell/phone within reach;with family/visitor present;in bed;with bed alarm set;with nursing/sitter in room Nurse Communication: Mobility status;Patient requests pain meds PT Visit Diagnosis: Other abnormalities of gait and mobility (R26.89);Muscle weakness (generalized) (M62.81);Pain Pain - part of body:  (head)     Time: [id number] PT Time Calculation (min) (ACUTE ONLY): 18 min  Charges:  $Gait Training: 8-22 mins                      [place]  Office [phone]    Rexanne Mano 02/12/2023, 2:56 PM

## 2023-02-13 LAB — GLUCOSE, CAPILLARY: Glucose-Capillary: 140 mg/dL — ABNORMAL HIGH (ref 70–99)

## 2023-02-13 MED ORDER — KETOROLAC TROMETHAMINE 30 MG/ML IJ SOLN
30.0000 mg | Freq: Three times a day (TID) | INTRAMUSCULAR | Status: DC | PRN
  Administered 2023-02-13 – 2023-02-15 (×4): 30 mg via INTRAVENOUS
  Filled 2023-02-13 (×5): qty 1

## 2023-02-13 NOTE — Progress Notes (Signed)
PT Cancellation Note  Patient Details Name: Evelyn [name] MRN: [id number] DOB: 1971/02/18   Cancelled Treatment:    Reason Eval/Treat Not Completed: Fatigue/lethargy limiting ability to participate  Attempted to see pt shortly after she had her Depakote and she already states she is feeling too sleepy to safely get up and walk. Reports she got up with nursing earlier today to go to bathroom. Asking PT to let her sleep x 1.5 hours and then will get up.   Mother and brother present and very concerned re: possibility of discharge home today. Emphasizing that if pt were to stumble, her mother cannot catch her due to her physical limitations. Patient did have staggering loss of balance with min assist needed 2/19 during therapy. As family support is not as robust as evaluating therapist anticipated, may need to consider inpatient Rehab to allow pt to be supervision level when she discharges home.  However, do question if pt could tolerate 3+ hours of therapy per day?  Also noted TOC team having difficulty obtaining [place] services at this time.    [place]  Office [phone]   Rexanne Mano 02/13/2023, 9:31 AM

## 2023-02-13 NOTE — Progress Notes (Addendum)
OT Cancellation Note  Patient Details Name: Evelyn [name] MRN: [id number] DOB: May 23, 1971   Cancelled Treatment:    Reason Eval/Treat Not Completed: Pain limiting ability to participate (Pt politey declining upon arrival stating, "It  feels like two dump trucks are slamming into my head." RN made aware of headache.)  Elliot Cousin 02/13/2023, 4:28 PM

## 2023-02-13 NOTE — TOC Progression Note (Addendum)
Transition of Care (TOC) - Progression Note  Marvetta Gibbons RN,BSN Transitions of Care Unit 4NP (Non Trauma)- RN Case Manager See Treatment Team for direct Phone #   Patient Details  Name: Evelyn [name] MRN: [id number] Date of Birth: 1971-01-15  Transition of Care [facility]) CM/SW Contact  Dahlia Client Romeo Rabon, RN Phone Number: 02/13/2023, 12:36 PM  Clinical Narrative:    Follow up done for transition needs and family concerns about returning home.  CM had reached out this am to [place] to f/u on referral yesterday, per Claiborne Billings they are on a delay for start of care at this time, CM made call to Amy w/ [facility]- who has working relationship w/ office protocols and Neuro MD- [place]- Amy to check with Office to see if they can staff and accept referral.   CM went to bedside to speak w/ pt, mom present at bedside and they express concern about taking pt home and having the adequate support at home. Mom asked to also call son- Karle Starch and his wife to conference call at bedside to discuss dispo needs and options. Per TC with son Karle Starch discussed that [place] was still being worked on- however even if [facility] is secured - pt would have limited visits for HHPT under her [employer] plan- before needing to change over to outpt therapy which has more covered benefits.  Also discussed rehab options as family is asking if rehab would be an option prior to coming home- explained that PT would need to re-eval- (PT is to see pt again this AM) and determine if pt can tolerate the intensity of INPT rehab vs SNF rehab- discussed the difference between INPT (3hrs of therapy per day) vs SNF level which is less intense- also explained that rehab would have to assess, bed offer needed as well as insurance approval. Family state that they would like to see if INPT rehab would be an option as first choice, voiced understanding that should rehab not be an option then they would need to take pt home w/ HH.   Also  provided info on Medicaid PCS services- son and his wife plan to f/u to start process (website info provided for them to get application) will need to f/u with pt's PCP.   DME needs will be addressed pending on final dispo plan, choice offered to son/family who state they do not have a preference.   Confirmed that when pt returns home- plan is to go to [place] home short term w/ her mom also staying with her there: Uncles name is [facility] (aunt Otila Kluver) Address is [address], [place] [address] Phone # to [phone]   Mom- Franchot Gallo- [phone]  [facility] will continue to follow for final dispo plans,   1130- Update- received msg from Amy w/ [facility]- they have accepted [facility] Referral and will be able to service pt for [facility] needs    Expected Discharge Plan: [place] Barriers to Discharge: Family Issues (family do not feel they can provide the needed care for pt at this time- want to look at rehab)  Expected Discharge Plan and Services   Discharge Planning Services: CM Consult, Mobile Meals Post Acute Care Choice: Durable Medical Equipment, Home Health Living arrangements for the past 2 months: Apartment, Single Family Home                           HH Arranged: PT, OT [facility] Agency: Latricia Heft  Home Health Date [facility] Agency Contacted: 02/13/23 Time HH Agency Contacted: 1030 Representative spoke with at [place]: Amy   Social Determinants of Health ([place]) Interventions SDOH Screenings   Tobacco Use: Low Risk  (02/09/2023)    Readmission Risk Interventions     No data to display

## 2023-02-13 NOTE — Progress Notes (Signed)
Physical Therapy Treatment Patient Details Name: Evelyn [name] MRN: [id number] DOB: 1971-06-10 Today's Date: 02/13/2023   History of Present Illness Pt is a 52 y.o. 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 Pt admitted for intervention and is s/p left craniotomy for tumor resection 2/16. PMH: GERD, anxiety, depression, seizures, sleep apnea, obesity    PT Comments    See PT note from earlier this morning re: discussion with pt and family re: discharge plan. Family describing that pt has to be at supervision level for them to be able to care for her at home (due to family members health issues). Patient requiring min guard assist with cues for transfers and safety and min assist for ambulation with RW. Her gait is slow, effortful, with legs feeling weak and occasional poor foot placement (too narrow) with imbalance requiring min assist to correct. Patient states she does not feel confident at all performing tasks and can only do them because she feels safe with gait belt and PT supporting her. Mother states no one is capable of walking with patient with her current needs. Discharge plan updated to inpatient rehab as pt making slower than anticipated progress and level of family support now requires that pt be at most supervision for all mobility and ADL tasks.     Recommendations for follow up therapy are one component of a multi-disciplinary discharge planning process, led by the attending physician.  Recommendations may be updated based on patient status, additional functional criteria and insurance authorization.  Follow Up Recommendations  Acute inpatient rehab (3hours/day)     Assistance Recommended at Discharge Frequent or constant Supervision/Assistance  Patient can return home with the following A little help with walking and/or transfers;Assist  for transportation;Help with stairs or ramp for entrance;A little help with bathing/dressing/bathroom   Equipment Recommendations  Rolling walker (2 wheels);BSC/3in1    Recommendations for Other Services Rehab consult     Precautions / Restrictions Precautions Precautions: Fall Precaution Comments: decr vision due to left eye swollen shut and rt eye nearly so Restrictions Weight Bearing Restrictions: No     Mobility  Bed Mobility Overal bed mobility: Modified Independent Bed Mobility: Supine to Sit, Sit to Supine     Supine to sit: Modified independent (Device/Increase time), HOB elevated Sit to supine: Modified independent (Device/Increase time), HOB elevated   General bed mobility comments: HOB elevated due to continued edema head/face    Transfers Overall transfer level: Needs assistance Equipment used: Rolling walker (2 wheels) Transfers: Sit to/from Stand Sit to Stand: Min guard           General transfer comment: vc for hand placement when standing from EOB; pt correctly pushed off armrests from toilet    Ambulation/Gait Ambulation/Gait assistance: Min assist Gait Distance (Feet): 150 Feet Assistive device: Rolling walker (2 wheels) Gait Pattern/deviations: Decreased stride length, Step-through pattern, Drifts right/left, Narrow base of support Gait velocity: decr     General Gait Details: pt able to see well enough to steer RW around obstacles, however tends to drift to her right when walking straight forward; one imbalance requiring min assist to recover (and use of her UEs via RW)   Stairs             Wheelchair Mobility    Modified Rankin (Stroke Patients Only)       Balance Overall balance assessment: Needs assistance Sitting-balance  support: Feet supported Sitting balance-Leahy Scale: Good Sitting balance - Comments: no LOB on EOB even with eyes closed   Standing balance support: During functional activity, No upper extremity  supported Standing balance-Leahy Scale: Fair                              Cognition Arousal/Alertness: Lethargic, Suspect due to medications Behavior During Therapy: Flat affect Overall Cognitive Status: Impaired/Different from baseline Area of Impairment: Safety/judgement, Problem solving                     Memory: Decreased short-term memory (pt reports; some word-finding issues)   Safety/Judgement: Decreased awareness of safety, Decreased awareness of deficits   Problem Solving: Requires verbal cues, Slow processing General Comments: A&O x4, following instructions and no signs of impulsivity; requires verbal and tactile cues        Exercises      General Comments General comments (skin integrity, edema, etc.): see earlier note re: discussion with pt's mother and brother earlier today related to discharge plan      Pertinent Vitals/Pain Pain Assessment Pain Assessment: 0-10 Pain Score: 10-Worst pain ever Pain Location: headache Pain Descriptors / Indicators: Headache Pain Intervention(s): Limited activity within patient's tolerance, Monitored during session, Premedicated before session, Repositioned    Home Living                          Prior Function            PT Goals (current goals can now be found in the care plan section) Acute Rehab PT Goals Patient Stated Goal: to return to independent PT Goal Formulation: With patient/family Time For Goal Achievement: 02/23/23 Potential to Achieve Goals: Good Progress towards PT goals: Progressing toward goals    Frequency    Min 3X/week      PT Plan Discharge plan needs to be updated    Co-evaluation              AM-PAC PT "6 Clicks" Mobility   Outcome Measure  Help needed turning from your back to your side while in a flat bed without using bedrails?: None Help needed moving from lying on your back to sitting on the side of a flat bed without using bedrails?:  None Help needed moving to and from a bed to a chair (including a wheelchair)?: A Little Help needed standing up from a chair using your arms (e.g., wheelchair or bedside chair)?: A Little Help needed to walk in hospital room?: A Little Help needed climbing 3-5 steps with a railing? : A Lot 6 Click Score: 19    End of Session Equipment Utilized During Treatment: Gait belt Activity Tolerance: Patient limited by pain Patient left: with call bell/phone within reach;with family/visitor present;in bed   PT Visit Diagnosis: Other abnormalities of gait and mobility (R26.89);Muscle weakness (generalized) (M62.81);Pain Pain - part of body:  (head)     Time: [id number] PT Time Calculation (min) (ACUTE ONLY): 32 min  Charges:  $Gait Training: 8-22 mins $Self Care/Home Management: [place], [place]  Office [phone]    Rexanne Mano 02/13/2023, 12:36 PM

## 2023-02-13 NOTE — Progress Notes (Signed)
Neurosurgery Service Progress Note  Subjective: No acute events overnight. She continues to have a headache, improves somewhat with prn norco. Continues to have difficulty with balance and blurred vision, but it is stable.    Objective: Vitals:   02/12/23 2327 02/13/23 0300 02/13/23 0821 02/13/23 1635  BP: 122/67 118/60 110/67 (!) 141/79  Pulse: 90 64  68  Resp: 20 18  13  $ Temp: 99.1 F (37.3 C) 99 F (37.2 C) 98.8 F (37.1 C) 97.6 F (36.4 C)  TempSrc: Axillary Axillary Oral Oral  SpO2: 97% 93% 98% 96%  Weight:      Height:        Physical Exam: Awake/alert, OD swollen shut, OS full ROM and normal VA with normal pupillary diameter / reactivity, Ox3, FS & SS, incision c/d/I, strength 5/5x4, no drift, speech fluent with normal content   Assessment & Plan: 52 y.o. woman w/ seizures s/p L pterional for large meningioma, recovering well.   -cont home keppra -final path grade 2 meningioma -cont PT/OT -toradol IV prn ordered for headaches -CIR approval pending, pt and family agree if denied they prefer home w/ HHPT   Of note, when I arrived in the room for rounding her father had a syncopal episode and was diaphoretic. He was moved to the chair and a RR was called. His vitals were taken, BP was 72/55 and he was transferred down to the ED for further evaluation.   Norm Parcel, PA-C 02/13/23 7:38 PM

## 2023-02-13 NOTE — Progress Notes (Signed)
° °  Inpatient Rehab Admissions Coordinator : ° °Per therapy change in recommendations, patient was screened for CIR candidacy by Reinhart Saulters RN MSN.  At this time patient appears to be a potential candidate for CIR. I will place a rehab consult per protocol for full assessment. Please call me with any questions. ° °Roben Schliep RN MSN °Admissions Coordinator °[phone] °  °

## 2023-02-14 NOTE — PMR Pre-admission (Signed)
PMR Admission Coordinator Pre-Admission Assessment  Patient: Evelyn [name] is an 52 y.o., female MRN: [id number] DOB: [birth year] Height: 5\' 7"  (170.2 cm) Weight: 133.8 kg  Insurance Information HMO:     PPO:      PCP:      IPA:      80/20:      OTHER:  PRIMARY: [employer] Medicaid       Policy#: [id number]      Subscriber: self CM Name: Burgess Amor      Phone#: [phone]     Fax#: [phone] Pre-Cert#: [id number] auth for 6 days from 02/15/23 to 02/20/23 with initial review due to day 6      Employer: Not employed Benefits:  Phone #: [phone]     Name: online providers Eff. Date: 06/24/2022     Deduct: $0      Out of Pocket Max: $0      Life Max: N/A CIR: 100%      SNF: 100% Outpatient: 100% for 27 visits/year     Co-Pay:   Home Health: 100% limited to 100 total visits per year      Co-Pay:   DME: 100%     Co-Pay:   Providers: in network  SECONDARY:       Policy#:      Phone#:   Artist:       Phone#:   The Data processing manager" for patients in Inpatient Rehabilitation Facilities with attached "Privacy Act Statement-Health Care Records" was provided and verbally reviewed with: N/A  Emergency Contact Information Contact Information     Name Relation Home Work Mobile   [place] Mother [phone]  [phone]   Jessy Oto   [phone]   Minda Meo Daughter   [phone]   Elnora Morrison [phone]         Current Medical History  Patient Admitting Diagnosis: Brain Tumor, s/p resection  History of Present Illness:  Pt is a 52 y.o. 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 Pt admitted to [facility] on 02/08/23 for intervention and underwent left craniotomy for tumor resection on 01/1523 by  Dr. Maurice Small. Final pathology revealed grade 2 meningioma. Pt.tolerated procedure well and began working with PT/OT/ SLP post op. They recommended CIR to assist return to PLOF.      Patient's medical record from [facility]  has been reviewed by the rehabilitation admission coordinator and physician.  Past Medical History  Past Medical History:  Diagnosis Date   Anemia    Anxiety    Depression    GERD (gastroesophageal reflux disease)    Headache    Obesity    Seizures (HCC)    Sleep apnea     Has the patient had major surgery during 100 days prior to admission? Yes  Family History   family history includes Breast cancer in her paternal aunt and paternal grandmother; Breast cancer (age of onset: 14) in her mother.  Current Medications  Current Facility-Administered Medications:    acetaminophen (TYLENOL) tablet 650 mg, 650 mg, Oral, Q4H PRN, 650 mg at 02/14/23 0529 **OR** acetaminophen (TYLENOL) suppository 650 mg, 650 mg, Rectal, Q4H PRN, Iran Sizer, PA-C, 650 mg at 02/09/23 0538   docusate sodium (COLACE) capsule 100 mg, 100 mg, Oral, BID, Iran Sizer, PA-C, 100 mg at 02/15/23  1029   DULoxetine (CYMBALTA) DR capsule 60 mg, 60 mg, Oral, Daily, Iran Sizer, PA-C, 60 mg at 02/15/23 1029   famotidine (PEPCID) tablet 20 mg, 20 mg, Oral, BID, Ostergard, Clovis Pu, MD, 20 mg at 02/15/23 1029   heparin injection 5,000 Units, 5,000 Units, Subcutaneous, Q8H, Cosentino, Talmadge Chad, PA-C, 5,000 Units at 02/15/23 1453   HYDROcodone-acetaminophen (NORCO/VICODIN) 5-325 MG per tablet 1 tablet, 1 tablet, Oral, Q4H PRN, Iran Sizer, PA-C, 1 tablet at 02/14/23 0857   HYDROmorphone (DILAUDID) injection 0.5 mg, 0.5 mg, Intravenous, Q3H PRN, Iran Sizer, PA-C, 0.5 mg at 02/14/23 1921   ketorolac (TORADOL) 30 MG/ML injection 30 mg, 30 mg, Intravenous, Q8H PRN, Iran Sizer, PA-C, 30 mg at 02/15/23 0851   labetalol (NORMODYNE)  injection 10-40 mg, 10-40 mg, Intravenous, Q10 min PRN, Iran Sizer, PA-C, 40 mg at 02/08/23 1952   levETIRAcetam (KEPPRA) tablet 750 mg, 750 mg, Oral, BID, Cosentino, Allison R, PA-C, 750 mg at 02/15/23 1029   ondansetron (ZOFRAN) tablet 4 mg, 4 mg, Oral, Q4H PRN, 4 mg at 02/14/23 0909 **OR** ondansetron (ZOFRAN) injection 4 mg, 4 mg, Intravenous, Q4H PRN, Iran Sizer, PA-C, 4 mg at 02/15/23 0851   polyethylene glycol (MIRALAX / GLYCOLAX) packet 17 g, 17 g, Oral, Daily PRN, Iran Sizer, PA-C, 17 g at 02/12/23 0906   promethazine (PHENERGAN) tablet 12.5-25 mg, 12.5-25 mg, Oral, Q4H PRN, Iran Sizer, PA-C, 25 mg at 02/14/23 1716  Patients Current Diet:  Diet Order             Diet - low sodium heart healthy           Diet regular Room service appropriate? Yes; Fluid consistency: Thin  Diet effective now                   Precautions / Restrictions Precautions Precautions: Fall Precaution Comments: decr vision due to OS swollen shut and right OD blurry Restrictions Weight Bearing Restrictions: No   Has the patient had 2 or more falls or a fall with injury in the past year? No  Prior Activity Level Pt. Active in the community, going out daily   Prior Functional Level Self Care: Did the patient need help bathing, dressing, using the toilet or eating? Independent  Indoor Mobility: Did the patient need assistance with walking from room to room (with or without device)? Independent  Stairs: Did the patient need assistance with internal or external stairs (with or without device)? Independent  Functional Cognition: Did the patient need help planning regular tasks such as shopping or remembering to take medications? Independent  Patient Information Are you of Hispanic, Latino/a,or Spanish origin?: A. No, not of Hispanic, Latino/a, or Spanish origin What is your race?: B. Black or African American Do you need or want an interpreter to communicate  with a doctor or health care staff?: 0. No  Patient's Response To:  Health Literacy and Transportation Is the patient able to respond to health literacy and transportation needs?: Yes Health Literacy - How often do you need to have someone help you when you read instructions, pamphlets, or other written material from your doctor or pharmacy?: Never In the past 12 months, has lack of transportation kept you from medical appointments or from getting medications?: No In the past 12 months, has lack of transportation kept you from meetings, work, or from getting things needed for daily living?: No  Journalist, newspaper / Equipment Home Equipment: Shower  seat, Grab bars - tub/shower, Hand held shower head  Prior Device Use: Indicate devices/aids used by the patient prior to current illness, exacerbation or injury? None of the above  Current Functional Level Cognition  Overall Cognitive Status: Within Functional Limits for tasks assessed Orientation Level: Oriented X4 Safety/Judgement: Decreased awareness of safety, Decreased awareness of deficits General Comments: A&O x4, following instructions and no signs of impulsivity; requires verbal cues    Extremity Assessment (includes Sensation/Coordination)  Upper Extremity Assessment: RUE deficits/detail, LUE deficits/detail RUE Deficits / Details: Unable to demonstrate functional ROM which may have been related to fatigue. Able to demonstrate A/ROM shoulder flexion to 90 degrees although unable to sustain hold. MMT: shoulder flexion: 3/5, abduction: 3+/5, IR/er: 4/5, impaired gross grasp. RUE Sensation: decreased light touch (Reports numbness/tingling in right UE.) RUE Coordination:  (WFL) LUE Deficits / Details: Functional A/ROM in all ranges. Strength: 5/5 shoulder, elbow in all ranges. Intact gross grasp.  Lower Extremity Assessment: Defer to PT evaluation RLE Deficits / Details: AROM WFL, some tremor holding leg up antirgravity so strength  at least 3/5 RLE Sensation: WNL    ADLs  Overall ADL's : Needs assistance/impaired Grooming: Wash/dry hands, Supervision/safety, Standing Lower Body Dressing: Set up Lower Body Dressing Details (indicate cue type and reason): Sitting on EOB, pt was able to don [employer] Transfer: Min guard, BSC/3in1, Rolling walker (2 wheels) Toilet Transfer Details (indicate cue type and reason): BSC over toilet Toileting- Clothing Manipulation and Hygiene: Sitting/lateral lean, Supervision/safety General ADL Comments: Based on clinical judgement pt will require at least mod-max assist to complete BADL tasks.    Mobility  Overal bed mobility: Modified Independent Bed Mobility: Supine to Sit, Sit to Supine Supine to sit: Modified independent (Device/Increase time), HOB elevated Sit to supine: Modified independent (Device/Increase time), HOB elevated General bed mobility comments: HOB elevated due to continued edema head/face; up in bathroom on arrival    Transfers  Overall transfer level: Needs assistance Equipment used: Rolling walker (2 wheels) Transfers: Sit to/from Stand Sit to Stand: Supervision Bed to/from chair/wheelchair/BSC transfer type:: Step pivot Step pivot transfers: Min guard General transfer comment: No VC required for hand placement. Pt places both hands on RW although utilizes her legs to stand and does not pull on RW.    Ambulation / Gait / Stairs / Wheelchair Mobility  Ambulation/Gait Ambulation/Gait assistance: Editor, commissioning (Feet): 150 Feet Assistive device: Rolling walker (2 wheels) Gait Pattern/deviations: Decreased stride length, Step-through pattern, Drifts right/left, Narrow base of support General Gait Details: pt able to see well enough to steer RW around obstacles, however tends to drift to her right when walking straight forward or turning right; two instances of imbalance requiring use of her UEs via RW to self-correct Gait velocity: decr more than  usual due to nausea    Posture / Balance Dynamic Sitting Balance Sitting balance - Comments: no LOB on EOB even with eyes closed Balance Overall balance assessment: Needs assistance Sitting-balance support: Feet supported Sitting balance-Leahy Scale: Good Sitting balance - Comments: no LOB on EOB even with eyes closed Standing balance support: Single extremity supported, No upper extremity supported Standing balance-Leahy Scale: Fair Standing balance comment: pt completed static standing balance task while placing/removing Squigz from vertical surface using either right or left hand. Able to complete without LOB.    Special needs/care consideration Skin surgical site    Previous Home Environment (from acute therapy documentation) Living Arrangements: Children  Lives With: Family Available Help at Discharge: Family Type  of Home: House Home Layout: One level Home Access: Ramped entrance Bathroom Shower/Tub: Health visitor: Handicapped height Additional Comments: plans to stay with uncle and aunt and mother at discharge  Discharge Living Setting Plans for Discharge Living Setting: House Type of Home at Discharge: House Discharge Home Layout: One level Discharge Home Access: Ramped entrance Discharge Bathroom Shower/Tub: Walk-in shower Discharge Bathroom Toilet: Handicapped height Discharge Bathroom Accessibility: Yes How Accessible: Accessible via walker, Accessible via wheelchair Does the patient have any problems obtaining your medications?: No  Social/Family/Support Systems Patient Roles: Other (Comment), Parent Contact Information: Mother Ria Comment [phone] to be her primary caregiver. Her aunt and uncle will also be in the home, though they have disabilities. Son, Renae Gloss, is the POA and decision maker but does not plan to provide physical care for Pt. Ability/Limitations of Caregiver: Caregivers can provide 24/7 supervision to min A Caregiver  Availability: 24/7 Discharge Plan Discussed with Primary Caregiver: Yes Is Caregiver In Agreement with Plan?: Yes Does Caregiver/Family have Issues with Lodging/Transportation while Pt is in Rehab?: No  Goals Patient/Family Goal for Rehab: PT/OT/ Supervision to mod I Expected length of stay: 7-10 days Pt/Family Agrees to Admission and willing to participate: Yes Program Orientation Provided & Reviewed with Pt/Caregiver Including Roles  & Responsibilities: No  Decrease burden of Care through IP rehab admission: Othern/a   Possible need for SNF placement upon discharge: none   Patient Condition: I have reviewed medical records from [facility], spoken with CM, and patient, son, and family member. I met with patient at the bedside and discussed via phone for inpatient rehabilitation assessment.  Patient will benefit from ongoing PT and OT, can actively participate in 3 hours of therapy a day 5 days of the week, and can make measurable gains during the admission.  Patient will also benefit from the coordinated team approach during an Inpatient Acute Rehabilitation admission.  The patient will receive intensive therapy as well as Rehabilitation physician, nursing, social worker, and care management interventions.  Due to bladder management, bowel management, safety, skin/wound care, disease management, medication administration, pain management, and patient education the patient requires 24 hour a day rehabilitation nursing.  The patient is currently min assist with mobility and basic ADLs.  Discharge setting and therapy post discharge at home with home health is anticipated.  Patient has agreed to participate in the Acute Inpatient Rehabilitation Program and will admit today.  Preadmission Screen Completed By:  Trish Mage, 02/15/2023 3:13 PM ______________________________________________________________________   Discussed status with Dr. Riley Kill on 02/15/23 at 0945 and received approval for  admission today.  Admission Coordinator:  Trish Mage, RN, time 1513/Date 02/15/23   Assessment/Plan: Diagnosis: Does the need for close, 24 hr/day Medical supervision in concert with the patient's rehab needs make it unreasonable for this patient to be served in a less intensive setting? Yes Co-Morbidities requiring supervision/potential complications: Meningioma s/p resection and L craniotomy, c/b seizure, headaches, vision difficulty, leukocytosis, ABLA, nausea, and constipation Due to bladder management, bowel management, safety, skin/wound care, disease management, medication administration, pain management, and patient education, does the patient require 24 hr/day rehab nursing? Yes Does the patient require coordinated care of a physician, rehab nurse, PT, OT address physical and functional deficits in the context of the above medical diagnosis(es)? Yes Addressing deficits in the following areas: balance, endurance, locomotion, strength, transferring, bathing, dressing, feeding, grooming, and toileting Can the patient actively participate in an intensive therapy program of at least 3 hrs of therapy 5  days a week? Yes The potential for patient to make measurable gains while on inpatient rehab is excellent Anticipated functional outcomes upon discharge from inpatient rehab: supervision PT, supervision OT,  Estimated rehab length of stay to reach the above functional goals is: 7-10 days Anticipated discharge destination: Home 10. Overall Rehab/Functional Prognosis: good   MD Signature:  Angelina Sheriff, DO 02/15/2023

## 2023-02-14 NOTE — Progress Notes (Signed)
OT Cancellation Note  Patient Details Name: Evelyn [name] MRN: [id number] DOB: [birth year]   Cancelled Treatment:    Reason Eval/Treat Not Completed: Pain limiting ability to participate (Pt politely declined participating in OT session this afternoon. Had recently received pain medication due to headache although now experiencing nausea.)  Ailene Ravel, OTR/L,CBIS  Supplemental OT - [facility] and [facility] Secure Chat Preferred   02/14/2023, 3:05 PM

## 2023-02-14 NOTE — Progress Notes (Signed)
Physical Therapy Treatment Patient Details Name: Evelyn [name] MRN: [id number] DOB: Oct 14, 1971 Today's Date: 02/14/2023   History of Present Illness Pt is a 52 y.o. 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 Pt admitted for intervention and is s/p left craniotomy for tumor resection 2/16. PMH: GERD, anxiety, depression, seizures, sleep apnea, obesity    PT Comments    Patient reports pain level is decreased since adding toradol to pain med regimen. She had brighter affect and was already up with tech in bathroom working on bathing/dressing. Despite fatigue from bathing, she was agreeable to work with PT. Ambulation remains unsteady with need for RW with pt twice self-correcting her imbalance through use of UEs on RW. She continues with narrow base of support and drifting toward her right. She is making slow progress and can benefit from additional therapies in inpt setting prior to return home.     Recommendations for follow up therapy are one component of a multi-disciplinary discharge planning process, led by the attending physician.  Recommendations may be updated based on patient status, additional functional criteria and insurance authorization.  Follow Up Recommendations  Acute inpatient rehab (3hours/day)     Assistance Recommended at Discharge Frequent or constant Supervision/Assistance  Patient can return home with the following A little help with walking and/or transfers;Assist for transportation;Help with stairs or ramp for entrance;A little help with bathing/dressing/bathroom   Equipment Recommendations  Rolling walker (2 wheels);BSC/3in1    Recommendations for Other Services       Precautions / Restrictions Precautions Precautions: Fall Precaution Comments: decr vision due to left eye swollen shut and rt eye nearly  so Restrictions Weight Bearing Restrictions: No     Mobility  Bed Mobility Overal bed mobility: Modified Independent Bed Mobility: Sit to Supine       Sit to supine: Modified independent (Device/Increase time), HOB elevated   General bed mobility comments: HOB elevated due to continued edema head/face; up in bathroom on arrival    Transfers Overall transfer level: Needs assistance Equipment used: Rolling walker (2 wheels) Transfers: Sit to/from Stand Sit to Stand: Min guard           General transfer comment: vc for hand placement when standing from [facility]    Ambulation/Gait Ambulation/Gait assistance: Min assist Gait Distance (Feet): 150 Feet Assistive device: Rolling walker (2 wheels) Gait Pattern/deviations: Decreased stride length, Step-through pattern, Drifts right/left, Narrow base of support Gait velocity: decr more than usual due to nausea     General Gait Details: pt able to see well enough to steer RW around obstacles, however tends to drift to her right when walking straight forward or turning right; two instances of imbalance requiring use of her UEs via RW to self-correct   Stairs             Wheelchair Mobility    Modified Rankin (Stroke Patients Only)       Balance Overall balance assessment: Needs assistance Sitting-balance support: Feet supported Sitting balance-Leahy Scale: Good Sitting balance - Comments: no LOB on EOB even with eyes closed   Standing balance support: During functional activity, No upper extremity supported Standing balance-Leahy Scale: Fair                              Cognition Arousal/Alertness: Awake/alert Behavior During Therapy: WFL for tasks  assessed/performed Overall Cognitive Status: Impaired/Different from baseline Area of Impairment: Problem solving                             Problem Solving: Requires verbal cues, Slow processing General Comments: A&O x4, following  instructions and no signs of impulsivity; requires verbal cues        Exercises      General Comments General comments (skin integrity, edema, etc.): Mother present. Reports pt to go to [facility] once insurance approves      Pertinent Vitals/Pain Pain Assessment Pain Assessment: Faces Faces Pain Scale: Hurts little more Pain Location: headache Pain Descriptors / Indicators: Headache    Home Living                          Prior Function            PT Goals (current goals can now be found in the care plan section) Acute Rehab PT Goals Patient Stated Goal: to return to independent Time For Goal Achievement: 02/23/23 Potential to Achieve Goals: Good Progress towards PT goals: Progressing toward goals    Frequency    Min 4X/week      PT Plan Current plan remains appropriate;Frequency needs to be updated    Co-evaluation              AM-PAC PT "6 Clicks" Mobility   Outcome Measure  Help needed turning from your back to your side while in a flat bed without using bedrails?: None Help needed moving from lying on your back to sitting on the side of a flat bed without using bedrails?: None Help needed moving to and from a bed to a chair (including a wheelchair)?: A Little Help needed standing up from a chair using your arms (e.g., wheelchair or bedside chair)?: A Little Help needed to walk in hospital room?: A Little Help needed climbing 3-5 steps with a railing? : A Lot 6 Click Score: 19    End of Session Equipment Utilized During Treatment: Gait belt Activity Tolerance: Patient limited by fatigue Patient left: with call bell/phone within reach;with family/visitor present;in bed;with bed alarm set   PT Visit Diagnosis: Other abnormalities of gait and mobility (R26.89);Muscle weakness (generalized) (M62.81);Pain Pain - part of body:  (head)     Time: [id number] PT Time Calculation (min) (ACUTE ONLY): 31 min  Charges:  $Gait Training: 23-37 mins                       [place]  Office [phone]    Rexanne Mano 02/14/2023, 10:22 AM

## 2023-02-14 NOTE — Progress Notes (Signed)
Neurosurgery Service Progress Note  Subjective: No acute events overnight. Headaches are improved, toradol prn helps significantly. Continues to have difficulty with balance and blurred vision, but it is stable.     Objective: Vitals:   02/13/23 1952 02/13/23 2306 02/14/23 0345 02/14/23 0753  BP:  (!) 119/44 103/63 (!) 94/54  Pulse:   76 67  Resp:   16 18  Temp: 98.6 F (37 C) 99 F (37.2 C) 98.8 F (37.1 C) 99.1 F (37.3 C)  TempSrc: Oral Oral Oral Oral  SpO2:   96% 96%  Weight:      Height:        Physical Exam: Awake/alert, OD swollen shut, OS full ROM and normal VA with normal pupillary diameter / reactivity, Ox3, FS & SS, incision c/d/I, strength 5/5x4, no drift, speech fluent with normal content   Assessment & Plan: 52 y.o. woman w/ seizures s/p L pterional for large meningioma, recovering well.   -cont home keppra -final path grade 2 meningioma -cont PT/OT -CIR approval pending, pt and family agree if denied they prefer home w/ HHPT  Norm Parcel, PA-C 02/14/23 9:08 AM

## 2023-02-14 NOTE — Progress Notes (Signed)
Inpatient Rehab Admissions Coordinator:    I spoke with Pt., her mother, her son Karle Starch, and his wife regarding potential CIR admit. They are interested in CIR for Pt. And state that she will go to her uncle's handicap accessible apartment and that her uncle (a double amputee) and her aunt and mother will be there to provide supervision to light min assist. Plan would be for Pt. To participate in an intensive rehab program for 7-10 days and discharge at supervision to mod I level. I will open a case with her insurance and pursue for admit.   Clemens Catholic, [place], [place] Admissions Coordinator  [phone] ([place]) [phone] (office)

## 2023-02-14 NOTE — Progress Notes (Signed)
SLP Cancellation Note  Patient Details Name: Evelyn [name] MRN: [id number] DOB: 07/03/1971   Cancelled treatment:       Reason Eval/Treat Not Completed: Other (comment). Pt tolerating regular diet but currently sleeping. May be suffering from acid reflux symptoms given mothers reports of some indigestion yesterday. No need for further SLP interventions. Will sign off.    Harshal Sirmon, Katherene Ponto 02/14/2023, 12:02 PM

## 2023-02-15 ENCOUNTER — Encounter (HOSPITAL_COMMUNITY): Payer: Self-pay | Admitting: Neurological Surgery

## 2023-02-15 ENCOUNTER — Inpatient Hospital Stay (HOSPITAL_COMMUNITY)
Admission: RE | Admit: 2023-02-15 | Discharge: 2023-02-26 | DRG: 949 | Disposition: A | Discharge status: Home / Self Care | Payer: Medicaid Other | Source: Intra-hospital | Attending: Physical Medicine and Rehabilitation | Admitting: Physical Medicine and Rehabilitation

## 2023-02-15 ENCOUNTER — Other Ambulatory Visit: Payer: Self-pay

## 2023-02-15 ENCOUNTER — Encounter (HOSPITAL_COMMUNITY): Payer: Self-pay | Admitting: Physical Medicine and Rehabilitation

## 2023-02-15 DIAGNOSIS — R42 Dizziness and giddiness: Secondary | ICD-10-CM | POA: Diagnosis present

## 2023-02-15 DIAGNOSIS — G9389 Other specified disorders of brain: Secondary | ICD-10-CM | POA: Diagnosis not present

## 2023-02-15 DIAGNOSIS — R519 Headache, unspecified: Secondary | ICD-10-CM | POA: Diagnosis not present

## 2023-02-15 DIAGNOSIS — I10 Essential (primary) hypertension: Secondary | ICD-10-CM | POA: Diagnosis present

## 2023-02-15 DIAGNOSIS — Z90710 Acquired absence of both cervix and uterus: Secondary | ICD-10-CM

## 2023-02-15 DIAGNOSIS — H5212 Myopia, left eye: Secondary | ICD-10-CM | POA: Diagnosis present

## 2023-02-15 DIAGNOSIS — D320 Benign neoplasm of cerebral meninges: Secondary | ICD-10-CM | POA: Diagnosis not present

## 2023-02-15 DIAGNOSIS — Z7952 Long term (current) use of systemic steroids: Secondary | ICD-10-CM

## 2023-02-15 DIAGNOSIS — F32A Depression, unspecified: Secondary | ICD-10-CM | POA: Diagnosis present

## 2023-02-15 DIAGNOSIS — M542 Cervicalgia: Secondary | ICD-10-CM | POA: Diagnosis present

## 2023-02-15 DIAGNOSIS — G96198 Other disorders of meninges, not elsewhere classified: Secondary | ICD-10-CM | POA: Diagnosis not present

## 2023-02-15 DIAGNOSIS — N179 Acute kidney failure, unspecified: Secondary | ICD-10-CM | POA: Diagnosis present

## 2023-02-15 DIAGNOSIS — D649 Anemia, unspecified: Secondary | ICD-10-CM | POA: Diagnosis present

## 2023-02-15 DIAGNOSIS — K5900 Constipation, unspecified: Secondary | ICD-10-CM | POA: Diagnosis not present

## 2023-02-15 DIAGNOSIS — E871 Hypo-osmolality and hyponatremia: Secondary | ICD-10-CM | POA: Diagnosis not present

## 2023-02-15 DIAGNOSIS — H02402 Unspecified ptosis of left eyelid: Secondary | ICD-10-CM | POA: Diagnosis present

## 2023-02-15 DIAGNOSIS — Z9049 Acquired absence of other specified parts of digestive tract: Secondary | ICD-10-CM

## 2023-02-15 DIAGNOSIS — I951 Orthostatic hypotension: Secondary | ICD-10-CM | POA: Diagnosis not present

## 2023-02-15 DIAGNOSIS — Z86011 Personal history of benign neoplasm of the brain: Secondary | ICD-10-CM

## 2023-02-15 DIAGNOSIS — F4323 Adjustment disorder with mixed anxiety and depressed mood: Secondary | ICD-10-CM | POA: Diagnosis not present

## 2023-02-15 DIAGNOSIS — Z9884 Bariatric surgery status: Secondary | ICD-10-CM | POA: Diagnosis not present

## 2023-02-15 DIAGNOSIS — R5381 Other malaise: Secondary | ICD-10-CM | POA: Diagnosis present

## 2023-02-15 DIAGNOSIS — Z79899 Other long term (current) drug therapy: Secondary | ICD-10-CM

## 2023-02-15 DIAGNOSIS — Z483 Aftercare following surgery for neoplasm: Principal | ICD-10-CM

## 2023-02-15 DIAGNOSIS — Z886 Allergy status to analgesic agent status: Secondary | ICD-10-CM | POA: Diagnosis not present

## 2023-02-15 DIAGNOSIS — Z885 Allergy status to narcotic agent status: Secondary | ICD-10-CM

## 2023-02-15 DIAGNOSIS — Z803 Family history of malignant neoplasm of breast: Secondary | ICD-10-CM | POA: Diagnosis not present

## 2023-02-15 DIAGNOSIS — R110 Nausea: Secondary | ICD-10-CM | POA: Diagnosis present

## 2023-02-15 DIAGNOSIS — R569 Unspecified convulsions: Secondary | ICD-10-CM | POA: Diagnosis present

## 2023-02-15 DIAGNOSIS — F4322 Adjustment disorder with anxiety: Secondary | ICD-10-CM

## 2023-02-15 MED ORDER — ALUM & MAG HYDROXIDE-SIMETH 200-200-20 MG/5ML PO SUSP
30.0000 mL | ORAL | Status: DC | PRN
  Administered 2023-02-24: 30 mL via ORAL
  Filled 2023-02-15: qty 30

## 2023-02-15 MED ORDER — SORBITOL 70 % SOLN
30.0000 mL | Freq: Every day | Status: DC | PRN
  Filled 2023-02-15: qty 30

## 2023-02-15 MED ORDER — GABAPENTIN 300 MG PO CAPS
300.0000 mg | ORAL_CAPSULE | Freq: Three times a day (TID) | ORAL | Status: DC | PRN
  Administered 2023-02-16: 300 mg via ORAL
  Filled 2023-02-15: qty 1

## 2023-02-15 MED ORDER — HEPARIN SODIUM (PORCINE) 5000 UNIT/ML IJ SOLN
5000.0000 [IU] | Freq: Three times a day (TID) | INTRAMUSCULAR | Status: DC
  Administered 2023-02-15 – 2023-02-26 (×31): 5000 [IU] via SUBCUTANEOUS
  Filled 2023-02-15 (×32): qty 1

## 2023-02-15 MED ORDER — GUAIFENESIN-DM 100-10 MG/5ML PO SYRP: 5.0000 mL | ORAL_SOLUTION | Freq: Four times a day (QID) | ORAL | Status: DC | PRN

## 2023-02-15 MED ORDER — LEVETIRACETAM 750 MG PO TABS
750.0000 mg | ORAL_TABLET | Freq: Two times a day (BID) | ORAL | Status: DC
  Administered 2023-02-15 – 2023-02-26 (×22): 750 mg via ORAL
  Filled 2023-02-15 (×22): qty 1

## 2023-02-15 MED ORDER — POLYETHYLENE GLYCOL 3350 17 G PO PACK: 17.0000 g | PACK | Freq: Every day | ORAL | Status: DC | PRN

## 2023-02-15 MED ORDER — FAMOTIDINE 20 MG PO TABS
20.0000 mg | ORAL_TABLET | Freq: Two times a day (BID) | ORAL | Status: DC
  Administered 2023-02-15 – 2023-02-26 (×22): 20 mg via ORAL
  Filled 2023-02-15 (×22): qty 1

## 2023-02-15 MED ORDER — PROCHLORPERAZINE MALEATE 5 MG PO TABS
5.0000 mg | ORAL_TABLET | Freq: Four times a day (QID) | ORAL | Status: DC | PRN
  Administered 2023-02-24 (×2): 5 mg via ORAL
  Administered 2023-02-25: 10 mg via ORAL
  Administered 2023-02-26: 5 mg via ORAL
  Filled 2023-02-15: qty 1
  Filled 2023-02-15 (×2): qty 2
  Filled 2023-02-15: qty 1

## 2023-02-15 MED ORDER — PROCHLORPERAZINE 25 MG RE SUPP: 12.5000 mg | Freq: Four times a day (QID) | RECTAL | Status: DC | PRN

## 2023-02-15 MED ORDER — SENNOSIDES-DOCUSATE SODIUM 8.6-50 MG PO TABS
2.0000 | ORAL_TABLET | Freq: Once | ORAL | Status: AC
  Administered 2023-02-15: 2 via ORAL
  Filled 2023-02-15: qty 2

## 2023-02-15 MED ORDER — KETOROLAC TROMETHAMINE 30 MG/ML IJ SOLN
30.0000 mg | Freq: Three times a day (TID) | INTRAMUSCULAR | Status: AC | PRN
  Administered 2023-02-15 – 2023-02-16 (×2): 30 mg via INTRAVENOUS
  Filled 2023-02-15 (×4): qty 1

## 2023-02-15 MED ORDER — TRAZODONE HCL 50 MG PO TABS
25.0000 mg | ORAL_TABLET | Freq: Every evening | ORAL | Status: DC | PRN
  Administered 2023-02-17: 50 mg via ORAL
  Administered 2023-02-22 – 2023-02-23 (×2): 25 mg via ORAL
  Administered 2023-02-24 – 2023-02-25 (×2): 50 mg via ORAL
  Filled 2023-02-15 (×5): qty 1

## 2023-02-15 MED ORDER — DULOXETINE HCL 60 MG PO CPEP
60.0000 mg | ORAL_CAPSULE | Freq: Every day | ORAL | Status: DC
  Administered 2023-02-16 – 2023-02-26 (×11): 60 mg via ORAL
  Filled 2023-02-15 (×11): qty 1

## 2023-02-15 MED ORDER — DOCUSATE SODIUM 100 MG PO CAPS
100.0000 mg | ORAL_CAPSULE | Freq: Two times a day (BID) | ORAL | Status: DC
  Administered 2023-02-15 – 2023-02-18 (×6): 100 mg via ORAL
  Filled 2023-02-15 (×6): qty 1

## 2023-02-15 MED ORDER — POLYETHYLENE GLYCOL 3350 17 G PO PACK
17.0000 g | PACK | Freq: Two times a day (BID) | ORAL | Status: DC
  Administered 2023-02-16 – 2023-02-24 (×13): 17 g via ORAL
  Filled 2023-02-15 (×19): qty 1

## 2023-02-15 MED ORDER — HEPARIN SODIUM (PORCINE) 5000 UNIT/ML IJ SOLN: 5000.0000 [IU] | Freq: Three times a day (TID) | INTRAMUSCULAR | Status: DC

## 2023-02-15 MED ORDER — MAGNESIUM CITRATE PO SOLN: 1.0000 | Freq: Once | ORAL | Status: DC | PRN

## 2023-02-15 MED ORDER — METHOCARBAMOL 500 MG PO TABS
500.0000 mg | ORAL_TABLET | Freq: Four times a day (QID) | ORAL | Status: DC | PRN
  Administered 2023-02-15 – 2023-02-26 (×9): 500 mg via ORAL
  Filled 2023-02-15 (×9): qty 1

## 2023-02-15 MED ORDER — PROCHLORPERAZINE EDISYLATE 10 MG/2ML IJ SOLN: 5.0000 mg | Freq: Four times a day (QID) | INTRAMUSCULAR | Status: DC | PRN

## 2023-02-15 NOTE — H&P (Signed)
Physical Medicine and Rehabilitation Admission H&P       CC: Functional deficits secondary to left craniotomy secondary to meningioma   HPI: Evelyn [name] is a 53 year old female with new onset seizures with left- sided visual loss. Started on Keppra. 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 She underwent left craniotomy and tumor resection by Dr Zada Finders on 02/08/2023. Moderate headache. Vision improved. Keppra continued. Started on heparin [place] for VTE prophy. Expected left periorbital edema. Patient  requiring min guard assist with cues for transfers and safety and min assist for ambulation. Final path is grade 2 meningioma. Headaches improve with Toradol. The patient requires inpatient physical medicine and rehabilitation evaluations and treatment secondary to dysfunction due to large meningioma.   Main complaints are decrease vision acuity of left eye due to swelling and myopia as well as constipation. Having nausea and dizziness due ti hydrocodone>>>have discontinued. Currently getting toradol for h/a.   Review of Systems  Constitutional:  Negative for chills and fever.  HENT:  Negative for nosebleeds and sore throat.   Respiratory:  Positive for cough. Negative for sputum production.   Cardiovascular:  Negative for chest pain and palpitations.  Gastrointestinal:  Positive for constipation and nausea.  Genitourinary:  Negative for dysuria and urgency.  Musculoskeletal:  Negative for back pain and neck pain.  Neurological:  Positive for dizziness and headaches.  Psychiatric/Behavioral:  Positive for depression. The patient has insomnia.         Past Medical History:  Diagnosis Date   Anemia     Anxiety     Depression     GERD (gastroesophageal reflux disease)     Headache     Obesity     Seizures ([place])     Sleep apnea           Past Surgical History:  Procedure Laterality Date   ABDOMINAL HYSTERECTOMY       APPLICATION OF CRANIAL NAVIGATION N/A 02/08/2023     Procedure: APPLICATION OF CRANIAL NAVIGATION;  Surgeon: Judith Part, MD;  Location: [place];  Service: Neurosurgery;  Laterality: N/A;   BREAST LUMPECTOMY Left 1997   CHOLECYSTECTOMY       CRANIOTOMY Left 02/08/2023    Procedure: Left craniotomy for tumor resection with stealth navigation;  Surgeon: Judith Part, MD;  Location: [place];  Service: Neurosurgery;  Laterality: Left;   GASTRIC BYPASS   03/2017   THROAT SURGERY        removed scar tissue from throat d/t being intubated.    TUBAL LIGATION             Family History  Problem Relation Age of Onset   Breast cancer Mother 65   Breast cancer Paternal Aunt     Breast cancer Paternal Grandmother      Social History:  reports that she has never smoked. She has never used smokeless tobacco. She reports that she does not drink alcohol and does not use drugs. Allergies:       Allergies  Allergen Reactions   Dilaudid [Hydromorphone Hcl] Nausea And Vomiting   Morphine And Related Other (See Comments)      Fever and chills and convulsions   Vicodin [Hydrocodone-Acetaminophen] Nausea And Vomiting          Medications Prior to Admission  Medication Sig Dispense Refill   dexamethasone (DECADRON) 2 MG tablet Take 1 tablet (2 mg total) by mouth daily. 30 tablet 0   DULoxetine (CYMBALTA) 60 MG capsule  Take 60 mg by mouth daily.       levETIRAcetam (KEPPRA) 750 MG tablet Take 1 tablet (750 mg total) by mouth 2 (two) times daily. 120 tablet 11   Vitamin D, Ergocalciferol, (DRISDOL) 1.25 MG (50000 UNIT) CAPS capsule Take 50,000 Units by mouth once a week.              Home: Home Living Family/patient expects to be discharged to:: Private residence Living Arrangements: Children Available Help at Discharge: Family Type of Home: House Home Access: No Name: One level Bathroom Shower/Tub: Multimedia programmer: Handicapped height [place]: Civil engineer, contracting, Grab bars - tub/shower, Hand held  shower head Additional Comments: plans to stay with uncle and aunt and mother at discharge  Lives With: Family   Functional History: Prior Function Prior Level of Function : Independent/Modified Independent Mobility Comments: Did a lot of church work including music; 29 y/o daughter, normally lives with daughter only   Functional Status:  Mobility: Bed Mobility Overal bed mobility: Modified Independent Bed Mobility: Supine to Sit, Sit to Supine Supine to sit: Modified independent (Device/Increase time), HOB elevated Sit to supine: Modified independent (Device/Increase time), HOB elevated General bed mobility comments: HOB elevated due to continued edema head/face; up in bathroom on arrival Transfers Overall transfer level: Needs assistance Equipment used: Rolling walker (2 wheels) Transfers: Sit to/from Stand Sit to Stand: Supervision Bed to/from chair/wheelchair/BSC transfer type:: Step pivot Step pivot transfers: Min guard General transfer comment: No VC required for hand placement. Pt places both hands on RW although utilizes her legs to stand and does not pull on RW. Ambulation/Gait Ambulation/Gait assistance: Min assist Gait Distance (Feet): 150 Feet Assistive device: Rolling walker (2 wheels) Gait Pattern/deviations: Decreased stride length, Step-through pattern, Drifts right/left, Narrow base of support General Gait Details: pt able to see well enough to steer RW around obstacles, however tends to drift to her right when walking straight forward or turning right; two instances of imbalance requiring use of her UEs via RW to self-correct Gait velocity: decr more than usual due to nausea   ADL: ADL Overall ADL's : Needs assistance/impaired Grooming: Wash/dry hands, Supervision/safety, Standing Lower Body Dressing: Set up Lower Body Dressing Details (indicate cue type and reason): Sitting on EOB, pt was able to don [employer] Transfer: Min guard, BSC/3in1, Rolling walker  (2 wheels) Toilet Transfer Details (indicate cue type and reason): BSC over toilet Toileting- Clothing Manipulation and Hygiene: Sitting/lateral lean, Supervision/safety General ADL Comments: Based on clinical judgement pt will require at least mod-max assist to complete BADL tasks.   Cognition: Cognition Overall Cognitive Status: Within Functional Limits for tasks assessed Orientation Level: Oriented X4 Cognition Arousal/Alertness: Awake/alert Behavior During Therapy: Flat affect Overall Cognitive Status: Within Functional Limits for tasks assessed Area of Impairment: Problem solving Memory: Decreased short-term memory (pt reports; some word-finding issues) Safety/Judgement: Decreased awareness of safety, Decreased awareness of deficits Problem Solving: Requires verbal cues, Slow processing General Comments: A&O x4, following instructions and no signs of impulsivity; requires verbal cues   Physical Exam: Blood pressure (!) 111/57, pulse 78, temperature 99 F (37.2 C), resp. rate 18, height 5' 7"$  (1.702 m), weight 133.8 kg, SpO2 96 %. Physical Exam Constitutional: No apparent distress. Appropriate appearance for age. +Obese HENT: No JVD. Neck Supple. Trachea midline. Atraumatic, normocephalic. Eyes: PERRLA. EOMI. +Discomfort with superior gaze both monocular and binocular Cardiovascular: RRR, no murmurs/rub/gallops.  Peripheral pulses 2+  Respiratory: CTAB. No rales, rhonchi, or wheezing. On RA.  Abdomen: +  bowel sounds, normoactive. No distention or tenderness.  Skin: + L craniotomy site with absorbable sutures, site c/d/I, significant localized swelling MSK:      No apparent deformity.      Strength:                RUE: 5/5 SA, 5/5 EF, 5/5 EE, 5/5 WE, 5/5 FF, 5/5 FA                 LUE: 5/5 SA, 5/5 EF, 5/5 EE, 5/5 WE, 5/5 FF, 5/5 FA                 RLE: 5/5 HF, 5/5 KE, 5/5 DF, 5/5 EHL, 5/5 PF                 LLE:  5/5 HF, 5/5 KE, 5/5 DF, 5/5 EHL, 5/5 PF   Neurologic exam:   Cognition: AAO to person, place, time and event.  Language: Fluent, No substitutions or neoglisms. No dysarthria. Names 3/3 objects correctly.  Memory: Recalls 3/3 objects at 5 minutes. No apparent deficits  Insight: Good insight into current condition.  Mood: Pleasant affect, appropriate mood.  Sensation: To light touch intact in BL UEs and LEs  Reflexes: 2+ in BL UE and LEs. Negative Hoffman's and babinski signs bilaterally.  CN: + L V1 sensory deficit + L ptosis (? C/b periorbital edema) Coordination: No apparent tremors. + Mild LUE ataxia Spasticity: MAS 0 in all extremities.           Lab Results Last 48 Hours  No results found for this or any previous visit (from the past 48 hour(s)).   Imaging Results (Last 48 hours)  No results found.         Blood pressure (!) 111/57, pulse 78, temperature 99 F (37.2 C), resp. rate 18, height 5' 7"$  (1.702 m), weight 133.8 kg, SpO2 96 %.   Medical Problem List and Plan: 1. Functional deficits secondary to meningioma s/p craniotomy             -patient may shower             -ELOS/Goals: 7-10 days, supervision PT/OT   2.  Antithrombotics: -DVT/anticoagulation:  Pharmaceutical: Heparin             -antiplatelet therapy: none   3. Pain Management:  Ongoing post-op headache  - Can continue IV toradol for post-op Headache through admission overnight; transition to oral regimen in AM  - Patient endorses nausea/allergy to Vicodin/Norco/Dilaudid - seizures with Morphine - DC and add Gabapentin 300 mg PRN Q8H for pain, HA  - Per chart review ? Allergy to tylenol   4. Mood/Behavior/Sleep: LCSW to evaluate and provide emotional support             -continue Cymbalta 60 mg daily (depressive disorder)             -antipsychotic agents: n/a   5. Neuropsych/cognition: This patient is capable of making decisions on his own behalf.   6. Skin/Wound Care: Routine skin care checks   7. 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 Constipation. LBM PTA.   - Colace 100 mg BID + PRN Miralax inpatient; increase to BID miralax, Sennakot-S 2 tabs QHS  - PRN sorbitol, MOM, and Enema; encouraged patient to use a PRN tonight  Barbie Banner, PA-C 02/15/2023  I have examined the patient independently and edited the note for HPI, ROS, exam, assessment, and plan as appropriate. I am in agreement with the above recommendations.   Gertie Gowda, DO 02/15/2023

## 2023-02-15 NOTE — Discharge Summary (Signed)
Discharge Summary  Date of Admission: 02/08/2023  Date of Discharge: 02/15/23  Attending Physician: Emelda Brothers, MD  Hospital Course: Patient was admitted following an uncomplicated craniotomy for tumor resection. They were recovered in PACU and transferred to 4N ICU. Post-op MRI showed good resection with small residual, final path was grade 2 meningioma. PT/OT recommended CIR her hospital course was uncomplicated and the patient was discharged to CIR. They will follow up in clinic with me in clinic in 2 weeks.  Neurologic exam at discharge:  Strength 5/5 x4 and SILTx4   Discharge diagnosis: Intracranial meningioma  Judith Part, MD 02/15/23 7:56 AM

## 2023-02-15 NOTE — Progress Notes (Signed)
PMR Admission Coordinator Pre-Admission Assessment   Patient: Evelyn [name] is an 52 y.o., female MRN: [id number] DOB: 1971/01/18 Height: 5' 7"$  (170.2 cm) Weight: 133.8 kg   Insurance Information HMO:     PPO:      PCP:      IPA:      80/20:      OTHER:  PRIMARY: U[employer] Medicaid       Policy#: [id number]      Subscriber: self CM Name: Guerry Minors      Phone#: [phone]     Fax#: [phone] Pre-Cert#: [id number] auth for 6 days from 02/15/23 to 02/20/23 with initial review due to day 6      Employer: Not employed Benefits:  Phone #: [phone]     Name: online providers Eff. Date: 06/24/2022     Deduct: $0      Out of Pocket Max: $0      Life Max: N/A CIR: 100%      SNF: 100% Outpatient: 100% for 27 visits/year     Co-Pay:   Home Health: 100% limited to 100 total visits per year      Co-Pay:   DME: 100%     Co-Pay:   Providers: in network  SECONDARY:       Policy#:      Phone#:    Development worker, community:       Phone#:    The Engineer, petroleum" for patients in Inpatient Rehabilitation Facilities with attached "Privacy Act [place] Records" was provided and verbally reviewed with: N/A   Emergency Contact Information Contact Information       Name Relation Home Work [place] Mother [phone]   [phone]    Alvera Novel     [phone]    Eleanora Neighbor Daughter     [phone]    Sharlett Iles [phone]               Current Medical History  Patient Admitting Diagnosis: Brain Tumor, s/p resection   History of Present Illness:  Pt is a 52 y.o. 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 Pt admitted to [facility] on 02/08/23 for intervention and underwent left craniotomy for tumor  resection on 01/1523 by Dr. Zada Finders. Final pathology revealed grade 2 meningioma. Pt.tolerated procedure well and began working with PT/OT/ SLP post op. They recommended CIR to assist return to PLOF.        Patient's medical record from [facility]  has been reviewed by the rehabilitation admission coordinator and physician.   Past Medical History      Past Medical History:  Diagnosis Date   Anemia     Anxiety     Depression     GERD (gastroesophageal reflux disease)     Headache     Obesity     Seizures ([place])     Sleep apnea        Has the patient had major surgery during 100 days prior to admission? Yes   Family History   family history includes Breast cancer in her paternal aunt and paternal grandmother; Breast cancer (age of onset: 28) in her mother.   Current Medications   Current Facility-Administered Medications:    acetaminophen (TYLENOL) tablet 650 mg, 650 mg, Oral, Q4H PRN, 650 mg  at 02/14/23 0529 **OR** acetaminophen (TYLENOL) suppository 650 mg, 650 mg, Rectal, Q4H PRN, Collene Schlichter, PA-C, 650 mg at 02/09/23 [id number]   docusate sodium (COLACE) capsule 100 mg, 100 mg, Oral, BID, Collene Schlichter, PA-C, 100 mg at 02/15/23 1029   DULoxetine (CYMBALTA) DR capsule 60 mg, 60 mg, Oral, Daily, Collene Schlichter, PA-C, 60 mg at 02/15/23 1029   famotidine (PEPCID) tablet 20 mg, 20 mg, Oral, BID, Ostergard, Joyice Faster, MD, 20 mg at 02/15/23 1029   heparin injection 5,000 Units, 5,000 Units, Subcutaneous, Q8H, Cosentino, Laurin Coder, PA-C, 5,000 Units at 02/15/23 1453   HYDROcodone-acetaminophen (NORCO/VICODIN) 5-325 MG per tablet 1 tablet, 1 tablet, Oral, Q4H PRN, Collene Schlichter, PA-C, 1 tablet at 02/14/23 0857   HYDROmorphone (DILAUDID) injection 0.5 mg, 0.5 mg, Intravenous, Q3H PRN, Collene Schlichter, PA-C, 0.5 mg at 02/14/23 1921   ketorolac (TORADOL) 30 MG/ML injection 30 mg, 30 mg, Intravenous, Q8H PRN, Collene Schlichter, PA-C, 30 mg at  02/15/23 0851   labetalol (NORMODYNE) injection 10-40 mg, 10-40 mg, Intravenous, Q10 min PRN, Collene Schlichter, PA-C, 40 mg at 02/08/23 1952   levETIRAcetam (KEPPRA) tablet 750 mg, 750 mg, Oral, BID, Cosentino, Allison R, PA-C, 750 mg at 02/15/23 1029   ondansetron (ZOFRAN) tablet 4 mg, 4 mg, Oral, Q4H PRN, 4 mg at 02/14/23 0909 **OR** ondansetron (ZOFRAN) injection 4 mg, 4 mg, Intravenous, Q4H PRN, Collene Schlichter, PA-C, 4 mg at 02/15/23 0851   polyethylene glycol (MIRALAX / GLYCOLAX) packet 17 g, 17 g, Oral, Daily PRN, Collene Schlichter, PA-C, 17 g at 02/12/23 0906   promethazine (PHENERGAN) tablet 12.5-25 mg, 12.5-25 mg, Oral, Q4H PRN, Collene Schlichter, PA-C, 25 mg at 02/14/23 1716   Patients Current Diet:  Diet Order                  Diet - low sodium heart healthy             Diet regular Room service appropriate? Yes; Fluid consistency: Thin  Diet effective now                         Precautions / Restrictions Precautions Precautions: Fall Precaution Comments: decr vision due to OS swollen shut and right OD blurry Restrictions Weight Bearing Restrictions: No    Has the patient had 2 or more falls or a fall with injury in the past year? No   Prior Activity Level Pt. Active in the community, going out daily    Prior Functional Level Self Care: Did the patient need help bathing, dressing, using the toilet or eating? Independent   Indoor Mobility: Did the patient need assistance with walking from room to room (with or without device)? Independent   Stairs: Did the patient need assistance with internal or external stairs (with or without device)? Independent   Functional Cognition: Did the patient need help planning regular tasks such as shopping or remembering to take medications? Independent   Patient Information Are you of Hispanic, Latino/a,or Spanish origin?: A. No, not of Hispanic, Latino/a, or Spanish origin What is your race?: B. Black or African  American Do you need or want an interpreter to communicate with a doctor or health care staff?: 0. No   Patient's Response To:  Health Literacy and Transportation Is the patient able to respond to health literacy and transportation needs?: Yes Health Literacy - How often do you need to have someone help you when  you read instructions, pamphlets, or other written material from your doctor or pharmacy?: Never In the past 12 months, has lack of transportation kept you from medical appointments or from getting medications?: No In the past 12 months, has lack of transportation kept you from meetings, work, or from getting things needed for daily living?: No   Development worker, international aid / Equipment Home Equipment: Shower seat, Grab bars - tub/shower, Hand held shower head   Prior Device Use: Indicate devices/aids used by the patient prior to current illness, exacerbation or injury? None of the above   Current Functional Level Cognition   Overall Cognitive Status: Within Functional Limits for tasks assessed Orientation Level: Oriented X4 Safety/Judgement: Decreased awareness of safety, Decreased awareness of deficits General Comments: A&O x4, following instructions and no signs of impulsivity; requires verbal cues    Extremity Assessment (includes Sensation/Coordination)   Upper Extremity Assessment: RUE deficits/detail, LUE deficits/detail RUE Deficits / Details: Unable to demonstrate functional ROM which may have been related to fatigue. Able to demonstrate A/ROM shoulder flexion to 90 degrees although unable to sustain hold. MMT: shoulder flexion: 3/5, abduction: 3+/5, IR/er: 4/5, impaired gross grasp. RUE Sensation: decreased light touch (Reports numbness/tingling in right UE.) RUE Coordination:  (WFL) LUE Deficits / Details: Functional A/ROM in all ranges. Strength: 5/5 shoulder, elbow in all ranges. Intact gross grasp.  Lower Extremity Assessment: Defer to PT evaluation RLE Deficits /  Details: AROM WFL, some tremor holding leg up antirgravity so strength at least 3/5 RLE Sensation: WNL     ADLs   Overall ADL's : Needs assistance/impaired Grooming: Wash/dry hands, Supervision/safety, Standing Lower Body Dressing: Set up Lower Body Dressing Details (indicate cue type and reason): Sitting on EOB, pt was able to don [employer] Transfer: Min guard, BSC/3in1, Rolling walker (2 wheels) Toilet Transfer Details (indicate cue type and reason): BSC over toilet Toileting- Clothing Manipulation and Hygiene: Sitting/lateral lean, Supervision/safety General ADL Comments: Based on clinical judgement pt will require at least mod-max assist to complete BADL tasks.     Mobility   Overal bed mobility: Modified Independent Bed Mobility: Supine to Sit, Sit to Supine Supine to sit: Modified independent (Device/Increase time), HOB elevated Sit to supine: Modified independent (Device/Increase time), HOB elevated General bed mobility comments: HOB elevated due to continued edema head/face; up in bathroom on arrival     Transfers   Overall transfer level: Needs assistance Equipment used: Rolling walker (2 wheels) Transfers: Sit to/from Stand Sit to Stand: Supervision Bed to/from chair/wheelchair/BSC transfer type:: Step pivot Step pivot transfers: Min guard General transfer comment: No VC required for hand placement. Pt places both hands on RW although utilizes her legs to stand and does not pull on RW.     Ambulation / Gait / Stairs / Wheelchair Mobility   Ambulation/Gait Ambulation/Gait assistance: Herbalist (Feet): 150 Feet Assistive device: Rolling walker (2 wheels) Gait Pattern/deviations: Decreased stride length, Step-through pattern, Drifts right/left, Narrow base of support General Gait Details: pt able to see well enough to steer RW around obstacles, however tends to drift to her right when walking straight forward or turning right; two instances of imbalance  requiring use of her UEs via RW to self-correct Gait velocity: decr more than usual due to nausea     Posture / Balance Dynamic Sitting Balance Sitting balance - Comments: no LOB on EOB even with eyes closed Balance Overall balance assessment: Needs assistance Sitting-balance support: Feet supported Sitting balance-Leahy Scale: Good Sitting balance - Comments: no  LOB on EOB even with eyes closed Standing balance support: Single extremity supported, No upper extremity supported Standing balance-Leahy Scale: Fair Standing balance comment: pt completed static standing balance task while placing/removing [place] from vertical surface using either right or left hand. Able to complete without LOB.     Special needs/care consideration Skin surgical site     Previous Home Environment (from acute therapy documentation) Living Arrangements: Children  Lives With: Family Available Help at Discharge: Family Type of Home: House Home Layout: One level Home Access: Ramped entrance Bathroom Shower/Tub: Multimedia programmer: Handicapped height Additional Comments: plans to stay with uncle and aunt and mother at discharge   Discharge Living Setting Plans for Discharge Living Setting: [place] at Discharge: House Discharge [place]: One level Discharge Home Access: [place] entrance Discharge Bathroom Shower/Tub: Walk-in shower Discharge [place]: Handicapped height Discharge Bathroom Accessibility: Yes How Accessible: Accessible via walker, Accessible via wheelchair Does the patient have any problems obtaining your medications?: No   Social/Family/Support Systems Patient Roles: Other (Comment), Parent Contact Information: Mother Franchot Gallo [phone] to be her primary caregiver. Her aunt and uncle will also be in the home, though they have disabilities. Son, Karlton Lemon, is the POA and decision maker but does not plan to provide physical care for  Pt. Ability/Limitations of Caregiver: Caregivers can provide 24/7 supervision to min A Caregiver Availability: 24/7 Discharge Plan Discussed with Primary Caregiver: Yes Is Caregiver In Agreement with Plan?: Yes Does Caregiver/Family have Issues with Lodging/Transportation while Pt is in Rehab?: No   Goals Patient/Family Goal for Rehab: PT/OT/ Supervision to mod I Expected length of stay: 7-10 days Pt/Family Agrees to Admission and willing to participate: Yes Program Orientation Provided & Reviewed with Pt/Caregiver Including Roles  & Responsibilities: No   Decrease burden of Care through IP rehab admission: Othern/a    Possible need for SNF placement upon discharge: none    Patient Condition: I have reviewed medical records from [facility], spoken with CM, and patient, son, and family member. I met with patient at the bedside and discussed via phone for inpatient rehabilitation assessment.  Patient will benefit from ongoing PT and OT, can actively participate in 3 hours of therapy a day 5 days of the week, and can make measurable gains during the admission.  Patient will also benefit from the coordinated team approach during an Inpatient Acute Rehabilitation admission.  The patient will receive intensive therapy as well as Rehabilitation physician, nursing, social worker, and care management interventions.  Due to bladder management, bowel management, safety, skin/wound care, disease management, medication administration, pain management, and patient education the patient requires 24 hour a day rehabilitation nursing.  The patient is currently min assist with mobility and basic ADLs.  Discharge setting and therapy post discharge at home with home health is anticipated.  Patient has agreed to participate in the Acute Inpatient Rehabilitation Program and will admit today.   Preadmission Screen Completed By:  Retta Diones, 02/15/2023 3:13  PM ______________________________________________________________________   Discussed status with Dr. Naaman Plummer on 02/15/23 at 0945 and received approval for admission today.   Admission Coordinator:  Retta Diones, RN, time 1513/Date 02/15/23    Assessment/Plan: Diagnosis: Does the need for close, 24 hr/day Medical supervision in concert with the patient's rehab needs make it unreasonable for this patient to be served in a less intensive setting? Yes Co-Morbidities requiring supervision/potential complications: Meningioma s/p resection and L craniotomy, c/b seizure, headaches, vision difficulty, leukocytosis, ABLA, nausea, and  constipation Due to bladder management, bowel management, safety, skin/wound care, disease management, medication administration, pain management, and patient education, does the patient require 24 hr/day rehab nursing? Yes Does the patient require coordinated care of a physician, rehab nurse, PT, OT address physical and functional deficits in the context of the above medical diagnosis(es)? Yes Addressing deficits in the following areas: balance, endurance, locomotion, strength, transferring, bathing, dressing, feeding, grooming, and toileting Can the patient actively participate in an intensive therapy program of at least 3 hrs of therapy 5 days a week? Yes The potential for patient to make measurable gains while on inpatient rehab is excellent Anticipated functional outcomes upon discharge from inpatient rehab: supervision PT, supervision OT,  Estimated rehab length of stay to reach the above functional goals is: 7-10 days Anticipated discharge destination: Home 10. Overall Rehab/Functional Prognosis: good     MD Signature:   Gertie Gowda, DO 02/15/2023          Revision History

## 2023-02-15 NOTE — H&P (Incomplete)
Physical Medicine and Rehabilitation Admission H&P    CC: Functional deficits secondary to left craniotomy secondary to meningioma  HPI: Evelyn [name] is a 52 year old female with new onset seizures with left- sided visual loss. Started on Keppra. 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 She underwent left craniotomy and tumor resection by Dr Zada Finders on 02/08/2023. Moderate headache. Vision improved. Keppra continued. Started on heparin Gideon for VTE prophy. Expected left periorbital edema. Patient  requiring min guard assist with cues for transfers and safety and min assist for ambulation. Final path is grade 2 meningioma. Headaches improve with Toradol. The patient requires inpatient physical medicine and rehabilitation evaluations and treatment secondary to dysfunction due to large meningioma.  Main complaints are decrease vision acuity of left eye due to swelling and myopia as well as constipation. Having nausea and dizziness due ti hydrocodone>>>have discontinued. Currently getting toradol for h/a. Review of Systems  Constitutional:  Negative for chills and fever.  HENT:  Negative for nosebleeds and sore throat.   Respiratory:  Positive for cough. Negative for sputum production.   Cardiovascular:  Negative for chest pain and palpitations.  Gastrointestinal:  Positive for constipation and nausea.  Genitourinary:  Negative for dysuria and urgency.  Musculoskeletal:  Negative for back pain and neck pain.  Neurological:  Positive for dizziness and headaches.  Psychiatric/Behavioral:  Positive for depression. The patient has insomnia.    Past Medical History:  Diagnosis Date   Anemia    Anxiety    Depression    GERD (gastroesophageal reflux disease)    Headache    Obesity    Seizures ([place])    Sleep apnea    Past Surgical History:  Procedure Laterality Date   ABDOMINAL HYSTERECTOMY     APPLICATION OF CRANIAL NAVIGATION N/A 02/08/2023   Procedure: APPLICATION OF CRANIAL  NAVIGATION;  Surgeon: Judith Part, MD;  Location: [place];  Service: Neurosurgery;  Laterality: N/A;   BREAST LUMPECTOMY Left 1997   CHOLECYSTECTOMY     CRANIOTOMY Left 02/08/2023   Procedure: Left craniotomy for tumor resection with stealth navigation;  Surgeon: Judith Part, MD;  Location: [place];  Service: Neurosurgery;  Laterality: Left;   GASTRIC BYPASS  03/2017   THROAT SURGERY     removed scar tissue from throat d/t being intubated.    TUBAL LIGATION     Family History  Problem Relation Age of Onset   Breast cancer Mother 87   Breast cancer Paternal Aunt    Breast cancer Paternal Grandmother    Social History:  reports that she has never smoked. She has never used smokeless tobacco. She reports that she does not drink alcohol and does not use drugs. Allergies:  Allergies  Allergen Reactions   Dilaudid [Hydromorphone Hcl] Nausea And Vomiting   Morphine And Related Other (See Comments)    Fever and chills and convulsions   Vicodin [Hydrocodone-Acetaminophen] Nausea And Vomiting   Medications Prior to Admission  Medication Sig Dispense Refill   dexamethasone (DECADRON) 2 MG tablet Take 1 tablet (2 mg total) by mouth daily. 30 tablet 0   DULoxetine (CYMBALTA) 60 MG capsule Take 60 mg by mouth daily.     levETIRAcetam (KEPPRA) 750 MG tablet Take 1 tablet (750 mg total) by mouth 2 (two) times daily. 120 tablet 11   Vitamin D, Ergocalciferol, (DRISDOL) 1.25 MG (50000 UNIT) CAPS capsule Take 50,000 Units by mouth once a week.        Home: Home Living Family/patient  expects to be discharged to:: Private residence Living Arrangements: Children Available Help at Discharge: Family Type of Home: House Home Access: Door: One level Bathroom Shower/Tub: Multimedia programmer: Handicapped height Octa: Civil engineer, contracting, Grab bars - tub/shower, Hand held shower head Additional Comments: plans to stay with uncle and aunt and mother at  discharge  Lives With: Family   Functional History: Prior Function Prior Level of Function : Independent/Modified Independent Mobility Comments: Did a lot of church work including music; 6 y/o daughter, normally lives with daughter only  Functional Status:  Mobility: Bed Mobility Overal bed mobility: Modified Independent Bed Mobility: Supine to Sit, Sit to Supine Supine to sit: Modified independent (Device/Increase time), HOB elevated Sit to supine: Modified independent (Device/Increase time), HOB elevated General bed mobility comments: HOB elevated due to continued edema head/face; up in bathroom on arrival Transfers Overall transfer level: Needs assistance Equipment used: Rolling walker (2 wheels) Transfers: Sit to/from Stand Sit to Stand: Supervision Bed to/from chair/wheelchair/BSC transfer type:: Step pivot Step pivot transfers: Min guard General transfer comment: No VC required for hand placement. Pt places both hands on RW although utilizes her legs to stand and does not pull on RW. Ambulation/Gait Ambulation/Gait assistance: Min assist Gait Distance (Feet): 150 Feet Assistive device: Rolling walker (2 wheels) Gait Pattern/deviations: Decreased stride length, Step-through pattern, Drifts right/left, Narrow base of support General Gait Details: pt able to see well enough to steer RW around obstacles, however tends to drift to her right when walking straight forward or turning right; two instances of imbalance requiring use of her UEs via RW to self-correct Gait velocity: decr more than usual due to nausea    ADL: ADL Overall ADL's : Needs assistance/impaired Grooming: Wash/dry hands, Supervision/safety, Standing Lower Body Dressing: Set up Lower Body Dressing Details (indicate cue type and reason): Sitting on EOB, pt was able to don [employer] Transfer: Min guard, BSC/3in1, Rolling walker (2 wheels) Toilet Transfer Details (indicate cue type and reason): BSC over  toilet Toileting- Clothing Manipulation and Hygiene: Sitting/lateral lean, Supervision/safety General ADL Comments: Based on clinical judgement pt will require at least mod-max assist to complete BADL tasks.  Cognition: Cognition Overall Cognitive Status: Within Functional Limits for tasks assessed Orientation Level: Oriented X4 Cognition Arousal/Alertness: Awake/alert Behavior During Therapy: Flat affect Overall Cognitive Status: Within Functional Limits for tasks assessed Area of Impairment: Problem solving Memory: Decreased short-term memory (pt reports; some word-finding issues) Safety/Judgement: Decreased awareness of safety, Decreased awareness of deficits Problem Solving: Requires verbal cues, Slow processing General Comments: A&O x4, following instructions and no signs of impulsivity; requires verbal cues  Physical Exam: Blood pressure (!) 111/57, pulse 78, temperature 99 F (37.2 C), resp. rate 18, height 5' 7"$  (1.702 m), weight 133.8 kg, SpO2 96 %. Physical Exam Constitutional:      General: She is not in acute distress. HENT:     Head:     Comments: Incision healing nicely, left peri-orbital edema Cardiovascular:     Rate and Rhythm: Regular rhythm.  Pulmonary:     Effort: Pulmonary effort is normal.     Breath sounds: Normal breath sounds.  Abdominal:     General: Bowel sounds are normal.     Palpations: Abdomen is soft.  Neurological:     General: No focal deficit present.     Mental Status: She is alert and oriented to person, place, and time.  Psychiatric:        Mood and Affect: Mood normal.  Behavior: Behavior normal.     No results found for this or any previous visit (from the past 48 hour(s)). No results found.    Blood pressure (!) 111/57, pulse 78, temperature 99 F (37.2 C), resp. rate 18, height 5' 7"$  (1.702 m), weight 133.8 kg, SpO2 96 %.  Medical Problem List and Plan: 1. Functional deficits secondary to large left craniotomy s/p  craniotomy  -patient may *** shower  -ELOS/Goals: ***  2.  Antithrombotics: -DVT/anticoagulation:  Pharmaceutical: Heparin  -antiplatelet therapy: none  3. Pain Management: Tylenol, Norco as needed  4. Mood/Behavior/Sleep: LCSW to evaluate and provide emotional support  -continue Cymbalta 60 mg daily (depressive disorder)  -antipsychotic agents: n/a  5. Neuropsych/cognition: This patient is capable of making decisions on his own behalf.  6. Skin/Wound Care: Routine skin care checks   7. 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

## 2023-02-15 NOTE — Progress Notes (Addendum)
Inpatient Rehabilitation Admission Medication Review by a Pharmacist  A complete drug regimen review was completed for this patient to identify any potential clinically significant medication issues.  High Risk Drug Classes Is patient taking? Indication by Medication  Antipsychotic Yes Compazine prn N/V  Anticoagulant Yes Sq heparin - VTE ppx  Antibiotic No   Opioid No   Antiplatelet No   Hypoglycemics/insulin No   Vasoactive Medication No   Chemotherapy No   Other Yes Cymbalta - mood Famotidine - reflux Keppra - seizure ppx Zofran prn - N/V Ketorolac IV - pain Robaxin - spasms Trazodone - sleep     Type of Medication Issue Identified Description of Issue Recommendation(s)  Drug Interaction(s) (clinically significant)     Duplicate Therapy     Allergy     No Medication Administration End Date     Incorrect Dose     Additional Drug Therapy Needed     Significant med changes from prior encounter (inform family/care partners about these prior to discharge).    Other       Clinically significant medication issues were identified that warrant physician communication and completion of prescribed/recommended actions by midnight of the next day:  No  Pharmacist comments: None  Time spent performing this drug regimen review (minutes): 20 minutes  Thank you Anette Guarneri, PharmD

## 2023-02-15 NOTE — Progress Notes (Signed)
Physical Therapy Treatment Patient Details Name: Evelyn [name] MRN: [id number] DOB: Feb 05, 1971 Today's Date: 02/15/2023   History of Present Illness Pt is a 52 y.o. 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 Pt admitted for intervention and is s/p left craniotomy for tumor resection 2/16. PMH: GERD, anxiety, depression, seizures, sleep apnea, obesity    PT Comments    Patient reported headache, but willing to work with PT. Bed mobility and transfers continue to improve, however gait remains unsteady with drift to her right and imbalance when turning. Reports her legs feel "wobbly" and appears to have decr control when advancing either LE.     Recommendations for follow up therapy are one component of a multi-disciplinary discharge planning process, led by the attending physician.  Recommendations may be updated based on patient status, additional functional criteria and insurance authorization.  Follow Up Recommendations  Acute inpatient rehab (3hours/day)     Assistance Recommended at Discharge Frequent or constant Supervision/Assistance  Patient can return home with the following A little help with walking and/or transfers;Assist for transportation;Help with stairs or ramp for entrance;A little help with bathing/dressing/bathroom   Equipment Recommendations  Rolling walker (2 wheels);BSC/3in1    Recommendations for Other Services       Precautions / Restrictions Precautions Precautions: Fall Precaution Comments: decr vision due to OS swollen shut and right OD blurry Restrictions Weight Bearing Restrictions: No     Mobility  Bed Mobility Overal bed mobility: Modified Independent Bed Mobility: Supine to Sit, Sit to Supine     Supine to sit: Modified independent (Device/Increase time), HOB elevated Sit to supine: Modified  independent (Device/Increase time), HOB elevated   General bed mobility comments: HOB elevated due to continued edema head/face    Transfers Overall transfer level: Needs assistance Equipment used: Rolling walker (2 wheels) Transfers: Sit to/from Stand Sit to Stand: Supervision           General transfer comment: Pt places both hands on RW although utilizes her legs to stand and does not pull on RW.    Ambulation/Gait Ambulation/Gait assistance: Min assist Gait Distance (Feet): 200 Feet Assistive device: Rolling walker (2 wheels) Gait Pattern/deviations: Decreased stride length, Step-through pattern, Drifts right/left, Narrow base of support       General Gait Details: pt able to see well enough to steer RW around obstacles, however tends to drift to her right when walking straight forward or turning right; one instance of imbalance requiring use of her UEs via RW to self-correct; reports legs still feel unsteady   Stairs             Wheelchair Mobility    Modified Rankin (Stroke Patients Only)       Balance Overall balance assessment: Needs assistance Sitting-balance support: Feet supported Sitting balance-Leahy Scale: Good Sitting balance - Comments: no LOB on EOB even with eyes closed   Standing balance support: Single extremity supported, No upper extremity supported Standing balance-Leahy Scale: Fair                              Cognition Arousal/Alertness: Awake/alert Behavior During Therapy: Flat affect Overall Cognitive Status: Within Functional Limits for tasks assessed  Exercises      General Comments General comments (skin integrity, edema, etc.): Mother present. Ice pack provided for left head/face      Pertinent Vitals/Pain Pain Assessment Pain Assessment: Faces Faces Pain Scale: Hurts even more Pain Location: headache Pain Descriptors / Indicators: Headache     Home Living                          Prior Function            PT Goals (current goals can now be found in the care plan section) Acute Rehab PT Goals Patient Stated Goal: to return to independent Time For Goal Achievement: 02/23/23 Potential to Achieve Goals: Good Progress towards PT goals: Progressing toward goals    Frequency    Min 4X/week      PT Plan Current plan remains appropriate    Co-evaluation              AM-PAC PT "6 Clicks" Mobility   Outcome Measure  Help needed turning from your back to your side while in a flat bed without using bedrails?: None Help needed moving from lying on your back to sitting on the side of a flat bed without using bedrails?: None Help needed moving to and from a bed to a chair (including a wheelchair)?: A Little Help needed standing up from a chair using your arms (e.g., wheelchair or bedside chair)?: A Little Help needed to walk in hospital room?: A Little Help needed climbing 3-5 steps with a railing? : A Lot 6 Click Score: 19    End of Session Equipment Utilized During Treatment: Gait belt Activity Tolerance: Patient tolerated treatment well Patient left: with call bell/phone within reach;with family/visitor present;in bed Nurse Communication: Mobility status;Patient requests pain meds PT Visit Diagnosis: Other abnormalities of gait and mobility (R26.89);Muscle weakness (generalized) (M62.81);Pain Pain - part of body:  (head)     Time: [id number] PT Time Calculation (min) (ACUTE ONLY): 21 min  Charges:  $Gait Training: 8-22 mins                      [place]  Office [phone]    Rexanne Mano 02/15/2023, 4:33 PM

## 2023-02-15 NOTE — Progress Notes (Signed)
IP rehab admissions - We have received auth for acute inpatient rehab admission from insurance carrier for today.  Bed available and patient has been cleared for inpatient rehab admission by attending MD.  Will admit to CIR today.  Call for questions.  [phone]

## 2023-02-15 NOTE — TOC Transition Note (Signed)
Transition of Care (TOC) - CM/SW Discharge Note Marvetta Gibbons RN,BSN Transitions of Care Unit 4NP (Non Trauma)- RN Case Manager See Treatment Team for direct Phone #   Patient Details  Name: Evelyn [name] MRN: [id number] Date of Birth: [birth year]  Transition of Care [facility]) CM/SW Contact:  Dawayne Patricia, RN Phone Number: 02/15/2023, 3:09 PM   Clinical Narrative:    Per INPT rehab liaison pt has received insurance auth for rehab stay and [facility] INPT rehab has bed to offer for admit today.  Attending has cleared pt for transition to [place] rehab.   CM spoke with son Evelyn [name] to update and inform him that pt has received auth and has bed for INPT rehab- pt will admit today for INPT rehab stay. Son voiced that he is happy that rehab was approved. Explained that rehab team would be following for transition needs post rehab including DME and HH needs. Evelyn [name] voiced understanding and appreciation.   [place] referral was accepted by [facility] for HHPT/OT- notified liaison Amy that pt would transition to [place] rehab so they can follow for [facility] needs.   No further TOC needs at this time.    Final next level of care: IP Rehab Facility Barriers to Discharge: Barriers Resolved   Patient Goals and CMS Choice CMS Medicare.gov Compare Post Acute Care list provided to:: Patient Represenative (must comment) Choice offered to / list presented to : Adult Children, Patient, Parent  Discharge Placement                 [facility] IN[facility] re[facility]        Discharge Plan and Services Additional resources added to the After Visit Summary for     Discharge Planning Services: CM Consult, Mobile Meals Post Acute Care Choice: Durable Medical Equipment, Home Health          DME Arranged: N/A DME Agency: NA       HH Arranged: PT, OT HH Agency: [place] Date [place]: 02/13/23 Time HH Agency Contacted: 61 Representative spoke with at [place]: Amy  Social Determinants of Health  ([place]) Interventions SDOH Screenings   Tobacco Use: Low Risk  (02/15/2023)     Readmission Risk Interventions    02/15/2023    3:09 PM  Readmission Risk Prevention Plan  Medication Screening Complete  Transportation Screening Complete

## 2023-02-15 NOTE — Progress Notes (Signed)
Occupational Therapy Treatment Patient Details Name: Evelyn [name] MRN: [id number] DOB: [birth year] Today's Date: 02/15/2023   History of present illness Pt is a 52 y.o. 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 Pt admitted for intervention and is s/p left craniotomy for tumor resection 2/16. PMH: GERD, anxiety, depression, seizures, sleep apnea, obesity   OT comments  Pt agreeable to participate in OT treatment session. Pt reports that her balance is her biggest deficits. Reports issues during static standing versus dynamic standing. Session focused on static standing while completing functional reaching task in hallway. No LOB noted. Pt did not require any seated rest breaks. Making steady progress towards therapy goals overall. Pt reports that they are waiting for approval to go to CIR.    Recommendations for follow up therapy are one component of a multi-disciplinary discharge planning process, led by the attending physician.  Recommendations may be updated based on patient status, additional functional criteria and insurance authorization.    Follow Up Recommendations  Acute inpatient rehab (3hours/day)     Assistance Recommended at Discharge Intermittent Supervision/Assistance  Patient can return home with the following  A little help with walking and/or transfers;Assistance with cooking/housework;Assist for transportation;A little help with bathing/dressing/bathroom;Help with stairs or ramp for entrance   Equipment Recommendations  Other (comment) (defer to next venue of care)    Recommendations for Other Services      Precautions / Restrictions Precautions Precautions: Fall Precaution Comments: decr vision due to OS swollen shut and right OD blurry Restrictions Weight Bearing Restrictions: No       Mobility Bed Mobility Overal  bed mobility: Modified Independent Bed Mobility: Supine to Sit, Sit to Supine     Supine to sit: Modified independent (Device/Increase time), HOB elevated Sit to supine: Modified independent (Device/Increase time), HOB elevated     Patient Response: Flat affect, Cooperative  Transfers Overall transfer level: Needs assistance Equipment used: Rolling walker (2 wheels) Transfers: Sit to/from Stand Sit to Stand: Supervision           General transfer comment: No VC required for hand placement. Pt places both hands on RW although utilizes her legs to stand and does not pull on RW.     Balance Overall balance assessment: Needs assistance Sitting-balance support: Feet supported Sitting balance-Leahy Scale: Good Sitting balance - Comments: no LOB on EOB even with eyes closed   Standing balance support: Single extremity supported, No upper extremity supported Standing balance-Leahy Scale: Fair Standing balance comment: pt completed static standing balance task while placing/removing [place] from vertical surface using either right or left hand. Able to complete without LOB.                           ADL either performed or assessed with clinical judgement      Cognition Arousal/Alertness: Awake/alert Behavior During Therapy: Flat affect Overall Cognitive Status: Within Functional Limits for tasks assessed                         Pertinent Vitals/ Pain       Pain Assessment Pain Assessment: Faces Faces Pain Scale: Hurts a little bit Pain Location: headache Pain Descriptors / Indicators: Headache Pain Intervention(s): Monitored during session, Premedicated before session         Frequency  Min 2X/week  Progress Toward Goals  OT Goals(current goals can now be found in the care plan section)  Progress towards OT goals: Progressing toward goals     Plan Frequency remains appropriate;Discharge plan needs to be updated     Co-evaluation                 AM-PAC OT "6 Clicks" Daily Activity     Outcome Measure   Help from another person eating meals?: None Help from another person taking care of personal grooming?: A Little Help from another person toileting, which includes using toliet, bedpan, or urinal?: A Little Help from another person bathing (including washing, rinsing, drying)?: A Little Help from another person to put on and taking off regular upper body clothing?: A Little Help from another person to put on and taking off regular lower body clothing?: A Little 6 Click Score: 19    End of Session Equipment Utilized During Treatment: Gait belt;Rolling walker (2 wheels)  OT Visit Diagnosis: Muscle weakness (generalized) (M62.81);Unsteadiness on feet (R26.81)   Activity Tolerance Patient tolerated treatment well   Patient Left in bed;with call bell/phone within reach;with family/visitor present           Time: [id number] OT Time Calculation (min): 21 min  Charges: OT General Charges $OT Visit: 1 Visit OT Treatments $Therapeutic Activity: 8-22 mins  Ailene Ravel, OTR/L,CBIS  Supplemental OT - [facility] and [facility] Secure Chat Preferred    Chesney Klimaszewski, Clarene Duke 02/15/2023, 10:46 AM

## 2023-02-15 NOTE — Progress Notes (Signed)
INPATIENT REHABILITATION ADMISSION NOTE   Arrival Method:wheelchair     Mental Orientation:alert   Assessment:done   Skin: Done   IV'S: RT  arm  Pain: 7/10 ; headache  Tubes and Drains:none   Safety Measures:done   Vital Signs:done   Height and Weight: done  Rehab Orientation:done   Family:mother in room  Glenis Smoker BSN, RN-BC, WTA  Notes:

## 2023-02-15 NOTE — Progress Notes (Signed)
Neurosurgery Service Progress Note  Subjective: No acute events overnight. Headaches are improved. Left eye swelling is improving as well. No voiced complaints.    Objective: Vitals:   02/14/23 2003 02/14/23 2314 02/15/23 0315 02/15/23 0751  BP: (!) 142/74 128/78 118/69 114/63  Pulse: 77 78 71 72  Resp: 20 20 20 18  $ Temp: 98.7 F (37.1 C) 99.1 F (37.3 C) 99.3 F (37.4 C) 99.1 F (37.3 C)  TempSrc: Oral Oral Oral Oral  SpO2: 97% 90% 96% 91%  Weight:      Height:        Physical Exam: Awake/alert, OD swollen shut, OS full ROM and normal VA with normal pupillary diameter / reactivity, Ox3, FS & SS, incision c/d/I, strength 5/5x4, no drift, speech fluent with normal content   Assessment & Plan: 52 y.o. woman w/ seizures s/p L pterional for large meningioma, recovering well.   -cont home keppra -final path grade 2 meningioma -cont PT/OT -CIR approval pending  Norm Parcel, PA-C 02/15/23 11:00 AM

## 2023-02-16 LAB — COMPREHENSIVE METABOLIC PANEL
ALT: 21 U/L (ref 0–44)
AST: 30 U/L (ref 15–41)
Albumin: 2.5 g/dL — ABNORMAL LOW (ref 3.5–5.0)
Alkaline Phosphatase: 55 U/L (ref 38–126)
Anion gap: 14 (ref 5–15)
BUN: 7 mg/dL (ref 6–20)
CO2: 19 mmol/L — ABNORMAL LOW (ref 22–32)
Calcium: 8.5 mg/dL — ABNORMAL LOW (ref 8.9–10.3)
Chloride: 101 mmol/L (ref 98–111)
Creatinine, Ser: 1.1 mg/dL — ABNORMAL HIGH (ref 0.44–1.00)
GFR, Estimated: >60 mL/min (ref >60)
Glucose, Bld: 155 mg/dL — ABNORMAL HIGH (ref 70–99)
Potassium: 3.5 mmol/L (ref 3.5–5.1)
Sodium: 134 mmol/L — ABNORMAL LOW (ref 135–145)
Total Bilirubin: 0.3 mg/dL (ref 0.3–1.2)
Total Protein: 6.3 g/dL — ABNORMAL LOW (ref 6.5–8.1)

## 2023-02-16 LAB — CBC WITH DIFFERENTIAL/PLATELET
Abs Immature Granulocytes: 0.05 10*3/uL (ref 0.00–0.07)
Basophils Absolute: 0 10*3/uL (ref 0.0–0.1)
Basophils Relative: 0 %
Eosinophils Absolute: 0.2 10*3/uL (ref 0.0–0.5)
Eosinophils Relative: 3 %
HCT: 28.1 % — ABNORMAL LOW (ref 36.0–46.0)
Hemoglobin: 8.7 g/dL — ABNORMAL LOW (ref 12.0–15.0)
Immature Granulocytes: 1 %
Lymphocytes Relative: 21 %
Lymphs Abs: 1.3 10*3/uL (ref 0.7–4.0)
MCH: 26.2 pg (ref 26.0–34.0)
MCHC: 31 g/dL (ref 30.0–36.0)
MCV: 84.6 fL (ref 80.0–100.0)
Monocytes Absolute: 0.5 10*3/uL (ref 0.1–1.0)
Monocytes Relative: 7 %
Neutro Abs: 4.4 10*3/uL (ref 1.7–7.7)
Neutrophils Relative %: 68 %
Platelets: 332 10*3/uL (ref 150–400)
RBC: 3.32 MIL/uL — ABNORMAL LOW (ref 3.87–5.11)
RDW: 14.2 % (ref 11.5–15.5)
WBC: 6.5 10*3/uL (ref 4.0–10.5)
nRBC: 0 % (ref 0.0–0.2)

## 2023-02-16 LAB — HEMOGLOBIN AND HEMATOCRIT, BLOOD
HCT: 27 % — ABNORMAL LOW (ref 36.0–46.0)
Hemoglobin: 8.6 g/dL — ABNORMAL LOW (ref 12.0–15.0)

## 2023-02-16 MED ORDER — SODIUM CHLORIDE 0.9 % IV SOLN: INTRAVENOUS | Status: AC

## 2023-02-16 MED ORDER — ACETAMINOPHEN 325 MG PO TABS: 650.0000 mg | ORAL_TABLET | Freq: Four times a day (QID) | ORAL | Status: DC | PRN

## 2023-02-16 MED ORDER — GABAPENTIN 100 MG PO CAPS
200.0000 mg | ORAL_CAPSULE | Freq: Three times a day (TID) | ORAL | Status: DC
  Administered 2023-02-16 – 2023-02-17 (×2): 200 mg via ORAL
  Filled 2023-02-16 (×2): qty 2

## 2023-02-16 MED ORDER — ACETAMINOPHEN 325 MG PO TABS
650.0000 mg | ORAL_TABLET | Freq: Three times a day (TID) | ORAL | Status: DC
  Administered 2023-02-16 – 2023-02-26 (×29): 650 mg via ORAL
  Filled 2023-02-16 (×31): qty 2

## 2023-02-16 MED ORDER — BUTALBITAL-APAP-CAFFEINE 50-325-40 MG PO TABS
1.0000 | ORAL_TABLET | Freq: Four times a day (QID) | ORAL | Status: DC | PRN
  Administered 2023-02-16 – 2023-02-25 (×16): 1 via ORAL
  Filled 2023-02-16 (×16): qty 1

## 2023-02-16 MED ORDER — TRAMADOL HCL 50 MG PO TABS
50.0000 mg | ORAL_TABLET | Freq: Four times a day (QID) | ORAL | Status: DC | PRN
  Administered 2023-02-16 – 2023-02-18 (×4): 50 mg via ORAL
  Filled 2023-02-16 (×4): qty 1

## 2023-02-16 NOTE — Progress Notes (Signed)
Occupational Therapy Note  Patient Details  Name: Evelyn [name] MRN: [id number] Date of Birth: [birth year]  Today's Date: 02/16/2023 OT Missed Time: 89 Minutes Missed Time Reason: Patient ill (comment)  Pt was not feeling well at either attempt- pt with headache and nausea and not feeling well. Pt unable to perform the OT eval- scheduled forthefollowing day.  Missed 60 min      Willeen Cass [facility] 02/16/2023, 3:03 PM

## 2023-02-16 NOTE — Progress Notes (Signed)
Inpatient Rehabilitation  Patient information reviewed and entered into eRehab system by Nubia Ziesmer M. Kaylianna Detert, M.A., CCC/SLP, PPS Coordinator.  Information including medical coding, functional ability and quality indicators will be reviewed and updated through discharge.    

## 2023-02-16 NOTE — Progress Notes (Signed)
Met with patient, mother at bedside. Son coming today to stay. Encouraged her to get some rest. Reports that patient's father is on 2W, that passed out when visiting patient. Reports that patient ;will be staying with Uncle.once discharged. He is bilateral amputee and home is completely handicapped accessible inside.  Oriented to rehab, team meeting every Tuesday with SW following up. Discussed education. Reports that patient had really good BM yesterday.  Reports that patient waits to take pain medication until it gets so bad that pain doesn't go away.  All needs met, call bell in reach

## 2023-02-16 NOTE — Progress Notes (Signed)
PROGRESS NOTE   Subjective/Complaints:  No events overnight.  This a.m., patient was up with therapies when during the session, while navigating a flight of stairs, she suddenly felt nauseous and weak.  Vitals significant for blood pressure 98/77, on return to room and lying down 125/88.  She complains of being cold, clammy, and shivering.  Has severe 8 out of 10 headache, has not gotten any medication for it at time of assessment.  Vitals on recheck were stable, labs this a.m. significant for mild AKI and hyponatremia.  ROS: + Chills, positive nausea, positive headache.  No abdominal pain, constipation, diarrhea, SOB, cough, chest pain, new weakness or paraesthesias.    Objective:   No results found. Recent Labs    02/16/23 0801  WBC 6.5  HGB 8.7*  HCT 28.1*  PLT 332   Recent Labs    02/16/23 0801  NA 134*  K 3.5  CL 101  CO2 19*  GLUCOSE 155*  BUN 7  CREATININE 1.10*  CALCIUM 8.5*    Intake/Output Summary (Last 24 hours) at 02/16/2023 1236 Last data filed at 02/16/2023 0853 Gross per 24 hour  Intake 240 ml  Output --  Net 240 ml        Physical Exam: Vital Signs Blood pressure 131/74, pulse 86, temperature 99.7 F (37.6 C), temperature source Oral, resp. rate 18, height '5\' 7"'$  (1.702 m), weight 130.7 kg, SpO2 98 %.  Physical Exam Constitutional: Mild distress, in bed under multiple covers, shivering. Appropriate appearance for age. +Obese HENT: No JVD. Neck Supple. Trachea midline. + Left craniotomy, with stable edema from prior exam Eyes: PERRLA. EOMI. +Discomfort with superior gaze both monocular and binocular  -improved Cardiovascular: RRR, no murmurs/rub/gallops.  Peripheral pulses 2+  Respiratory: CTAB. No rales, rhonchi, or wheezing. On RA.  Abdomen: + bowel sounds, normoactive. No distention or tenderness.  Skin: + L craniotomy site with absorbable sutures, site c/d/I, significant localized  swelling MSK:      No apparent deformity.      Strength: Moving all 4 limbs antigravity and against resistance.  Neurologic exam:  Cognition: AAO to person, place, time and event.  No new deficits appreciated from prior exam. Language: Fluent, No substitutions or neoglisms. No dysarthria.  Insight: Good insight into current condition.  Mood: Pleasant affect, appropriate mood.  CN: + L V1 sensory deficit -improved;  + L ptosis (? C/b periorbital edema)-able to open left eye fully during exam, primarily limited by edema. Coordination: No apparent tremors. + Mild LUE ataxia Spasticity: MAS 0 in all extremities.      Assessment/Plan: 1. Functional deficits which require 3+ hours per day of interdisciplinary therapy in a comprehensive inpatient rehab setting. Physiatrist is providing close team supervision and 24 hour management of active medical problems listed below. Physiatrist and rehab team continue to assess barriers to discharge/monitor patient progress toward functional and medical goals  Care Tool:  Bathing              Bathing assist       Upper Body Dressing/Undressing Upper body dressing        Upper body assist      Lower Body Dressing/Undressing  Lower body dressing            Lower body assist       Toileting Toileting    Toileting assist       Transfers Chair/bed transfer  Transfers assist     Chair/bed transfer assist level: Contact Guard/Touching assist     Locomotion Ambulation   Ambulation assist      Assist level: Contact Guard/Touching assist Assistive device: Walker-rolling Max distance: 175f   Walk 10 feet activity   Assist     Assist level: Contact Guard/Touching assist Assistive device: Walker-rolling   Walk 50 feet activity   Assist    Assist level: Contact Guard/Touching assist Assistive device: Walker-rolling    Walk 150 feet activity   Assist    Assist level: Contact Guard/Touching  assist Assistive device: Walker-rolling    Walk 10 feet on uneven surface  activity   Assist Walk 10 feet on uneven surfaces activity did not occur: Safety/medical concerns         Wheelchair     Assist Is the patient using a wheelchair?: No             Wheelchair 50 feet with 2 turns activity    Assist            Wheelchair 150 feet activity     Assist          Blood pressure 131/74, pulse 86, temperature 99.7 F (37.6 C), temperature source Oral, resp. rate 18, height '5\' 7"'$  (1.702 m), weight 130.7 kg, SpO2 98 %.  Medical Problem List and Plan: 1. Functional deficits secondary to meningioma s/p craniotomy             -patient may shower             -ELOS/Goals: 7-10 days, supervision PT/OT   2.  Antithrombotics: -DVT/anticoagulation:  Pharmaceutical: Heparin             -antiplatelet therapy: none   3. Pain Management:  Ongoing post-op headache             - Can continue IV toradol for post-op Headache through admission overnight; transition to oral regimen in AM             - Patient endorses nausea/allergy to Vicodin/Norco/Dilaudid - seizures with Morphine - DC and add Gabapentin 300 mg PRN Q8H for pain, HA             - Per chart review ? Allergy to tylenol  - 2/23: Reviewed medications the patient has been able to tolerate, versus recorded allergies.  Adjusted med regimen to gabapentin 200 mg 3 times daily, Tylenol 650 mg scheduled 3 times daily, with as needed Fioricet for headaches, Toradol for moderate pain, and tramadol for severe pain.   4. Mood/Behavior/Sleep: LCSW to evaluate and provide emotional support             -continue Cymbalta 60 mg daily (depressive disorder)             -antipsychotic agents: n/a   5. Neuropsych/cognition: This patient is capable of making decisions on his own behalf.   6. Skin/Wound Care: Routine skin care checks   7. Fluids/Electrolytes/Nutrition: Routine Is and Os and follow-up chemistries -Mild AKI  on admission labs with creatinine 1.1, BUN 7, along with hyponatremia 134 from 140.  Initiate IV fluids normal saline at 150 cc/h, 1 L total.  Will repeat labs in a.m.   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   11. Constipation. LBM PTA.              - Colace 100 mg BID + PRN Miralax inpatient; increase to BID miralax, Sennakot-S 2 tabs QHS             - PRN sorbitol, MOM, and Enema; encouraged patient to use a PRN tonight             -No recorded BM overnight, with ongoing nausea will schedule enema for this evening.  13.  Orthostatic hypotension, with presyncope - May be due to dehydration with AKI versus adrenal insufficiency.  Replete IV fluids as above, if no improvement this afternoon will order a.m. cortisol with a.m. labs and depending on result may initiate slower steroid taper.   LOS: 1 days A FACE TO FACE EVALUATION WAS PERFORMED  Gertie Gowda 02/16/2023, 12:36 PM

## 2023-02-16 NOTE — Discharge Instructions (Addendum)
Inpatient Rehab Discharge Instructions  Evelyn [name] Discharge date and time:    Activities/Precautions/ Functional Status: Activity: no lifting, driving, or strenuous exercise until cleared by MD Diet: regular diet Wound Care: keep wound clean and dry Functional status:  ___ No restrictions     ___ Walk up steps independently ___ 24/7 supervision/assistance   ___ Walk up steps with assistance __x_ Intermittent supervision/assistance  ___ Bathe/dress independently ___ Walk with walker     ___ Bathe/dress with assistance ___ Walk Independently    ___ Shower independently ___ Walk with assistance    _x__ Shower with assistance _x__ No alcohol     ___ Return to work/school ________ Special Instructions: No driving, alcohol consumption or tobacco use.    COMMUNITY REFERRALS UPON DISCHARGE:    Outpatient: PT     OT                 Agency: [facility]     Phone: [phone]             Appointment Date/Time: *Please expect follow-up within 7-10 business days to schedule your appointment. If you have not received follow-up, please follow-up with the site directly.*   GENERAL COMMUNITY RESOURCES FOR PATIENT/FAMILY:  Please contact [facility] [employer] [phone] if transportation services are needed to schedule medical appointments. Be sure to contact 3-5 days in advance to schedule your appointments.   My questions have been answered and I understand these instructions. I will adhere to these goals and the provided educational materials after my discharge from the hospital.  Patient/Caregiver Signature _______________________________ Date __________  Clinician Signature _______________________________________ Date __________  Please bring this form and your medication list with you to all your follow-up doctor's appointments.

## 2023-02-16 NOTE — Plan of Care (Signed)
  Problem: RH Balance Goal: LTG Patient will maintain dynamic standing balance (PT) Description: LTG:  Patient will maintain dynamic standing balance with assistance during mobility activities (PT) Flowsheets (Taken 02/16/2023 0913) LTG: Pt will maintain dynamic standing balance during mobility activities with:: Supervision/Verbal cueing   Problem: Sit to Stand Goal: LTG:  Patient will perform sit to stand with assistance level (PT) Description: LTG:  Patient will perform sit to stand with assistance level (PT) Flowsheets (Taken 02/16/2023 0913) LTG: PT will perform sit to stand in preparation for functional mobility with assistance level: Independent with assistive device   Problem: RH Bed Mobility Goal: LTG Patient will perform bed mobility with assist (PT) Description: LTG: Patient will perform bed mobility with assistance, with/without cues (PT). Flowsheets (Taken 02/16/2023 0913) LTG: Pt will perform bed mobility with assistance level of: Independent   Problem: RH Bed to Chair Transfers Goal: LTG Patient will perform bed/chair transfers w/assist (PT) Description: LTG: Patient will perform bed to chair transfers with assistance (PT). Flowsheets (Taken 02/16/2023 0913) LTG: Pt will perform Bed to Chair Transfers with assistance level: Independent with assistive device    Problem: RH Car Transfers Goal: LTG Patient will perform car transfers with assist (PT) Description: LTG: Patient will perform car transfers with assistance (PT). Flowsheets (Taken 02/16/2023 0913) LTG: Pt will perform car transfers with assist:: Supervision/Verbal cueing   Problem: RH Ambulation Goal: LTG Patient will ambulate in controlled environment (PT) Description: LTG: Patient will ambulate in a controlled environment, # of feet with assistance (PT). Flowsheets (Taken 02/16/2023 0913) LTG: Pt will ambulate in controlled environ  assist needed:: Supervision/Verbal cueing LTG: Ambulation distance in controlled  environment: 133f Goal: LTG Patient will ambulate in home environment (PT) Description: LTG: Patient will ambulate in home environment, # of feet with assistance (PT). Flowsheets (Taken 02/16/2023 0913) LTG: Pt will ambulate in home environ  assist needed:: Supervision/Verbal cueing LTG: Ambulation distance in home environment: 558f  Problem: RH Stairs Goal: LTG Patient will ambulate up and down stairs w/assist (PT) Description: LTG: Patient will ambulate up and down # of stairs with assistance (PT) Flowsheets (Taken 02/16/2023 0913) LTG: Pt will ambulate up/down stairs assist needed:: Supervision/Verbal cueing LTG: Pt will  ambulate up and down number of stairs: 12 for community mobility

## 2023-02-16 NOTE — Progress Notes (Addendum)
Inpatient Rehabilitation Care Coordinator Assessment and Plan Patient Details  Name: Evelyn [name] MRN: [id number] Date of Birth: 22-Jan-1971  Today's Date: 02/16/2023  Hospital Problems: Principal Problem:   Meningioma, cerebral [place])  Past Medical History:  Past Medical History:  Diagnosis Date   Anemia    Anxiety    Depression    GERD (gastroesophageal reflux disease)    Headache    Obesity    Seizures ([place])    Sleep apnea    Past Surgical History:  Past Surgical History:  Procedure Laterality Date   ABDOMINAL HYSTERECTOMY     APPLICATION OF CRANIAL NAVIGATION N/A 02/08/2023   Procedure: APPLICATION OF CRANIAL NAVIGATION;  Surgeon: Judith Part, MD;  Location: [facility];  Service: Neurosurgery;  Laterality: N/A;   BREAST LUMPECTOMY Left 1997   CHOLECYSTECTOMY     CRANIOTOMY Left 02/08/2023   Procedure: Left craniotomy for tumor resection with stealth navigation;  Surgeon: Judith Part, MD;  Location: [place];  Service: Neurosurgery;  Laterality: Left;   GASTRIC BYPASS  03/2017   THROAT SURGERY     removed scar tissue from throat d/t being intubated.    TUBAL LIGATION     Social History:  reports that she has never smoked. She has never used smokeless tobacco. She reports that she does not drink alcohol and does not use drugs.  Family / Support Systems Marital Status: Divorced How Long?: 15+ yrs ago Patient Roles: Parent Spouse/Significant Other: Divorced Children: 4 children- Evelyn [name]/ primary contact (oldest son; lives in [place]); Evelyn [name] 54 yr old goddaughter she has in her custody. Currently staying with her middle son. Other Supports: Mother and Christie Nottingham Anticipated Caregiver: Mother Franchot Gallo Ability/Limitations of Caregiver: None reported. Pt will discharge to her uncle Myron's home and her mother will still with her. Caregiver Availability: 24/7 Family Dynamics: Pt lives alone and care for her goddaughter Evelyn [name].  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 Education: some Medical sales representative - How often do you need to have someone help you when you read instructions, pamphlets, or other written material from your doctor or pharmacy?: Never Writes: Yes Employment Status: Employed Name of Employer: [employer] Length of Employment:  (3 years) Return to Work Plans: TBD. Currently receives unemployment and has filed for [employer]. Legal History/Current Legal Issues: Pt reports an incident 13-14 yrs ago; denies any current incidents. Guardian/Conservator: Chauncey Reading- son Karle Starch [phone] ); no form on file.   Abuse/Neglect Abuse/Neglect Assessment Can Be Completed: Yes Physical Abuse: Denies Verbal Abuse: Denies Sexual Abuse: Denies Exploitation of patient/patient's resources: Denies Self-Neglect: Denies  Patient response to: Social Isolation - How often do you feel lonely or isolated from those around you?: Never  Emotional Status Pt's affect, behavior and adjustment status: Pt was tired at the time of the encounter, and was sleepy. Recent Psychosocial Issues: Due to current situation. Psychiatric History: Admits to depression and anxiety. PCP prscribes Cymbalta. Denies being in counseling. 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." Pt mother would like for her dtr to "be as independent as possible." States she would like for her dtr  to "be able to strengthen the area of her brain that  has been weakened due the surgery, and precautions to avoid irritaing the surgical area."  [employer]: None Premorbid Home Care/DME Agencies: None Transportation available at discharge: TBD Is the patient able to respond to transportation needs?: Yes In the past 12 months, has lack of transportation kept you from medical appointments or from getting medications?: No In the past 12 months, has lack of transportation kept you from meetings, work, or from getting things needed for daily living?: No Resource referrals recommended: Neuropsychology  Discharge Planning Living Arrangements: Other relatives Support Systems: Other relatives, Parent Type of Residence: Private residence Insurance Resources: [employer] (specify county) ([place]) Pensions consultant: Other (Comment) (Unemployment) Financial Screen Referred: No Living Expenses: Mortgage Money Management: Patient Does the patient have any problems obtaining your medications?: No Home Management: Pt managed all homecare needs Patient/Family Preliminary Plans: TBD Care Coordinator Barriers to Discharge: Insurance for SNF coverage Care Coordinator Anticipated Follow Up Needs: HH/OP  Clinical Impression SW met with pt and pt mother in room to introduce self, explain role, and discuss discharge process. Pt is not a English as a second language teacher. No formal HCPOA on file; reports it is her son Karle Starch. No DME. Pt mother reports pt son Karle Starch is primary contact for updates.   1610-SW spoke with pt son Karle Starch to introduce self, explain role, and discuss discharge process. He is aware there will be follow-up on [facility], and updates after team conference.   SW emailed [facility] Medicaid asking for [facility] referral request and waiting on follow-up. SW left message for [place] CM for authorization review [phone]) asking for assistance with getting the primary CM managing her case.   Discharge  Address (Uncle- Brayton Caves Sr):  [phone], [place], [place] [address]   *[employer] CM for [facility] referral is Elayne Guerin [phone]) and receives LTSS [phone] form that must be completed.  1650-SW left message for Anderson [place] asking for assistance with [facility] referral, and has required form that needs to be submitted. SW waiting on follow-up.  *SW received a return phone call from [facility] reporting she will need form returned, and this form will be sent to [facility].   Rana Snare 02/16/2023, 4:31 PM

## 2023-02-16 NOTE — Progress Notes (Signed)
Occupational Therapy Note  Patient Details  Name: Evelyn [name] MRN: [id number] Date of Birth: 07/11/1971  Today's Date: 02/16/2023 OT Missed Time: 84 Minutes Missed Time Reason: Patient ill (comment) (nausea)  Attempted to see pt for scheduled OT treatment session. Pt reported she was still nauseous with headache and declined to participate at this time. OT will follow up per plan of care.    Daneen Schick Rubye Strohmeyer 02/16/2023, 1:20 PM

## 2023-02-16 NOTE — Evaluation (Signed)
Physical Therapy Assessment and Plan  Patient Details  Name: Evelyn [name] MRN: [id number] Date of Birth: October 26, 1971  PT Diagnosis: Abnormality of gait, Difficulty walking, and Muscle weakness Rehab Potential: Excellent ELOS: 5-7 days   Today's Date: 02/16/2023 PT Individual Time: 0800-0913 PT Individual Time Calculation (min): 53 min    Hospital Problem: Principal Problem:   Meningioma, cerebral ([place])   Past Medical History:  Past Medical History:  Diagnosis Date   Anemia    Anxiety    Depression    GERD (gastroesophageal reflux disease)    Headache    Obesity    Seizures ([place])    Sleep apnea    Past Surgical History:  Past Surgical History:  Procedure Laterality Date   ABDOMINAL HYSTERECTOMY     APPLICATION OF CRANIAL NAVIGATION N/A 02/08/2023   Procedure: APPLICATION OF CRANIAL NAVIGATION;  Surgeon: Judith Part, MD;  Location: [facility];  Service: Neurosurgery;  Laterality: N/A;   BREAST LUMPECTOMY Left 1997   CHOLECYSTECTOMY     CRANIOTOMY Left 02/08/2023   Procedure: Left craniotomy for tumor resection with stealth navigation;  Surgeon: Judith Part, MD;  Location: [facility];  Service: Neurosurgery;  Laterality: Left;   GASTRIC BYPASS  03/2017   THROAT SURGERY     removed scar tissue from throat d/t being intubated.    TUBAL LIGATION      Assessment & Plan Clinical Impression: Patient is a 52 year old female with new onset seizures with left- sided visual loss. Started on Keppra. 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 She underwent left craniotomy and tumor resection by Dr Zada Finders on 02/08/2023. Moderate headache. Vision improved. Keppra continued. Started on heparin [place] for VTE prophy. Expected left periorbital edema. Patient  requiring min guard assist with cues for transfers and safety and min assist for ambulation. Final path is grade 2 meningioma. Headaches improve with Toradol. The patient requires inpatient physical medicine and  rehabilitation evaluations and treatment secondary to dysfunction due to large meningioma.   Main complaints are decrease vision acuity of left eye due to swelling and myopia as well as constipation.Patient transferred to CIR on 02/15/2023 .   Patient currently requires  CGA/minA  with mobility secondary to muscle weakness, decreased cardiorespiratoy endurance, decreased visual acuity and decreased visual motor skills, and decreased standing balance and decreased balance strategies.  Prior to hospitalization, patient was independent  with mobility and lived with Daughter (66 y/0) in a House (uncle's house - double amputee) home.  Home access is  Ramped entrance.  Patient will benefit from skilled PT intervention to maximize safe functional mobility, minimize fall risk, and decrease caregiver burden for planned discharge home with 24 hour supervision.  Anticipate patient will benefit from follow up OP at discharge.  PT - End of Session Activity Tolerance: Tolerates 10 - 20 min activity with multiple rests Endurance Deficit: Yes Endurance Deficit Description: hypotension during evaluation PT Assessment Rehab Potential (ACUTE/IP ONLY): Excellent PT Barriers to Discharge: Insurance for SNF coverage;Other (comments) (vision loss) PT Patient demonstrates impairments in the following area(s): Balance;Endurance;Safety;Skin Integrity PT Transfers Functional Problem(s): Bed Mobility;Bed to Chair;Car PT Locomotion Functional Problem(s): Ambulation;Stairs PT Plan PT Intensity: Minimum of 1-2 x/day ,45 to 90 minutes PT Frequency: 5 out of 7 days PT Duration Estimated Length of Stay: 5-7 days PT Treatment/Interventions: Training and development officer;Ambulation/gait training;Cognitive remediation/compensation;Community reintegration;Discharge planning;DME/adaptive equipment instruction;Functional electrical stimulation;Disease management/prevention;Functional mobility training;Neuromuscular re-education;Pain  management;Psychosocial support;Patient/family education;Skin care/wound management;Stair training;Therapeutic Activities;Therapeutic Exercise;UE/LE Strength taining/ROM;UE/LE Coordination activities;Visual/perceptual remediation/compensation PT  Transfers Anticipated Outcome(s): mod I PT Locomotion Anticipated Outcome(s): Supervision PT Recommendation Recommendations for Other Services: Neuropsych consult Follow Up Recommendations: Outpatient PT;24 hour supervision/assistance Patient destination: Home Equipment Recommended: To be determined   PT Evaluation Precautions/Restrictions Precautions Precautions: Fall Restrictions Weight Bearing Restrictions: No Pain Pain Assessment Pain Scale: 0-10 Pain Score: 8  Pain Type: Acute pain Pain Location: Head Pain Orientation: Left Pain Descriptors / Indicators: Headache Pain Intervention(s): Medication (See eMAR) Pain Interference Pain Interference Pain Effect on Sleep: 3. Frequently Pain Interference with Therapy Activities: 2. Occasionally Pain Interference with Day-to-Day Activities: 1. 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 Vision/Perception  Vision - Assessment Eye Alignment: Impaired (comment) (L eye stays vertical when open - able to reach neutral but quickly rotates upwards) Ocular Range of Motion:  Impaired-to be further tested in functional context;Restricted looking down Alignment/Gaze Preference: Within Defined Limits Tracking/Visual Pursuits: Decreased smoothness of horizontal tracking;Decreased smoothness of vertical tracking;Impaired - to be further tested in functional context Saccades: Decreased speed of saccadic movement;Impaired - to be further tested in functional context;Additional head turns occurred during testing Convergence: Impaired (comment) Perception Perception: Impaired Inattention/Neglect: Does not attend to left visual field Praxis Praxis: Intact  Cognition Overall Cognitive Status: Within Functional Limits for tasks assessed Arousal/Alertness: Awake/alert Orientation Level: Oriented X4 Attention: Selective Selective Attention: Appears intact Memory: Appears intact Awareness: Appears intact Problem Solving: Appears intact Safety/Judgment: Appears intact Sensation Sensation Light Touch: Appears Intact Hot/Cold: Appears Intact Proprioception: Appears Intact Stereognosis: Appears Intact Coordination Coordination and Movement Description: Impacted by vision loss in L eye Motor  Motor Motor: Other (comment) Motor - Skilled Clinical Observations: Generalized weakness and deconditioning   Trunk/Postural Assessment  Cervical Assessment Cervical Assessment: Within Functional Limits Thoracic Assessment Thoracic Assessment: Exceptions to [facility] (rounded shoulders) Lumbar Assessment Lumbar Assessment: Within Functional Limits Postural Control Postural Control: Deficits on evaluation Righting Reactions: delayed stepping strategies  Balance Balance Balance Assessed: Yes Static Sitting Balance Static Sitting - Balance Support: Feet supported;No upper extremity supported Static Sitting - Level of Assistance: 7: Independent Dynamic Sitting Balance Dynamic Sitting - Balance Support: Feet supported;No upper extremity supported Dynamic Sitting - Level of  Assistance: 5: Stand by assistance Static Standing Balance Static Standing - Balance Support: No upper extremity supported Static Standing - Level of Assistance: 5: Stand by assistance Dynamic Standing Balance Dynamic Standing - Balance Support: No upper extremity supported;During functional activity Dynamic Standing - Level of Assistance: 4: Min assist Extremity Assessment      RLE Assessment RLE Assessment: Exceptions to [facility] General Strength Comments: Grossly 4+/5 LLE Assessment LLE Assessment: Exceptions to [facility] General Strength Comments: Grossly 4+/5  Care Tool Care Tool Bed Mobility Roll left and right activity   Roll left and right assist level: Supervision/Verbal cueing    Sit to lying activity   Sit to lying assist level: Supervision/Verbal cueing    Lying to sitting on side of bed activity   Lying to sitting on side of bed assist level: the ability  to move from lying on the back to sitting on the side of the bed with no back support.: Supervision/Verbal cueing     Care Tool Transfers Sit to stand transfer   Sit to stand assist level: Contact Guard/Touching assist    Chair/bed transfer   Chair/bed transfer assist level: Contact Guard/Touching assist     Psychologist, counselling transfer activity did not occur: Safety/medical concerns (nausea and fatigue)        Care Tool Locomotion Ambulation   Assist level: Contact Guard/Touching assist Assistive device: Walker-rolling Max distance: 11f  Walk 10 feet activity   Assist level: Contact Guard/Touching assist Assistive device: Walker-rolling   Walk 50 feet with 2 turns activity   Assist level: Contact Guard/Touching assist Assistive device: Walker-rolling  Walk 150 feet activity   Assist level: Contact Guard/Touching assist Assistive device: Walker-rolling  Walk 10 feet on uneven surfaces activity Walk 10 feet on uneven surfaces activity did not occur: Safety/medical concerns       Stairs   Assist level: Contact Guard/Touching assist Stairs assistive device: 2 hand rails Max number of stairs: 12  Walk up/down 1 step activity   Walk up/down 1 step (curb) assist level: Contact Guard/Touching assist Walk up/down 1 step or curb assistive device: 2 hand rails  Walk up/down 4 steps activity   Walk up/down 4 steps assist level: Contact Guard/Touching assist Walk up/down 4 steps assistive device: 2 hand rails  Walk up/down 12 steps activity   Walk up/down 12 steps assist level: Contact Guard/Touching assist Walk up/down 12 steps assistive device: 2 hand rails  Pick up small objects from floor Pick up small object from the floor (from standing position) activity did not occur: Safety/medical concerns      Wheelchair Is the patient using a wheelchair?: No          Wheel 50 feet with 2 turns activity      Wheel 150 feet activity        Refer to Care Plan for Long Term Goals  SHORT TERM GOAL WEEK 1 PT Short Term Goal 1 (Week 1): STG = LTG due to ELOS  Recommendations for other services: Neuropsych  Skilled Therapeutic Intervention Mobility Bed Mobility Bed Mobility: Supine to Sit;Sit to Supine Supine to Sit: Supervision/Verbal cueing Sit to Supine: Supervision/Verbal cueing Transfers Transfers: Sit to Stand;Stand to Sit;Stand Pivot Transfers Sit to Stand: Contact Guard/Touching assist Stand Pivot Transfers: Contact Guard/Touching assist Stand Pivot Transfer Details: Verbal cues for gait pattern;Verbal cues for sequencing;Verbal cues for precautions/safety;Tactile cues for weight shifting Transfer (Assistive device): Rolling walker Locomotion  Gait Ambulation: Yes Gait Assistance: Contact Guard/Touching assist Gait Distance (Feet): 150 Feet Assistive device: Rolling walker Gait Gait: Yes Gait Pattern: Step-through pattern;Narrow base of support Gait velocity: decreased Stairs / Additional Locomotion Stairs: Yes Stairs Assistance: Contact  Guard/Touching assist Stair Management Technique: Two rails;Step to pattern;Forwards Number of Stairs: 12 Height of Stairs: 6 Wheelchair Mobility Wheelchair Mobility: No  Skilled Intervention: Pt greeted in bed to start - mother at bedside. Pt agreeable to PT evaluation. Pt pleasant and cooperative, very motivated. Patient A&Ox4, no focal motor or sensory deficits. Initiated functional mobility as outlined above. She presents primarily with deficits in dynamic standing balance, dynamic gait, and L vision impairments. During session, after navigating a flight of stairs, she felt noxious and weak, needing to lie down. BP as soft as 98/77 and returns to 125/88 at end of session when she  lies down. Care team notified of hypotension. Unable to complete parts of Evaluation due to feeling unwell. Session concluded with patient in bed, comfortable, and alarm on.   Discharge Criteria: Patient will be discharged from PT if patient refuses treatment 3 consecutive times without medical reason, if treatment goals not met, if there is a change in medical status, if patient makes no progress towards goals or if patient is discharged from hospital.  The above assessment, treatment plan, treatment alternatives and goals were discussed and mutually agreed upon: by patient  Alger Simons PT, DPT 02/16/2023, 9:14 AM

## 2023-02-16 NOTE — Discharge Summary (Signed)
Physician Discharge Summary  Patient ID: Evelyn [name] MRN: [id number] DOB/AGE: [birth year] 52 y.o.  Admit date: 02/15/2023 Discharge date: 02/26/2023  Discharge Diagnoses:  Principal Problem:   Meningioma, cerebral ([place]) Active Problems:   Adjustment disorder with anxious mood   Adjustment reaction with anxiety and depression  Active problems: Functional deficits secondary to meningioma s/p craniectomy Periorbital edema with decreased vision acuity Headache Mood disorder Class 3 obesity Normocytic anemia Constipation  Discharged Condition: stable  Significant Diagnostic Studies:  Narrative & Impression  CLINICAL DATA:  Headache, neuro deficit.   EXAM: MRI HEAD WITHOUT CONTRAST   TECHNIQUE: Multiplanar, multiecho pulse sequences of the brain and surrounding structures were obtained without intravenous contrast.   COMPARISON:  Head CT February 21, 2023; MRI of the brain February 08, 2019.   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 Area of encephalomalacia and gliosis in the inferior left frontal lobe with peripheral restricted diffusion consistent with expected marginal ischemia. A fluid collection within the inferior aspect of the left middle cranial fossa, likely subdural, at the site of tumor resection with associated susceptibility artifact related blood products/pneumocephalus measures approximately 46 x 45 x 23 mm, mildly enlarged compared to prior MRI but stable since recent CT. An epidural fluid collection subjacent to the craniotomy now measures 9 mm, unchanged. This epidural collection communicates through the craniotomy to a larger left frontal scalp collection that measures 18 mm in thickness, compared to approximately 14 mm on prior CT.   The epidural/subdural fluid collection resulting in mass effect on right frontal lobe with an approximately 4 mm  rightward midline shift, stable.   Enlarged, partially empty sella likely related to longstanding intracranial hypertension.   Vascular: Mildly posteriorly displaced left MCA branches. Flow voids are maintained.   Skull and upper cervical spine: Postsurgical changes from left pterional craniotomy. No focal marrow lesion identified.   Sinuses/Orbits: Negative.   Other: None.   IMPRESSION: Stable postsurgical changes in the left frontal lobe with stable subdural/epidural fluid collection but mild increase of the left frontal scalp fluid collection, most consistent with a pseudomeningocele. Stable 4 mm rightward midline shift.     Electronically Signed   By: Pedro Earls M.D.   On: 02/22/2023 12:57    Narrative & Impression  CLINICAL DATA:  Headache, increasing frequency or severity Recent meningioma resection, this AM with severe sharp bilateral Headache and difficulty with EOMI   EXAM: CT HEAD WITHOUT CONTRAST   TECHNIQUE: Contiguous axial images were obtained from the base of the skull through the vertex without intravenous contrast.   RADIATION DOSE REDUCTION: This exam was performed according to the departmental dose-optimization program which includes automated exposure control, adjustment of the mA and/or kV according to patient size and/or use of iterative reconstruction technique.   COMPARISON:  MRI February 08, 2023.   FINDINGS: Brain: Postoperative changes of meningioma resection in the inferior left frontal lobe. There is extensive edema in this region with some rightward midline shift (approximately 5 mm). Additionally there is an overlying extra-axial fluid collection which measures approximately 6 mm in thickness. No evidence of new/interval mass occupying acute hemorrhage or acute large vascular territory infarct. No hydrocephalus.   Vascular: No hyperdense vessel identified.   Skull: Left frontal craniotomy. Overlying scalp  edema/fluid collection.   Sinuses/Orbits: Clear sinuses.  No acute orbital findings.   Other: No mastoid effusions.   IMPRESSION: 1. Findings of recent meningioma resection with edema  in the resection cavity, locules of gas, approximately 5 mm of midline shift, and approximately 6 mm thick overlying extra-axial fluid collection. Overall, findings do not appear substantially changed relative to recent MRI; however, direct comparison is limited by CT. An MRI with contrast could better assess for any new/interval complication if clinically warranted. 2. Increasing edema/fluid overlying the left frontal craniotomy. Findings are sterility indeterminate by imaging.     Electronically Signed   By: Margaretha Sheffield M.D.   On: 02/21/2023 10:39     Labs:  Basic Metabolic Panel: Recent Labs  Lab 02/19/23 0654  NA 136  K 4.7  CL 103  CO2 21*  GLUCOSE 87  BUN 6  CREATININE 0.80  CALCIUM 8.7*    CBC: Recent Labs  Lab 02/19/23 0654  WBC 4.7  HGB 8.8*  HCT 28.2*  MCV 83.2  PLT 333   Family history.  Mother and paternal aunt as well as paternal grandmother with breast cancer.  Denies any colon cancer esophageal cancer or rectal cancer  Brief HPI:   Evelyn [name] is a 52 y.o. female new onset seizures with left- sided visual loss. Started on Keppra. 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 She underwent left craniotomy and tumor resection by Dr Zada Finders on 02/08/2023. Moderate headache. Vision improved. Keppra continued. Started on heparin [place] for VTE prophy. Expected left periorbital edema. Patient  requiring min guard assist with cues for transfers and safety and min assist for ambulation. Final path is grade 2 meningioma. Headaches improve with Toradol.    Hospital Course: Evelyn [name] was admitted to rehab 02/15/2023 for inpatient therapies to consist of PT, ST and OT at least three hours five days a week. Past admission physiatrist, therapy team  and rehab RN have worked together to provide customized collaborative inpatient rehab. Attention turned to constipation with no BM since admission. Toradol for h/a continued and added gabapentin 300 mg every 8 hours as needed. Follow-up labs with H and H of 8.7 and 28.1. BMET stable. Gabapentin discontinued 2/24 due to history of lethargy on this medication. Episode of hypotension on 2/24 with presyncope.  Her blood pressure responded to IV fluids and follow-up labs obtained.  Serum cortisol within normal limits. Hemoglobin stable. Still cannot comfortably perform superior gaze with L eye, but otherwise EOM improving on 2/27. Woke up with intense bifrontal headache and difficulty with worsening pain and headache with EOM on 2/28. One time dose of gabapentin 300 mg given. Following morning on 2/29, she again complained of sever headache with new neuro deficits including LLE weakness land LUE spasms. Complained of severe pain and tension across L V1 distribution where she was prior numb. Stat head CT performed and Dr. Zada Finders evaluated the patient.  Reassurance that no acute event or changed seen on imaging studies. See his note on 2/29. Neuropsych eval on 3/01.  Blood pressures were monitored on TID basis and had episode of symptomatic orthostatic hypotension on 2/24 resolved with IV fluids.  Rehab course: During patient's stay in rehab weekly team conferences were held to monitor patient's progress, set goals and discuss barriers to discharge. At admission, patient required CGA/min assist with mobility, min assist.  Physical exam.  Blood pressure 111/57 pulse 78 temperature 98 respirations 18 oxygen saturation is 96% room air Constitutional.  No acute distress HEENT Head.  Normocephalic and atraumatic.  Left crani site clean and dry Eyes.  Pupils round and reactive to light Neck.  Supple nontender no JVD without  thyromegaly Cardiac regular rate and rhythm without any extra sounds or murmur  heard Abdomen.  Soft nontender positive bowel sounds without rebound Respiratory effort normal no respiratory distress without wheeze Musculoskeletal.  Right upper extremity 5/5 SA, EF, EE, W EE, FF, FA Left upper extremity 5/5 SA, EF, EE, W EE, FF, SA Right lower extremity 5/5 HF, KE, DF, EHL, PF Left lower extremity 5/5 HF KE DF EHL PF  She has had improvement in activity tolerance, balance, postural control as well as ability to compensate for deficits. She has had improvement in functional use RUE/LUE  and RLE/LLE as well as improvement in awareness.  Supine to sit edge of bed independent.  Ambulates to the bathroom without the use of assistive device.  Completing all toileting task independently.  Ambulates throughout the fourth floor to various rehab gym's without the use of assistive device.  Ascends and descends 12 steps bilateral handrails modified independent.  Full family teaching completed plan discharge to home   Disposition: Discharge to home    Diet: regular  Special Instructions: No driving, alcohol consumption or tobacco use.  Medications at discharge. 1.  Tylenol as needed 2.  Fioricet 50-325-40 mg 2 tablets every 6 hours as needed headache 3.  Voltaren gel 2 g twice daily 4.  Cymbalta 30 mg p.o. nightly 2200 5.  Cymbalta 60 mg p.o. daily 0800 6.  Pepcid 20 mg p.o. twice daily 7.  Keppra 750 mg p.o. twice daily 8.  Robaxin 500 mg p.o. every 6 hours as needed muscle spasms 9.  MiraLAX twice daily hold for loose stools 10.  Topamax 50 mg p.o. twice daily 11.  Senokot S2 tablets daily hold for loose stools 12.  Vitamin D 50,000 units weekly   30-35 minutes were spent completing discharge summary and discharge planning  Discharge Instructions     Ambulatory referral to Occupational Therapy   Complete by: As directed    Eval and treat   Ambulatory referral to Physical Medicine Rehab   Complete by: As directed    Moderate complexity follow up 1-2 weeks  meningioma   Ambulatory referral to Physical Therapy   Complete by: As directed    Eval and treat        Follow-up Information     Daisy Lazar A, PA-C Follow up.   Specialty: Physician Assistant Why: Call the office in 1-2 days to make arrangements for hospital follow-up appointment. Contact information: [place] [address] [phone]         Durel Salts C, DO Follow up.   Specialty: Physical Medicine and Rehabilitation Why: office will call you to arrange your appt (sent) Contact information: [address] [place] [address] [phone]         Judith Part, MD Follow up.   Specialty: Neurosurgery Why: Call the office in 1-2 days to make arrangements for hospital follow-up appointment. Contact information: [place] [address] [phone]                 Signed: Lavon Paganini [place] 02/26/2023, 5:23 AM

## 2023-02-17 LAB — BASIC METABOLIC PANEL
Anion gap: 13 (ref 5–15)
BUN: 9 mg/dL (ref 6–20)
CO2: 21 mmol/L — ABNORMAL LOW (ref 22–32)
Calcium: 8.5 mg/dL — ABNORMAL LOW (ref 8.9–10.3)
Chloride: 100 mmol/L (ref 98–111)
Creatinine, Ser: 1 mg/dL (ref 0.44–1.00)
GFR, Estimated: >60 mL/min (ref >60)
Glucose, Bld: 164 mg/dL — ABNORMAL HIGH (ref 70–99)
Potassium: 3.7 mmol/L (ref 3.5–5.1)
Sodium: 134 mmol/L — ABNORMAL LOW (ref 135–145)

## 2023-02-17 LAB — CBC
HCT: 28.4 % — ABNORMAL LOW (ref 36.0–46.0)
Hemoglobin: 8.9 g/dL — ABNORMAL LOW (ref 12.0–15.0)
MCH: 26.5 pg (ref 26.0–34.0)
MCHC: 31.3 g/dL (ref 30.0–36.0)
MCV: 84.5 fL (ref 80.0–100.0)
Platelets: 323 10*3/uL (ref 150–400)
RBC: 3.36 MIL/uL — ABNORMAL LOW (ref 3.87–5.11)
RDW: 14.4 % (ref 11.5–15.5)
WBC: 7.5 10*3/uL (ref 4.0–10.5)
nRBC: 0 % (ref 0.0–0.2)

## 2023-02-17 LAB — CORTISOL-AM, BLOOD: Cortisol - AM: 17.1 ug/dL (ref 6.7–22.6)

## 2023-02-17 NOTE — Plan of Care (Signed)
  Problem: RH Balance Goal: LTG Patient will maintain dynamic standing with ADLs (OT) Description: LTG:  Patient will maintain dynamic standing balance with assist during activities of daily living (OT)  Flowsheets (Taken 02/17/2023 1117) LTG: Pt will maintain dynamic standing balance during ADLs with: Independent with assistive device   Problem: Sit to Stand Goal: LTG:  Patient will perform sit to stand in prep for activites of daily living with assistance level (OT) Description: LTG:  Patient will perform sit to stand in prep for activites of daily living with assistance level (OT) Flowsheets (Taken 02/17/2023 1117) LTG: PT will perform sit to stand in prep for activites of daily living with assistance level: Independent with assistive device   Problem: RH Eating Goal: LTG Patient will perform eating w/assist, cues/equip (OT) Description: LTG: Patient will perform eating with assist, with/without cues using equipment (OT) Flowsheets (Taken 02/17/2023 1117) LTG: Pt will perform eating with assistance level of: Independent   Problem: RH Grooming Goal: LTG Patient will perform grooming w/assist,cues/equip (OT) Description: LTG: Patient will perform grooming with assist, with/without cues using equipment (OT) Flowsheets (Taken 02/17/2023 1117) LTG: Pt will perform grooming with assistance level of: Independent   Problem: RH Bathing Goal: LTG Patient will bathe all body parts with assist levels (OT) Description: LTG: Patient will bathe all body parts with assist levels (OT) Flowsheets (Taken 02/17/2023 1117) LTG: Pt will perform bathing with assistance level/cueing: Independent with assistive device    Problem: RH Dressing Goal: LTG Patient will perform upper body dressing (OT) Description: LTG Patient will perform upper body dressing with assist, with/without cues (OT). Flowsheets (Taken 02/17/2023 1117) LTG: Pt will perform upper body dressing with assistance level of: Independent with  assistive device Goal: LTG Patient will perform lower body dressing w/assist (OT) Description: LTG: Patient will perform lower body dressing with assist, with/without cues in positioning using equipment (OT) Flowsheets (Taken 02/17/2023 1117) LTG: Pt will perform lower body dressing with assistance level of: Independent with assistive device   Problem: RH Toileting Goal: LTG Patient will perform toileting task (3/3 steps) with assistance level (OT) Description: LTG: Patient will perform toileting task (3/3 steps) with assistance level (OT)  Flowsheets (Taken 02/17/2023 1117) LTG: Pt will perform toileting task (3/3 steps) with assistance level: Independent with assistive device   Problem: RH Vision Goal: RH LTG 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) Flowsheets (Taken 02/17/2023 1117) LTG: Vision Goals: Patient will keep L eye open for 50% of session with min cues   Problem: RH Toilet Transfers Goal: LTG Patient will perform toilet transfers w/assist (OT) Description: LTG: Patient will perform toilet transfers with assist, with/without cues using equipment (OT) Flowsheets (Taken 02/17/2023 1117) LTG: Pt will perform toilet transfers with assistance level of: Independent with assistive device   Problem: RH Tub/Shower Transfers Goal: LTG Patient will perform tub/shower transfers w/assist (OT) Description: LTG: Patient will perform tub/shower transfers with assist, with/without cues using equipment (OT) Flowsheets (Taken 02/17/2023 1117) LTG: Pt will perform tub/shower stall transfers with assistance level of: Independent with assistive device

## 2023-02-17 NOTE — Progress Notes (Signed)
Physical Therapy Session Note  Patient Details  Name: Evelyn [name] MRN: [id number] Date of Birth: [birth year]  Today's Date: 02/17/2023 PT Individual Time: 1000-1058 PT Individual Time Calculation (min): 58 min   Short Term Goals: Week 1:  PT Short Term Goal 1 (Week 1): STG = LTG due to ELOS  Skilled Therapeutic Interventions/Progress Updates:  Pt was seen bedside in the am. Pt transferred supine to edge of bed with S and verbal cues. Pt able to don shoes at edge of bed with S. Pt transferred sit to stand with c/g and rolling walker. Pt ambulated distances of 150 feet x 2, 200 feet x 2 and 20 feet x 2 with rolling walker and c/g. Pt performed cone taps, alternating cone taps, criss cross cone taps and alternating criss cross cone taps, 3 sets x 1 0 reps each for LE strengthening and NMR. Pt returned to room following treatment. Pt transferred edge of bed to supine with S and verbal cues. Pt left sitting up In bed with bed alarm on and all needs within reach.   Therapy Documentation Precautions:  Precautions Precautions: Fall Restrictions Weight Bearing Restrictions: No  Pain: Pt c/o 6/10 headache, received medicine prior to treatment.     Therapy/Group: Individual Therapy  Dub Amis 02/17/2023, 12:32 PM

## 2023-02-17 NOTE — Progress Notes (Signed)
Occupational Therapy Session Note  Patient Details  Name: Zylphia Sergio MRN: [id number] Date of Birth: [birth year]  Today's Date: 02/17/2023 OT Individual Time: 1241-1351 OT Individual Time Calculation (min): 70 min    Short Term Goals: Week 1:  OT Short Term Goal 1 (Week 1): LTG=STG 2/2 ELOS  Skilled Therapeutic Interventions/Progress Updates:    Pt greeted semi-reclined in bed after lunch and agreeable to OT treatment session. Pt brought down to therapy gym ambulating with RW and close supervision. 9-hole PEG tes completed. L:38, R: 31  Standing balance/endurance and UB there-ex using 4 lb weighted bar. 3 setss of 10 chest press, bicep curl, and straight arm raise. CGA for balance.   Standing ball toss with oblique twist x10.   NuStep on level 3 for 10 minutes for strength and endurance.   Pt ambulated back to room w/o AD, and min A with cues to not overthink the steps- 97 feet. A little anxious walking without a device. Pt left semi-reclined in bed with bed alarm on, call bell in reach, and needs met.   Therapy Documentation Precautions:  Precautions Precautions: Fall Restrictions Weight Bearing Restrictions: No Pain:  Denies pain  Therapy/Group: Individual Therapy  Valma Cava 02/17/2023, 12:35 PM

## 2023-02-17 NOTE — Progress Notes (Signed)
PROGRESS NOTE   Subjective/Complaints:  No acute complaints.  No events overnight.  Seen in gym working on balance, reports headache ongoing but now mild, significantly improved from yesterday.  ROS: + Chills, positive nausea, positive headache-headache now mild, other symptoms resolved.  No abdominal pain, constipation, diarrhea, SOB, cough, chest pain, new weakness or paraesthesias.    Objective:   No results found. Recent Labs    02/16/23 0801 02/16/23 1214 02/17/23 0731  WBC 6.5  --  7.5  HGB 8.7* 8.6* 8.9*  HCT 28.1* 27.0* 28.4*  PLT 332  --  323    Recent Labs    02/16/23 0801 02/17/23 0731  NA 134* 134*  K 3.5 3.7  CL 101 100  CO2 19* 21*  GLUCOSE 155* 164*  BUN 7 9  CREATININE 1.10* 1.00  CALCIUM 8.5* 8.5*     Intake/Output Summary (Last 24 hours) at 02/17/2023 1525 Last data filed at 02/17/2023 1416 Gross per 24 hour  Intake 1713.85 ml  Output --  Net 1713.85 ml         Physical Exam: Vital Signs Blood pressure 126/77, pulse 71, temperature 98.2 F (36.8 C), temperature source Oral, resp. rate 16, height '5\' 7"'$  (1.702 m), weight 130.7 kg, SpO2 98 %.  Physical Exam Constitutional: Appropriate appearance for age.  No acute distress.  +Obese HENT: No JVD. Neck Supple. Trachea midline. + Left craniotomy, with stable edema from prior exam Eyes: PERRLA. EOMI. +Discomfort with superior gaze both monocular and binocular  -improved Cardiovascular: RRR, no murmurs/rub/gallops.  Peripheral pulses 2+  Respiratory: CTAB. No rales, rhonchi, or wheezing. On RA.  Abdomen: + bowel sounds, normoactive. No distention or tenderness.  Skin: + L craniotomy site with absorbable sutures, site c/d/I, significant localized swelling MSK:      No apparent deformity.      Strength: Moving all 4 limbs antigravity and against resistance.  Neurologic exam:  Cognition: AAO to person, place, time and event.  No new  deficits appreciated from prior exam. Language: Fluent, No substitutions or neoglisms. No dysarthria.  Insight: Good insight into current condition.  Mood: Pleasant affect, appropriate mood.  CN: + L V1 sensory deficit -improved;  + L ptosis (? C/b periorbital edema)-able to open left eye fully during exam, primarily limited by edema. Coordination: No apparent tremors. + Mild LUE ataxia Spasticity: MAS 0 in all extremities.  Balance: Supervision with use of walker for single stance balance exercises with PT     Assessment/Plan: 1. Functional deficits which require 3+ hours per day of interdisciplinary therapy in a comprehensive inpatient rehab setting. Physiatrist is providing close team supervision and 24 hour management of active medical problems listed below. Physiatrist and rehab team continue to assess barriers to discharge/monitor patient progress toward functional and medical goals  Care Tool:  Bathing    Body parts bathed by patient: Right arm, Left arm, Chest, Front perineal area, Abdomen, Buttocks, Right upper leg, Left upper leg, Right lower leg, Left lower leg, Face         Bathing assist Assist Level: Contact Guard/Touching assist     Upper Body Dressing/Undressing Upper body dressing   What is the patient  wearing?: Pull over shirt    Upper body assist Assist Level: Supervision/Verbal cueing    Lower Body Dressing/Undressing Lower body dressing      What is the patient wearing?: Underwear/pull up, Pants     Lower body assist Assist for lower body dressing: Contact Guard/Touching assist     Toileting Toileting    Toileting assist Assist for toileting: Contact Guard/Touching assist     Transfers Chair/bed transfer  Transfers assist     Chair/bed transfer assist level: Contact Guard/Touching assist     Locomotion Ambulation   Ambulation assist      Assist level: Contact Guard/Touching assist Assistive device: Walker-rolling Max distance:  200   Walk 10 feet activity   Assist     Assist level: Contact Guard/Touching assist Assistive device: Walker-rolling   Walk 50 feet activity   Assist    Assist level: Contact Guard/Touching assist Assistive device: Walker-rolling    Walk 150 feet activity   Assist    Assist level: Contact Guard/Touching assist Assistive device: Walker-rolling    Walk 10 feet on uneven surface  activity   Assist Walk 10 feet on uneven surfaces activity did not occur: Safety/medical concerns         Wheelchair     Assist Is the patient using a wheelchair?: No             Wheelchair 50 feet with 2 turns activity    Assist            Wheelchair 150 feet activity     Assist          Blood pressure 126/77, pulse 71, temperature 98.2 F (36.8 C), temperature source Oral, resp. rate 16, height '5\' 7"'$  (1.702 m), weight 130.7 kg, SpO2 98 %.  Medical Problem List and Plan: 1. Functional deficits secondary to meningioma s/p craniotomy             -patient may shower             -ELOS/Goals: 7-10 days, supervision PT/OT   2.  Antithrombotics: -DVT/anticoagulation:  Pharmaceutical: Heparin             -antiplatelet therapy: none   3. Pain Management:  Ongoing post-op headache             - Can continue IV toradol for post-op Headache through admission overnight; transition to oral regimen in AM             - Patient endorses nausea/allergy to Vicodin/Norco/Dilaudid - seizures with Morphine - DC and add Gabapentin 300 mg PRN Q8H for pain, HA             - Per chart review ? Allergy to tylenol  - 2/23: Reviewed medications the patient has been able to tolerate, versus recorded allergies.  Adjusted med regimen to gabapentin 200 mg 3 times daily, Tylenol 650 mg scheduled 3 times daily, with as needed Fioricet for headaches, Toradol for moderate pain, and tramadol for severe pain.   -2-24: Symptoms much improved, continue current regimen.  Gabapentin  discontinued due to history of lethargy on this medication.  4. Mood/Behavior/Sleep: LCSW to evaluate and provide emotional support             -continue Cymbalta 60 mg daily (depressive disorder)             -antipsychotic agents: n/a   5. Neuropsych/cognition: This patient is capable of making decisions on his own behalf.   6. Skin/Wound Care: Routine  skin care checks   7. Fluids/Electrolytes/Nutrition: Routine Is and Os and follow-up chemistries -Mild AKI on admission labs with creatinine 1.1, BUN 7, along with hyponatremia 134 from 140.  Initiate IV fluids normal saline at 150 cc/h, 1 L total.  Will repeat labs in a.m. 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 -Hemoglobin stable 2-23, 24   11. Constipation. LBM PTA.              - Colace 100 mg BID + PRN Miralax inpatient; increase to BID miralax, Sennakot-S 2 tabs QHS             - PRN sorbitol, MOM, and Enema; encouraged patient to use a PRN tonight             -No recorded BM overnight, will monitor 1 day and encourage enema tomorrow if no bowel movement  13.  Orthostatic hypotension, with presyncope -resolved- May be due to dehydration with AKI versus adrenal insufficiency.  Replete IV fluids as above, if no improvement this afternoon will order a.m. cortisol with a.m. labs and depending on result may initiate slower steroid taper. -Symptoms resolved with IV fluids, labs stable, a.m. ordered cortisol 17, within normal limits -Headache regimen as above, much better tolerated today  LOS: 2 days A FACE TO FACE EVALUATION WAS PERFORMED  Gertie Gowda 02/17/2023, 3:25 PM

## 2023-02-17 NOTE — Progress Notes (Signed)
Occupational Therapy Assessment and Plan  Patient Details  Name: Evelyn [name] MRN: [id number] Date of Birth: [birth year]  OT Diagnosis: disturbance of vision and muscle weakness (generalized) Rehab Potential: Rehab Potential (ACUTE ONLY): Excellent ELOS: 5-7 days   Today's Date: 02/17/2023 OT Individual Time: [id number] OT Individual Time Calculation (min): 73 min     Hospital Problem: Principal Problem:   Meningioma, cerebral ([place])   Past Medical History:  Past Medical History:  Diagnosis Date   Anemia    Anxiety    Depression    GERD (gastroesophageal reflux disease)    Headache    Obesity    Seizures ([place])    Sleep apnea    Past Surgical History:  Past Surgical History:  Procedure Laterality Date   ABDOMINAL HYSTERECTOMY     APPLICATION OF CRANIAL NAVIGATION N/A 02/08/2023   Procedure: APPLICATION OF CRANIAL NAVIGATION;  Surgeon: Judith Part, MD;  Location: [facility];  Service: Neurosurgery;  Laterality: N/A;   BREAST LUMPECTOMY Left 1997   CHOLECYSTECTOMY     CRANIOTOMY Left 02/08/2023   Procedure: Left craniotomy for tumor resection with stealth navigation;  Surgeon: Judith Part, MD;  Location: [facility];  Service: Neurosurgery;  Laterality: Left;   GASTRIC BYPASS  03/2017   THROAT SURGERY     removed scar tissue from throat d/t being intubated.    TUBAL LIGATION      Assessment & Plan Clinical Impression: Patient is a 52 y.o. year old female with recent admission to the hospital on with new onset seizures with left- sided visual loss. Started on Keppra. 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 She underwent left craniotomy and tumor resection by Dr Zada Finders on 02/08/2023. Moderate headache. Vision improved. Keppra continued. Started on heparin [place] for VTE prophy. Expected left periorbital edema. Patient  requiring min guard assist with cues for transfers and safety and min assist for ambulation. Final path is grade 2 meningioma.  Headaches improve with Toradol. The patient requires inpatient physical medicine and rehabilitation evaluations and treatment secondary to dysfunction due to large meningioma. Patient transferred to CIR on 02/15/2023 .    Patient currently requires min with basic self-care skills secondary to muscle weakness, decreased attention to left, and decreased standing balance, decreased postural control, and decreased balance strategies.  Prior to hospitalization, patient could complete BADL with independent .  Patient will benefit from skilled intervention to increase independence with basic self-care skills prior to discharge home with care partner.  Anticipate patient will require intermittent supervision and follow up outpatient.  OT - End of Session Endurance Deficit: Yes Endurance Deficit Description: rest breaks within BADL tasks OT Assessment Rehab Potential (ACUTE ONLY): Excellent OT Patient demonstrates impairments in the following area(s): Balance;Edema;Endurance;Motor;Perception;Pain;Safety;Vision OT Basic ADL's Functional Problem(s): Grooming;Dressing;Toileting;Bathing;Eating OT Transfers Functional Problem(s): Tub/Shower;Toilet OT Additional Impairment(s): None OT Plan OT Intensity: Minimum of 1-2 x/day, 45 to 90 minutes OT Frequency: 5 out of 7 days OT Duration/Estimated Length of Stay: 5-7 days OT Treatment/Interventions: Balance/vestibular training;Cognitive remediation/compensation;Community reintegration;Disease Lawyer;Discharge planning;Functional mobility training;Neuromuscular re-education;Functional electrical stimulation;Pain management;Patient/family education;Psychosocial support;Self Care/advanced ADL retraining;Skin care/wound managment;Splinting/orthotics;Therapeutic Activities;Therapeutic Exercise;UE/LE Strength taining/ROM;UE/LE Coordination activities;Visual/perceptual remediation/compensation OT Self Feeding Anticipated  Outcome(s): Independent OT Basic Self-Care Anticipated Outcome(s): Mod I OT Toileting Anticipated Outcome(s): Mod I OT Bathroom Transfers Anticipated Outcome(s): Mod I OT Recommendation Patient destination: Home Follow Up Recommendations: Outpatient OT Equipment Recommended: To be determined Equipment Details: likely none   OT Evaluation Precautions/Restrictions  Precautions Precautions: Fall Restrictions Weight Bearing Restrictions:  No Pain Pain Assessment Pain Scale: 0-10 Pain Score: 6  Pain Location: Head Pain Descriptors / Indicators: Headache Pain Intervention(s): Rest and repositioned Home Living/Prior Functioning Home Living Family/patient expects to be discharged to:: Private residence Living Arrangements: Other relatives Available Help at Discharge: Family, Available 24 hours/day Type of Home: House Home Access: Ramped entrance Home Layout: One level Bathroom Shower/Tub: Multimedia programmer: Handicapped height Bathroom Accessibility: Yes Additional Comments: plans to stay with uncle and aunt and mother at discharge  Lives With: Daughter (15 year old daughter) IADL History Homemaking Responsibilities: Yes Prior Function Level of Independence: Independent with gait, Independent with transfers, Independent with homemaking with ambulation  Able to Take Stairs?: Yes Driving: No (due to siezures) Vocation: On disability Vocation Requirements: 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 Vision Baseline Vision/History: 1 Wears glasses Ability to See in Adequate Light: 1 Impaired Patient Visual Report: Blurring of vision;Eye fatigue/eye pain/headache Vision Assessment?: Vision impaired- to be further tested in functional context;Yes Perception  Perception: Impaired Inattention/Neglect: Does not attend to left visual field Praxis Praxis: Intact Cognition Cognition Overall Cognitive Status: Within Functional Limits for  tasks assessed Arousal/Alertness: Awake/alert Orientation Level: Place;Person;Situation Person: Oriented Place: Oriented Situation: Oriented Memory: Appears intact Attention: Selective Selective Attention: Appears intact Awareness: Appears intact Problem Solving: Appears intact Safety/Judgment: Appears intact Brief Interview for Mental Status (BIMS) Repetition of Three Words (First Attempt): 3 Temporal Orientation: Year: Correct Temporal Orientation: Month: Accurate within 5 days Temporal Orientation: Day: Correct Recall: "Sock": Yes, no cue required Recall: "Blue": Yes, no cue required Recall: "Bed": Yes, no cue required BIMS Summary Score: 15 Sensation Sensation Light Touch: Appears Intact Hot/Cold: Appears Intact Proprioception: Appears Intact Stereognosis: Appears Intact Coordination Coordination and Movement Description: Impacted by vision loss in L eye Motor  Motor Motor - Skilled Clinical Observations: Generalized weakness and deconditioning  Trunk/Postural Assessment     Balance Static Sitting Balance Static Sitting - Balance Support: Feet supported;No upper extremity supported Static Sitting - Level of Assistance: 7: Independent Dynamic Sitting Balance Dynamic Sitting - Balance Support: Feet supported;No upper extremity supported Dynamic Sitting - Level of Assistance: 5: Stand by assistance Static Standing Balance Static Standing - Balance Support: No upper extremity supported Static Standing - Level of Assistance: 5: Stand by assistance Dynamic Standing Balance Dynamic Standing - Balance Support: No upper extremity supported;During functional activity Dynamic Standing - Level of Assistance: 4: Min assist Extremity/Trunk Assessment RUE Assessment RUE Assessment: Within Functional Limits LUE Assessment LUE Assessment: Within Functional Limits  Care Tool Care Tool Self Care Eating   Eating Assist Level: Set up assist    Oral Care    Oral Care Assist  Level: Supervision/Verbal cueing    Bathing   Body parts bathed by patient: Right arm;Left arm;Chest;Front perineal area;Abdomen;Buttocks;Right upper leg;Left upper leg;Right lower leg;Left lower leg;Face     Assist Level: Contact Guard/Touching assist    Upper Body Dressing(including orthotics)   What is the patient wearing?: Pull over shirt   Assist Level: Supervision/Verbal cueing    Lower Body Dressing (excluding footwear)   What is the patient wearing?: Underwear/pull up;Pants Assist for lower body dressing: Contact Guard/Touching assist    Putting on/Taking off footwear   What is the patient wearing?: Socks;Shoes Assist for footwear: Contact Guard/Touching assist       Care Tool Toileting Toileting activity   Assist for toileting: Contact Guard/Touching assist     Care Tool Bed Mobility Roll left and  right activity   Roll left and right assist level: Supervision/Verbal cueing    Sit to lying activity   Sit to lying assist level: Supervision/Verbal cueing    Lying to sitting on side of bed activity   Lying to sitting on side of bed assist level: the ability to move from lying on the back to sitting on the side of the bed with no back support.: Supervision/Verbal cueing     Care Tool Transfers Sit to stand transfer   Sit to stand assist level: Contact Guard/Touching assist    Chair/bed transfer   Chair/bed transfer assist level: Contact Guard/Touching assist     Toilet transfer   Assist Level: Contact Guard/Touching assist     Care Tool Cognition  Expression of Ideas and Wants Expression of Ideas and Wants: 4. Without difficulty (complex and basic) - expresses complex messages without difficulty and with speech that is clear and easy to understand  Understanding Verbal and Non-Verbal Content Understanding Verbal and Non-Verbal Content: 4. Understands (complex and basic) - clear comprehension without cues or repetitions   Memory/Recall Ability Memory/Recall  Ability : Staff names and faces;Current season;Location of own room;That he or she is in a hospital/hospital unit   Refer to Care Plan for Long Term Goals  SHORT TERM GOAL WEEK 1 OT Short Term Goal 1 (Week 1): LTG=STG 2/2 ELOS  Recommendations for other services: Therapeutic Recreation  Stress management and Outing/community reintegration   Skilled Therapeutic Intervention OT eval completed addressing rehab process, OT purpose, POC, ELOS, and goals.  Pt ambulated with RW and CGA for bathroom transfers, overall CGA for BADL tasks. Pt with swelling of L eye making it difficult to open at times, but overall doing very well. No orthostatic episodes within BADL tasks. Pt returned to bed at end of session.  See below for further details regarding BADL performance.    ADL ADL Eating: Set up Grooming: Supervision/safety Upper Body Bathing: Supervision/safety Lower Body Bathing: Contact guard Upper Body Dressing: Supervision/safety Lower Body Dressing: Contact guard Toileting: Contact guard Toilet Transfer: Contact guard Walk-In Shower Transfer: Contact guard Mobility  Bed Mobility Bed Mobility: Supine to Sit;Sit to Supine Supine to Sit: Supervision/Verbal cueing Sit to Supine: Supervision/Verbal cueing Transfers Sit to Stand: Contact Guard/Touching assist   Discharge Criteria: Patient will be discharged from OT if patient refuses treatment 3 consecutive times without medical reason, if treatment goals not met, if there is a change in medical status, if patient makes no progress towards goals or if patient is discharged from hospital.  The above assessment, treatment plan, treatment alternatives and goals were discussed and mutually agreed upon: by patient and by family  Valma Cava 02/17/2023, 11:19 AM

## 2023-02-18 MED ORDER — SENNOSIDES-DOCUSATE SODIUM 8.6-50 MG PO TABS
2.0000 | ORAL_TABLET | Freq: Two times a day (BID) | ORAL | Status: DC
  Administered 2023-02-18 – 2023-02-24 (×10): 2 via ORAL
  Filled 2023-02-18 (×15): qty 2

## 2023-02-18 MED ORDER — MINERAL OIL RE ENEM
1.0000 | ENEMA | Freq: Once | RECTAL | Status: DC
  Filled 2023-02-18: qty 1

## 2023-02-18 NOTE — Progress Notes (Addendum)
PROGRESS NOTE   Subjective/Complaints:  No acute complaints.  No events overnight. Headache much improved, no further orthostasis. Mom concerned regarding area of increased swelling at bottom of incision. Also some itchiness of incision line but patient states she knows this is part of healing and will not scratch it.   ROS: + Chills, positive nausea, positive headache- all resolved.  No abdominal pain, constipation, diarrhea, SOB, cough, chest pain, new weakness or paraesthesias.    Objective:   No results found. Recent Labs    02/16/23 0801 02/16/23 1214 02/17/23 0731  WBC 6.5  --  7.5  HGB 8.7* 8.6* 8.9*  HCT 28.1* 27.0* 28.4*  PLT 332  --  323    Recent Labs    02/16/23 0801 02/17/23 0731  NA 134* 134*  K 3.5 3.7  CL 101 100  CO2 19* 21*  GLUCOSE 155* 164*  BUN 7 9  CREATININE 1.10* 1.00  CALCIUM 8.5* 8.5*     Intake/Output Summary (Last 24 hours) at 02/18/2023 1421 Last data filed at 02/18/2023 0806 Gross per 24 hour  Intake 480 ml  Output --  Net 480 ml         Physical Exam: Vital Signs Blood pressure (!) 110/57, pulse 77, temperature 99.3 F (37.4 C), temperature source Oral, resp. rate 17, height '5\' 7"'$  (1.702 m), weight 130.7 kg, SpO2 99 %.  Physical Exam Constitutional: Appropriate appearance for age.  No acute distress.  +Obese HENT: No JVD. Neck Supple. Trachea midline. + Left craniotomy  Eyes: PERRLA. EOMI. +Discomfort with superior gaze both monocular and binocular  -improved Cardiovascular: RRR, no murmurs/rub/gallops.  Peripheral pulses 2+  Respiratory: CTAB. No rales, rhonchi, or wheezing. On RA.  Abdomen: + bowel sounds, normoactive. No distention or tenderness.  Skin: + L craniotomy site with absorbable sutures, site c/d/I, significant localized swelling  - area on most inferior incision line of increased swelling, without tension, compressible and without warmth  MSK:      No  apparent deformity.      Strength: Moving all 4 limbs antigravity and against resistance.  Neurologic exam:  Cognition: AAO to person, place, time and event.  No new deficits appreciated from prior exam. Language: Fluent, No substitutions or neoglisms. No dysarthria.  Insight: Good insight into current condition.  Mood: Pleasant affect, appropriate mood.  CN: + L V1 sensory deficit -improved;  + L ptosis (? C/b periorbital edema)-able to open left eye fully in bursts, primarily limited by edema. Coordination: No apparent tremors. + Mild LUE ataxia Spasticity: MAS 0 in all extremities.  Balance: Supervision with use of walker for single stance balance exercises with PT     Assessment/Plan: 1. Functional deficits which require 3+ hours per day of interdisciplinary therapy in a comprehensive inpatient rehab setting. Physiatrist is providing close team supervision and 24 hour management of active medical problems listed below. Physiatrist and rehab team continue to assess barriers to discharge/monitor patient progress toward functional and medical goals  Care Tool:  Bathing    Body parts bathed by patient: Right arm, Left arm, Chest, Front perineal area, Abdomen, Buttocks, Right upper leg, Left upper leg, Right lower leg, Left lower  leg, Face         Bathing assist Assist Level: Contact Guard/Touching assist     Upper Body Dressing/Undressing Upper body dressing   What is the patient wearing?: Pull over shirt    Upper body assist Assist Level: Supervision/Verbal cueing    Lower Body Dressing/Undressing Lower body dressing      What is the patient wearing?: Underwear/pull up, Pants     Lower body assist Assist for lower body dressing: Contact Guard/Touching assist     Toileting Toileting    Toileting assist Assist for toileting: Contact Guard/Touching assist     Transfers Chair/bed transfer  Transfers assist     Chair/bed transfer assist level: Contact  Guard/Touching assist     Locomotion Ambulation   Ambulation assist      Assist level: Contact Guard/Touching assist Assistive device: Walker-rolling Max distance: 200   Walk 10 feet activity   Assist     Assist level: Contact Guard/Touching assist Assistive device: Walker-rolling   Walk 50 feet activity   Assist    Assist level: Contact Guard/Touching assist Assistive device: Walker-rolling    Walk 150 feet activity   Assist    Assist level: Contact Guard/Touching assist Assistive device: Walker-rolling    Walk 10 feet on uneven surface  activity   Assist Walk 10 feet on uneven surfaces activity did not occur: Safety/medical concerns         Wheelchair     Assist Is the patient using a wheelchair?: No             Wheelchair 50 feet with 2 turns activity    Assist            Wheelchair 150 feet activity     Assist          Blood pressure (!) 110/57, pulse 77, temperature 99.3 F (37.4 C), temperature source Oral, resp. rate 17, height '5\' 7"'$  (1.702 m), weight 130.7 kg, SpO2 99 %.  Medical Problem List and Plan: 1. Functional deficits secondary to meningioma s/p craniotomy             -patient may shower             -ELOS/Goals: 7-10 days, supervision PT/OT   2.  Antithrombotics: -DVT/anticoagulation:  Pharmaceutical: Heparin             -antiplatelet therapy: none   3. Pain Management:  Ongoing post-op headache             - Can continue IV toradol for post-op Headache through admission overnight; transition to oral regimen in AM             - Patient endorses nausea/allergy to Vicodin/Norco/Dilaudid - seizures with Morphine - DC and add Gabapentin 300 mg PRN Q8H for pain, HA             - Per chart review ? Allergy to tylenol  - 2/23: Reviewed medications the patient has been able to tolerate, versus recorded allergies.  Adjusted med regimen to gabapentin 200 mg 3 times daily, Tylenol 650 mg scheduled 3 times  daily, with as needed Fioricet for headaches, Toradol for moderate pain, and tramadol for severe pain.   -2-24: Symptoms much improved, continue current regimen.  Gabapentin discontinued due to history of lethargy on this medication.   - 2.25: stable, monitor  4. Mood/Behavior/Sleep: LCSW to evaluate and provide emotional support             -continue Cymbalta 60  mg daily (depressive disorder)             -antipsychotic agents: n/a   5. Neuropsych/cognition: This patient is capable of making decisions on his own behalf.   6. Skin/Wound Care: Routine skin care checks   7. Fluids/Electrolytes/Nutrition: Routine Is and Os and follow-up chemistries -Mild AKI on admission labs with creatinine 1.1, BUN 7, along with hyponatremia 134 from 140.  Initiate IV fluids normal saline at 150 cc/h, 1 L total.  Will repeat labs in a.m. 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 -Hemoglobin stable 2-23, 24   11. Constipation. LBM PTA.              - Colace 100 mg BID + PRN Miralax inpatient; increase to BID miralax, Sennakot-S 2 tabs QHS             - PRN sorbitol, MOM, and Enema; encouraged patient to use a PRN tonight             - LBM 2/22  13.  Orthostatic hypotension, with presyncope -resolved- May be due to dehydration with AKI versus adrenal insufficiency.  Replete IV fluids as above, if no improvement this afternoon will order a.m. cortisol with a.m. labs and depending on result may initiate slower steroid taper. -Symptoms resolved with IV fluids, labs stable, a.m. ordered cortisol 17, within normal limits -Headache regimen as above, much better tolerated today  LOS: 3 days A FACE TO FACE EVALUATION WAS PERFORMED  Gertie Gowda 02/18/2023, 2:21 PM

## 2023-02-18 NOTE — Progress Notes (Signed)
Occupational Therapy Session Note  Patient Details  Name: Francess Cheese MRN: [id number] Date of Birth: 08-07-1971  Today's Date: 02/18/2023 OT Individual Time: 1415-1458 OT Individual Time Calculation (min): 43 min    Short Term Goals: Week 1:  OT Short Term Goal 1 (Week 1): LTG=STG 2/2 ELOS   Skilled Therapeutic Interventions/Progress Updates:    Pt semi reclined in bed, agreeble to OT session.  Pt reports feeling a little nervous walking without device but is willing to keep practicing.  Pt ambulated 95 feet to dayroom without device and CGA.  Pt participated in dynamic standing balance with left, right, and anterior trunk leans outside BOS needing close supervision during bean bag retrieve and toss activity.  Pt ambulated another 95 feet before taking a seated rest break, this time noting increased steadiness.  After short seated break, pt ambulated back to room approximately 75 feet without AD needing close supervision.  Returned to semi reclined in bed, call bell in reach, bed alarm on.  Therapy Documentation Precautions:  Precautions Precautions: Fall Restrictions Weight Bearing Restrictions: No    Therapy/Group: Individual Therapy  Ezekiel Slocumb 02/18/2023, 8:20 AM

## 2023-02-18 NOTE — IPOC Note (Signed)
Overall Plan of Care [facility]) Patient Details Name: Evelyn [name] MRN: [id number] DOB: [birth year]  Admitting Diagnosis: Meningioma, cerebral [facility])  Hospital Problems: Principal Problem:   Meningioma, cerebral ([place])     Functional Problem List: Nursing Pain, Bowel, Perception, Safety, Sensory, Endurance, Medication Management  PT Balance, Endurance, Safety, Skin Integrity  OT Balance, Edema, Endurance, Motor, Perception, Pain, Safety, Vision  SLP    TR         Basic ADL's: OT Grooming, Dressing, Toileting, Bathing, Eating     Advanced  ADL's: OT       Transfers: PT Bed Mobility, Bed to Chair, Car  OT Tub/Shower, Toilet     Locomotion: PT Ambulation, Stairs     Additional Impairments: OT None  SLP        TR      Anticipated Outcomes Item Anticipated Outcome  Self Feeding Independent  Swallowing      Basic self-care  Mod I  Toileting  Mod I   Bathroom Transfers Mod I  Bowel/Bladder  continent B/B  Transfers  mod I  Locomotion  Supervision  Communication     Cognition     Pain  less than 4  Safety/Judgment  remain fall free while in rehab   Therapy Plan: PT Intensity: Minimum of 1-2 x/day ,45 to 90 minutes PT Frequency: 5 out of 7 days PT Duration Estimated Length of Stay: 5-7 days OT Intensity: Minimum of 1-2 x/day, 45 to 90 minutes OT Frequency: 5 out of 7 days OT Duration/Estimated Length of Stay: 5-7 days     Team Interventions: Nursing Interventions Patient/Family Education, Pain Management, Medication Management, Discharge Planning, Bowel Management, Skin Care/Wound Management, Psychosocial Support, Disease Management/Prevention  PT interventions Balance/vestibular training, Ambulation/gait training, Cognitive remediation/compensation, Community reintegration, Discharge planning, DME/adaptive equipment instruction, Functional electrical stimulation, Disease management/prevention, Functional mobility training, Neuromuscular  re-education, Pain management, Psychosocial support, Patient/family education, Skin care/wound management, Stair training, Therapeutic Activities, Therapeutic Exercise, UE/LE Strength taining/ROM, UE/LE Coordination activities, Visual/perceptual remediation/compensation  OT Interventions Balance/vestibular training, Cognitive remediation/compensation, Community reintegration, Disease mangement/prevention, DME/adaptive equipment instruction, Discharge planning, Functional mobility training, Neuromuscular re-education, Functional electrical stimulation, Pain management, Patient/family education, Psychosocial support, Self Care/advanced ADL retraining, Skin care/wound managment, Splinting/orthotics, Therapeutic Activities, Therapeutic Exercise, UE/LE Strength taining/ROM, UE/LE Coordination activities, Visual/perceptual remediation/compensation  SLP Interventions    TR Interventions    SW/CM Interventions Discharge Planning, Psychosocial Support, Patient/Family Education   Barriers to Discharge MD  Medical stability and Weight  Nursing Decreased caregiver support, Neurogenic Bowel & Bladder, Wound Care, Weight 1 level with ramp entry  PT Insurance for SNF coverage, Other (comments) (vision loss)    OT      SLP      SW Insurance for SNF coverage     Team Discharge Planning: Destination: PT-Home ,OT- Home , SLP-  Projected Follow-up: PT-Outpatient PT, 24 hour supervision/assistance, OT-  Outpatient OT, SLP-  Projected Equipment Needs: PT-To be determined, OT- To be determined, SLP-  Equipment Details: PT- , OT-likely none Patient/family involved in discharge planning: PT- Patient, Family member/caregiver,  OT-Patient, Family member/caregiver, SLP-   MD ELOS: 7-10 days  Medical Rehab Prognosis:  Good Assessment: The patient has been admitted for CIR therapies with the diagnosis of meningioma s/p craniotomy. The team will be addressing functional mobility, strength, stamina, balance, safety,  adaptive techniques and equipment, self-care, bowel and bladder mgt, patient and caregiver education. Goals have been set at sup Pt/OT. Anticipated discharge destination is home.  See Team Conference Notes for weekly updates to the plan of care

## 2023-02-19 ENCOUNTER — Inpatient Hospital Stay: Payer: Medicaid Other | Attending: Neurological Surgery

## 2023-02-19 ENCOUNTER — Other Ambulatory Visit: Payer: Self-pay | Admitting: Radiology

## 2023-02-19 DIAGNOSIS — Z803 Family history of malignant neoplasm of breast: Secondary | ICD-10-CM

## 2023-02-19 LAB — BASIC METABOLIC PANEL
Anion gap: 12 (ref 5–15)
BUN: 6 mg/dL (ref 6–20)
CO2: 21 mmol/L — ABNORMAL LOW (ref 22–32)
Calcium: 8.7 mg/dL — ABNORMAL LOW (ref 8.9–10.3)
Chloride: 103 mmol/L (ref 98–111)
Creatinine, Ser: 0.8 mg/dL (ref 0.44–1.00)
GFR, Estimated: >60 mL/min (ref >60)
Glucose, Bld: 87 mg/dL (ref 70–99)
Potassium: 4.7 mmol/L (ref 3.5–5.1)
Sodium: 136 mmol/L (ref 135–145)

## 2023-02-19 LAB — CBC
HCT: 28.2 % — ABNORMAL LOW (ref 36.0–46.0)
Hemoglobin: 8.8 g/dL — ABNORMAL LOW (ref 12.0–15.0)
MCH: 26 pg (ref 26.0–34.0)
MCHC: 31.2 g/dL (ref 30.0–36.0)
MCV: 83.2 fL (ref 80.0–100.0)
Platelets: 333 10*3/uL (ref 150–400)
RBC: 3.39 MIL/uL — ABNORMAL LOW (ref 3.87–5.11)
RDW: 14.2 % (ref 11.5–15.5)
WBC: 4.7 10*3/uL (ref 4.0–10.5)
nRBC: 0 % (ref 0.0–0.2)

## 2023-02-19 MED ORDER — TRAMADOL HCL 50 MG PO TABS
25.0000 mg | ORAL_TABLET | Freq: Four times a day (QID) | ORAL | Status: DC | PRN
  Administered 2023-02-20 – 2023-02-23 (×3): 50 mg via ORAL
  Filled 2023-02-19 (×3): qty 1

## 2023-02-19 NOTE — Progress Notes (Signed)
Physical Therapy Session Note  Patient Details  Name: Evelyn [name] MRN: [id number] Date of Birth: 09/12/1971  Today's Date: 02/19/2023 PT Individual Time: 1st Treatment Session: 0800-0830; 2nd Treatment Session: 1300-1415 PT Individual Time Calculation (min): 30 min; 75 min  Short Term Goals: Week 1:  PT Short Term Goal 1 (Week 1): STG = LTG due to ELOS  Skilled Therapeutic Interventions/Progress Updates:  1st Treatment Session- Patient greeted semi-reclined in bed with mother present and agreeable to PT treatment session. Patient transitioned from semi-reclined to sitting EOB ModI. Patient stood from EOB with RW and Supv- Upon standing, therapist took away the rolling walker in order to further challenge her balance. Patient ambulated from her room to rehab gym without AD and SBA/Supv for safety.   Patient stood and performed alternating foot taps to 6" step, x10 without UE support and Supv.   Patient stood on airex foam and performed alteranting foot taps to 6" step with Supv. 4# weights added to B LE and patient performed x20 alternating foot taps with SBA.   Patient stepped onto and off of the airex foam (forward), turned around and then stepped onto/off of the foam, x6 trials total with 4# weights and SBA for safety.   Patient performed lateral stepping onto/off of airex foam with 4# weights on B LE and SBA for safety- VC for decreased cadence with significant improvements noted in overall stability.   Patient ambulated back to her room with 4# weights donned to B LE and SBA for safety- Patient demonstrated one minor LOB, however was able to appropriately utilize her stepping strategy. Patient left sitting EOB with mother present, call bell within reach and all needs met.    2nd Treatment Session- Patient greeted semi-reclined in bed and agreeable to PT treatment session- Patient transitioned to sitting EOB independently where she was able to don shoes with set-up assistance.  Patient stood from EOB with SBA and gait trained to day room gym without the use of an AD and SBA for safety.   Patient tasked with performing alternating toe taps to a cone, then side-stepping over a hurdle, onto airex foam, over another hurdle and toe tapping another cone- Patient performed x2 in each direction with CGA/SBA for safety and no notable LOB.   Patient gait trained x190' without the use of an AD and SBA for safety- 4# weights donned to B LE throughout gait trial. Patient required VC for forward gaze throughout gait trial with increased difficulty in balance noted when looking up compared to at her feet.   Patient then gait trained another x190', however without weights donned to B LE. Patient was able to demonstrate improved cadence and ease throughout gait trial and no LOB noted.   Patient ambulated >300' without AD and SBA/Supv while tasked with naming various animals- Patient was able to demonstrate improved cognitive dual-tasking with repetition.   Patient weaved between x10 cones, x2 trials without the use of an AD and SBA for safety with only one one knocked over.   Patient performed the same as above, however was tasked with naming breakfast items while weaving through the cones- Patient demonstrated improved fluidity with gait throughout this trial with no LOB noted.   Patient gait trained x5 minutes around the nurse's station without the use of an AD and supv- While ambulating patient was tasked with vairous cognitive stasks in order ot decrease her attention from her gait.   Patient ambulated to her room with supv and was able  to toilet in her room with supv. Patient left sitting EOB with call bell within reach, mother present and all needs met.    Therapy Documentation Precautions:  Precautions Precautions: Fall Restrictions Weight Bearing Restrictions: No  Therapy/Group: Individual Therapy  Doloras Tellado 02/19/2023, 7:54 AM

## 2023-02-19 NOTE — Progress Notes (Signed)
Occupational Therapy Session Note  Patient Details  Name: Evelyn [name] MRN: [id number] Date of Birth: 12-11-1971  Today's Date: 02/19/2023 OT Individual Time: 1505-1530 OT Individual Time Calculation (min): 25 min   Short Term Goals: Week 1:  OT Short Term Goal 1 (Week 1): LTG=STG 2/2 ELOS  Skilled Therapeutic Interventions/Progress Updates:  Pt received resting in bed for skilled OT session with focus on IADL retraining, vision, and general conditioning. Pt agreeable to interventions, demonstrating overall pleasant mood. Pt with no reports of pain. OT offering intermediate rest breaks and positioning suggestions throughout session to address pain/fatigue and maximize participation/safety in session.   Pt ambulates to all therapy locations with close supervision + no AD. In simulated apartment environment, pt simulates simple meal prep activity with supervision. Pt and OT discuss uncle's home kitchen layout, as patient plans to DC to this location, reviewing energy conservation techniques.  Pt shares bright light increases the intensity of her headaches. Education on dimming screens and/or switching to "night shift" mode (a warmer light) for decreased eye fatigue provided. Pt issued sunglasses for use in therapy gyms with bright lighting, pt voicing some discomfort around incision but able to tolerate for ~10 mins.   Pt then performs 1 set/10 reps of torso twists and overhead diagonal reaches with 5# medicine ball for general conditioning.   Pt remained resting in bed with all immediate needs met at end of session. Pt continues to be appropriate for skilled OT intervention to promote further functional independence.   Therapy Documentation Precautions:  Precautions Precautions: Fall Restrictions Weight Bearing Restrictions: No  Therapy/Group: Individual Therapy  Maudie Mercury, OTR/L, MSOT  02/19/2023, 6:32 AM

## 2023-02-19 NOTE — Progress Notes (Signed)
Occupational Therapy Session Note  Patient Details  Name: Evelyn [name] MRN: [id number] Date of Birth: 11-10-1971  Today's Date: 02/19/2023 OT Individual Time: [id number] OT Individual Time Calculation (min): 73 min    Short Term Goals: Week 1:  OT Short Term Goal 1 (Week 1): LTG=STG 2/2 ELOS  Skilled Therapeutic Interventions/Progress Updates:    Pt greeted semi-reclined in bed and reported more of a headache today. Pt had already received pain meds. Pt reported need to go to the bathroom, but did not want to shower. Pt ambulated with RW and close supervision to transition into bathroom. Pt voided bladder and completed 3/3 toileting tasks w/ supervision. Functional ambulation to therapy ortho gym without AD and CGA. Addressed standing balance/endurance and UB there-ex using 4 lb solo weights. Pt completed 3 sets of 5 curl to press in sitting position, then 3 sets of 5 curl to press in standing position on foam block. 2 sets of 10 overhead tricps press in sitting and standing on foam block. CGA for balance and seated rest breaks in between. Pt issued handout regarding Yoga practice. Pt brought into quadruped on therapy mat and went through deep breathing, cat/cow positions, and streatching in kneeling. Addressed visual scanning to the L with BITS activty. Focus on tracking with eyes an peripheral vision without head turns. Addressed hip strength and balance with side stepping along line on the floor with facilitation and cues for weight shift and to take larger steps for increased balance challenge. Pt ambulated back to room without AD and CGA. Pt transitioned into bathroom and nurse tech informed of patient status.  Therapy Documentation Precautions:  Precautions Precautions: Fall Restrictions Weight Bearing Restrictions: No Pain: Pain Assessment Pain Scale: 0-10 Pain Score: 8  Pain Type: Surgical pain Pain Location: Head Pain Orientation: Left;Anterior Pain Descriptors /  Indicators: Headache Pain Frequency: Constant Pain Intervention(s): Medication (See eMAR) :     Therapy/Group: Individual Therapy  Valma Cava 02/19/2023, 11:25 AM

## 2023-02-19 NOTE — Progress Notes (Signed)
PROGRESS NOTE   Subjective/Complaints:  No acute complaints.  No events overnight.  Headache remains intermittent, but patient says it is well-controlled on her current regimen, does not want any medications adjusted.  She does continue to have worsening of her headache with EOMI, especially looking upward, which causes some proptosis in her left eye.  Vitals and labs stable this a.m.  ROS: +  headache-much improved.  No abdominal pain, constipation, diarrhea, SOB, cough, chest pain, new weakness or paraesthesias.    Objective:   No results found. Recent Labs    02/17/23 0731 02/19/23 0654  WBC 7.5 4.7  HGB 8.9* 8.8*  HCT 28.4* 28.2*  PLT 323 333    Recent Labs    02/17/23 0731 02/19/23 0654  NA 134* 136  K 3.7 4.7  CL 100 103  CO2 21* 21*  GLUCOSE 164* 87  BUN 9 6  CREATININE 1.00 0.80  CALCIUM 8.5* 8.7*     Intake/Output Summary (Last 24 hours) at 02/19/2023 0951 Last data filed at 02/19/2023 0700 Gross per 24 hour  Intake 830 ml  Output --  Net 830 ml         Physical Exam: Vital Signs Blood pressure 116/70, pulse 79, temperature 98.3 F (36.8 C), temperature source Oral, resp. rate 18, height '5\' 7"'$  (1.702 m), weight 130.7 kg, SpO2 97 %.  Physical Exam Constitutional: Appropriate appearance for age.  No acute distress.  +Obese HENT: No JVD. Neck Supple. Trachea midline. + Left craniotomy  Eyes: PERRLA. EOMI. +Discomfort with superior gaze.  Mild nystagmus worse on right than left gaze, upward gaze causes headache and induces ptosis on the left eye. Cardiovascular: RRR, no murmurs/rub/gallops.  Peripheral pulses 2+  Respiratory: CTAB. No rales, rhonchi, or wheezing. On RA.  Abdomen: + bowel sounds, normoactive. No distention or tenderness.  Skin: + L craniotomy site with absorbable sutures, site c/d/I, significant localized swelling  - area on most inferior incision line of increased swelling,  without tension, compressible and without warmth -decrease from yesterday  MSK:      No apparent deformity.      Strength: Moving all 4 limbs antigravity and against resistance.  Neurologic exam:  Cognition: AAO to person, place, time and event.  No new deficits appreciated from prior exam. Language: Fluent, No substitutions or neoglisms. No dysarthria.  Insight: Good insight into current condition.  Mood: Pleasant affect, appropriate mood.  CN: + L V1 sensory deficit -improved;  + L ptosis (? C/b periorbital edema)-improving daily, but ongoing Coordination: No apparent tremors. + Mild LUE ataxia Spasticity: MAS 0 in all extremities.  Balance: Supervision with use of walker for single stance balance exercises with PT     Assessment/Plan: 1. Functional deficits which require 3+ hours per day of interdisciplinary therapy in a comprehensive inpatient rehab setting. Physiatrist is providing close team supervision and 24 hour management of active medical problems listed below. Physiatrist and rehab team continue to assess barriers to discharge/monitor patient progress toward functional and medical goals  Care Tool:  Bathing    Body parts bathed by patient: Right arm, Left arm, Chest, Front perineal area, Abdomen, Buttocks, Right upper leg, Left upper leg,  Right lower leg, Left lower leg, Face         Bathing assist Assist Level: Contact Guard/Touching assist     Upper Body Dressing/Undressing Upper body dressing   What is the patient wearing?: Pull over shirt    Upper body assist Assist Level: Supervision/Verbal cueing    Lower Body Dressing/Undressing Lower body dressing      What is the patient wearing?: Underwear/pull up, Pants     Lower body assist Assist for lower body dressing: Contact Guard/Touching assist     Toileting Toileting    Toileting assist Assist for toileting: Contact Guard/Touching assist     Transfers Chair/bed transfer  Transfers assist      Chair/bed transfer assist level: Contact Guard/Touching assist     Locomotion Ambulation   Ambulation assist      Assist level: Contact Guard/Touching assist Assistive device: Walker-rolling Max distance: 200   Walk 10 feet activity   Assist     Assist level: Contact Guard/Touching assist Assistive device: Walker-rolling   Walk 50 feet activity   Assist    Assist level: Contact Guard/Touching assist Assistive device: Walker-rolling    Walk 150 feet activity   Assist    Assist level: Contact Guard/Touching assist Assistive device: Walker-rolling    Walk 10 feet on uneven surface  activity   Assist Walk 10 feet on uneven surfaces activity did not occur: Safety/medical concerns         Wheelchair     Assist Is the patient using a wheelchair?: No             Wheelchair 50 feet with 2 turns activity    Assist            Wheelchair 150 feet activity     Assist          Blood pressure 116/70, pulse 79, temperature 98.3 F (36.8 C), temperature source Oral, resp. rate 18, height '5\' 7"'$  (1.702 m), weight 130.7 kg, SpO2 97 %.  Medical Problem List and Plan: 1. Functional deficits secondary to meningioma s/p craniotomy             -patient may shower             -ELOS/Goals: 7-10 days, supervision PT/OT   2.  Antithrombotics: -DVT/anticoagulation:  Pharmaceutical: Heparin             -antiplatelet therapy: none   3. Pain Management:  Ongoing post-op headache             - Can continue IV toradol for post-op Headache through admission overnight; transition to oral regimen in AM             - Patient endorses nausea/allergy to Vicodin/Norco/Dilaudid - seizures with Morphine - DC and add Gabapentin 300 mg PRN Q8H for pain, HA             - Per chart review ? Allergy to tylenol  - 2/23: Reviewed medications the patient has been able to tolerate, versus recorded allergies.  Adjusted med regimen to gabapentin 200 mg 3 times  daily, Tylenol 650 mg scheduled 3 times daily, with as needed Fioricet for headaches, Toradol for moderate pain, and tramadol for severe pain.   -2-24: Symptoms much improved, continue current regimen.  Gabapentin discontinued due to history of lethargy on this medication.   - 2.25-26: stable, monitor  4. Mood/Behavior/Sleep: LCSW to evaluate and provide emotional support             -  continue Cymbalta 60 mg daily (depressive disorder)             -antipsychotic agents: n/a   5. Neuropsych/cognition: This patient is capable of making decisions on his own behalf.   6. Skin/Wound Care: Routine skin care checks   7. Fluids/Electrolytes/Nutrition: Routine Is and Os and follow-up chemistries -Mild AKI on admission labs with creatinine 1.1, BUN 7, along with hyponatremia 134 from 140.  Initiate IV fluids normal saline at 150 cc/h, 1 L total.  Will repeat labs in a.m. 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 -Hemoglobin stable 2-23, 24, 26   11. Constipation. LBM PTA.              - Colace 100 mg BID + PRN Miralax inpatient; increase to BID miralax, Sennakot-S 2 tabs QHS             - PRN sorbitol, MOM, and Enema; encouraged patient to use a PRN tonight             - LBM 2/22  13.  Orthostatic hypotension, with presyncope -resolved- May be due to dehydration with AKI versus adrenal insufficiency.  Replete IV fluids as above, if no improvement this afternoon will order a.m. cortisol with a.m. labs and depending on result may initiate slower steroid taper. -Symptoms resolved with IV fluids, labs stable, a.m. ordered cortisol 17, within normal limits -Headache regimen as above, much better tolerated today  LOS: 4 days A FACE TO FACE EVALUATION WAS PERFORMED  Gertie Gowda 02/19/2023, 9:51 AM

## 2023-02-19 NOTE — Progress Notes (Signed)
Radiation Oncology         [phone] ________________________________  Initial Inpatient Consultation  Name: Evelyn [name] MRN: [id number]  Date: 02/20/2023  DOB: 03-11-1971  CC:Loyola Mast, PA-C  Loyola Mast, PA-C   REFERRING PHYSICIAN: Daisy Lazar A, PA-C  DIAGNOSIS: No diagnosis found.  WHO grade 2 atypical meningioma: s/p craniotomy with resectioning    Cancer Staging  No matching staging information was found for the patient.  CHIEF COMPLAINT: Here to discuss management of meningioma  HISTORY OF PRESENT ILLNESS::Evelyn [name] is a 52 y.o. female who presented to the ED on 12/20/22 with c/o of recurrent and increasing episodes of near syncope/blackout spells over the last year. Per encounter notes, the patient characterized that she would be completely fine, and then suddenly begin to stare off into space and become non-responsive. She denied any falls or seizure like activity. She also noted a history of migraines when she was younger, and a recent resurgence of her migrainous type headaches, which did not always coincide with her blackout episodes. ED work-up was unremarkable, and she was discharged with a referral placed to neurology for further evaluation.   The patient accordingly met with Neurology on 01/03/23. During this visit, the patient also endorsed recurrent headaches. In light of this, an MRI of the brain was ordered and the patient was started on Zonisamide.   Subsequent MRI of the brain on 01/17/23 demonstrated a large left orbital frontal meningeal base mass measuring 5.6 x 5.5 x 2.8 cm, with intense enhancement, mild cytotoxic edema, and left-to-right brain herniation, like representing an orbitofrontal meningioma.   Subsequently, the patient was referred to neurosurgery and was admitted on 02/08/23 for craniotomy for tumor resection under the care of Dr. Zada Finders. Pathology from the procedure revealed WHO grade 2 atypical meningioma.    Post-op MRI of the brain on 02/08/23 showed: decreased mass effect on the left frontal lobe, improvement in size of the left lateral ventricle, and a residual 5 mm left to right midline shift (previously 9 mm when remeasured similarly). MRI also showed a 6 mm extra-axial fluid collection along the left frontal convexity located subjacent to the craniotomy flap.   The patient was started on keppra following her procedure, and she was admitted to inpatient rehab on 02/15/23. Her main complaints at this time include decrease vision acuity of left eye due to swelling and myopia, as well as constipation, nausea, and dizziness due to hydrocodone (which has since been discontinued). She is currently receiving toradol for her headaches.  PREVIOUS RADIATION THERAPY: No  PAST MEDICAL HISTORY:  has a past medical history of Anemia, Anxiety, Depression, GERD (gastroesophageal reflux disease), Headache, Obesity, Seizures ([place]), and Sleep apnea.    PAST SURGICAL HISTORY: Past Surgical History:  Procedure Laterality Date   ABDOMINAL HYSTERECTOMY     APPLICATION OF CRANIAL NAVIGATION N/A 02/08/2023   Procedure: APPLICATION OF CRANIAL NAVIGATION;  Surgeon: Judith Part, MD;  Location: [place];  Service: Neurosurgery;  Laterality: N/A;   BREAST LUMPECTOMY Left 1997   CHOLECYSTECTOMY     CRANIOTOMY Left 02/08/2023   Procedure: Left craniotomy for tumor resection with stealth navigation;  Surgeon: Judith Part, MD;  Location: [facility];  Service: Neurosurgery;  Laterality: Left;   GASTRIC BYPASS  03/2017   THROAT SURGERY     removed scar tissue from throat d/t being intubated.    TUBAL LIGATION      FAMILY HISTORY: family history includes Breast cancer in her paternal  aunt and paternal grandmother; Breast cancer (age of onset: 56) in her mother.  SOCIAL HISTORY:  reports that she has never smoked. She has never used smokeless tobacco. She reports that she does not drink alcohol and does not use  drugs.  ALLERGIES: Dilaudid [hydromorphone hcl], Morphine and related, and Vicodin [hydrocodone-acetaminophen]  MEDICATIONS:  No current facility-administered medications for this encounter.   No current outpatient medications on file.   Facility-Administered Medications Ordered in Other Encounters  Medication Dose Route Frequency Provider Last Rate Last Admin   acetaminophen (TYLENOL) tablet 650 mg  650 mg Oral TID Durel Salts C, DO   650 mg at 02/19/23 1539   alum & mag hydroxide-simeth (MAALOX/MYLANTA) 200-200-20 MG/5ML suspension 30 mL  30 mL Oral Q4H PRN Barbie Banner, PA-C       butalbital-acetaminophen-caffeine (FIORICET) [phone] MG per tablet 1 tablet  1 tablet Oral Q6H PRN Durel Salts C, DO   1 tablet at 02/19/23 1846   DULoxetine (CYMBALTA) DR capsule 60 mg  60 mg Oral Daily Barbie Banner, PA-C   60 mg at 02/19/23 0746   famotidine (PEPCID) tablet 20 mg  20 mg Oral BID Barbie Banner, PA-C   20 mg at 02/19/23 0747   guaiFENesin-dextromethorphan (ROBITUSSIN DM) 100-10 MG/5ML syrup 5-10 mL  5-10 mL Oral Q6H PRN Barbie Banner, PA-C       heparin injection 5,000 Units  5,000 Units Subcutaneous Q8H Barbie Banner, PA-C   5,000 Units at 02/19/23 1539   levETIRAcetam (KEPPRA) tablet 750 mg  750 mg Oral BID Barbie Banner, PA-C   750 mg at 02/19/23 [id number]   magnesium citrate solution 1 Bottle  1 Bottle Oral Once PRN Barbie Banner, PA-C       methocarbamol (ROBAXIN) tablet 500 mg  500 mg Oral Q6H PRN Barbie Banner, PA-C   500 mg at 02/16/23 0809   polyethylene glycol (MIRALAX / GLYCOLAX) packet 17 g  17 g Oral Daily PRN Barbie Banner, PA-C       polyethylene glycol (MIRALAX / GLYCOLAX) packet 17 g  17 g Oral BID Barbie Banner, PA-C   17 g at 02/19/23 [id number]   prochlorperazine (COMPAZINE) tablet 5-10 mg  5-10 mg Oral Q6H PRN Barbie Banner, PA-C       Or   prochlorperazine (COMPAZINE) injection 5-10 mg  5-10 mg Intramuscular Q6H PRN Barbie Banner, PA-C        Or   prochlorperazine (COMPAZINE) suppository 12.5 mg  12.5 mg Rectal Q6H PRN Setzer, Edman Circle, PA-C       senna-docusate (Senokot-S) tablet 2 tablet  2 tablet Oral BID Durel Salts C, DO   2 tablet at 02/19/23 0747   sorbitol 70 % solution 30 mL  30 mL Oral Daily PRN Barbie Banner, PA-C       traMADol Veatrice Bourbon) tablet 25-50 mg  25-50 mg Oral Q6H PRN Durel Salts C, DO       traZODone (DESYREL) tablet 25-50 mg  25-50 mg Oral QHS PRN Barbie Banner, PA-C   50 mg at 02/17/23 2027    REVIEW OF SYSTEMS:  Notable for that above.   PHYSICAL EXAM:  vitals were not taken for this visit.   General: Alert and oriented, in no acute distress *** HEENT: Head is normocephalic. Extraocular movements are intact. Oropharynx is clear. Neck: Neck is supple, no palpable cervical or supraclavicular lymphadenopathy. Heart: Regular in rate and rhythm with no  murmurs, rubs, or gallops. Chest: Clear to auscultation bilaterally, with no rhonchi, wheezes, or rales. Abdomen: Soft, nontender, nondistended, with no rigidity or guarding. Extremities: No cyanosis or edema. Lymphatics: see Neck Exam Skin: No concerning lesions. Musculoskeletal: symmetric strength and muscle tone throughout. Neurologic: Cranial nerves II through XII are grossly intact. No obvious focalities. Speech is fluent. Coordination is intact. Psychiatric: Judgment and insight are intact. Affect is appropriate.   ECOG = ***  0 - Asymptomatic (Fully active, able to carry on all predisease activities without restriction)  1 - Symptomatic but completely ambulatory (Restricted in physically strenuous activity but ambulatory and able to carry out work of a light or sedentary nature. For example, light housework, office work)  2 - Symptomatic, <50% in bed during the day (Ambulatory and capable of all self care but unable to carry out any work activities. Up and about more than 50% of waking hours)  3 - Symptomatic, >50% in bed, but not  bedbound (Capable of only limited self-care, confined to bed or chair 50% or more of waking hours)  4 - Bedbound (Completely disabled. Cannot carry on any self-care. Totally confined to bed or chair)  5 - Death   Eustace Pen MM, Creech RH, Tormey DC, et al. [phone]). "Toxicity and response criteria of the [facility]". [place] Oncol. 5 (6): 649-55   LABORATORY DATA:  Lab Results  Component Value Date   WBC 4.7 02/19/2023   HGB 8.8 (L) 02/19/2023   HCT 28.2 (L) 02/19/2023   MCV 83.2 02/19/2023   PLT 333 02/19/2023   CMP     Component Value Date/Time   NA 136 02/19/2023 0654   K 4.7 02/19/2023 0654   CL 103 02/19/2023 0654   CO2 21 (L) 02/19/2023 0654   GLUCOSE 87 02/19/2023 0654   BUN 6 02/19/2023 0654   CREATININE 0.80 02/19/2023 0654   CALCIUM 8.7 (L) 02/19/2023 0654   PROT 6.3 (L) 02/16/2023 0801   ALBUMIN 2.5 (L) 02/16/2023 0801   AST 30 02/16/2023 0801   ALT 21 02/16/2023 0801   ALKPHOS 55 02/16/2023 0801   BILITOT 0.3 02/16/2023 0801   GFRNONAA >60 02/19/2023 0654   GFRAA >60 12/07/2019 2340         RADIOGRAPHY: MR BRAIN W WO CONTRAST  Result Date: 02/09/2023 CLINICAL DATA:  CNS neoplasm, assess treatment response EXAM: MRI HEAD WITHOUT AND WITH CONTRAST TECHNIQUE: Multiplanar, multiecho pulse sequences of the brain and surrounding structures were obtained without and with intravenous contrast. CONTRAST:  39m GADAVIST GADOBUTROL 1 MMOL/ML IV SOLN COMPARISON:  01/17/2023 FINDINGS: Brain: Status post interval left frontal craniotomy and resection of a previously noted left anterior cranial fossa mass. Restricted diffusion is noted surrounding the resection cavity along the anterior left frontal lobe, not unexpected post surgically; no distal restricted diffusion to suggest additional acute infarct. Fluid, blood products, and air are noted in the resection cavity, with a 6 mm extra-axial fluid collection along the left frontal convexity, subjacent to  the craniotomy flap (series 14, image 13). Decreased mass effect on the left frontal lobe, with improved size of the left lateral ventricle and 5 mm of residual left to right midline shift, previously 9 mm when remeasured similarly. Persistent increased T2 signal in the left frontal lobe, likely edema. Partial empty sella, with expansion of the sella, similar to prior. Vascular: Normal arterial flow voids. Skull and upper cervical spine: Left frontotemporal craniotomy. Fluid and air in the subcutaneous tissues overlying the left  aspect of the calvarium. Sinuses/Orbits: Mild mucosal thickening in the paranasal sinuses. No acute finding in the orbits. Other: The mastoids are well aerated. IMPRESSION: 1. Status post interval left frontal craniotomy and resection of a previously noted left anterior cranial fossa mass. Decreased mass effect on the left frontal lobe, with improved size of the left lateral ventricle and 5 mm of residual left to right midline shift, previously 9 mm when remeasured similarly. 2. 6 mm extra-axial fluid collection along the left frontal convexity, subjacent to the craniotomy flap, none expected postoperatively. Electronically Signed   By: Merilyn Baba M.D.   On: 02/09/2023 02:37      IMPRESSION/PLAN:***    On date of service, in total, I spent *** minutes on this encounter. Patient was seen in person.   __________________________________________   Eppie Gibson, MD  This document serves as a record of services personally performed by Eppie Gibson, MD. It was created on her behalf by Roney Mans, a trained medical scribe. The creation of this record is based on the scribe's personal observations and the provider's statements to them. This document has been checked and approved by the attending provider.

## 2023-02-20 ENCOUNTER — Ambulatory Visit
Admit: 2023-02-20 | Discharge: 2023-02-20 | Disposition: A | Discharge status: Home / Self Care | Payer: Medicaid Other | Attending: Radiation Oncology | Admitting: Radiation Oncology

## 2023-02-20 DIAGNOSIS — D420 Neoplasm of uncertain behavior of cerebral meninges: Secondary | ICD-10-CM

## 2023-02-20 DIAGNOSIS — D320 Benign neoplasm of cerebral meninges: Secondary | ICD-10-CM

## 2023-02-20 MED ORDER — DICLOFENAC SODIUM 1 % EX GEL
2.0000 g | Freq: Two times a day (BID) | CUTANEOUS | Status: DC
  Administered 2023-02-20 – 2023-02-23 (×6): 2 g via TOPICAL
  Filled 2023-02-20 (×2): qty 100

## 2023-02-20 NOTE — Progress Notes (Signed)
Occupational Therapy Note  Patient Details  Name: Evelyn [name] MRN: [id number] Date of Birth: 05-Jan-1971  Today's Date: 02/20/2023 OT Missed Time: 41 Minutes Missed Time Reason: Patient fatigue  Attempted to see patient 2x for OT treatment session. Both times patient was asleep. Pt able to wake slightly and stated she did not feel well. Pt requested to rest due to fatigue and not feeling well. OT to follow up per plan of care.    Daneen Schick Jene Huq 02/20/2023, 11:49 AM

## 2023-02-20 NOTE — Progress Notes (Signed)
Patient ID: Evelyn [name], female   DOB: [birth year], 52 y.o.   MRN: [id number]  SW met with pt and pt mother in room to provide updates from team conference on gains made,and d/c date 3/2. Pt aware of upcoming appt this afternoon with [place]. Pt aware SW will follow-up with her son. Pt asked SW to call her aunt Ruby.   89- SW spoke with pt son Karle Starch and his wife to discuss above. Reports there is now a housing issue in which she is unable to go to her uncle's home due to his wife having an upcoming surgery. SW discussed primary location, and informed this does not appear to be an option either. SW shared housing options such as placing self on shelter waiting lists, and encouraged to follow-up with [employer] to see if [employer]/Housing is accepting any new applications.   1545- SW left message for pt aunt Bertram Millard requesting return phone call.   Loralee Pacas, MSW, [place] Office: [phone] Cell: [phone] Fax: [phone]

## 2023-02-20 NOTE — Progress Notes (Signed)
Physical Therapy Session Note  Patient Details  Name: Evelyn [name] MRN: [id number] Date of Birth: 07/08/1971  Today's Date: 02/20/2023 PT Individual Time: 1st Treatment Session: 0900-1000; 2nd Treatment Session: Missed 30 minutes of PT treatment session, secondary to severe headache.  PT Individual Time Calculation (min): 60 min; 0 min  Short Term Goals: Week 1:  PT Short Term Goal 1 (Week 1): STG = LTG due to ELOS  Skilled Therapeutic Interventions/Progress Updates:  1st Treatment Session- Patient greeted semi-reclined in the bed and reporting headache, however received pain medications ten minutes prior to therapist's arrival. Patient agreeable to participating in treatment session, however educated that if the pain becomes too much we can go back to her room.   While sitting EOB, patient donned shoes and stood without the use of an AD and supv. Patient ambulated from her room to day room with supv and decreased cadence secondary to headache.   Patient agreeable to performing full-body therex and preferred to sit edge of mat table in order to complete circuit. Therex performed in order to increased B UE/LE and core strength for improved functional mobility- B LAQ with 4#, 2 x 10  B hip flexion (marches) with 4#, 2 x 10  B bicep curls with 5#, 2 x 10  B chest press with 5#, 2 x 10  B overhead press with 5#, 2 x 10 Mini sit-ups with 5#, 2 x 10   Patient gait trained x190' without the use of an AD and supv- Patient reported gait trial increased pain and would prefer to not address ambulation at this time. Gait training discontinued secondary to headache. Patient agreeable to seated activities at this time.   Patient tasked with locating squigz in left periphery while staring straight ahead- Therapist brought x15 various colored squigz from behind her head and into her L periphery. Patient tasked identifying the color and reaching out with her L hand to grab squigz while still  maintaining forward gaze. Patient was 90% accurate when item was at a 45 degree angle, however unable to identify items further our in her L periphery.   Patient ambulated back to her room with supv and left sitting on the toilet with NT aware and agreeable to assist when patient pulls the cord.    2nd Treatment Session- Patient greeted supine asleep in bed with mother present reporting patient has not been feeling well today and her head has been bothering her since earlier treatment session. RN notified and headed into patient's room to administer medication. Patient missed 30 minutes of scheduled PT treatment session secondary to severe headache.    Therapy Documentation Precautions:  Precautions Precautions: Fall Restrictions Weight Bearing Restrictions: No   Therapy/Group: Individual Therapy  Sadye Kiernan 02/20/2023, 7:47 AM

## 2023-02-20 NOTE — Progress Notes (Signed)
Occupational Therapy Session Note  Patient Details  Name: Evelyn [name] MRN: [id number] Date of Birth: November 04, 1971  Today's Date: 02/20/2023 Session 1 OT Individual Time: [id number] OT Individual Time Calculation (min): 58 min   Short Term Goals: Week 1:  OT Short Term Goal 1 (Week 1): LTG=STG 2/2 ELOS  Skilled Therapeutic Interventions/Progress Updates:    Session 1 Pt greeted semi-reclined in bed awake. Pt reported mild headache, but agreeable to OT treatment session. Pt needed a little time to wake, but ready to shower. Pt needed cues to not furniture walk when ambulating in room with AD. Close supervision. Pt doffed clothing from tub bench in shower. Bathing completed sit<>stand from tub bench with supervision overall and no overt LOB. OT educated on safety techniques to decrease falls risk. Dressing tasks completed from EOB with supervision and min cues for modified techniques. Sit<>stands at EOB with supervision. Pt tolerated stanidng for 3 minutes while brushing teeth w/ supervision and no LOB. Pt reported headache 6/10 and requested meds from nursing. Nursing notified. Pt left semi-reclined in bed at end of session with mother present and needs met.  Therapy Documentation Precautions:  Precautions Precautions: Fall Restrictions Weight Bearing Restrictions: No Pain:  6/10 headache. Nursing notified on need for pain meds   Therapy/Group: Individual Therapy  Valma Cava 02/20/2023, 8:02 AM

## 2023-02-20 NOTE — Patient Care Conference (Signed)
Inpatient RehabilitationTeam Conference and Plan of Care Update Date: 02/20/2023   Time: 10:41 AM    Patient Name: Evelyn [name]      Medical Record Number: [id number]  Date of Birth: 1971-05-14 Sex: Female         Room/Bed: 4W13C/4W13C-01 Payor Info: Payor: [place] / Plan: [place] MEDICAID [place] / Product Type: *No Product type* /    Admit Date/Time:  02/15/2023  5:36 PM  Primary Diagnosis:  Meningioma, cerebral [facility])  Hospital Problems: Principal Problem:   Meningioma, cerebral [facility])    Expected Discharge Date: Expected Discharge Date: 02/24/23  Team Members Present: Physician leading conference: Dr. Durel Salts Social Worker Present: Loralee Pacas, [employer] Nurse Present: Tacy Learn, RN PT Present: Terence Lux, PT OT Present: Cherylynn Ridges, OT PPS Coordinator present : Gunnar Fusi, SLP     Current Status/Progress Goal Weekly Team Focus  Bowel/Bladder   Pt continent B/B  LBM 02/19/23   Pt will maintain nromal B/B pattern   Toilet qshift/prn    Swallow/Nutrition/ Hydration               ADL's   CLose supervision ambulation in room, supervision BADL tasks   Mod I   self-care retraining, visual scanning, balance, functional ambulation, dc planning    Mobility   Independent for bed mobility; SBA/Supv for transfers and gait up to 150+ feet; CGA for stair mobility- Limited by endurance/activity tolerance and balance deficits.   Supv/ModI with LRAD  Dynamic stability, endurance/activity tolerance, gait, transfers, stair mobility, discharge planning, education, etc.    Communication                Safety/Cognition/ Behavioral Observations               Pain   Verbalizes pain level of 8 on 0-10 pain scale to anterior left head. Pain well managed with current prn pain medications   Will verbalize pain level <3   Assess pt for pain qshift/prn and administer prn pain medication and effectiveness     Skin   Surgical incision to left anterior head. (open to air)   Pt will be free from infection and maintain skin intergrity  Assess skin qshift/prn for infection and promote healing      Discharge Planning:  D/c to her uncle's home, and will have support from her mother, and PRN support from her children.   Team Discussion: Cerebra Meningioma. Continent B/B with constipation. Did have good BM today. Pain managed with PRN medications. Trazodone for sleep. Incision to head healing with sutures. Adding fluids for elevated BUN/Cr. Medication adjustments. Starting Ca tomorrow. Headaches are limiting factor with therapy but patient motivated and pushes through. Working on endurance and balance deficits.  Patient on target to meet rehab goals: yes, BADLs supervision. Independent bed mobility. SBA/supv for transfers and gait up to 150'. CGA for stair mobility.   *See Care Plan and progress notes for long and short-term goals.   Revisions to Treatment Plan:  Medication adjustments, monitor labs  Teaching Needs: Medications, safety, skin care, gait/transfer training, self care, etc.   Current Barriers to Discharge: Decreased caregiver support, Wound care, and Weight  Possible Resolutions to Barriers: Family education, nursing education, order recommended DME     Medical Summary Current Status: medically complicated by headache, orthostasis, vision deficits, wound management, constipation  Barriers to Discharge: Hypotension;Medical stability;Morbid Obesity;Self-care education;Uncontrolled Pain  Barriers to Discharge Comments: headache, activity tolerance with orthostasis, constipation Possible Resolutions to [employer]  Focus: medication titration for headache and constipation, medical monitorring for pre-syncopal symptoms   Continued Need for Acute Rehabilitation Level of Care: The patient requires daily medical management by a physician with specialized training in physical medicine  and rehabilitation for the following reasons: Direction of a multidisciplinary physical rehabilitation program to maximize functional independence : Yes Medical management of patient stability for increased activity during participation in an intensive rehabilitation regime.: Yes Analysis of laboratory values and/or radiology reports with any subsequent need for medication adjustment and/or medical intervention. : Yes   I attest that I was present, lead the team conference, and concur with the assessment and plan of the team.   Ernest Pine 02/20/2023, 2:19 PM

## 2023-02-20 NOTE — Progress Notes (Signed)
PROGRESS NOTE   Subjective/Complaints:  No acute complaints.  No events overnight.   Had BM overnight approximately 11 pm and this AM, no further constipation. Consult today at 3 pm, ?XRT starting tomorrow. Still cannot comforable perform superior gaze with L eye, but otherwise EOMI improving.  Does c/o new type headache over her left skull today, worsening with neck palpation.   ROS: +  headache-much improved, persistent.  No abdominal pain, constipation, diarrhea, SOB, cough, chest pain, new weakness or paraesthesias.    Objective:   No results found. Recent Labs    02/19/23 0654  WBC 4.7  HGB 8.8*  HCT 28.2*  PLT 333    Recent Labs    02/19/23 0654  NA 136  K 4.7  CL 103  CO2 21*  GLUCOSE 87  BUN 6  CREATININE 0.80  CALCIUM 8.7*     Intake/Output Summary (Last 24 hours) at 02/20/2023 1042 Last data filed at 02/20/2023 0944 Gross per 24 hour  Intake 834 ml  Output --  Net 834 ml         Physical Exam: Vital Signs Blood pressure 115/72, pulse 76, temperature 98.5 F (36.9 C), temperature source Oral, resp. rate 16, height '5\' 7"'$  (1.702 m), weight 130.7 kg, SpO2 96 %.  Physical Exam Constitutional: Appropriate appearance for age.  No acute distress.  +Obese HENT: No JVD. Neck Supple. Trachea midline. + Left craniotomy  Eyes: PERRLA. EOMI. +Discomfort with superior gaze - ongoing.  Mild nystagmus worse on right than left gaze, upward gaze causes headache and induces ptosis on the left eye. - ptosis improving Cardiovascular: RRR, no murmurs/rub/gallops.  Peripheral pulses 2+  Respiratory: CTAB. No rales, rhonchi, or wheezing. On RA.  Abdomen: + bowel sounds, normoactive. No distention or tenderness.  Skin: + L craniotomy site with absorbable sutures, site c/d/I, significant localized swelling  - area on most inferior incision line of increased swelling, without tension, compressible and without warmth  -decreased   MSK:      No apparent deformity. + TTP w/ HA reproduced on cervical paraspinal muslce and greater occipital nerve palpation on L      Strength: Moving all 4 limbs antigravity and against resistance.  Neurologic exam:  Cognition: AAO to person, place, time and event.  No new deficits appreciated from prior exam. Language: Fluent, No substitutions or neoglisms. No dysarthria.  Insight: Good insight into current condition.  Mood: Pleasant affect, appropriate mood.  CN: + L V1 sensory deficit -ongoing;  + L ptosis (? C/b periorbital edema)-improving daily, but ongoing Coordination: No apparent tremors. + Mild LUE ataxia Spasticity: MAS 0 in all extremities.      Assessment/Plan: 1. Functional deficits which require 3+ hours per day of interdisciplinary therapy in a comprehensive inpatient rehab setting. Physiatrist is providing close team supervision and 24 hour management of active medical problems listed below. Physiatrist and rehab team continue to assess barriers to discharge/monitor patient progress toward functional and medical goals  Care Tool:  Bathing    Body parts bathed by patient: Right arm, Left arm, Chest, Front perineal area, Abdomen, Buttocks, Right upper leg, Left upper leg, Right lower leg, Left lower leg, Face  Bathing assist Assist Level: Contact Guard/Touching assist     Upper Body Dressing/Undressing Upper body dressing   What is the patient wearing?: Pull over shirt    Upper body assist Assist Level: Supervision/Verbal cueing    Lower Body Dressing/Undressing Lower body dressing      What is the patient wearing?: Underwear/pull up, Pants     Lower body assist Assist for lower body dressing: Contact Guard/Touching assist     Toileting Toileting    Toileting assist Assist for toileting: Contact Guard/Touching assist     Transfers Chair/bed transfer  Transfers assist     Chair/bed transfer assist level: Contact  Guard/Touching assist     Locomotion Ambulation   Ambulation assist      Assist level: Contact Guard/Touching assist Assistive device: Walker-rolling Max distance: 200   Walk 10 feet activity   Assist     Assist level: Contact Guard/Touching assist Assistive device: Walker-rolling   Walk 50 feet activity   Assist    Assist level: Contact Guard/Touching assist Assistive device: Walker-rolling    Walk 150 feet activity   Assist    Assist level: Contact Guard/Touching assist Assistive device: Walker-rolling    Walk 10 feet on uneven surface  activity   Assist Walk 10 feet on uneven surfaces activity did not occur: Safety/medical concerns         Wheelchair     Assist Is the patient using a wheelchair?: No             Wheelchair 50 feet with 2 turns activity    Assist            Wheelchair 150 feet activity     Assist          Blood pressure 115/72, pulse 76, temperature 98.5 F (36.9 C), temperature source Oral, resp. rate 16, height '5\' 7"'$  (1.702 m), weight 130.7 kg, SpO2 96 %.  Medical Problem List and Plan: 1. Functional deficits secondary to meningioma s/p craniotomy             -patient may shower             -ELOS/Goals: 7-10 days, supervision PT/OT  - Meeting with [facility] today, ?start of XRT services tomrrow   2.  Antithrombotics: -DVT/anticoagulation:  Pharmaceutical: Heparin             -antiplatelet therapy: none   3. Pain Management:  Ongoing post-op headache             - Can continue IV toradol for post-op Headache through admission overnight; transition to oral regimen in AM             - Patient endorses nausea/allergy to Vicodin/Norco/Dilaudid - seizures with Morphine - DC and add Gabapentin 300 mg PRN Q8H for pain, HA             - Per chart review ? Allergy to tylenol  - 2/23: Reviewed medications the patient has been able to tolerate, versus recorded allergies.  Adjusted med regimen to gabapentin  200 mg 3 times daily, Tylenol 650 mg scheduled 3 times daily, with as needed Fioricet for headaches, Toradol for moderate pain, and tramadol for severe pain.   -2-24: Symptoms much improved, continue current regimen.  Gabapentin discontinued due to history of lethargy on this medication.   - 2.25-26: stable, monitor   - 2/27: Voltaren BID to neck for cervicalgia  4. Mood/Behavior/Sleep: LCSW to evaluate and provide emotional support             -  continue Cymbalta 60 mg daily (depressive disorder)             -antipsychotic agents: n/a   5. Neuropsych/cognition: This patient is capable of making decisions on his own behalf.   6. Skin/Wound Care: Routine skin care checks   7. Fluids/Electrolytes/Nutrition: Routine Is and Os and follow-up chemistries -Mild AKI on admission labs with creatinine 1.1, BUN 7, along with hyponatremia 134 from 140.  Initiate IV fluids normal saline at 150 cc/h, 1 L total.  Will repeat labs in a.m. 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 -Hemoglobin stable 2-23, 24, 26   11. Constipation. LBM PTA.              - Colace 100 mg BID + PRN Miralax inpatient; increase to BID miralax, Sennakot-S 2 tabs QHS             - PRN sorbitol, MOM, and Enema; encouraged patient to use a PRN tonight             - LBM 2/22  13.  Orthostatic hypotension, with presyncope -resolved- May be due to dehydration with AKI versus adrenal insufficiency.  Replete IV fluids as above, if no improvement this afternoon will order a.m. cortisol with a.m. labs and depending on result may initiate slower steroid taper. -Symptoms resolved with IV fluids, labs stable, a.m. ordered cortisol 17, within normal limits -Headache regimen as above, much better tolerated today  LOS: 5 days A  FACE TO FACE EVALUATION WAS PERFORMED  Gertie Gowda 02/20/2023, 10:42 AM

## 2023-02-21 ENCOUNTER — Inpatient Hospital Stay (HOSPITAL_COMMUNITY): Payer: Medicaid Other

## 2023-02-21 MED ORDER — GABAPENTIN 300 MG PO CAPS
300.0000 mg | ORAL_CAPSULE | Freq: Three times a day (TID) | ORAL | Status: DC | PRN
  Administered 2023-02-21: 300 mg via ORAL
  Filled 2023-02-21: qty 1

## 2023-02-21 MED ORDER — TOPIRAMATE 25 MG PO TABS
25.0000 mg | ORAL_TABLET | Freq: Two times a day (BID) | ORAL | Status: DC | PRN
  Administered 2023-02-21 – 2023-02-23 (×4): 25 mg via ORAL
  Filled 2023-02-21 (×4): qty 1

## 2023-02-21 NOTE — Progress Notes (Signed)
Occupational Therapy Session Note  Patient Details  Name: Evelyn [name] MRN: [id number] Date of Birth: September 03, 1971  Today's Date: 02/21/2023 OT Individual Time: 1305-1320 OT Individual Time Calculation (min): 15 min  and Today's Date: 02/21/2023 OT Missed Time: 30 Minutes Missed Time Reason: Patient fatigue;Pain   Short Term Goals: Week 1:  OT Short Term Goal 1 (Week 1): LTG=STG 2/2 ELOS  Skilled Therapeutic Interventions/Progress Updates:    Pt received in bed with 5 out of 10 pain in HA. Medicaiton already  provided for pain relief. Pt declining OOB tx but agreeable to learning about alternative pain management strategies.  Therapeutic activity Discussed yoga, meditation and breathing strategies for pain management. Pt able to engage in conversation and relate to techniques learned from natural birthing class. Pt reporting pain/grogginess from medications too great to get OOB. Provided handout below.   Exited session with pt seated in bed, exit alarm on and call light in reach   Meditation Resources: MOST IMPORTANT- you cant do it right! You are in charge of your comfort and positioning.   Love Your Brain- [email or web address] research [email or web address]   Pendulation Meditation for pain management Do a body scan feeling each body part locating a part that may be in pain  Locate a body part that feels good- can be general "my back" or very specific like "my Right big toe" Shift your attention back and forth until you feel the pain ease off approaching the body part that is in pain like a compfire- get just close enough to the pain to feel it but not to get "burned"/overwhelmed Insight Timer App Free meditations  Free white noise/brown noise playlists Has a sleep feature to listen to a meditation prior to bed time without needing to worry to turn off your phone Yoga nidra Yoga "sleep" Explores different levels of consciousness that lets your dip into a  meditative space and REST! Often longer meditations 20 min-hour   Pt left at end of session in bed with exit alarm on, call light in reach and all needs met   Therapy Documentation Precautions:  Precautions Precautions: Fall Restrictions Weight Bearing Restrictions: No General:   Therapy/Group: Individual Therapy  Tonny Branch 02/21/2023, 6:57 AM

## 2023-02-21 NOTE — Progress Notes (Signed)
PROGRESS NOTE   Subjective/Complaints:   No events overnight.  Today, woke up with intense bifrontal headache, described as aching and stabbing.  Significant increase in severity compared to prior headaches, she states it does not feel like once she has had before.  Additionally, difficulty with worsening pain and headache with EOMI today.  She is agreeable to taking a dose of gabapentin to help with the pain.   She also reports some return of feeling in the left upper part of her face, which is uncomfortable.   ROS: +  headache- worse, new quality ,increased with EOMI.  No abdominal pain, constipation, diarrhea, SOB, cough, chest pain, new weakness or paraesthesias.    Objective:   CT HEAD WO CONTRAST (5MM)  Result Date: 02/21/2023 CLINICAL DATA:  Headache, increasing frequency or severity Recent meningioma resection, this AM with severe sharp bilateral Headache and difficulty with EOMI EXAM: CT HEAD WITHOUT CONTRAST TECHNIQUE: Contiguous axial images were obtained from the base of the skull through the vertex without intravenous contrast. RADIATION DOSE REDUCTION: This exam was performed according to the departmental dose-optimization program which includes automated exposure control, adjustment of the mA and/or kV according to patient size and/or use of iterative reconstruction technique. COMPARISON:  MRI February 08, 2023. FINDINGS: Brain: Postoperative changes of meningioma resection in the inferior left frontal lobe. There is extensive edema in this region with some rightward midline shift (approximately 5 mm). Additionally there is an overlying extra-axial fluid collection which measures approximately 6 mm in thickness. No evidence of new/interval mass occupying acute hemorrhage or acute large vascular territory infarct. No hydrocephalus. Vascular: No hyperdense vessel identified. Skull: Left frontal craniotomy. Overlying scalp  edema/fluid collection. Sinuses/Orbits: Clear sinuses.  No acute orbital findings. Other: No mastoid effusions. IMPRESSION: 1. Findings of recent meningioma resection with edema in the resection cavity, locules of gas, approximately 5 mm of midline shift, and approximately 6 mm thick overlying extra-axial fluid collection. Overall, findings do not appear substantially changed relative to recent MRI; however, direct comparison is limited by CT. An MRI with contrast could better assess for any new/interval complication if clinically warranted. 2. Increasing edema/fluid overlying the left frontal craniotomy. Findings are sterility indeterminate by imaging. Electronically Signed   By: Margaretha Sheffield M.D.   On: 02/21/2023 10:39   Recent Labs    02/19/23 0654  WBC 4.7  HGB 8.8*  HCT 28.2*  PLT 333    Recent Labs    02/19/23 0654  NA 136  K 4.7  CL 103  CO2 21*  GLUCOSE 87  BUN 6  CREATININE 0.80  CALCIUM 8.7*     Intake/Output Summary (Last 24 hours) at 02/21/2023 1247 Last data filed at 02/21/2023 [id number] Gross per 24 hour  Intake 830 ml  Output --  Net 830 ml         Physical Exam: Vital Signs Blood pressure 107/73, pulse 86, temperature 98.5 F (36.9 C), temperature source Oral, resp. rate 16, height '5\' 7"'$  (1.702 m), weight 130.7 kg, SpO2 95 %.  Physical Exam Constitutional: Appropriate appearance for age.  No acute distress.  +Obese HENT: No JVD. Neck Supple. Trachea midline. + Left craniotomy  Eyes:  EOMI. +Discomfort with EOMI in all directions, with difficulty keeping eyes open.  Pupils equal round and reactive to light. Left eye. - ptosis improving Cardiovascular: RRR, no murmurs/rub/gallops.  Peripheral pulses 2+  Respiratory: CTAB. No rales, rhonchi, or wheezing. On RA.  Abdomen: + bowel sounds, normoactive. No distention or tenderness.  Skin: + L craniotomy site with absorbable sutures, site c/d/I, significant localized swelling is coming down  - area on most  inferior incision line of increased swelling, without tension, compressible and without warmth -now approximately the same as the rest of the area of swelling   MSK:      No apparent deformity. + TTP cervical paraspinals -increased      Strength: Moving all 4 limbs antigravity and against resistance -unchanged, 5 out of 5 throughout.  Neurologic exam:  Cognition: AAO to person, place, time and event.  No new deficits appreciated from prior exam. Language: Fluent, No substitutions or neoglisms. No dysarthria.  Insight: Good insight into current condition.  Mood: Pleasant affect, appropriate mood.  CN: + L V1 sensory deficit -now with some mild sensation;  + L ptosis (? C/b periorbital edema)-improving daily, but ongoing Coordination: No apparent tremors. + Mild LUE ataxia Spasticity: MAS 0 in all extremities.      Assessment/Plan: 1. Functional deficits which require 3+ hours per day of interdisciplinary therapy in a comprehensive inpatient rehab setting. Physiatrist is providing close team supervision and 24 hour management of active medical problems listed below. Physiatrist and rehab team continue to assess barriers to discharge/monitor patient progress toward functional and medical goals  Care Tool:  Bathing    Body parts bathed by patient: Right arm, Left arm, Chest, Front perineal area, Abdomen, Buttocks, Right upper leg, Left upper leg, Right lower leg, Left lower leg, Face         Bathing assist Assist Level: Contact Guard/Touching assist     Upper Body Dressing/Undressing Upper body dressing   What is the patient wearing?: Pull over shirt    Upper body assist Assist Level: Supervision/Verbal cueing    Lower Body Dressing/Undressing Lower body dressing      What is the patient wearing?: Underwear/pull up, Pants     Lower body assist Assist for lower body dressing: Contact Guard/Touching assist     Toileting Toileting    Toileting assist Assist for  toileting: Contact Guard/Touching assist     Transfers Chair/bed transfer  Transfers assist     Chair/bed transfer assist level: Contact Guard/Touching assist     Locomotion Ambulation   Ambulation assist      Assist level: Contact Guard/Touching assist Assistive device: Walker-rolling Max distance: 200   Walk 10 feet activity   Assist     Assist level: Contact Guard/Touching assist Assistive device: Walker-rolling   Walk 50 feet activity   Assist    Assist level: Contact Guard/Touching assist Assistive device: Walker-rolling    Walk 150 feet activity   Assist    Assist level: Contact Guard/Touching assist Assistive device: Walker-rolling    Walk 10 feet on uneven surface  activity   Assist Walk 10 feet on uneven surfaces activity did not occur: Safety/medical concerns         Wheelchair     Assist Is the patient using a wheelchair?: No             Wheelchair 50 feet with 2 turns activity    Assist            Wheelchair 150 feet  activity     Assist          Blood pressure 107/73, pulse 86, temperature 98.5 F (36.9 C), temperature source Oral, resp. rate 16, height '5\' 7"'$  (1.702 m), weight 130.7 kg, SpO2 95 %.  Medical Problem List and Plan: 1. Functional deficits secondary to meningioma s/p craniotomy             -patient may shower             -ELOS/Goals: 7-10 days, supervision PT/OT  - Meeting with [facility] yesterday, ?start of XRT services  - in 1-2 weeks per note, mapping next week   - 2/28: Stat CT head for severe worsening headache did not show any significant changes from prior MRI, however difficult to compare.  Patient cleared to resume therapies   2.  Antithrombotics: -DVT/anticoagulation:  Pharmaceutical: Heparin             -antiplatelet therapy: none   3. Pain Management:  Ongoing post-op headache             - Can continue IV toradol for post-op Headache through admission overnight; transition  to oral regimen in AM             - Patient endorses nausea/allergy to Vicodin/Norco/Dilaudid - seizures with Morphine - DC and add Gabapentin 300 mg PRN Q8H for pain, HA             - Per chart review ? Allergy to tylenol  - 2/23: Reviewed medications the patient has been able to tolerate, versus recorded allergies.  Adjusted med regimen to gabapentin 200 mg 3 times daily, Tylenol 650 mg scheduled 3 times daily, with as needed Fioricet for headaches, Toradol for moderate pain, and tramadol for severe pain.   -2-24: Symptoms much improved, continue current regimen.  Gabapentin discontinued due to history of lethargy on this medication.   - 2.25-26: stable, monitor   - 2/27: Voltaren BID to neck for cervicalgia   - 2/28: One-time dose of gabapentin 300 mg for severe worsening of headache, now replaced with Topamax 25 mg twice daily as needed  4. Mood/Behavior/Sleep: LCSW to evaluate and provide emotional support             -continue Cymbalta 60 mg daily (depressive disorder)             -antipsychotic agents: n/a   5. Neuropsych/cognition: This patient is capable of making decisions on his own behalf. - 2/28: Reported no housing difficulties and may need to go to shelter on discharge, anxiety around this may be worsening her headache symptoms, monitor closely and consider neuropsych consult   6. Skin/Wound Care: Routine skin care checks   7. Fluids/Electrolytes/Nutrition: Routine Is and Os and follow-up chemistries -Mild AKI on admission labs with creatinine 1.1, BUN 7, along with hyponatremia 134 from 140.  Initiate IV fluids normal saline at 150 cc/h, 1 L total.  Will repeat labs in a.m. 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 -Hemoglobin stable 2-23, 24, 26   11.  Constipation. LBM PTA.              - Colace 100 mg BID + PRN Miralax inpatient; increase to BID miralax, Sennakot-S 2 tabs QHS             -  PRN sorbitol, MOM, and Enema; encouraged patient to use a PRN tonight             - LBM 2/27  13.  Orthostatic hypotension, with presyncope -resolved- May be due to dehydration with AKI versus adrenal insufficiency.  Replete IV fluids as above, if no improvement this afternoon will order a.m. cortisol with a.m. labs and depending on result may initiate slower steroid taper. -Symptoms resolved with IV fluids, labs stable, a.m. ordered cortisol 17, within normal limits -Headache regimen as above, much better tolerated today  LOS: 6 days A FACE TO FACE EVALUATION WAS PERFORMED  Gertie Gowda 02/21/2023, 12:47 PM

## 2023-02-21 NOTE — Progress Notes (Signed)
Patient ID: Evelyn [name], female   DOB: 02-Feb-1971, 52 y.o.   MRN: [id number]   SW faxed outpatient PT/OT referral to [facility] ([phone]/f:[phone]).  Loralee Pacas, MSW, [place] Office: [phone] Cell: [phone] Fax: [phone]

## 2023-02-21 NOTE — Progress Notes (Signed)
Physical Therapy Session Note  Patient Details  Name: Evelyn [name] MRN: [id number] Date of Birth: 09-02-1971  Today's Date: 02/21/2023 PT Individual Time:  -     and Today's Date: 02/21/2023 PT Missed Time: 91 Minutes Missed Time Reason: Patient fatigue  Short Term Goals: Week 1:  PT Short Term Goal 1 (Week 1): STG = LTG due to ELOS  Skilled Therapeutic Interventions/Progress Updates:    Medical hold discontinued after CT of head negative for acute bleed. Pt resting in bed on arrival with eyes closed. Pt responding appropriately to therapists questions but reports she has had new medication and gabapentin and is feeling loopy. Pt does not feel she can walk and feels too fatigued for therapy. Pt reports her pain was worse this  morning than yesterday but that pain is improved now. Pt reports OT provided her with alternative pain management strategies and handout on meditation. Encouraged pt to review handout when more awake.    Therapy Documentation Precautions:  Precautions Precautions: Fall Restrictions Weight Bearing Restrictions: No General: PT Amount of Missed Time (min): 60 Minutes PT Missed Treatment Reason: MD hold (Comment) (On hold due to severe headache)   Therapy/Group: Individual Therapy  Jacques Navy 02/21/2023, 12:44 PM

## 2023-02-21 NOTE — Progress Notes (Signed)
Physical Therapy Session Note  Patient Details  Name: Evelyn [name] MRN: [id number] Date of Birth: [birth year]  Today's Date: 02/21/2023 PT Missed Time: 60 Minutes Missed Time Reason: MD hold (Comment) (On hold due to severe headache)  Attempted to see patient for scheduled PT treatment session, however patient is on a medical hold per physician secondary to worsening headache with significant pain. Therapist will attempt to see patient later this afternoon if scheduling permits and patient is medically stable and willing.   [place] 02/21/2023, 10:55 AM

## 2023-02-21 NOTE — Progress Notes (Signed)
Notified PA Scientist, research (medical)) of CT-results    Yehuda Mao, LPN

## 2023-02-21 NOTE — Progress Notes (Signed)
Occupational Therapy Note  Patient Details  Name: Evelyn [name] MRN: [id number] Date of Birth: [birth year]  Today's Date: 02/21/2023 OT Missed Time: 19 Minutes Missed Time Reason: MD hold (comment)  Pt greeted supine in bed reporting HA and nausea politely declining session, per PT pt on medical hold. Will continue efforts to make up for missed time as pt medically appropriate.    Corinne Ports [facility] 02/21/2023, 9:40 AM

## 2023-02-22 ENCOUNTER — Encounter: Payer: Self-pay | Admitting: Radiation Oncology

## 2023-02-22 ENCOUNTER — Telehealth: Payer: Self-pay | Admitting: Genetic Counselor

## 2023-02-22 ENCOUNTER — Inpatient Hospital Stay (HOSPITAL_COMMUNITY): Payer: Medicaid Other

## 2023-02-22 ENCOUNTER — Telehealth: Payer: Self-pay | Admitting: Radiation Therapy

## 2023-02-22 DIAGNOSIS — D420 Neoplasm of uncertain behavior of cerebral meninges: Secondary | ICD-10-CM | POA: Insufficient documentation

## 2023-02-22 NOTE — Progress Notes (Signed)
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    MRI of brain IMPRESSION: 1. Status post interval left frontal craniotomy and resection of a previously noted left anterior cranial fossa mass. Decreased mass effect on the left frontal lobe, with improved size of the left lateral ventricle and 5 mm of residual left to right midline shift, previously 9 mm when remeasured similarly. 2. 6 mm extra-axial fluid collection along the left frontal convexity, subjacent to the craniotomy flap, none expected postoperative 02/09/2023 02:37  Patient presented with symptoms of:   Evelyn [name] is a 52 y.o. female who presented to the ED on 12/20/22 with c/o of recurrent and increasing episodes of near syncope/blackout spells over the last year. Per encounter notes, the patient characterized that she would be completely fine, and then suddenly begin to stare off into space and become non-responsive.    Past or anticipated interventions, if any, per neurosurgery:   DAY OF SURGERY: 02/08/23   PRE-OPERATIVE DIAGNOSIS:  Left frontal skull base tumor   POST-OPERATIVE DIAGNOSIS:  Left medial clinoid meningioma   PROCEDURE:  Left craniotomy for tumor resection, use of frameless stereotaxy and operating microscope   SURGEON:  Surgeon(s) and Role:    Judith Part, MD - Primary    Norm Parcel PA - Assisting  Assessment & Plan: 52 y.o. woman w/ seizures s/p L pterional for large meningioma, recovering well.   -cont home keppra -final path pending -MRI with some residual along the cribriform, can follow on serial imaging -activity as tolerated, PT/OT rec'd HHPTOT -SQH -possible discharge home tomorrow  Judith Part  02/10/23  Judith Part, MD  Physician Neurosurgery   Progress Notes    Signed   Date of Service: 02/22/2023 11:49 AM   Signed      Re-eval in rehab today due to some persistent headaches and possible L foot weakness. 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 Denies foot weakness but when the headache gets severe she says she has a single twitch of the LUE or LLE, no Jacksonian march or tonic / clonic component. On my exam, Aox3, PERRL, EOMI, FS & SS, TM, strength 5/5x4 w/o drift, speech fluent with normal content and intact repetition. [place] reviewed, shows expected post-op findings.   -no change in plan of care from my perspective, I think a repeat MRI is reasonable if there are concerns from someone that has been seeing her every day while in rehab compared to my single evaluation. Discussed the pseudomeningocele, it should subside with time but might be there a few weeks, may wax and wane with time of day / position / activity / etc and by itself is not a concerning development.         Electronically signed by Judith Part, MD at 02/22/2023 11:53 AM   Past or anticipated interventions, if any, per medical oncology: none at this time  Dose of Decadron, if applicable: Reports she was taking steroids prior to surgery, but was not directed to resume after surgery  Recent neurologic symptoms, if any:  Seizures: Not since surgery. She continues to take Taking Keppra twice  a day Headaches: Reports constant headaches prior to surgery. Now states they're intermittent. States they alternate between aching and sharpe/stabbing; Currently taking topamax, fioricet, and tylenol help take edge off Nausea: Occurs when headache pain is intense  Dizziness/ataxia: Reports feeling unsteady on her feet (particularly related to feeling off balance on  her left side) Difficulty with hand coordination: Denies--does have decreased dexterity to left hand Focal numbness/weakness: Left leg feels weaker/unsteady Visual deficits/changes: Yes--unable to wear glasses due to swelling so she reports her vision is blurry Confusion/Memory deficits: Reports she will be talking to someone and will forget part way through what she was talking about  SAFETY ISSUES: Prior radiation? No Pacemaker/ICD? No Possible current pregnancy? No--hysterectomy Is the patient on methotrexate? No  Additional Complaints / other details: Currently living with her mother

## 2023-02-22 NOTE — Progress Notes (Signed)
Patient ID: Evelyn [name], female   DOB: 08/03/1971, 52 y.o.   MRN: [id number]  9am-SW returned phone call to pt uncle Parks Neptune who reported concerns about pt discharge on Saturday. Reports she is unable to come to his home as his wife has COVID and has continued to test positive. Reports there are no other housing options. Discussed SW limitations and inability to find pt housing. Family is working on getting her SSDI and emergency housing if possible. SW plans to meet with wife pt and family at 1pm to discuss further.    SW shared concerns with medical team.   SW met with pt, pt mother, pt uncle Parks Neptune, and pt son Karle Starch and DIL via conference call to discuss discharge plan. Family understands we are waiting to confirm if pt will be extended.   Medical team willing to extend discharge to Monday due to missed therapies. Attending will follow-up with pt son.   1452-SW spoke with pt son Karle Starch to inform on above. He is aware this SW will follow-up with final recommendations tomorrow.  SW notified pt and pt mother d/c date is now Monday.   Loralee Pacas, MSW, [place] Office: [phone] Cell: [phone] Fax: [phone]

## 2023-02-22 NOTE — Telephone Encounter (Signed)
I spoke with the patient and her mother about the upcoming appointment with Dr. Isidore Moos on Tuesday 3/5.   Mont Dutton R.T.(R)(T) Radiation Special Procedures Navigator

## 2023-02-22 NOTE — Progress Notes (Signed)
Re-eval in rehab today due to some persistent headaches and possible L foot weakness. 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 Denies foot weakness but when the headache gets severe she says she has a single twitch of the LUE or LLE, no Jacksonian march or tonic / clonic component. On my exam, Aox3, PERRL, EOMI, FS & SS, TM, strength 5/5x4 w/o drift, speech fluent with normal content and intact repetition. [place] reviewed, shows expected post-op findings.  -no change in plan of care from my perspective, I think a repeat MRI is reasonable if there are concerns from someone that has been seeing her every day while in rehab compared to my single evaluation. Discussed the pseudomeningocele, it should subside with time but might be there a few weeks, may wax and wane with time of day / position / activity / etc and by itself is not a concerning development.

## 2023-02-22 NOTE — Progress Notes (Signed)
Physical Therapy Session Note  Patient Details  Name: Evelyn [name] MRN: [id number] Date of Birth: 29-Mar-1971  Today's Date: 02/22/2023 PT Missed Time: 34 Minutes Missed Time Reason: Pain   Patient received supported upright in bed eating lunch with family present. Patiently politely declining participation at this time due to severe headache, reporting noises from MRI intensified the pain. Patient reports pain medication received approximately 10 min prior. Patient left needs in reach and bed alarm on.   America Brown 02/22/2023, 12:26 PM

## 2023-02-22 NOTE — Telephone Encounter (Signed)
Scheduled appt per 02/28 referral. Pt is aware of appt date and time. Pt is aware to arrive 15 mins prior to appt time and to bring and updated insurance card. Pt is aware of appt location.

## 2023-02-22 NOTE — Progress Notes (Signed)
Dr. Tressa Busman Notified of MRI Results.   Yehuda Mao, LPN

## 2023-02-22 NOTE — Progress Notes (Signed)
Physical Therapy Session Note  Patient Details  Name: Evelyn [name] MRN: [id number] Date of Birth: 1971-05-12  Today's Date: 02/22/2023 PT Individual Time: 0800-0833 PT Individual Time Calculation (min): 33 min  and Today's Date: 02/22/2023 PT Missed Time: 42 Minutes Missed Time Reason: Pain;Nursing care;Other (Comment) (changes in Lt LE coordination and severe headached with Lt UE spasm.)  Short Term Goals: Week 1:  PT Short Term Goal 1 (Week 1): STG = LTG due to ELOS  Skilled Therapeutic Interventions/Progress Updates:    Pt received in bed resting with eyes closed, mother at bedside. Reports headache pain remains 8/10 with intermittent sharp pains. Pt expresses concerns she will not keep progressing and motivated to try with therapy despite pain. Patient has not received pain medications and RN notified and requested pain meds to be given to pt. Pt was able to sit up to long sit in bed with supervision, pt taking extra time and effort to raise trunk and move to side of bed placing feet on floor. Pt completed 2x 5 reps meditative breathing for slow count in through nose and out through mouth. Patient attempted to march bil LE at EOB, Lt LE noted to flex at hip and abduct with pt unable to raise and and lower leg back to starting position. Rt LE coordination appears intact. Pt returned to supine due to c/o dizziness, reported improved in supine. Pt noted to have Lt UE spasm into flexor tone that coincided with c/o sharp pains across frontal and Lt head. Rn at bedside for assessment and holding further therapy at this time. Will continue therapies once MD okays to continue.   BP supine 106/74   HR 75 BP sitting (long sit) 110/76   HR 90 BP sitting (EOB) 117/74  HR 81      Therapy Documentation Precautions:  Precautions Precautions: Fall Restrictions Weight Bearing Restrictions: No Pain: Pain Assessment Pain Scale: 0-10 Pain Score: 8  Pain Type: Surgical pain Pain Location:  Head Pain Orientation: Left;Anterior Pain Descriptors / Indicators: Headache Pain Frequency: Constant Pain Intervention(s): Medication (See eMAR)   Therapy/Group: Individual Therapy  Verner Mould, DPT Acute Rehabilitation Services Office [phone]  02/22/23 7:06 AM

## 2023-02-22 NOTE — Progress Notes (Signed)
PROGRESS NOTE   Subjective/Complaints:   No events overnight. This AM was working with therapies and noted severe headache onset with new neuro deficits including left LE weakness and L upper extremity spasms and flexor tone associated with sharp, stabbing headaches.   On evaluation ,patient resting in bed, mom states she had an awful night, PRN headache medications were not helpful. Thinks her swelling may be increased, and she is feeling severe pain and tension across L V1 distribution where she was prior numb,   ROS: +  headache- worse.  No abdominal pain, constipation, diarrhea, SOB, cough, chest pain, new weakness or paraesthesias.    Objective:   CT HEAD WO CONTRAST (5MM)  Result Date: 02/21/2023 CLINICAL DATA:  Headache, increasing frequency or severity Recent meningioma resection, this AM with severe sharp bilateral Headache and difficulty with EOMI EXAM: CT HEAD WITHOUT CONTRAST TECHNIQUE: Contiguous axial images were obtained from the base of the skull through the vertex without intravenous contrast. RADIATION DOSE REDUCTION: This exam was performed according to the departmental dose-optimization program which includes automated exposure control, adjustment of the mA and/or kV according to patient size and/or use of iterative reconstruction technique. COMPARISON:  MRI February 08, 2023. FINDINGS: Brain: Postoperative changes of meningioma resection in the inferior left frontal lobe. There is extensive edema in this region with some rightward midline shift (approximately 5 mm). Additionally there is an overlying extra-axial fluid collection which measures approximately 6 mm in thickness. No evidence of new/interval mass occupying acute hemorrhage or acute large vascular territory infarct. No hydrocephalus. Vascular: No hyperdense vessel identified. Skull: Left frontal craniotomy. Overlying scalp edema/fluid collection. Sinuses/Orbits:  Clear sinuses.  No acute orbital findings. Other: No mastoid effusions. IMPRESSION: 1. Findings of recent meningioma resection with edema in the resection cavity, locules of gas, approximately 5 mm of midline shift, and approximately 6 mm thick overlying extra-axial fluid collection. Overall, findings do not appear substantially changed relative to recent MRI; however, direct comparison is limited by CT. An MRI with contrast could better assess for any new/interval complication if clinically warranted. 2. Increasing edema/fluid overlying the left frontal craniotomy. Findings are sterility indeterminate by imaging. Electronically Signed   By: Margaretha Sheffield M.D.   On: 02/21/2023 10:39   No results for input(s): "WBC", "HGB", "HCT", "PLT" in the last 72 hours.  No results for input(s): "NA", "K", "CL", "CO2", "GLUCOSE", "BUN", "CREATININE", "CALCIUM" in the last 72 hours.   Intake/Output Summary (Last 24 hours) at 02/22/2023 1125 Last data filed at 02/21/2023 1700 Gross per 24 hour  Intake 476 ml  Output --  Net 476 ml         Physical Exam: Vital Signs Blood pressure 115/79, pulse 89, temperature 98.1 F (36.7 C), resp. rate 16, height '5\' 7"'$  (1.702 m), weight 130.7 kg, SpO2 97 %.  Physical Exam Constitutional: Appropriate appearance for age.  No acute distress.  +Obese HENT: No JVD. Neck Supple. Trachea midline. + Left craniotomy  Eyes:  EOMI. +Discomfort with EOMI in superior > lateral directions, with difficulty keeping eyes open.  Pupils equal round and reactive to light. Left eye. - ptosis unchanged, able to overcome with effort Cardiovascular: RRR, no  murmurs/rub/gallops.  Peripheral pulses 2+  Respiratory: CTAB. No rales, rhonchi, or wheezing. On RA.  Abdomen: + bowel sounds, normoactive. No distention or tenderness.  Skin: + L craniotomy site with absorbable sutures, site c/d/I, significant localized swelling at inferior incision line appears consistent with prior exams.    MSK:      No apparent deformity. + Hyperalgesia along sling of L forehead to posterior cervical, with associated shivering and headache with light palpation       Strength: Moving all 4 limbs antigravity and against resistance. BL LE exam limited by pain/headache with effort  Neurologic exam:  Cognition: AAO to person, place, time and event.  No new deficits appreciated from prior exam. Language: Fluent, No substitutions or neoglisms. No dysarthria.  Insight: Good insight into current condition.  Mood: Pleasant affect, appropriate mood.  CN: + L V1 sensory deficit -now with hyperalgesia;  + L ptosis (? C/b periorbital edema)-improving daily, but ongoing Coordination: No apparent tremors.L>R UE tremor/shivering at rest with mild ataxia.  Spasticity: No notable spasticity or tone in BL UE or Les on exam.      Assessment/Plan: 1. Functional deficits which require 3+ hours per day of interdisciplinary therapy in a comprehensive inpatient rehab setting. Physiatrist is providing close team supervision and 24 hour management of active medical problems listed below. Physiatrist and rehab team continue to assess barriers to discharge/monitor patient progress toward functional and medical goals  Care Tool:  Bathing    Body parts bathed by patient: Right arm, Left arm, Chest, Front perineal area, Abdomen, Buttocks, Right upper leg, Left upper leg, Right lower leg, Left lower leg, Face         Bathing assist Assist Level: Contact Guard/Touching assist     Upper Body Dressing/Undressing Upper body dressing   What is the patient wearing?: Pull over shirt    Upper body assist Assist Level: Supervision/Verbal cueing    Lower Body Dressing/Undressing Lower body dressing      What is the patient wearing?: Underwear/pull up, Pants     Lower body assist Assist for lower body dressing: Contact Guard/Touching assist     Toileting Toileting    Toileting assist Assist for toileting:  Contact Guard/Touching assist     Transfers Chair/bed transfer  Transfers assist     Chair/bed transfer assist level: Contact Guard/Touching assist     Locomotion Ambulation   Ambulation assist      Assist level: Contact Guard/Touching assist Assistive device: Walker-rolling Max distance: 200   Walk 10 feet activity   Assist     Assist level: Contact Guard/Touching assist Assistive device: Walker-rolling   Walk 50 feet activity   Assist    Assist level: Contact Guard/Touching assist Assistive device: Walker-rolling    Walk 150 feet activity   Assist    Assist level: Contact Guard/Touching assist Assistive device: Walker-rolling    Walk 10 feet on uneven surface  activity   Assist Walk 10 feet on uneven surfaces activity did not occur: Safety/medical concerns         Wheelchair     Assist Is the patient using a wheelchair?: No             Wheelchair 50 feet with 2 turns activity    Assist            Wheelchair 150 feet activity     Assist          Blood pressure 115/79, pulse 89, temperature 98.1 F (36.7 C), resp.  rate 16, height '5\' 7"'$  (1.702 m), weight 130.7 kg, SpO2 97 %.  Medical Problem List and Plan: 1. Functional deficits secondary to meningioma s/p craniotomy             -patient may shower             -ELOS/Goals: 7-10 days, supervision PT/OT  - Meeting with [facility] yesterday, ?start of XRT services  - in 1-2 weeks per note, mapping next week   - 2/28: Stat CT head for severe worsening headache did not show any significant changes from prior MRI, however difficult to compare.  Patient cleared to resume therapies   - 2/29: New LUE and LLE deficits on PT exam this AM; therapy holds, stat MRI head, Dr. Zada Finders notified and pending eval.    2.  Antithrombotics: -DVT/anticoagulation:  Pharmaceutical: Heparin             -antiplatelet therapy: none   3. Pain Management:  Ongoing post-op headache              - Can continue IV toradol for post-op Headache through admission overnight; transition to oral regimen in AM             - Patient endorses nausea/allergy to Vicodin/Norco/Dilaudid - seizures with Morphine - DC and add Gabapentin 300 mg PRN Q8H for pain, HA             - Per chart review ? Allergy to tylenol  - 2/23: Reviewed medications the patient has been able to tolerate, versus recorded allergies.  Adjusted med regimen to gabapentin 200 mg 3 times daily, Tylenol 650 mg scheduled 3 times daily, with as needed Fioricet for headaches, Toradol for moderate pain, and tramadol for severe pain.   -2-24: Symptoms much improved, continue current regimen.  Gabapentin discontinued due to history of lethargy on this medication.   - 2.25-26: stable, monitor   - 2/27: Voltaren BID to neck for cervicalgia   - 2/28: One-time dose of gabapentin 300 mg for severe worsening of headache, now replaced with Topamax 25 mg twice daily as needed   - 2/29: Pending neurosurgery eval as above. Did discuss how return of sensation in L V1 likely d/t decreased swelling, hyperalgesia likely contributing to HA pain.   4. Mood/Behavior/Sleep: LCSW to evaluate and provide emotional support             -continue Cymbalta 60 mg daily (depressive disorder)             -antipsychotic agents: n/a   5. Neuropsych/cognition: This patient is capable of making decisions on his own behalf. - 2/28: Reported  housing difficulties and may need to go to shelter on discharge; SW following for options, anxiety may contribute to symptoms   6. Skin/Wound Care: Routine skin care checks   7. Fluids/Electrolytes/Nutrition: Routine Is and Os and follow-up chemistries -Mild AKI on admission labs with creatinine 1.1, BUN 7, along with hyponatremia 134 from 140.  Initiate IV fluids normal saline at 150 cc/h, 1 L total.  Will repeat labs in a.m. -Repeat labs stable 2-24; stable 2-26   8: Seizure onset/meningioma: continue Keppra   9: Class 3  obesity s/p RYGB 04/16/2017   10: Normocytic anemia: follow-up CBC -Hemoglobin decreased to 8.7 this a.m., prior stable at 9-10, repeat H&H this afternoon. -Hemoglobin stable 2-23, 24, 26   11. Constipation. LBM PTA.              - Colace 100 mg  BID + PRN Miralax inpatient; increase to BID miralax, Sennakot-S 2 tabs QHS             - PRN sorbitol, MOM, and Enema; encouraged patient to use a PRN tonight             - LBM 2/27  13.  Orthostatic hypotension, with presyncope -resolved- May be due to dehydration with AKI versus adrenal insufficiency.  Replete IV fluids as above, if no improvement this afternoon will order a.m. cortisol with a.m. labs and depending on result may initiate slower steroid taper. -Symptoms resolved with IV fluids, labs stable, a.m. ordered cortisol 17, within normal limits -Headache regimen as above, much better tolerated today  LOS: 7 days A FACE TO FACE EVALUATION WAS PERFORMED  Gertie Gowda 02/22/2023, 11:25 AM

## 2023-02-22 NOTE — Progress Notes (Signed)
Occupational Therapy Note  Patient Details  Name: Tu Ference MRN: [id number] Date of Birth: 20-May-1971  Today's Date: 02/22/2023 OT Missed Time: 22 Minutes Missed Time Reason: MD hold (comment) (neuro changes, MD hold)  Patient missed 90 minutes of OT treatment session due to MD hold from neurological changes.  Just informed that MRI was clear and pt cleared to resume therapy. Will follow up tomorrow as OT unable to adjust schedule to make up time this afternoon.    Daneen Schick Shardea Cwynar 02/22/2023, 1:54 PM

## 2023-02-23 ENCOUNTER — Ambulatory Visit: Payer: Medicaid Other | Admitting: Radiation Oncology

## 2023-02-23 DIAGNOSIS — F4322 Adjustment disorder with anxiety: Secondary | ICD-10-CM

## 2023-02-23 DIAGNOSIS — F4323 Adjustment disorder with mixed anxiety and depressed mood: Secondary | ICD-10-CM

## 2023-02-23 MED ORDER — DULOXETINE HCL 30 MG PO CPEP
30.0000 mg | ORAL_CAPSULE | Freq: Every day | ORAL | Status: DC
  Administered 2023-02-23 – 2023-02-25 (×3): 30 mg via ORAL
  Filled 2023-02-23 (×3): qty 1

## 2023-02-23 MED ORDER — TOPIRAMATE 25 MG PO TABS
50.0000 mg | ORAL_TABLET | Freq: Two times a day (BID) | ORAL | Status: DC
  Administered 2023-02-23 – 2023-02-26 (×6): 50 mg via ORAL
  Filled 2023-02-23 (×6): qty 2

## 2023-02-23 NOTE — Progress Notes (Signed)
Physical Therapy Session Note  Patient Details  Name: Evelyn [name] MRN: [id number] Date of Birth: [birth year]  Today's Date: 02/23/2023 PT Individual Time: 1100-1200 PT Individual Time Calculation (min): 60 min   Short Term Goals: Week 1:  PT Short Term Goal 1 (Week 1): STG = LTG due to ELOS  Skilled Therapeutic Interventions/Progress Updates: 1st Treatment Session-  Patient greeted supine in bed with mother present and agreeable to trying with therapy this morning. Patient transitioned from supine to sitting EOB independently- While sitting EOB, patient donned B shoes. Patient stood from EOB without the use of an AD and SBA. Patient ambulated from her room to rehab gym without AD and Supv- Patient demonstrated decreased cadence secondary to reports of headache, 5/10 and premedicated.   Patient gait trained x190' without AD and supv- Patient demonstrated improved cadence, however continues to "focus" on a spot in front of her in order to ensure she has her balance throughout gait trial. Patient educated on not focusing too much in order for gait to be more fluid.   Patient stood on airex foam pad without UE support and eyes closed, 3 x 1 minute with CGA for safety- Patient noted to have increased fear/anxiety with challenging activities and required VC for pursed lip breathing.   Patient tasked with stepping onto/off of the airex foam pad without UE support and CGA for safety- Patient required encouragement for trying challenging activities in order to improve overall dynamic stability. Performed x10 total.   Patient tasked with stepping laterally onto/off of airex foam pad, x10 total alternating which LE leads. Therapist providing CGA for safety with no LOB noted, however increased time required to complete secondary to increased fear/anxiety with activity.   Patient ambulated back to her room without AD and supv for safety- Patient left sitting EOB with call bell within reach, bed  alarm on, mother present and all needs met. RN present to administer pain medication. Patient required increased time to complete all activities in treatment session with frequent seated rest breaks secondary to pain and fatigue, however willing and able to participate.    Therapy Documentation Precautions:  Precautions Precautions: Fall Restrictions Weight Bearing Restrictions: No   Therapy/Group: Individual Therapy  Sydnee Lamour 02/23/2023, 7:43 AM

## 2023-02-23 NOTE — Progress Notes (Signed)
Inpatient Rehabilitation Discharge Medication Review by a Pharmacist  A complete drug regimen review was completed for this patient to identify any potential clinically significant medication issues.  High Risk Drug Classes Is patient taking? Indication by Medication  Antipsychotic No   Anticoagulant No   Antibiotic No   Opioid Yes Tramadol- acute pain  Antiplatelet No   Hypoglycemics/insulin No   Vasoactive Medication No   Chemotherapy No   Other Yes Cymbalta- MDD Pepcid- GERD Keppra- seizure ppx Topamax- HA ppx     Type of Medication Issue Identified Description of Issue Recommendation(s)  Drug Interaction(s) (clinically significant)     Duplicate Therapy     Allergy     No Medication Administration End Date     Incorrect Dose     Additional Drug Therapy Needed     Significant med changes from prior encounter (inform family/care partners about these prior to discharge).    Other  PTA meds: Decadron Ergocalciferol Restart PTA meds if necessary at time of discharge, if warranted     Clinically significant medication issues were identified that warrant physician communication and completion of prescribed/recommended actions by midnight of the next day:  No   Time spent performing this drug regimen review (minutes):  30   Shevawn Langenberg BS, PharmD, BCPS Clinical Pharmacist 02/23/2023 8:25 AM  Contact: [phone] after 3 PM  "Be curious, not judgmental..." -Jamal Maes

## 2023-02-23 NOTE — Progress Notes (Signed)
Occupational Therapy Session Note  Patient Details  Name: Evelyn [name] MRN: [id number] Date of Birth: June 27, 1971  Today's Date: 02/23/2023 Session 1 OT Individual Time: [id number] OT Individual Time Calculation (min): 75 min   Session 2 OT Individual Time: 1300-1400 OT Individual Time Calculation (min): 60 min    Short Term Goals: Week 1:  OT Short Term Goal 1 (Week 1): LTG=STG 2/2 ELOS  Skilled Therapeutic Interventions/Progress Updates:    Session 1 Pt greeted semi-reclined in bed. She still reported headache and stated she felt "drugged" from sleep meds, although they did help her sleep. Patient initially felt she was too weak to shower. OT utilized therapeutic use of self and pt preferred gospel music to encourage pt to get up and get ready for the day. After sitting EOB for 10 minutes, she requested to shower. Pt ambulated to bathroom with CGA/supervision although slower than usual. Bathing completed from tub beach with increased time and supervision. Ambulatory to the EOB with CGA. Gait and weakness seemed the same as it was on Tuesday the last time this OT saw patient. No acute changes from this OT's perspective, other than more fatigue from meds. Dressing tasks completed from EOB with increased time, but supervision. Pt left semi-reclined in bed with needs met.   Session 2 Treatment session focused on vision. Pt greeted in the bathroom, handoff to OT from nursing. Pt with successful BM and voided bladder. Mod I for toileting tasks. She then washed hands at the sink and agreeable to go to the gym. Pt ambulated with supervision to dayroom. OT utilized brock string for convergence exercises, figure 8 with thumb, and pencil pushups. OT placed letter A on a popsicle stick and then the letter "B" taped up in her room to continue with eye exercises in room. OT discussed d/c plan and DME needs with patient and mom. Plan is to go to her brothers house where she will have shower seat and  be abel to live on one level. Educated patients mother on providing supervision assistance. OT made patient her own Broc string to continue working on convergence. Pt left semi-reclined in bed with bed alarm on, call bell in reach, and needs met.   Therapy Documentation Precautions:  Precautions Precautions: Fall Restrictions Weight Bearing Restrictions: No Pain: Pain Assessment Pain Score: 7  Rest and repositioned:     Therapy/Group: Individual Therapy  Valma Cava 02/23/2023, 2:47 PM

## 2023-02-23 NOTE — Consult Note (Signed)
Neuropsychological Consultation Comprehensive Inpatient Rehab   Patient:   Evelyn [name]   DOB:   May 04, 1971  MR Number:  [id number]  Location:  [facility] [id number] [place] [address] Dept: [place]: [phone]           Date of Service:   02/23/2023  Start Time:   9 AM End Time:   10 AM  Provider/Observer:  Ilean Skill, Psy.D.       Clinical Neuropsychologist       Billing Code/Service: [phone]  Reason for Service:    Evelyn [name] is a 52 year old female referred for neuropsychological consultation due to adjustment and coping issues during her current admission onto the comprehensive inpatient rehabilitation unit.  Patient was admitted to the rehab unit secondary to dysfunction due to large meningioma surgical resection.  Patient presented after new onset seizures with left-sided visual loss.  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 Zada Finders on 02/08/2023.  The patient has been having difficulties for years including headaches that were attributed to be migrainous in nature but potentially related to the presence of a meningioma for some time.  Patient also describes extended period of time where she was having acute absents type experiences with confusion and disorientation.  They initially started very briefly but began to last longer and longer.  Patient reports that she attributed them to just getting older although she describes several years of these occurring.  These even occurred while she was at work as well as driving her car.  The patient would have other people call her name over and over and take quite a long time to orient.  These events were very likely related to absence seizures and her headaches are very likely related to the development and progression of a meningioma  that was present for quite some time before identification.  The patient describes ongoing issues with vision and olfaction the patient was told that the meningioma had wrapped around her optic nerve as well as invaded her olfactory nerves.  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  Patient was oriented during the clinical interview today and denied any further absence seizure-like symptoms.  Patient reports that she continues to have headache and pain and at 1 point sneezed and crying out in pain immediately during the sneeze.  The patient acknowledges ongoing anxiety and worry about what to expect next.  The patient has been told that she will have follow-up radiation treatments for her meningioma but has not progressed that far to specifics with oncology.  Patient reports that not knowing what will happen next is a significant stressor and causes her anxiety and worry.  However, she reports that she is maintaining a good attitude and is staying positive about future prognosis.  HPI for the current admission:    HPI: Evelyn [name] is a 52 year old female with new onset seizures with left- sided visual loss. Started on Keppra. 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 She underwent left craniotomy and tumor resection by Dr Zada Finders on 02/08/2023. Moderate headache. Vision improved. Keppra continued. 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 Expected left periorbital edema. Patient  requiring min guard assist with cues for transfers and safety and min assist for ambulation. Final path is grade 2 meningioma.  Headaches improve with Toradol. The patient requires inpatient physical medicine and rehabilitation evaluations and treatment secondary to dysfunction due to large meningioma.   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   Meningioma, cerebral ([place]) 02/15/2023   Brain tumor ([place]) 02/08/2023   Boil 03/15/2022   Patellofemoral arthritis of left knee 02/08/2018   Thiamine deficiency 10/23/2017   Vitamin D deficiency 10/23/2017   Anxiety 08/09/2017   H/O gastric bypass 05/22/2017   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 her dress was Appropriate and she was Well Groomed and her manners were Appropriate to the situation.  her participation was indicative of Appropriate and Redirectable behaviors.  There were physical disabilities noted.  she displayed an appropriate level of cooperation and motivation.    Interactions:    Active Appropriate and Redirectable  Attention:   abnormal and attention span appeared shorter than expected for age  Memory:   within normal limits; recent and remote memory intact  Visuo-spatial:   not examined  Speech (Volume):  low  Speech:   normal; normal  Thought Process:  Coherent and Relevant  Coherent and Directed  Though Content:  WNL; not suicidal and not homicidal  Orientation:   person, place, time/date, and  situation  Judgment:   Fair  Planning:   Fair  Affect:    Anxious  Mood:    Anxious  Insight:   Fair  Intelligence:   normal  Family Med/Psych History:  Family History  Problem Relation Age of Onset   Breast cancer Mother 3   Breast cancer Paternal Aunt    Breast cancer Paternal Grandmother     Risk of Suicide/Violence: virtually non-existent   Impression/DX:   Evelyn [name] is a 52 year old female referred for neuropsychological consultation due to adjustment and coping issues during her current admission onto the comprehensive inpatient rehabilitation unit.  Patient was admitted to the rehab unit secondary to dysfunction due to large meningioma surgical resection.  Patient presented after new onset seizures with left-sided visual loss.  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 Zada Finders on 02/08/2023.  The patient has been having difficulties for years including headaches that were attributed to be migrainous in nature but potentially related to the presence of a meningioma for some time.  Patient also describes extended period of time where she was having acute absents type experiences with confusion and disorientation.  They initially started very briefly but began to last longer and longer.  Patient reports that she attributed them to just getting older although she describes several years of  these occurring.  These even occurred while she was at work as well as driving her car.  The patient would have other people call her name over and over and take quite a long time to orient.  These events were very likely related to absence seizures and her headaches are very likely related to the development and progression of a meningioma that was present for quite some time before identification.  The patient describes ongoing issues with vision and olfaction the patient was told that the meningioma had  wrapped around her optic nerve as well as invaded her olfactory nerves.  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  Patient was oriented during the clinical interview today and denied any further absence seizure-like symptoms.  Patient reports that she continues to have headache and pain and at 1 point sneezed and crying out in pain immediately during the sneeze.  The patient acknowledges ongoing anxiety and worry about what to expect next.  The patient has been told that she will have follow-up radiation treatments for her meningioma but has not progressed that far to specifics with oncology.  Patient reports that not knowing what will happen next is a significant stressor and causes her anxiety and worry.  However, she reports that she is maintaining a good attitude and is staying positive about future prognosis.  Disposition/Plan:  Today we worked on coping and adjustment issues with residual impacts postsurgically from a very large left frontoparietal angioma that has also impacted her olfactory nerves and optic nerve.  Patient denied recent seizures post surgery.  Diagnosis:    Adjustment reaction with depression and anxiety symptoms         Electronically Signed   _______________________ Ilean Skill, Psy.D. Clinical Neuropsychologist

## 2023-02-23 NOTE — Progress Notes (Signed)
Physical Therapy Session Note  Patient Details  Name: Sabirin Kravchenko MRN: [id number] Date of Birth: 07/11/1971  Today's Date: 02/23/2023 PT Individual Time: 1450-1536 PT Individual Time Calculation (min): 46 min   Short Term Goals: Week 1:  PT Short Term Goal 1 (Week 1): STG = LTG due to ELOS  Skilled Therapeutic Interventions/Progress Updates:  Pt greeted supine in bed sleeping with Mom present in room. She reports just having received muscle relaxer and she was feeling "good." She initially declined therapy but with encouragement, agreed to therapy participation. Sidelying to sit transfer edge of bed Mod independent. Sit to stand from edge of bed modified independent. Gait training without assistive device > 200 ft with PT CGA with pt initially scissoring with several feet of gait requiring verbal cues for correction which she was able to correct with increased attention. Floor with slight inclines and declines with patient with good foot clearance to allow her not to stumble. Sit to stands x 10 from chair with concentration on quality and control with patient able to perform x 2 with hands and x 8 without hands. Gait training approx 60 ft with PT light CGA with concentration on stride and purposeful gait. Side stepping x 32 ft each direction x 2 with concentration on activation of Glute Medius to assist with balance; Retro gait x 32 ft x 2 bouts with concentrate on Glute tightness to assist with balance and control with PT palpation to ensure proper muscle facilitation. Stop/Go ambulation, to incorporate turns as well, performed with PT CGA to work on balance and joint co-contraction for functional strengthening. Gait training back to room with pt need to stand and rest x several bouts but was determined to continue. Gait concentration on focus and purposeful stepping. Pt reports no pain with treatment, only fatigue. Stand to sit transfer to bed Mod independent. Sit to supine transfer modified  independent. Pt left in room supine, call bell within reach, and Mom present.      Therapy Documentation Precautions:  Precautions Precautions: Fall Restrictions Weight Bearing Restrictions: No   Therapy/Group: Individual Therapy  America Brown 02/23/2023, 4:02 PM

## 2023-02-23 NOTE — Progress Notes (Signed)
PROGRESS NOTE   Subjective/Complaints:   No events overnight. Patient spent an hour with therapies this AM showering, had some nausea and ongoing headache but overall feels better than yesterday. States Topomax helped significantly and she slept well overnight. She continues to be limited by her headache though and thinks she can only participate with therapies for maybe 30 minutes the rest of today.   Patient is somewhat tearful, endorsing depression and frustration regarding her inability to do things she did prior to the surgery. She sees herself as a supporter of others and is having a hard time accepting the need to slow down and accept help. She states the news that she will need to undergo radiation therapy has also been difficult to grapple with. We talked for a long time about her symptoms and she is agreeable to a small increase in her Duloxetine; she has been stable on this medication for some time but is unsure if it is working.   ROS: +  headache- mildly improved with Topomax; +nausea - with activity, HA.  No abdominal pain, constipation, diarrhea, SOB, cough, chest pain, new weakness or paraesthesias.    Objective:   MR BRAIN WO CONTRAST  Result Date: 02/22/2023 CLINICAL DATA:  Headache, neuro deficit. EXAM: MRI HEAD WITHOUT CONTRAST TECHNIQUE: Multiplanar, multiecho pulse sequences of the brain and surrounding structures were obtained without intravenous contrast. COMPARISON:  Head CT February 21, 2023; MRI of the brain February 08, 2019. 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 Area of encephalomalacia and gliosis in the inferior left frontal lobe with peripheral restricted diffusion consistent with expected marginal ischemia. A fluid collection within the inferior aspect of the left middle cranial fossa, likely subdural, at the site of  tumor resection with associated susceptibility artifact related blood products/pneumocephalus measures approximately 46 x 45 x 23 mm, mildly enlarged compared to prior MRI but stable since recent CT. An epidural fluid collection subjacent to the craniotomy now measures 9 mm, unchanged. This epidural collection communicates through the craniotomy to a larger left frontal scalp collection that measures 18 mm in thickness, compared to approximately 14 mm on prior CT. The epidural/subdural fluid collection resulting in mass effect on right frontal lobe with an approximately 4 mm rightward midline shift, stable. Enlarged, partially empty sella likely related to longstanding intracranial hypertension. Vascular: Mildly posteriorly displaced left MCA branches. Flow voids are maintained. Skull and upper cervical spine: Postsurgical changes from left pterional craniotomy. No focal marrow lesion identified. Sinuses/Orbits: Negative. Other: None. IMPRESSION: Stable postsurgical changes in the left frontal lobe with stable subdural/epidural fluid collection but mild increase of the left frontal scalp fluid collection, most consistent with a pseudomeningocele. Stable 4 mm rightward midline shift. Electronically Signed   By: Pedro Earls M.D.   On: 02/22/2023 12:57   No results for input(s): "WBC", "HGB", "HCT", "PLT" in the last 72 hours.  No results for input(s): "NA", "K", "CL", "CO2", "GLUCOSE", "BUN", "CREATININE", "CALCIUM" in the last 72 hours.   Intake/Output Summary (Last 24 hours) at 02/23/2023 1055 Last data filed at 02/23/2023 0532 Gross per 24 hour  Intake 1200  ml  Output --  Net 1200 ml         Physical Exam: Vital Signs Blood pressure 104/61, pulse 85, temperature 99.6 F (37.6 C), temperature source Oral, resp. rate 16, height '5\' 7"'$  (1.702 m), weight 130.7 kg, SpO2 97 %.  Physical Exam Constitutional: Appropriate appearance for age.  No acute distress.  +Obese HENT: No JVD. Neck  Supple. Trachea midline. + Left craniotomy  Eyes:  EOMI. +Discomfort with EOMI in superior > lateral directions - improved Pupils equal round and reactive to light. Left eye. - ptosis now mild Cardiovascular: RRR, no murmurs/rub/gallops.  Peripheral pulses 2+  Respiratory: CTAB. No rales, rhonchi, or wheezing. On RA.  Abdomen: + bowel sounds, normoactive. No distention or tenderness.  Skin: + L craniotomy site with absorbable sutures, site c/d/I, significant localized swelling at inferior incision line appears consistent with prior exams. No tension, warmth, erythema, or expressable discharge.    MSK:      No apparent deformity. + Hyperalgesia along  L forehead to posterior cervical paraspinals       Strength: Moving all 4 limbs antigravity and against resistance, even bilaterally.    Neurologic exam:  Cognition: AAO to person, place, time and event.  No new deficits appreciated from prior exam. Language: Fluent, No substitutions or neoglisms. No dysarthria.  Insight: Good insight into current condition.  Mood: Pleasant affect, appropriate mood.  CN: + L V1 sensory deficit -now with hyperalgesia;   + L ptosis (C/b periorbital edema)-improving   Coordination: No apparent tremors.L>R UE  - mild ataxia.  Spasticity: No notable spasticity or tone in BL UE or Les on exam.      Assessment/Plan: 1. Functional deficits which require 3+ hours per day of interdisciplinary therapy in a comprehensive inpatient rehab setting. Physiatrist is providing close team supervision and 24 hour management of active medical problems listed below. Physiatrist and rehab team continue to assess barriers to discharge/monitor patient progress toward functional and medical goals  Care Tool:  Bathing    Body parts bathed by patient: Right arm, Left arm, Chest, Front perineal area, Abdomen, Buttocks, Right upper leg, Left upper leg, Right lower leg, Left lower leg, Face         Bathing assist Assist Level:  Contact Guard/Touching assist     Upper Body Dressing/Undressing Upper body dressing   What is the patient wearing?: Pull over shirt    Upper body assist Assist Level: Supervision/Verbal cueing    Lower Body Dressing/Undressing Lower body dressing      What is the patient wearing?: Underwear/pull up, Pants     Lower body assist Assist for lower body dressing: Contact Guard/Touching assist     Toileting Toileting    Toileting assist Assist for toileting: Contact Guard/Touching assist     Transfers Chair/bed transfer  Transfers assist     Chair/bed transfer assist level: Contact Guard/Touching assist     Locomotion Ambulation   Ambulation assist      Assist level: Contact Guard/Touching assist Assistive device: Walker-rolling Max distance: 200   Walk 10 feet activity   Assist     Assist level: Contact Guard/Touching assist Assistive device: Walker-rolling   Walk 50 feet activity   Assist    Assist level: Contact Guard/Touching assist Assistive device: Walker-rolling    Walk 150 feet activity   Assist    Assist level: Contact Guard/Touching assist Assistive device: Walker-rolling    Walk 10 feet on uneven surface  activity   Assist Walk 10  feet on uneven surfaces activity did not occur: Safety/medical concerns         Wheelchair     Assist Is the patient using a wheelchair?: No             Wheelchair 50 feet with 2 turns activity    Assist            Wheelchair 150 feet activity     Assist          Blood pressure 104/61, pulse 85, temperature 99.6 F (37.6 C), temperature source Oral, resp. rate 16, height '5\' 7"'$  (1.702 m), weight 130.7 kg, SpO2 97 %.  Medical Problem List and Plan: 1. Functional deficits secondary to meningioma s/p craniotomy             -patient may shower             -ELOS/Goals: 7-10 days, supervision PT/OT  - Stage 2 on pathology; XRT services  starting next Tuesday as OP    - 2/28: Stat CT head for severe worsening headache did not show any significant changes from prior MRI, however difficult to compare.  Patient cleared to resume therapies   - 2/29: New LUE and LLE deficits on PT exam this AM; therapy holds, stat MRI head unchanged, Dr. Zada Finders evalauted, no concerning new neuro deficits; continue current plan of care   2.  Antithrombotics: -DVT/anticoagulation:  Pharmaceutical: Heparin             -antiplatelet therapy: none   3. Pain Management:  Ongoing post-op headache - waxing/waning, overall improving             - Can continue IV toradol for post-op Headache through admission overnight; transition to oral regimen in AM             - Patient endorses nausea/allergy to Vicodin/Norco/Dilaudid - seizures with Morphine - DC and add Gabapentin 300 mg PRN Q8H for pain, HA             - Per chart review ? Allergy to tylenol  - 2/23: Reviewed medications the patient has been able to tolerate, versus recorded allergies.  Adjusted med regimen to gabapentin 200 mg 3 times daily, Tylenol 650 mg scheduled 3 times daily, with as needed Fioricet for headaches, Toradol for moderate pain, and tramadol for severe pain.   -2-24: Symptoms much improved, continue current regimen.  Gabapentin discontinued due to history of lethargy on this medication.   - 2.25-26: stable, monitor   - 2/27: Voltaren BID to neck for cervicalgia   - 2/28: One-time dose of gabapentin 300 mg for severe worsening of headache, now replaced with Topamax 25 mg twice daily as needed   - 2/29: neurosurgery eval as above. Did discuss how return of sensation in L V1 likely d/t decreased swelling, hyperalgesia likely contributing to HA pain.    - 3/1: Increase Topomax to 2 tabs (50 mg) BID. If not tolerating, can decreased to 1 tab BID. Conitnue PRN tramadol and fioricet. Avoid gabapentin and oxycodone d/t lethargy at low doses.   4. Mood/Behavior/Sleep: LCSW to evaluate and provide emotional support              -continue Cymbalta 60 mg daily (depressive disorder) -> increase to 60 mg QAM/30 mg QHS 3/1 for adjustment disorder/worsening depression with new disabilities             -antipsychotic agents: n/a   5. Neuropsych/cognition: This patient is capable of making decisions on  his own behalf. - 2/28: Reported  housing difficulties and may need to go to shelter on discharge; SW following for options, anxiety may contribute to symptoms - 3/1: Neuropsych eval performed, appreciate recommendations   6. Skin/Wound Care: Routine skin care checks   7. Fluids/Electrolytes/Nutrition: Routine Is and Os and follow-up chemistries -Mild AKI on admission labs with creatinine 1.1, BUN 7, along with hyponatremia 134 from 140.  Initiate IV fluids normal saline at 150 cc/h, 1 L total.  Will repeat labs in a.m. 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 -Hemoglobin stable 2-23, 24, 26   11. Constipation. LBM PTA.              - Colace 100 mg BID + PRN Miralax inpatient; increase to BID miralax, Sennakot-S 2 tabs QHS             - PRN sorbitol, MOM, and Enema; encouraged patient to use a PRN tonight             - LBM 2/29  13.  Orthostatic hypotension, with presyncope -resolved- May be due to dehydration with AKI versus adrenal insufficiency.  Replete IV fluids as above, if no improvement this afternoon will order a.m. cortisol with a.m. labs and depending on result may initiate slower steroid taper. -Symptoms resolved with IV fluids, labs stable, a.m. ordered cortisol 17, within normal limits -Headache regimen as above, much better tolerated today  LOS: 8 days A FACE TO [place] C Eduardo Wurth 02/23/2023, 10:55 AM

## 2023-02-23 NOTE — Progress Notes (Signed)
Patient ID: Evelyn [name], female   DOB: 1971-03-04, 52 y.o.   MRN: [id number]  SW received updates from therapy team that they continue recommend outpatient PT/OT and no DME recommended.  62- SW spoke with [name] and his wife to inform on above, and d/c remains Monday. Family intends to discuss housing discharge plan.   SW met with pt and pt mother in room to inform on above.   Loralee Pacas, MSW, [place] Office: [phone] Cell: [phone] Fax: [phone]

## 2023-02-24 NOTE — Progress Notes (Addendum)
Occupational Therapy Session Note  Patient Details  Name: Evelyn [name] MRN: [id number] Date of Birth: December 13, 1971  Today's Date: 02/24/2023 OT Individual Time: 1005-1050 OT Individual Time Calculation (min): 45 min    Short Term Goals: Week 1:  OT Short Term Goal 1 (Week 1): LTG=STG 2/2 ELOS  Skilled Therapeutic Interventions/Progress Updates:    Pt greeted supine in bed and agreeable to OT treatment session. Pt reported her BP had been low today. Orthostatic vitals taken as detailed below. Pt ambulated to bathroom mod I. Pt voided bladder and completed toileting tasks mod I. Pt took seated rest break, then ambulated to therapy gym mod I. OT provided pt with level 2 red and level 3 green theraband along with handout for UB home exercise program. Pt completed each exercise x10. Chest pull, straight arm raise, lateral arm raise, bicep curl, forearm pull. Pt with some pain up R trap towards her scalp. Educated on body mechanics and pain management. Pt ambulated back to room and returned to bed with needs met.   Supine: 99/67 Sitting: 108/69 Standing: 109/70  Therapy Documentation Precautions:  Precautions Precautions: Fall Restrictions Weight Bearing Restrictions: No  Pain:  Pf reports 4/10 headache, rest and repositioned   Therapy/Group: Individual Therapy  Valma Cava 02/24/2023, 10:49 AM

## 2023-02-24 NOTE — Progress Notes (Signed)
Physical Therapy Session Note  Patient Details  Name: Evelyn [name] MRN: [id number] Date of Birth: 01-08-1971  Today's Date: 02/24/2023 PT Individual Time: 0800-0900 PT Individual Time Calculation (min): 60 min   Short Term Goals: Week 1:  PT Short Term Goal 1 (Week 1): STG = LTG due to ELOS  Skilled Therapeutic Interventions/Progress Updates:  Patient greeted supine in bed with mother present and agreeable to PT treatment session. Patient transitioned from supine to sitting EOB independently and was able to don shoes. Patient ambulated to the bathroom without the use of an AD and ModI- Patient completed all toileting tasks independently. RN present to administer medication.   Patient ambulated throughout the fourth floor to various rehab gyms without the use of an AD and ModI- Increased time required to complete all gait trials, however no LOB noted.   Patient performed a car transfer, ambulated up/down a low grade ramp and across mulch without the use of an AD and ModI.   Patient ascended/descended 12 steps with B HR ModI- Patient required increased time to complete stair mobility with various standing rest breaks. Patient used a reciprocal pattern while performing stair mobility with no LOB noted.   Patient required various extended seated rest breaks throughout treatment session secondary to fatigue and reports of head pain- Premedicated prior to start of session.   Patient ambulated back to her room without the use of an AD and ModI- Patient left sitting EOB with call bell within reach, mother present and all needs met.    Therapy Documentation Precautions:  Precautions Precautions: Fall Restrictions Weight Bearing Restrictions: No   Therapy/Group: Individual Therapy  Fatema Rabe 02/24/2023, 7:42 AM

## 2023-02-24 NOTE — Progress Notes (Signed)
Physical Therapy Discharge Summary  Patient Details  Name: Evelyn [name] MRN: [id number] Date of Birth: Feb 06, 1971  Date of Discharge from PT service:{Time; dates multiple:304500300}  {CHL IP REHAB PT TIME CALCULATION:304800500}   Patient has met 8 of 8 long term goals due to improved activity tolerance, improved balance, improved postural control, increased strength, decreased pain, improved attention, improved awareness, and improved coordination.  Patient to discharge at an ambulatory level Modified Independent.   Patient's care partner is independent to provide the necessary  Supervision  assistance as needed at discharge.  Reasons goals not met: NA  Recommendation:  Patient will benefit from ongoing skilled PT services in outpatient setting to continue to advance safe functional mobility, address ongoing impairments in dynamic stability, endurance/activity tolerance, global strengthening, visual deficits and minimize fall risk.  Equipment: No equipment provided  Reasons for discharge: treatment goals met and discharge from hospital  Patient/family agrees with progress made and goals achieved: Yes  PT Discharge Precautions/Restrictions Precautions Precautions: Fall Restrictions Weight Bearing Restrictions: No Pain Interference Pain Interference Pain Effect on Sleep: 2. Occasionally Pain Interference with Therapy Activities: 2. Occasionally Pain Interference with Day-to-Day Activities: 2. Occasionally Vision/Perception  Vision - History Ability to See in Adequate Light: 1 Impaired Vision - Assessment Eye Alignment: Within Functional Limits Ocular Range of Motion: Restricted looking up Alignment/Gaze Preference: Within Defined Limits Tracking/Visual Pursuits: Decreased smoothness of horizontal tracking;Decreased smoothness of vertical tracking Saccades: Decreased speed of saccadic movement Perception Perception: Impaired Inattention/Neglect: Does not attend to  left visual field Praxis Praxis: Intact  Cognition Overall Cognitive Status: Within Functional Limits for tasks assessed Arousal/Alertness: Awake/alert Orientation Level: Oriented X4 Year: 2024 Month: March Day of Week: Correct Selective Attention: Appears intact Memory: Appears intact Awareness: Appears intact Problem Solving: Appears intact Safety/Judgment: Appears intact Sensation Sensation Light Touch: Appears Intact Coordination Gross Motor Movements are Fluid and Coordinated: Yes Fine Motor Movements are Fluid and Coordinated: Yes Motor  Motor Motor: Within Functional Limits  Mobility Bed Mobility Bed Mobility: Rolling Right;Rolling Left;Supine to Sit;Sit to Supine Rolling Right: Independent Rolling Left: Independent Supine to Sit: Independent Sit to Supine: Independent Transfers Transfers: Sit to Stand;Stand to Sit;Stand Pivot Transfers Sit to Stand: Independent with assistive device Stand to Sit: Independent with assistive device Stand Pivot Transfers: Independent with assistive device Transfer (Assistive device): None Locomotion  Gait Ambulation: Yes Gait Assistance: Independent with assistive device Gait Distance (Feet): 150 Feet Assistive device: None Gait Gait: Yes Gait velocity: decreased Stairs / Additional Locomotion Stairs: Yes Stairs Assistance: Supervision/Verbal cueing Stair Management Technique: Two rails Number of Stairs: 12 Height of Stairs: 6 Ramp: Supervision/Verbal cueing Curb: Supervision/Verbal cueing Wheelchair Mobility Wheelchair Mobility: No  Trunk/Postural Assessment  Cervical Assessment Cervical Assessment: Within Functional Limits Thoracic Assessment Thoracic Assessment: Exceptions to [facility] (Rounded shoulders) Lumbar Assessment Lumbar Assessment: Within Functional Limits Postural Control Postural Control: Within Functional Limits Righting Reactions: Delayed stepping strategies  Balance Balance Balance Assessed:  Yes Static Sitting Balance Static Sitting - Balance Support: Feet supported;No upper extremity supported Static Sitting - Level of Assistance: 7: Independent Dynamic Sitting Balance Dynamic Sitting - Balance Support: Feet supported;No upper extremity supported Dynamic Sitting - Level of Assistance: 7: Independent Static Standing Balance Static Standing - Balance Support: No upper extremity supported Static Standing - Level of Assistance: 6: Modified independent (Device/Increase time) Dynamic Standing Balance Dynamic Standing - Balance Support: No upper extremity supported;During functional activity Dynamic Standing - Level of Assistance: 6: Modified independent (Device/Increase time) Extremity Assessment  RLE Assessment RLE Assessment: Within Functional Limits General Strength Comments: Grossly 4+/5 LLE Assessment LLE Assessment: Within Functional Limits General Strength Comments: Grossly 4+/5   Evelyn [name] 02/24/2023, 8:20 AM

## 2023-02-24 NOTE — Progress Notes (Signed)
Scheduled tylenol and robaxin given at [place], for C/O HA. Declined miralax & senna s. LBM 03/01. At 2106, requested sleep med, PRN trazodone '25mg'$ 's given. Swelling observed to left peri orbital area, not new. Patient's mom at bedside. Patrici Ranks A

## 2023-02-24 NOTE — Progress Notes (Shared)
Occupational Therapy Discharge Summary  Patient Details  Name: Evelyn [name] MRN: [id number] Date of Birth: [birth year]  Date of Discharge from OT service:{Time; dates multiple:304500300}  {CHL IP REHAB OT TIME CALCULATIONS:304400400}   Patient has met 11 of 11 long term goals due to improved activity tolerance, improved balance, postural control, and ability to compensate for deficits.  Patient to discharge at overall modified Independent level.  Patient's care partner is independent to provide the necessary physical assistance at discharge for higher leval iADL tasks..     Reasons goals not met: n/a  Recommendation:  Patient will benefit from ongoing skilled OT services in outpatient setting to continue to advance functional skills in the area of iADL.  Equipment: No equipment provided  Reasons for discharge: treatment goals met and discharge from hospital  Patient/family agrees with progress made and goals achieved: Yes  OT Discharge Precautions/Restrictions  Precautions Precautions: Fall Restrictions Weight Bearing Restrictions: No General   Vital Signs  Pain   ADL ADL Eating: Independent Grooming: Independent Upper Body Bathing: Modified independent Lower Body Bathing: Modified independent Upper Body Dressing: Independent Lower Body Dressing: Independent Toileting: Independent Toilet Transfer: Independent Social research officer, government: Modified independent Vision Baseline Vision/History: 1 Wears glasses (Wears glasses all the time, however does not wear them in the hospital secondary to L face swelling) Patient Visual Report: Blurring of vision;Eye fatigue/eye pain/headache Eye Alignment: Within Functional Limits Ocular Range of Motion: Restricted looking up Alignment/Gaze Preference: Within Defined Limits Tracking/Visual Pursuits: Decreased smoothness of horizontal tracking;Decreased smoothness of vertical tracking Saccades: Decreased speed of saccadic  movement Perception  Perception: Impaired Inattention/Neglect: Does not attend to left visual field (improved since eval) Praxis Praxis: Intact Cognition Cognition Overall Cognitive Status: Within Functional Limits for tasks assessed Arousal/Alertness: Awake/alert Orientation Level: Place;Person;Situation Person: Oriented Place: Oriented Situation: Oriented Memory: Appears intact Attention: Selective Selective Attention: Appears intact Awareness: Appears intact Problem Solving: Appears intact Safety/Judgment: Appears intact Brief Interview for Mental Status (BIMS) Repetition of Three Words (First Attempt): 3 Temporal Orientation: Year: Correct Temporal Orientation: Month: Accurate within 5 days Temporal Orientation: Day: Correct Recall: "Sock": Yes, no cue required Recall: "Blue": Yes, no cue required Recall: "Bed": Yes, no cue required BIMS Summary Score: 15 Sensation Sensation Light Touch: Appears Intact Coordination Gross Motor Movements are Fluid and Coordinated: Yes Fine Motor Movements are Fluid and Coordinated: Yes Motor  Motor Motor: Within Functional Limits Motor - Discharge Observations: improved strength and endurance Mobility  Bed Mobility Bed Mobility: Rolling Right;Rolling Left;Supine to Sit;Sit to Supine Rolling Right: Independent Rolling Left: Independent Supine to Sit: Independent Sit to Supine: Independent Transfers Sit to Stand: Independent with assistive device Stand to Sit: Independent with assistive device  Trunk/Postural Assessment  Cervical Assessment Cervical Assessment: Within Functional Limits Thoracic Assessment Thoracic Assessment: Exceptions to [facility] (Rounded shoulders) Lumbar Assessment Lumbar Assessment: Within Functional Limits Postural Control Postural Control: Within Functional Limits Righting Reactions: Delayed stepping strategies  Balance Balance Balance Assessed: Yes Static Sitting Balance Static Sitting - Balance  Support: Feet supported;No upper extremity supported Static Sitting - Level of Assistance: 7: Independent Dynamic Sitting Balance Dynamic Sitting - Balance Support: Feet supported;No upper extremity supported Dynamic Sitting - Level of Assistance: 7: Independent Static Standing Balance Static Standing - Balance Support: No upper extremity supported Static Standing - Level of Assistance: 6: Modified independent (Device/Increase time) Dynamic Standing Balance Dynamic Standing - Balance Support: No upper extremity supported;During functional activity Dynamic Standing - Level of Assistance: 6: Modified independent (Device/Increase time) Extremity/Trunk Assessment RUE Assessment  RUE Assessment: Within Functional Limits LUE Assessment LUE Assessment: Within Functional Limits   Valma Cava 02/24/2023, 11:41 AM

## 2023-02-24 NOTE — Progress Notes (Signed)
PROGRESS NOTE   Subjective/Complaints:   No events overnight.Tired after PT this am , mother at bedside, states pt was restless last noc, had PT already this am   ROS: + HA, and L eye pain   Objective:   MR BRAIN WO CONTRAST  Result Date: 02/22/2023 CLINICAL DATA:  Headache, neuro deficit. EXAM: MRI HEAD WITHOUT CONTRAST TECHNIQUE: Multiplanar, multiecho pulse sequences of the brain and surrounding structures were obtained without intravenous contrast. COMPARISON:  Head CT February 21, 2023; MRI of the brain February 08, 2019. 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 Area of encephalomalacia and gliosis in the inferior left frontal lobe with peripheral restricted diffusion consistent with expected marginal ischemia. A fluid collection within the inferior aspect of the left middle cranial fossa, likely subdural, at the site of tumor resection with associated susceptibility artifact related blood products/pneumocephalus measures approximately 46 x 45 x 23 mm, mildly enlarged compared to prior MRI but stable since recent CT. An epidural fluid collection subjacent to the craniotomy now measures 9 mm, unchanged. This epidural collection communicates through the craniotomy to a larger left frontal scalp collection that measures 18 mm in thickness, compared to approximately 14 mm on prior CT. The epidural/subdural fluid collection resulting in mass effect on right frontal lobe with an approximately 4 mm rightward midline shift, stable. Enlarged, partially empty sella likely related to longstanding intracranial hypertension. Vascular: Mildly posteriorly displaced left MCA branches. Flow voids are maintained. Skull and upper cervical spine: Postsurgical changes from left pterional craniotomy. No focal marrow lesion identified. Sinuses/Orbits: Negative. Other: None.  IMPRESSION: Stable postsurgical changes in the left frontal lobe with stable subdural/epidural fluid collection but mild increase of the left frontal scalp fluid collection, most consistent with a pseudomeningocele. Stable 4 mm rightward midline shift. Electronically Signed   By: Pedro Earls M.D.   On: 02/22/2023 12:57   No results for input(s): "WBC", "HGB", "HCT", "PLT" in the last 72 hours.  No results for input(s): "NA", "K", "CL", "CO2", "GLUCOSE", "BUN", "CREATININE", "CALCIUM" in the last 72 hours.   Intake/Output Summary (Last 24 hours) at 02/24/2023 0924 Last data filed at 02/24/2023 [id number] Gross per 24 hour  Intake 840 ml  Output --  Net 840 ml         Physical Exam: Vital Signs Blood pressure (!) 97/52, pulse 84, temperature 99.3 F (37.4 C), temperature source Oral, resp. rate 18, height '5\' 7"'$  (1.702 m), weight 130.7 kg, SpO2 100 %.  Physical Exam Constitutional: Appropriate appearance for age.  No acute distress.  +Obese HENT: No JVD. Neck Supple. Trachea midline. + Left craniotomy  Eyes:  Pt has persistent upward gaze on left that corrects to midline in ~2s Lateral tracking normal bilateral V1 sensation normal to LT Left eye. - ptosis now mild Cardiovascular: RRR, no murmurs/rub/gallops.  Peripheral pulses 2+  Respiratory: CTAB. No rales, rhonchi, or wheezing. On RA.  Abdomen: + bowel sounds, normoactive. No distention or tenderness.  Skin: + L craniotomy site with absorbable sutures, site c/d/I, significant localized swelling at inferior incision line appears consistent with prior exams. No tension, warmth, erythema,  or expressable discharge.    MSK:   no limitation in UE or LE ROM, no joint swelling         Neurologic exam:  Cognition: AAO to person, place, time and event.  No new deficits appreciated from prior exam. Language: Fluent, No substitutions or neoglisms. No dysarthria.  Insight: Good insight into current condition.  Mood: Pleasant  affect, appropriate mood.  CN: + L V1 sensory deficit -now with hyperalgesia;     Spasticity: No notable spasticity or tone in BL UE or Les on exam.      Assessment/Plan: 1. Functional deficits which require 3+ hours per day of interdisciplinary therapy in a comprehensive inpatient rehab setting. Physiatrist is providing close team supervision and 24 hour management of active medical problems listed below. Physiatrist and rehab team continue to assess barriers to discharge/monitor patient progress toward functional and medical goals  Care Tool:  Bathing    Body parts bathed by patient: Right arm, Left arm, Chest, Front perineal area, Abdomen, Buttocks, Right upper leg, Left upper leg, Right lower leg, Left lower leg, Face         Bathing assist Assist Level: Contact Guard/Touching assist     Upper Body Dressing/Undressing Upper body dressing   What is the patient wearing?: Pull over shirt    Upper body assist Assist Level: Supervision/Verbal cueing    Lower Body Dressing/Undressing Lower body dressing      What is the patient wearing?: Underwear/pull up, Pants     Lower body assist Assist for lower body dressing: Contact Guard/Touching assist     Toileting Toileting    Toileting assist Assist for toileting: Contact Guard/Touching assist     Transfers Chair/bed transfer  Transfers assist     Chair/bed transfer assist level: Independent     Locomotion Ambulation   Ambulation assist      Assist level: Independent with assistive device Assistive device: No Device Max distance: 200+   Walk 10 feet activity   Assist     Assist level: Independent with assistive device Assistive device: No Device   Walk 50 feet activity   Assist    Assist level: Independent with assistive device Assistive device: No Device    Walk 150 feet activity   Assist    Assist level: Independent with assistive device Assistive device: No Device    Walk 10  feet on uneven surface  activity   Assist Walk 10 feet on uneven surfaces activity did not occur: Safety/medical concerns   Assist level: Independent with assistive device Assistive device: Other (comment) (No AD)   Wheelchair     Assist Is the patient using a wheelchair?: No             Wheelchair 50 feet with 2 turns activity    Assist            Wheelchair 150 feet activity     Assist          Blood pressure (!) 97/52, pulse 84, temperature 99.3 F (37.4 C), temperature source Oral, resp. rate 18, height '5\' 7"'$  (1.702 m), weight 130.7 kg, SpO2 100 %.  Medical Problem List and Plan: 1. Functional deficits secondary to meningioma s/p craniotomy             -patient may shower             -ELOS/Goals: 7-10 days, supervision PT/OT  - Stage 2 on pathology; XRT services  starting next Tuesday as OP   -  2/28: Stat CT head for severe worsening headache did not show any significant changes from prior MRI, however difficult to compare.  Patient cleared to resume therapies   - 2/29: New LUE and LLE deficits on PT exam this AM; therapy holds, stat MRI head unchanged, Dr. Zada Finders evalauted, no concerning new neuro deficits; continue current plan of care  Motor exam at baseline this am 3/2 2.  Antithrombotics: -DVT/anticoagulation:  Pharmaceutical: Heparin             -antiplatelet therapy: none   3. Pain Management:  Ongoing post-op headache - waxing/waning, overall improving             - Can continue IV toradol for post-op Headache through admission overnight; transition to oral regimen in AM             - Patient endorses nausea/allergy to Vicodin/Norco/Dilaudid - seizures with Morphine - DC and add Gabapentin 300 mg PRN Q8H for pain, HA             - Per chart review ? Allergy to tylenol  - 2/23: Reviewed medications the patient has been able to tolerate, versus recorded allergies.  Adjusted med regimen to gabapentin 200 mg 3 times daily, Tylenol 650 mg  scheduled 3 times daily, with as needed Fioricet for headaches, Toradol for moderate pain, and tramadol for severe pain.   -2-24: Symptoms much improved, continue current regimen.  Gabapentin discontinued due to history of lethargy on this medication.   - 2.25-26: stable, monitor   - 2/27: Voltaren BID to neck for cervicalgia   - 2/28: One-time dose of gabapentin 300 mg for severe worsening of headache, now replaced with Topamax 25 mg twice daily as needed   - 2/29: neurosurgery eval as above. Did discuss how return of sensation in L V1 likely d/t decreased swelling, hyperalgesia likely contributing to HA pain.    - 3/1: Increase Topomax to 2 tabs (50 mg) BID. If not tolerating, can decreased to 1 tab BID. Conitnue PRN tramadol and fioricet. Avoid gabapentin and oxycodone d/t lethargy at low doses.   4. Mood/Behavior/Sleep: LCSW to evaluate and provide emotional support             -continue Cymbalta 60 mg daily (depressive disorder) -> increase to 60 mg QAM/30 mg QHS 3/1 for adjustment disorder/worsening depression with new disabilities             -antipsychotic agents: n/a   5. Neuropsych/cognition: This patient is capable of making decisions on his own behalf. - 2/28: Reported  housing difficulties and may need to go to shelter on discharge; SW following for options, anxiety may contribute to symptoms - 3/1: Neuropsych eval performed, appreciate recommendations   6. Skin/Wound Care: Routine skin care checks   7. Fluids/Electrolytes/Nutrition: Routine Is and Os and follow-up chemistries -Mild AKI on admission labs with creatinine 1.1, BUN 7, along with hyponatremia 134 from 140.  Initiate IV fluids normal saline at 150 cc/h, 1 L total.  Will repeat labs in a.m. 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 -Hemoglobin stable 2-23, 24, 26   11. Constipation. LBM PTA.              - Colace 100 mg BID + PRN Miralax inpatient; increase to BID  miralax, Sennakot-S 2 tabs QHS             - PRN sorbitol, MOM, and Enema; encouraged patient to use a PRN tonight             - LBM 2/29  13.  Orthostatic hypotension, with presyncope -resolved- off IVF  LOS: 9 days A FACE TO FACE EVALUATION WAS PERFORMED  Charlett Blake 02/24/2023, 9:24 AM

## 2023-02-25 MED ORDER — BUTALBITAL-APAP-CAFFEINE 50-325-40 MG PO TABS
2.0000 | ORAL_TABLET | Freq: Four times a day (QID) | ORAL | Status: DC | PRN
  Administered 2023-02-25: 2 via ORAL
  Filled 2023-02-25: qty 2

## 2023-02-25 NOTE — Progress Notes (Signed)
Physical Therapy Session Note  Patient Details  Name: Evelyn [name] MRN: [id number] Date of Birth: 1971/07/24  Today's Date: 02/25/2023 PT Individual Time: 1303-1330 PT Individual Time Calculation (min): 27 min   Short Term Goals: Week 1:  PT Short Term Goal 1 (Week 1): STG = LTG due to ELOS  Skilled Therapeutic Interventions/Progress Updates: Pt presented in bed with mother and sister present. Pt c/o increasing HA, dizziness, and nausea which pt states has improved from earlier in day as received meds. With encouragement pt agreeable to EOB and was able to perform independently. Pt agreeable to stand but requesting to use RW. Pt was able to stand from lowered bed with close supervision (due to pt feeling off). Pt stating her legs feel like "oodles of noodles" but was able to perform standing marches with CGA but mild unsteadiness noted. Pt returned to sitting and discussed upcoming d/c with family stating will continue to work on ambulation. Pt ultimately returned to bed due to increased nausea independently. Pt states this has happened before and usually takes about 2 days to resolve. Pt states will continue working on eye exercises and will set up appt with eye doctor next week. Pt left in bed at end of session resting comfortably with current needs met and x 4 rails up per pt request.      Therapy Documentation Precautions:  Precautions Precautions: Fall Restrictions Weight Bearing Restrictions: No General: PT Amount of Missed Time (min): 18 Minutes PT Missed Treatment Reason: Pain;Patient ill (Comment) Vital Signs: Therapy Vitals Temp: 98.2 F (36.8 C) Temp Source: Oral Pulse Rate: 96 Resp: 16 BP: 103/72 Patient Position (if appropriate): Lying Oxygen Therapy SpO2: 99 % O2 Device: Room Air Pain: Pain Assessment Pain Score: 5  Mobility:   Locomotion :    Trunk/Postural Assessment :    Balance:   Exercises:   Other Treatments:      Therapy/Group:  Individual Therapy  Anuhea Gassner 02/25/2023, 1:44 PM

## 2023-02-25 NOTE — Plan of Care (Signed)
  Problem: RH Balance Goal: LTG Patient will maintain dynamic standing with ADLs (OT) Description: LTG:  Patient will maintain dynamic standing balance with assist during activities of daily living (OT)  Outcome: Completed/Met   Problem: Sit to Stand Goal: LTG:  Patient will perform sit to stand in prep for activites of daily living with assistance level (OT) Description: LTG:  Patient will perform sit to stand in prep for activites of daily living with assistance level (OT) Outcome: Completed/Met   Problem: RH Eating Goal: LTG Patient will perform eating w/assist, cues/equip (OT) Description: LTG: Patient will perform eating with assist, with/without cues using equipment (OT) Outcome: Completed/Met   Problem: RH Grooming Goal: LTG Patient will perform grooming w/assist,cues/equip (OT) Description: LTG: Patient will perform grooming with assist, with/without cues using equipment (OT) Outcome: Completed/Met   Problem: RH Bathing Goal: LTG Patient will bathe all body parts with assist levels (OT) Description: LTG: Patient will bathe all body parts with assist levels (OT) Outcome: Completed/Met   Problem: RH Dressing Goal: LTG Patient will perform upper body dressing (OT) Description: LTG Patient will perform upper body dressing with assist, with/without cues (OT). Outcome: Completed/Met Goal: LTG Patient will perform lower body dressing w/assist (OT) Description: LTG: Patient will perform lower body dressing with assist, with/without cues in positioning using equipment (OT) Outcome: Completed/Met   Problem: RH Toileting Goal: LTG Patient will perform toileting task (3/3 steps) with assistance level (OT) Description: LTG: Patient will perform toileting task (3/3 steps) with assistance level (OT)  Outcome: Completed/Met   Problem: RH Vision Goal: RH LTG Vision (Specify) Outcome: Completed/Met   Problem: RH Toilet Transfers Goal: LTG Patient will perform toilet transfers  w/assist (OT) Description: LTG: Patient will perform toilet transfers with assist, with/without cues using equipment (OT) Outcome: Completed/Met   Problem: RH Tub/Shower Transfers Goal: LTG Patient will perform tub/shower transfers w/assist (OT) Description: LTG: Patient will perform tub/shower transfers with assist, with/without cues using equipment (OT) Outcome: Completed/Met

## 2023-02-25 NOTE — Progress Notes (Signed)
Patient reports increase nausea, no vomiting, over past 36 hours. At 1904-PRN maalox given.1948-PRN fioricet . Agreed to take scheduled miralax & senna S. 2104-Robaxin & trazodone '50mg'$ 's given. At 2117,After going to [facility], patient complained of nausea, PRN compazine '5mg'$ 's given PO. At [place], patient requesting warm blanket, complaining of chills and HA, vitals checked WNL, PRN fioricet given. Edema to Left periorbital area, not new, but appears more swollen. Mom at bedside. Evelyn [name] A

## 2023-02-25 NOTE — Progress Notes (Signed)
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 No results for input(s): "WBC", "HGB", "HCT", "PLT" in the last 72 hours.  No results for input(s): "NA", "K", "CL", "CO2", "GLUCOSE", "BUN", "CREATININE", "CALCIUM" in the last 72 hours.   Intake/Output Summary (Last 24 hours) at 02/25/2023 0929 Last data filed at 02/24/2023 2300 Gross per 24 hour  Intake 480 ml  Output --  Net 480 ml         Physical Exam: Vital Signs Blood pressure 107/84, pulse 89, temperature 98.4 F (36.9 C), resp. rate 20, height '5\' 7"'$  (1.702 m), weight 130.7 kg, SpO2 100 %.  Physical Exam Constitutional: Appropriate appearance for age.  No acute distress.  +Obese HENT: No JVD. Neck Supple. Trachea midline. + Left craniotomy  Eyes:  Pt has persistent upward gaze on left that corrects to midline in ~2s  Cardiovascular: RRR, no murmurs/rub/gallops.  Peripheral pulses 2+  Respiratory: CTAB. No rales, rhonchi, or wheezing. On RA.  Abdomen: + bowel sounds, normoactive. No distention or tenderness.  Skin: + L craniotomy site with frontal swelling , non erythematous, non indurated  No tension, warmth, erythema, or expressable discharge.    Neuro:  Eyes without evidence of nystagmus  Tone is normal without evidence of spasticity Cerebellar exam shows no evidence of ataxia on finger nose finger or heel to shin testing No evidence of trunkal ataxia  Motor strength is 5/5 in bilateral deltoid, biceps, triceps, finger flexors and extensors, wrist flexors and extensors, hip flexors, knee flexors and extensors, ankle dorsiflexors, plantar flexors, invertors and evertors, toe flexors and extensors  Sensory exam is normal to  light touch in the upper and lower limbs   Cranial nerves II- Visual  fields are intact to confrontation testing, no blurring of vision III- no evidence of ptosis,  reduced L>R upward gaze, downward and medial gaze intact IV- no vertical diplopia or head tilt V- Left V1 hypersensitivity VI- no pupil abduction weakness VII- no facial droop, good lid closure VII- normal auditory acuity IX- no pharygeal weakness, gag nl X- no pharyngeal weakness, no hoarseness XI- no trap or SCM weakness XII- no glossal weakness       Assessment/Plan: 1. Functional deficits which require 3+ hours per day of interdisciplinary therapy in a comprehensive inpatient rehab setting. Physiatrist is providing close team supervision and 24 hour management of active medical problems listed below. Physiatrist and rehab team continue to assess barriers to discharge/monitor patient progress toward functional and medical goals  Care Tool:  Bathing    Body parts bathed by patient: Chest, Left arm, Right arm, Abdomen, Front perineal area, Buttocks, Right upper leg, Left upper leg, Right lower leg, Left lower leg, Face         Bathing assist Assist Level: Independent     Upper Body Dressing/Undressing Upper body dressing   What is the patient wearing?: Pull over shirt, Bra    Upper body assist Assist Level: Independent    Lower Body Dressing/Undressing Lower body dressing  What is the patient wearing?: Pants, Underwear/pull up     Lower body assist Assist for lower body dressing: Independent     Toileting Toileting    Toileting assist Assist for toileting: Independent     Transfers Chair/bed transfer  Transfers assist     Chair/bed transfer assist level: Independent     Locomotion Ambulation   Ambulation assist      Assist level: Independent with assistive device Assistive device: No Device Max distance: 200+   Walk 10 feet activity   Assist     Assist level: Independent with assistive device Assistive device: No Device   Walk 50  feet activity   Assist    Assist level: Independent with assistive device Assistive device: No Device    Walk 150 feet activity   Assist    Assist level: Independent with assistive device Assistive device: No Device    Walk 10 feet on uneven surface  activity   Assist Walk 10 feet on uneven surfaces activity did not occur: Safety/medical concerns   Assist level: Independent with assistive device Assistive device: Other (comment) (No AD)   Wheelchair     Assist Is the patient using a wheelchair?: No             Wheelchair 50 feet with 2 turns activity    Assist            Wheelchair 150 feet activity     Assist          Blood pressure 107/84, pulse 89, temperature 98.4 F (36.9 C), resp. rate 20, height '5\' 7"'$  (1.702 m), weight 130.7 kg, SpO2 100 %.  Medical Problem List and Plan: 1. Functional deficits secondary to meningioma s/p craniotomy             -patient may shower             -ELOS/Goals: 7-10 days, supervision PT/OT  - Stage 2 on pathology; XRT services  starting next Tuesday as OP   - 2/28: Stat CT head for severe worsening headache did not show any significant changes from prior MRI, however difficult to compare.  Patient cleared to resume therapies   - 2/29: New LUE and LLE deficits on PT exam this AM; therapy holds, stat MRI head unchanged, Dr. Zada Finders evalauted, no concerning new neuro deficits; continue current plan of care  Motor exam at baseline this am 3/2 2.  Antithrombotics: -DVT/anticoagulation:  Pharmaceutical: Heparin             -antiplatelet therapy: none   3. Pain Management:  Ongoing post-op headache - waxing/waning, overall improving                        -   -2-24: Symptoms much improved, continue current regimen.  Gabapentin discontinued due to history of lethargy on this medication.   - 2.25-26: stable, monitor   - 2/27: Voltaren BID to neck for cervicalgia   - 2/28: One-time dose of gabapentin 300 mg  for severe worsening of headache, now replaced with Topamax 25 mg twice daily as needed   - 2/29: neurosurgery eval as above. Did discuss how return of sensation in L V1 likely d/t decreased swelling, hyperalgesia likely contributing to HA pain.    - 3/1: Increase Topomax to 2 tabs (50 mg) BID. If not tolerating, can decreased to 1 tab BID. Conitnue PRN tramadol and fioricet increase to 2 tabs . Avoid gabapentin and oxycodone d/t lethargy  at low doses.   4. Mood/Behavior/Sleep: LCSW to evaluate and provide emotional support             -continue Cymbalta 60 mg daily (depressive disorder) -> increase to 60 mg QAM/30 mg QHS 3/1 for adjustment disorder/worsening depression with new disabilities             -antipsychotic agents: n/a   5. Neuropsych/cognition: This patient is capable of making decisions on his own behalf. - 2/28: Reported  housing difficulties and may need to go to shelter on discharge; SW following for options, anxiety may contribute to symptoms - 3/1: Neuropsych eval performed, appreciate recommendations   6. Skin/Wound Care: Routine skin care checks   7. Fluids/Electrolytes/Nutrition: Routine Is and Os and follow-up chemistries -Mild AKI on admission labs with creatinine 1.1, BUN 7, along with hyponatremia 134 from 140.  Initiate IV fluids normal saline at 150 cc/h, 1 L total.  Will repeat labs in a.m. 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 -Hemoglobin stable 2-23, 24, 26   11. Constipation. LBM PTA.              - Colace 100 mg BID + PRN Miralax inpatient; increase to BID miralax, Sennakot-S 2 tabs QHS             - PRN sorbitol, MOM, and Enema; encouraged patient to use a PRN tonight             - LBM 2/29  13.  Orthostatic hypotension, with presyncope  -resolved- off IVF  LOS: 10 days A FACE TO Anderson E Eniyah Eastmond 02/25/2023, 9:29 AM

## 2023-02-26 ENCOUNTER — Ambulatory Visit: Payer: Medicaid Other | Admitting: Radiation Oncology

## 2023-02-26 ENCOUNTER — Ambulatory Visit: Payer: Self-pay | Admitting: Radiation Oncology

## 2023-02-26 ENCOUNTER — Other Ambulatory Visit (HOSPITAL_COMMUNITY): Payer: Self-pay

## 2023-02-26 DIAGNOSIS — E871 Hypo-osmolality and hyponatremia: Secondary | ICD-10-CM

## 2023-02-26 DIAGNOSIS — D649 Anemia, unspecified: Secondary | ICD-10-CM

## 2023-02-26 DIAGNOSIS — K5900 Constipation, unspecified: Secondary | ICD-10-CM

## 2023-02-26 LAB — BASIC METABOLIC PANEL
Anion gap: 5 (ref 5–15)
BUN: 17 mg/dL (ref 6–20)
CO2: 23 mmol/L (ref 22–32)
Calcium: 8.6 mg/dL — ABNORMAL LOW (ref 8.9–10.3)
Chloride: 104 mmol/L (ref 98–111)
Creatinine, Ser: 1.05 mg/dL — ABNORMAL HIGH (ref 0.44–1.00)
GFR, Estimated: >60 mL/min (ref >60)
Glucose, Bld: 94 mg/dL (ref 70–99)
Potassium: 4 mmol/L (ref 3.5–5.1)
Sodium: 132 mmol/L — ABNORMAL LOW (ref 135–145)

## 2023-02-26 LAB — CBC
HCT: 28.7 % — ABNORMAL LOW (ref 36.0–46.0)
Hemoglobin: 9.4 g/dL — ABNORMAL LOW (ref 12.0–15.0)
MCH: 26.4 pg (ref 26.0–34.0)
MCHC: 32.8 g/dL (ref 30.0–36.0)
MCV: 80.6 fL (ref 80.0–100.0)
Platelets: 321 10*3/uL (ref 150–400)
RBC: 3.56 MIL/uL — ABNORMAL LOW (ref 3.87–5.11)
RDW: 14 % (ref 11.5–15.5)
WBC: 4.5 10*3/uL (ref 4.0–10.5)
nRBC: 0 % (ref 0.0–0.2)

## 2023-02-26 MED ORDER — DICLOFENAC SODIUM 1 % EX GEL
2.0000 g | Freq: Two times a day (BID) | CUTANEOUS | 0 refills | Status: DC
  Filled 2023-02-26: qty 100, 25d supply, fill #0

## 2023-02-26 MED ORDER — VITAMIN D (ERGOCALCIFEROL) 1.25 MG (50000 UNIT) PO CAPS
50000.0000 [IU] | ORAL_CAPSULE | ORAL | 0 refills | Status: AC
  Filled 2023-02-26: qty 5, 30d supply, fill #0

## 2023-02-26 MED ORDER — LEVETIRACETAM 750 MG PO TABS
750.0000 mg | ORAL_TABLET | Freq: Two times a day (BID) | ORAL | 0 refills | Status: DC
  Filled 2023-02-26: qty 60, 30d supply, fill #0

## 2023-02-26 MED ORDER — POLYETHYLENE GLYCOL 3350 17 G PO PACK: 17.0000 g | PACK | Freq: Two times a day (BID) | ORAL | 0 refills | Status: AC

## 2023-02-26 MED ORDER — DULOXETINE HCL 30 MG PO CPEP
ORAL_CAPSULE | ORAL | 0 refills | Status: DC
  Filled 2023-02-26: qty 90, 30d supply, fill #0

## 2023-02-26 MED ORDER — FAMOTIDINE 20 MG PO TABS
20.0000 mg | ORAL_TABLET | Freq: Two times a day (BID) | ORAL | 0 refills | Status: AC
  Filled 2023-02-26: qty 60, 30d supply, fill #0

## 2023-02-26 MED ORDER — BUTALBITAL-APAP-CAFFEINE 50-325-40 MG PO TABS
2.0000 | ORAL_TABLET | Freq: Four times a day (QID) | ORAL | 0 refills | Status: DC | PRN
  Filled 2023-02-26: qty 14, 3d supply, fill #0

## 2023-02-26 MED ORDER — TOPIRAMATE 50 MG PO TABS
50.0000 mg | ORAL_TABLET | Freq: Two times a day (BID) | ORAL | 0 refills | Status: DC
  Filled 2023-02-26: qty 60, 30d supply, fill #0

## 2023-02-26 MED ORDER — METHOCARBAMOL 500 MG PO TABS
500.0000 mg | ORAL_TABLET | Freq: Four times a day (QID) | ORAL | 0 refills | Status: DC | PRN
  Filled 2023-02-26: qty 60, 15d supply, fill #0

## 2023-02-26 NOTE — Progress Notes (Incomplete)
Radiation Oncology         [phone]) [phone] ________________________________  Follow-up New Visit   Outpatient   Name: Evelyn [name] MRN: [id number]  Date: 02/27/2023  DOB: January 07, 1971  CC:Loyola Mast, PA-C  Loyola Mast, PA-C   REFERRING PHYSICIAN: Annye English  DIAGNOSIS: D42.0   ICD-10-CM   1. Meningioma, cerebral (HCC)  D32.0 acetaminophen (TYLENOL) tablet 650 mg    2. 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 female who returns today for a consent session / treatment planning prior to initiating radiation therapy. To review from her initial consultation visit on 02/20/23, I recommended 6 weeks of daily (M-F) radiation therapy to the tumor bed/surgical cavity with margin to decrease the risk of disease recurrence.   The patient was discharged from rehab yesterday. Following her procedure (while still admitted), the patient endorsed an increase in severity and frequency of her headaches on 02/28. She was given a single dose of gabapentin. CT of the head without contrast on 02/21/23 showed relatively stable findings including: stable edema in the resection cavity with locules of gas, an approximately 5 mm midline shift, and an approximately 6 mm thick overlying extra-axial fluid collection. CT however noted increase edema/fluid overlying the left frontal craniotomy site (indeterminate by imaging).   The following morning on 02/29, the patient endorsed a persistent severe headache as well as new neuro deficits including LLE weakness and LUE spasms. She also complained of severe pain and tension across the L V1 distribution where she was previously experiencing numbness.  A stat MRI of the brain without contrast was performed on 02/22/23 which demonstrated stable  postsurgical changes in the left frontal lobe, a stable subdural / epidural fluid collection but a mild increase of the left frontal scalp fluid collection (most consistent with a pseudomeningocele), and a stable 4 mm rightward midline shift. Dr. Zada Finders also evaluated the patient and reassured her that no acute events or changes were seen on the above imaging studies.   Medications at discharge on 02/26/23: 1.  Tylenol as needed 2.  Fioricet 50-325-40 mg 2 tablets every 6 hours as needed headache 3.  Voltaren gel 2 g twice daily 4.  Cymbalta 30 mg p.o. nightly 2200 5.  Cymbalta 60 mg p.o. daily 0800 6.  Pepcid 20 mg p.o. twice daily 7.  Keppra 750 mg p.o. twice daily 8.  Robaxin 500 mg p.o. every 6 hours as needed muscle spasms 9.  MiraLAX twice daily hold for loose stools 10.  Topamax 50 mg p.o. twice daily 11.  Senokot S2 tablets daily hold for loose stools 12.  Vitamin D 50,000 units weekly  HPI Initial Consultation 02/20/23 :Evelyn [name] is a 52 y.o. female who presented to the ED on 12/20/22 with c/o of recurrent and increasing episodes of near syncope/blackout spells over the last year. Per encounter notes, the patient characterized that she would be completely fine, and then suddenly begin to stare off into space and become non-responsive. She denied any falls or seizure like activity. She also noted a history of migraines when she was younger, and a recent resurgence of her migrainous type headaches, which did not always coincide with her blackout episodes. ED work-up was unremarkable, and she was discharged with a referral placed  to neurology for further evaluation.    The patient accordingly met with Neurology on 01/03/23. During this visit, the patient also endorsed recurrent headaches. In light of this, an MRI of the brain was ordered and the patient was started on Zonisamide.    Subsequent MRI of the brain on 01/17/23 demonstrated a large left orbital frontal meningeal base  mass measuring 5.6 x 5.5 x 2.8 cm, with intense enhancement, mild cytotoxic edema, and left-to-right brain herniation, like representing an orbitofrontal meningioma.    Subsequently, the patient was referred to neurosurgery and was admitted on 02/08/23 for craniotomy for tumor resection under the care of Dr. Zada Finders. Pathology from the procedure revealed WHO grade 2 atypical meningioma.    Post-op MRI of the brain on 02/08/23 showed: decreased mass effect on the left frontal lobe, improvement in size of the left lateral ventricle, and a residual 5 mm left to right midline shift (previously 9 mm when remeasured similarly). MRI also showed a 6 mm extra-axial fluid collection along the left frontal convexity located subjacent to the craniotomy flap.    The patient was started on keppra following her procedure, and she was admitted to inpatient rehab on 02/15/23. Her main complaints at this time included decrease vision acuity of left eye due to swelling and myopia, as well as constipation, nausea, and dizziness due to hydrocodone (which has since been discontinued). She is currently receiving toradol for her headaches.   Today, patient is present with her daughter-in-law and son, who is her healthcare power of attorney, via phone today. She states that she is still experiencing significant headaches. She notices her headaches are worse since having the surgery. She is currently taking topamax, fioricet, and tylenol to help control the pain. She has some swelling along her temple which prevents her from wearing her eyeglasses. She believes her headaches can also be attributed to this.   Dose of Decadron, if applicable: Reports she was taking steroids prior to surgery, but was not directed to resume after surgery  Recent neurologic symptoms, if any:  Seizures: Not since surgery. She continues to take Taking Keppra twice a day Headaches: Reports constant headaches prior to surgery. Now states they're  intermittent. States they alternate between aching and sharpe/stabbing; Currently taking topamax, fioricet, and tylenol help take edge off Nausea: Occurs when headache pain is intense  Dizziness/ataxia: Reports feeling unsteady on her feet (particularly related to feeling off balance on her left side) Difficulty with hand coordination: Denies--does have decreased dexterity to left hand Focal numbness/weakness: Left leg feels weaker/unsteady Visual deficits/changes: Yes--unable to wear glasses due to swelling so she reports her vision is blurry. Restricted left eye movements since surgery.  Confusion/Memory deficits: Reports she will be talking to someone and will forget part way through what she was talking about  Patient has a follow up appointment with Dr. Zada Finders on 03/21/23. PT referral pending.   PREVIOUS RADIATION THERAPY: No  PAST MEDICAL HISTORY:  has a past medical history of Anemia, Anxiety, Depression, GERD (gastroesophageal reflux disease), Headache, Obesity, Seizures ([place]), and Sleep apnea.    PAST SURGICAL HISTORY: Past Surgical History:  Procedure Laterality Date   ABDOMINAL HYSTERECTOMY     APPLICATION OF CRANIAL NAVIGATION N/A 02/08/2023   Procedure: APPLICATION OF CRANIAL NAVIGATION;  Surgeon: Judith Part, MD;  Location: [facility];  Service: Neurosurgery;  Laterality: N/A;   BREAST LUMPECTOMY Left 1997   CHOLECYSTECTOMY     CRANIOTOMY Left 02/08/2023   Procedure: Left craniotomy for tumor resection  with stealth navigation;  Surgeon: Judith Part, MD;  Location: [facility];  Service: Neurosurgery;  Laterality: Left;   GASTRIC BYPASS  03/2017   THROAT SURGERY     removed scar tissue from throat d/t being intubated.    TUBAL LIGATION      FAMILY HISTORY: family history includes Breast cancer in her paternal aunt and paternal grandmother; Breast cancer (age of onset: 58) in her mother.  SOCIAL HISTORY:  reports that she has never smoked. She has never used  smokeless tobacco. She reports that she does not drink alcohol and does not use drugs.  ALLERGIES: Dilaudid [hydromorphone hcl], Morphine and related, and Vicodin [hydrocodone-acetaminophen]  MEDICATIONS:  Current Outpatient Medications  Medication Sig Dispense Refill   butalbital-acetaminophen-caffeine (FIORICET) 50-325-40 MG tablet Take 2 tablets by mouth every 6 (six) hours as needed for headache (first line for headache).   Not to exceed 6 tablets per day 14 tablet 0   diclofenac Sodium (VOLTAREN) 1 % GEL Apply 2 g topically 2 (two) times daily. 200 g 0   DULoxetine (CYMBALTA) 30 MG capsule Take 2 capsules ('60mg'$ ) by mouth daily at 8:00am and 1 capsule ('30mg'$ ) nightly at 10:00pm 90 capsule 0   famotidine (PEPCID) 20 MG tablet Take 1 tablet (20 mg total) by mouth 2 (two) times daily. 60 tablet 0   levETIRAcetam (KEPPRA) 750 MG tablet Take 1 tablet (750 mg total) by mouth 2 (two) times daily. 60 tablet 0   methocarbamol (ROBAXIN) 500 MG tablet Take 1 tablet (500 mg total) by mouth every 6 (six) hours as needed for muscle spasms. 60 tablet 0   polyethylene glycol (MIRALAX / GLYCOLAX) 17 g packet Take 17 g by mouth 2 (two) times daily. 14 each 0   topiramate (TOPAMAX) 50 MG tablet Take 1 tablet (50 mg total) by mouth 2 (two) times daily. 60 tablet 0   Vitamin D, Ergocalciferol, (DRISDOL) 1.25 MG (50000 UNIT) CAPS capsule Take 1 capsule (50,000 Units total) by mouth once a week. 5 capsule 0   No current facility-administered medications for this encounter.    REVIEW OF SYSTEMS:  Notable for that above.   PHYSICAL EXAM:  height is '5\' 7"'$  (1.702 m) and weight is 283 lb 3.2 oz (128.5 kg). Her temperature is 98.1 F (36.7 C). Her blood pressure is 109/70 and her pulse is 93. Her respiration is 20 and oxygen saturation is 100%.   General: Alert and oriented, in no acute distress  HEENT: Soft, fluid-like swelling present in the left temple and left forehead, anterior to the left craniotomy scar. No  signs of infection. Patient blinks excessively with leftward gaze. Movements in her left eye become stuck when she tries to gaze superiorly. Lag in the left lateral gaze of the left eye.  Oropharynx is clear. Neck: Neck is supple, no obvious lymphadenopathy Heart: Regular in rate and rhythm with no murmurs, rubs, or gallops. Chest: Clear to auscultation bilaterally, with no rhonchi, wheezes, or rales. Extremities: No cyanosis or edema. Lymphatics: see Neck Exam Skin: No concerning lesions. Surgical incision site on scalp intact Musculoskeletal: Decreased strength in left lower extremity. Unsteady gait - left leg almost "gives out" Neurologic: Cranial nerves II through XII are grossly intact. Speech is fluent, but slowed. Rapidly alternating movements are slower on the left side. Finger to nose testing is slower on the left side.   Psychiatric: Judgment and insight are intact. Affect is appropriate.  KPS = 70  100 - Normal; no complaints; no  evidence of disease. 90   - Able to carry on normal activity; minor signs or symptoms of disease. 80   - Normal activity with effort; some signs or symptoms of disease. 57   - Cares for self; unable to carry on normal activity or to do active work. 60   - Requires occasional assistance, but is able to care for most of his personal needs. 50   - Requires considerable assistance and frequent medical care. 68   - Disabled; requires special care and assistance. 14   - Severely disabled; hospital admission is indicated although death not imminent. 32   - Very sick; hospital admission necessary; active supportive treatment necessary. 10   - Moribund; fatal processes progressing rapidly. 0     - Dead  Karnofsky DA, Abelmann WH, Craver LS and [facility] [phone]) The use of the nitrogen mustards in the palliative treatment of carcinoma: with particular reference to bronchogenic carcinoma Cancer 1 634-56  ECOG = 2  0 - Asymptomatic (Fully active, able to  carry on all predisease activities without restriction)  1 - Symptomatic but completely ambulatory (Restricted in physically strenuous activity but ambulatory and able to carry out work of a light or sedentary nature. For example, light housework, office work)  2 - Symptomatic, <50% in bed during the day (Ambulatory and capable of all self care but unable to carry out any work activities. Up and about more than 50% of waking hours)  3 - Symptomatic, >50% in bed, but not bedbound (Capable of only limited self-care, confined to bed or chair 50% or more of waking hours)  4 - Bedbound (Completely disabled. Cannot carry on any self-care. Totally confined to bed or chair)  5 - Death   Eustace Pen MM, Creech RH, Tormey DC, et al. [phone]). "Toxicity and response criteria of the [facility]". [place] Oncol. 5 (6): 649-55   LABORATORY DATA:  Lab Results  Component Value Date   WBC 4.5 02/26/2023   HGB 9.4 (L) 02/26/2023   HCT 28.7 (L) 02/26/2023   MCV 80.6 02/26/2023   PLT 321 02/26/2023   CMP     Component Value Date/Time   NA 132 (L) 02/26/2023 0609   K 4.0 02/26/2023 0609   CL 104 02/26/2023 0609   CO2 23 02/26/2023 0609   GLUCOSE 94 02/26/2023 0609   BUN 17 02/26/2023 0609   CREATININE 1.05 (H) 02/26/2023 0609   CALCIUM 8.6 (L) 02/26/2023 0609   PROT 6.3 (L) 02/16/2023 0801   ALBUMIN 2.5 (L) 02/16/2023 0801   AST 30 02/16/2023 0801   ALT 21 02/16/2023 0801   ALKPHOS 55 02/16/2023 0801   BILITOT 0.3 02/16/2023 0801   GFRNONAA >60 02/26/2023 0609   GFRAA >60 12/07/2019 2340         RADIOGRAPHY: MR BRAIN WO CONTRAST  Result Date: 02/22/2023 CLINICAL DATA:  Headache, neuro deficit. EXAM: MRI HEAD WITHOUT CONTRAST TECHNIQUE: Multiplanar, multiecho pulse sequences of the brain and surrounding structures were obtained without intravenous contrast. COMPARISON:  Head CT February 21, 2023; MRI of the brain February 08, 2019. FINDINGS: Brain: Postsurgical changes  from left pterional craniotomy with resection of inferior left anterior cranial fossa tumor. Area of encephalomalacia and gliosis in the inferior left frontal lobe with peripheral restricted diffusion consistent with expected marginal ischemia. A fluid collection within the inferior aspect of the left middle cranial fossa, likely subdural, at the site of tumor resection with associated susceptibility artifact related blood products/pneumocephalus measures  approximately 46 x 45 x 23 mm, mildly enlarged compared to prior MRI but stable since recent CT. An epidural fluid collection subjacent to the craniotomy now measures 9 mm, unchanged. This epidural collection communicates through the craniotomy to a larger left frontal scalp collection that measures 18 mm in thickness, compared to approximately 14 mm on prior CT. The epidural/subdural fluid collection resulting in mass effect on right frontal lobe with an approximately 4 mm rightward midline shift, stable. Enlarged, partially empty sella likely related to longstanding intracranial hypertension. Vascular: Mildly posteriorly displaced left MCA branches. Flow voids are maintained. Skull and upper cervical spine: Postsurgical changes from left pterional craniotomy. No focal marrow lesion identified. Sinuses/Orbits: Negative. Other: None. IMPRESSION: Stable postsurgical changes in the left frontal lobe with stable subdural/epidural fluid collection but mild increase of the left frontal scalp fluid collection, most consistent with a pseudomeningocele. Stable 4 mm rightward midline shift. Electronically Signed   By: Pedro Earls M.D.   On: 02/22/2023 12:57   CT HEAD WO CONTRAST (5MM)  Result Date: 02/21/2023 CLINICAL DATA:  Headache, increasing frequency or severity Recent meningioma resection, this AM with severe sharp bilateral Headache and difficulty with EOMI EXAM: CT HEAD WITHOUT CONTRAST TECHNIQUE: Contiguous axial images were obtained from the  base of the skull through the vertex without intravenous contrast. RADIATION DOSE REDUCTION: This exam was performed according to the departmental dose-optimization program which includes automated exposure control, adjustment of the mA and/or kV according to patient size and/or use of iterative reconstruction technique. COMPARISON:  MRI February 08, 2023. FINDINGS: Brain: Postoperative changes of meningioma resection in the inferior left frontal lobe. There is extensive edema in this region with some rightward midline shift (approximately 5 mm). Additionally there is an overlying extra-axial fluid collection which measures approximately 6 mm in thickness. No evidence of new/interval mass occupying acute hemorrhage or acute large vascular territory infarct. No hydrocephalus. Vascular: No hyperdense vessel identified. Skull: Left frontal craniotomy. Overlying scalp edema/fluid collection. Sinuses/Orbits: Clear sinuses.  No acute orbital findings. Other: No mastoid effusions. IMPRESSION: 1. Findings of recent meningioma resection with edema in the resection cavity, locules of gas, approximately 5 mm of midline shift, and approximately 6 mm thick overlying extra-axial fluid collection. Overall, findings do not appear substantially changed relative to recent MRI; however, direct comparison is limited by CT. An MRI with contrast could better assess for any new/interval complication if clinically warranted. 2. Increasing edema/fluid overlying the left frontal craniotomy. Findings are sterility indeterminate by imaging. Electronically Signed   By: Margaretha Sheffield M.D.   On: 02/21/2023 10:39   MR BRAIN W WO CONTRAST  Result Date: 02/09/2023 CLINICAL DATA:  CNS neoplasm, assess treatment response EXAM: MRI HEAD WITHOUT AND WITH CONTRAST TECHNIQUE: Multiplanar, multiecho pulse sequences of the brain and surrounding structures were obtained without and with intravenous contrast. CONTRAST:  41m GADAVIST GADOBUTROL 1  MMOL/ML IV SOLN COMPARISON:  01/17/2023 FINDINGS: Brain: Status post interval left frontal craniotomy and resection of a previously noted left anterior cranial fossa mass. Restricted diffusion is noted surrounding the resection cavity along the anterior left frontal lobe, not unexpected post surgically; no distal restricted diffusion to suggest additional acute infarct. Fluid, blood products, and air are noted in the resection cavity, with a 6 mm extra-axial fluid collection along the left frontal convexity, subjacent to the craniotomy flap (series 14, image 13). Decreased mass effect on the left frontal lobe, with improved size of the left lateral ventricle and 5 mm of residual  left to right midline shift, previously 9 mm when remeasured similarly. Persistent increased T2 signal in the left frontal lobe, likely edema. Partial empty sella, with expansion of the sella, similar to prior. Vascular: Normal arterial flow voids. Skull and upper cervical spine: Left frontotemporal craniotomy. Fluid and air in the subcutaneous tissues overlying the left aspect of the calvarium. Sinuses/Orbits: Mild mucosal thickening in the paranasal sinuses. No acute finding in the orbits. Other: The mastoids are well aerated. IMPRESSION: 1. Status post interval left frontal craniotomy and resection of a previously noted left anterior cranial fossa mass. Decreased mass effect on the left frontal lobe, with improved size of the left lateral ventricle and 5 mm of residual left to right midline shift, previously 9 mm when remeasured similarly. 2. 6 mm extra-axial fluid collection along the left frontal convexity, subjacent to the craniotomy flap, none expected postoperatively. Electronically Signed   By: Merilyn Baba M.D.   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 She is continuing to recover from her surgery.  We discussed the nature of grade 2 atypical meningiomas s/p resection and the role of radiation therapy. I recommend 6 weeks of daily (M-F) radiation treatment to the remaining disease, surgical bed, and surrounding margins to decrease her risk of disease recurrence. We discussed the benefits, risks, and side effects to radiotherapy. Side effects include, but are not limited to, headaches, eye tearing, fatigue, permanent hair loss, permanent skin changes, changes in cognitive function, cataracts, vision loss, and seizures were discussed with the patient. Patient expressed understanding and is ready to proceed with treatment. Consent form was signed today and placed in patient's chart. She is scheduled for CT simulation today. We will allow for a couple more weeks of healing from the surgery before beginning radiation therapy.   Patient will continue to see her neurologist, Dr. Marquette Saa, for additional medical and symptom management.   Patient is seeing OT for eye rehabilitation.  Referral for PT has been placed. Patient would like to receive PT outpatient for more opportunities to gain strength. I agree with this plan.   On date of service, in total, I spent 40 minutes on this encounter. Patient was seen in person.  __________________________________________   Leona Singleton, PA   Eppie Gibson, MD  This document serves as a record of services personally performed by Eppie Gibson, MD. It was created on her behalf by Roney Mans, a trained medical scribe. The creation of this record is based on the scribe's personal observations and the provider's statements to them. This document has been checked and approved by the attending provider.

## 2023-02-26 NOTE — Progress Notes (Signed)
PROGRESS NOTE   Subjective/Complaints:  DC home today. No questions or concerns this AM.   ROS: + HA, and L eye pain , Denies CP or SOB  Objective:   No results found. Recent Labs    02/26/23 0609  WBC 4.5  HGB 9.4*  HCT 28.7*  PLT 321    Recent Labs    02/26/23 0609  NA 132*  K 4.0  CL 104  CO2 23  GLUCOSE 94  BUN 17  CREATININE 1.05*  CALCIUM 8.6*     Intake/Output Summary (Last 24 hours) at 02/26/2023 0844 Last data filed at 02/25/2023 2200 Gross per 24 hour  Intake 360 ml  Output --  Net 360 ml         Physical Exam: Vital Signs Blood pressure 102/67, pulse 96, temperature 98.7 F (37.1 C), resp. rate 18, height '5\' 7"'$  (1.702 m), weight 130.7 kg, SpO2 96 %.  Physical Exam Constitutional: Appropriate appearance for age.  No acute distress.  +Obese. Sitting in bed HENT: No JVD. Neck Supple. Trachea midline. + Left craniotomy  Eyes:  Pt has persistent upward gaze on left that corrects to midline in ~2s  Cardiovascular: RRR, no murmurs/rub/gallops.  Peripheral pulses 2+  Respiratory: CTAB. No rales, rhonchi, or wheezing. On RA.  Abdomen: Soft, normoactive bowel sounds, normoactive. No distention or tenderness.  Skin: + L craniotomy site with frontal swelling , non erythematous, non indurated  No tension, warmth, erythema, or expressable discharge.    Neuro:  Eyes without evidence of nystagmus  Tone is normal without evidence of spasticity Cerebellar exam shows no evidence of ataxia on finger nose finger or heel to shin testing No evidence of trunkal ataxia  Motor strength is 5/5 in bilateral deltoid, biceps, triceps, finger flexors and extensors, wrist flexors and extensors, hip flexors, knee flexors and extensors, ankle dorsiflexors, plantar flexors, invertors and evertors, toe flexors and extensors  Sensory exam is normal to  light touch in the upper and lower limbs   Cranial nerves II-  Visual fields are intact to confrontation testing, no blurring of vision III- no evidence of ptosis,  reduced L>R upward gaze, downward and medial gaze intact IV- no vertical diplopia or head tilt V- Left V1 hypersensitivity VI- no pupil abduction weakness VII- no facial droop, good lid closure VII- normal auditory acuity IX- no pharygeal weakness, gag nl X- no pharyngeal weakness, no hoarseness XI- no trap or SCM weakness XII- no glossal weakness       Assessment/Plan: 1. Functional deficits which require 3+ hours per day of interdisciplinary therapy in a comprehensive inpatient rehab setting. Physiatrist is providing close team supervision and 24 hour management of active medical problems listed below. Physiatrist and rehab team continue to assess barriers to discharge/monitor patient progress toward functional and medical goals  Care Tool:  Bathing    Body parts bathed by patient: Chest, Left arm, Right arm, Abdomen, Front perineal area, Buttocks, Right upper leg, Left upper leg, Right lower leg, Left lower leg, Face         Bathing assist Assist Level: Independent     Upper Body Dressing/Undressing Upper body dressing   What is the  patient wearing?: Pull over shirt, Bra    Upper body assist Assist Level: Independent    Lower Body Dressing/Undressing Lower body dressing      What is the patient wearing?: Pants, Underwear/pull up     Lower body assist Assist for lower body dressing: Independent     Toileting Toileting    Toileting assist Assist for toileting: Independent     Transfers Chair/bed transfer  Transfers assist     Chair/bed transfer assist level: Independent     Locomotion Ambulation   Ambulation assist      Assist level: Independent with assistive device Assistive device: No Device Max distance: 200+   Walk 10 feet activity   Assist     Assist level: Independent with assistive device Assistive device: No Device   Walk  50 feet activity   Assist    Assist level: Independent with assistive device Assistive device: No Device    Walk 150 feet activity   Assist    Assist level: Independent with assistive device Assistive device: No Device    Walk 10 feet on uneven surface  activity   Assist Walk 10 feet on uneven surfaces activity did not occur: Safety/medical concerns   Assist level: Independent with assistive device Assistive device: Other (comment) (No AD)   Wheelchair     Assist Is the patient using a wheelchair?: No             Wheelchair 50 feet with 2 turns activity    Assist            Wheelchair 150 feet activity     Assist          Blood pressure 102/67, pulse 96, temperature 98.7 F (37.1 C), resp. rate 18, height '5\' 7"'$  (1.702 m), weight 130.7 kg, SpO2 96 %.  Medical Problem List and Plan: 1. Functional deficits secondary to meningioma s/p craniotomy             -patient may shower             -ELOS/Goals: 7-10 days, supervision PT/OT  - Stage 2 on pathology; XRT services  starting next Tuesday as OP   - 2/28: Stat CT head for severe worsening headache did not show any significant changes from prior MRI, however difficult to compare.  Patient cleared to resume therapies   - 2/29: New LUE and LLE deficits on PT exam this AM; therapy holds, stat MRI head unchanged, Dr. Zada Finders evalauted, no concerning new neuro deficits; continue current plan of care  Motor exam at baseline this am 3/2  -DC home today   2.  Antithrombotics: -DVT/anticoagulation:  Pharmaceutical: Heparin             -antiplatelet therapy: none   3. Pain Management:  Ongoing post-op headache - waxing/waning, overall improving                        -   -2-24: Symptoms much improved, continue current regimen.  Gabapentin discontinued due to history of lethargy on this medication.   - 2.25-26: stable, monitor   - 2/27: Voltaren BID to neck for cervicalgia   - 2/28: One-time  dose of gabapentin 300 mg for severe worsening of headache, now replaced with Topamax 25 mg twice daily as needed   - 2/29: neurosurgery eval as above. Did discuss how return of sensation in L V1 likely d/t decreased swelling, hyperalgesia likely contributing to HA pain.    -  3/1: Increase Topomax to 2 tabs (50 mg) BID. If not tolerating, can decreased to 1 tab BID. Conitnue PRN tramadol and fioricet increase to 2 tabs . Avoid gabapentin and oxycodone d/t lethargy at low doses.    4. Mood/Behavior/Sleep: LCSW to evaluate and provide emotional support             -continue Cymbalta 60 mg daily (depressive disorder) -> increase to 60 mg QAM/30 mg QHS 3/1 for adjustment disorder/worsening depression with new disabilities             -antipsychotic agents: n/a   5. Neuropsych/cognition: This patient is capable of making decisions on his own behalf. - 2/28: Reported  housing difficulties and may need to go to shelter on discharge; SW following for options, anxiety may contribute to symptoms - 3/1: Neuropsych eval performed, appreciate recommendations   6. Skin/Wound Care: Routine skin care checks   7. Fluids/Electrolytes/Nutrition: Routine Is and Os and follow-up chemistries -Mild AKI on admission labs with creatinine 1.1, BUN 7, along with hyponatremia 134 from 140.  Initiate IV fluids normal saline at 150 cc/h, 1 L total.  Will repeat labs in a.m. 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 -Hemoglobin stable 2-23, 24, 26, 3/4    11. Constipation. LBM PTA.              - Colace 100 mg BID + PRN Miralax inpatient; increase to BID miralax, Sennakot-S 2 tabs QHS             - PRN sorbitol, MOM, and Enema;  encouraged patient to use a PRN tonight             - LBM 3/2  13.  Orthostatic hypotension, with presyncope -resolved- off IVF  LOS: 11 days A FACE TO FACE EVALUATION WAS PERFORMED  Jennye Boroughs 02/26/2023, 8:44 AM

## 2023-02-26 NOTE — Progress Notes (Signed)
Inpatient Rehabilitation Care Coordinator Discharge Note   Patient Details  Name: Karlynn Shugars MRN: [id number] Date of Birth: 05-Jun-1971   Discharge location: D/c to home  Length of Stay: 10 days  Discharge activity level: 10 days  Home/community participation: Mod I  Patient response [id number] Literacy - How often do you need to have someone help you when you read instructions, pamphlets, or other written material from your doctor or pharmacy?: Never  Patient response [id number] Isolation - How often do you feel lonely or isolated from those around you?: Rarely  Services provided included: MD, RD, PT, OT, SLP, RN, CM, Pharmacy, Neuropsych, SW, TR  Financial Services:  Charity fundraiser Utilized: [place] offered to/list presented to: patient  Follow-up services arranged:  Outpatient    Outpatient Servicies: [facility] for outpatient PT/OT      Patient response to transportation need: Is the patient able to respond to transportation needs?: Yes In the past 12 months, has lack of transportation kept you from medical appointments or from getting medications?: No In the past 12 months, has lack of transportation kept you from meetings, work, or from getting things needed for daily living?: No   Comments (or additional information):  Patient/Family verbalized understanding of follow-up arrangements:  Yes  Individual responsible for coordination of the follow-up plan: contact pt [phone] and pt son Sheldon#[phone]  Confirmed correct DME delivered: Rana Snare 02/26/2023    Rana Snare

## 2023-02-26 NOTE — Progress Notes (Signed)
Nursing care plan and nursing education closed out for discharge to home.

## 2023-02-27 ENCOUNTER — Other Ambulatory Visit (HOSPITAL_COMMUNITY): Payer: Self-pay

## 2023-02-27 ENCOUNTER — Other Ambulatory Visit: Payer: Self-pay

## 2023-02-27 ENCOUNTER — Telehealth: Payer: Self-pay

## 2023-02-27 ENCOUNTER — Ambulatory Visit
Admission: RE | Admit: 2023-02-27 | Discharge: 2023-02-27 | Disposition: A | Discharge status: Home / Self Care | Payer: Medicaid Other | Source: Ambulatory Visit | Attending: Radiation Oncology | Admitting: Radiation Oncology

## 2023-02-27 ENCOUNTER — Ambulatory Visit
Admission: RE | Admit: 2023-02-27 | Discharge: 2023-02-27 | Discharge status: Home / Self Care | Payer: Medicaid Other | Source: Ambulatory Visit | Attending: Radiation Oncology

## 2023-02-27 ENCOUNTER — Encounter: Payer: Self-pay | Admitting: Radiation Oncology

## 2023-02-27 VITALS — BP 109/70 | HR 93 | Temp 98.1°F | Resp 20 | Ht 67.0 in | Wt 283.2 lb

## 2023-02-27 DIAGNOSIS — Z791 Long term (current) use of non-steroidal anti-inflammatories (NSAID): Secondary | ICD-10-CM | POA: Insufficient documentation

## 2023-02-27 DIAGNOSIS — E669 Obesity, unspecified: Secondary | ICD-10-CM | POA: Insufficient documentation

## 2023-02-27 DIAGNOSIS — C700 Malignant neoplasm of cerebral meninges: Secondary | ICD-10-CM | POA: Diagnosis not present

## 2023-02-27 DIAGNOSIS — R0602 Shortness of breath: Secondary | ICD-10-CM | POA: Diagnosis not present

## 2023-02-27 DIAGNOSIS — D420 Neoplasm of uncertain behavior of cerebral meninges: Secondary | ICD-10-CM | POA: Diagnosis not present

## 2023-02-27 DIAGNOSIS — R200 Anesthesia of skin: Secondary | ICD-10-CM | POA: Diagnosis not present

## 2023-02-27 DIAGNOSIS — K219 Gastro-esophageal reflux disease without esophagitis: Secondary | ICD-10-CM | POA: Diagnosis not present

## 2023-02-27 DIAGNOSIS — D320 Benign neoplasm of cerebral meninges: Secondary | ICD-10-CM

## 2023-02-27 DIAGNOSIS — R110 Nausea: Secondary | ICD-10-CM | POA: Insufficient documentation

## 2023-02-27 MED ORDER — ACETAMINOPHEN 325 MG PO TABS
650.0000 mg | ORAL_TABLET | Freq: Once | ORAL | Status: AC
  Administered 2023-02-27: 650 mg via ORAL
  Filled 2023-02-27: qty 2

## 2023-02-27 NOTE — Addendum Note (Signed)
Encounter addended by: Eppie Gibson, MD on: 02/27/2023 5:31 PM  Actions taken: Clinical Note Signed

## 2023-02-27 NOTE — Telephone Encounter (Signed)
Transition Care Management Unsuccessful Follow-up Telephone Call  Date of discharge and from where:  02/26/2023  Attempts:  1st Attempt  Reason for unsuccessful TCM follow-up call:  Left voice message

## 2023-02-28 ENCOUNTER — Telehealth: Payer: Self-pay | Admitting: *Deleted

## 2023-02-28 NOTE — Telephone Encounter (Signed)
Transitional Care call--patient    Are you/is patient experiencing any problems since coming home? No Are there any questions regarding any aspect of care? No Are there any questions regarding medications administration/dosing? No Are meds being taken as prescribed? Yes Patient should review meds with caller to confirm Have there been any falls? No Has Home Health been to the house and/or have they contacted you? [facility], already set up If not, have you tried to contact them? Can we help you contact them? Are bowels and bladder emptying properly? Yes Are there any unexpected incontinence issues? No If applicable, is patient following bowel/bladder programs? Any fevers, problems with breathing, unexpected pain? No Are there any skin problems or new areas of breakdown? No Has the patient/family member arranged specialty MD follow up (ie cardiology/neurology/renal/surgical/etc)? Yes Can we help arrange? Does the patient need any other services or support that we can help arrange? No Are caregivers following through as expected in assisting the patient? Yes Has the patient quit smoking, drinking alcohol, or using drugs as recommended? Yes  Appointment time 1:20 pm, arrive time 1:00 pm with [place]

## 2023-02-28 NOTE — Telephone Encounter (Signed)
Received signed [facility] Patient Request for Access from XRT.  Radiation Teaching laboratory technician denies receipt or submitting request to form bin.  Connected with Amaliah Steinbeck, [phone] (home) to inquire of needs and advised of [place] ([place]) H.I.M. Department. "Applied for [employer].  [facility] is not aware I have started radiation.  Signed to ensure you all send [facility] my records." Returned request to radiation to process.  No actions performed or required by this nurse.

## 2023-03-01 ENCOUNTER — Other Ambulatory Visit (HOSPITAL_COMMUNITY): Payer: Self-pay

## 2023-03-02 ENCOUNTER — Other Ambulatory Visit: Payer: Self-pay | Admitting: Radiation Therapy

## 2023-03-02 ENCOUNTER — Encounter: Payer: Self-pay | Admitting: Radiation Therapy

## 2023-03-02 DIAGNOSIS — D329 Benign neoplasm of meninges, unspecified: Secondary | ICD-10-CM

## 2023-03-02 NOTE — Progress Notes (Signed)
Pre Dr. 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   The referral was sent to Dr. Delrae Rend with [facility] @ [place].   I left their new patient coordinator a voicemail and faxed the referral information to [phone].   In addition to this referral, Dr. Isidore Moos asked that Evelyn [name] have labs drawn prior to the start of her radiation to check her TSH level. An order has been entered and a lab appointment scheduled on 03/14/23 prior to her first treatment visit.   I left a voicemail for the patient requesting a call back to discuss this with her.   Mont Dutton R.T.(R)(T) Radiation Special Procedures Navigator

## 2023-03-05 ENCOUNTER — Telehealth: Payer: Self-pay | Admitting: Radiation Therapy

## 2023-03-05 ENCOUNTER — Telehealth: Payer: Self-pay | Admitting: *Deleted

## 2023-03-05 DIAGNOSIS — D320 Benign neoplasm of cerebral meninges: Secondary | ICD-10-CM | POA: Diagnosis not present

## 2023-03-05 DIAGNOSIS — D420 Neoplasm of uncertain behavior of cerebral meninges: Secondary | ICD-10-CM | POA: Diagnosis not present

## 2023-03-05 MED ORDER — BUTALBITAL-APAP-CAFFEINE 50-325-40 MG PO TABS: 2.0000 | ORAL_TABLET | Freq: Four times a day (QID) | ORAL | 0 refills | Status: AC | PRN

## 2023-03-05 NOTE — Telephone Encounter (Signed)
Patient scheduled for TC on Wednesday 3/13 with you. She is out off [place] and was told by Marlowe Shores to call office for refills. Patient requesting refill b/c she is out of meds @ [facility].

## 2023-03-05 NOTE — Telephone Encounter (Signed)
Discharge summary was reviewed.  Fiorcet e-scribed to pharmacy,  Call placed to Ms. Wallis, no answer, left message to return the call.  Discussed the above with Dr Tressa Busman, she agrees with the refill.

## 2023-03-05 NOTE — Telephone Encounter (Signed)
I spoke with the patient's mother about Evelyn [name]'s upcoming lab appointment prior to her radiation and the endocrinology referral entered for a November 2024 visit with Dr. Delrae Rend.  Mont Dutton R.T.(R)(T) Radiation Special Procedures Navigator

## 2023-03-06 DIAGNOSIS — D320 Benign neoplasm of cerebral meninges: Secondary | ICD-10-CM | POA: Diagnosis not present

## 2023-03-07 ENCOUNTER — Encounter: Payer: Medicaid Other | Attending: Registered Nurse | Admitting: Registered Nurse

## 2023-03-07 VITALS — BP 110/78 | HR 80 | Ht 67.0 in | Wt 278.0 lb

## 2023-03-07 DIAGNOSIS — D320 Benign neoplasm of cerebral meninges: Secondary | ICD-10-CM | POA: Diagnosis not present

## 2023-03-07 MED ORDER — TOPIRAMATE 50 MG PO TABS: 50.0000 mg | ORAL_TABLET | Freq: Two times a day (BID) | ORAL | 1 refills | Status: DC

## 2023-03-07 MED ORDER — METHOCARBAMOL 500 MG PO TABS: 500.0000 mg | ORAL_TABLET | Freq: Four times a day (QID) | ORAL | 1 refills | Status: AC | PRN

## 2023-03-07 NOTE — Patient Instructions (Signed)
Make an appointment with Primary Care for hospital follow up appointment.

## 2023-03-07 NOTE — Progress Notes (Unsigned)
Subjective:    Patient ID: Evelyn [name], female    DOB: 05-31-1971, 52 y.o.   MRN: [id number]  HPI: Evelyn [name] is a 52 y.o. female who returns for follow up appointment for chronic pain and medication refill. states *** pain is located in  ***. rates pain ***. current exercise regime is walking and performing stretching exercises.       Pain Inventory Average Pain 3 Pain Right Now 6 My pain is constant, sharp, stabbing, and aching  LOCATION OF PAIN  head neck  BOWEL Number of stools per week: 7 Oral laxative use No  Type of laxative n/a Enema or suppository use No  History of colostomy No  Incontinent No   BLADDER Normal In and out cath, frequency n/a Able to self cath  n/a Bladder incontinence No  Frequent urination No  Leakage with coughing No  Difficulty starting stream No  Incomplete bladder emptying No    Mobility walk without assistance ability to climb steps?  no do you drive?  no Do you have any goals in this area?  yes  Function not employed: date last employed 12/14/22 I need assistance with the following:  meal prep, household duties, and shopping  Neuro/Psych tremor spasms dizziness confusion loss of taste or smell  Prior Studies CT/MRI  Physicians involved in your care New patient   Family History  Problem Relation Age of Onset   Breast cancer Mother 71   Breast cancer Paternal Aunt    Breast cancer Paternal Grandmother    Social History   Socioeconomic History   Marital status: Single    Spouse name: Not on file   Number of children: Not on file   Years of education: Not on file   Highest education level: Not on file  Occupational History   Not on file  Tobacco Use   Smoking status: Never   Smokeless tobacco: Never  Vaping Use   Vaping Use: Never used  Substance and Sexual Activity   Alcohol use: No   Drug use: No   Sexual activity: Yes    Birth control/protection: Surgical  Other Topics Concern    Not on file  Social History Narrative   Not on file   Social Determinants of Health   Financial Resource Strain: Not on file  Food Insecurity: Not on file  Transportation Needs: Not on file  Physical Activity: Not on file  Stress: Not on file  Social Connections: Not on file   Past Surgical History:  Procedure Laterality Date   ABDOMINAL HYSTERECTOMY     APPLICATION OF CRANIAL NAVIGATION N/A 02/08/2023   Procedure: APPLICATION OF CRANIAL NAVIGATION;  Surgeon: Judith Part, MD;  Location: [facility];  Service: Neurosurgery;  Laterality: N/A;   BREAST LUMPECTOMY Left 1997   CHOLECYSTECTOMY     CRANIOTOMY Left 02/08/2023   Procedure: Left craniotomy for tumor resection with stealth navigation;  Surgeon: Judith Part, MD;  Location: [facility];  Service: Neurosurgery;  Laterality: Left;   GASTRIC BYPASS  03/2017   THROAT SURGERY     removed scar tissue from throat d/t being intubated.    TUBAL LIGATION     Past Medical History:  Diagnosis Date   Anemia    Anxiety    Depression    GERD (gastroesophageal reflux disease)    Headache    Obesity    Seizures ([employer])    Sleep apnea    There were no vitals taken for this visit.  Opioid Risk Score:   Fall Risk Score:  `1  Depression screen [facility] 2/9     02/14/2017    3:41 PM  Depression screen PHQ 2/9  Decreased Interest 1  Down, Depressed, Hopeless 0  PHQ - 2 Score 1  Altered sleeping 3  Tired, decreased energy 1  Change in appetite 0  Feeling bad or failure about yourself  0  Trouble concentrating 0  Moving slowly or fidgety/restless 0  Suicidal thoughts 0  PHQ-9 Score 5     Review of Systems  Constitutional:  Positive for appetite change and chills.  Gastrointestinal:  Positive for nausea.  Musculoskeletal:        Head  All other systems reviewed and are negative.      Objective:   Physical Exam        Assessment & Plan:

## 2023-03-08 ENCOUNTER — Encounter: Payer: Self-pay | Admitting: Registered Nurse

## 2023-03-09 ENCOUNTER — Emergency Department (HOSPITAL_COMMUNITY): Payer: Medicaid Other

## 2023-03-09 ENCOUNTER — Inpatient Hospital Stay (HOSPITAL_COMMUNITY)
Admission: EM | Admit: 2023-03-09 | Discharge: 2023-03-18 | DRG: 176 | Disposition: A | Discharge status: Home / Self Care | Payer: Medicaid Other | Attending: Internal Medicine | Admitting: Internal Medicine

## 2023-03-09 ENCOUNTER — Observation Stay (HOSPITAL_COMMUNITY): Payer: Medicaid Other

## 2023-03-09 ENCOUNTER — Other Ambulatory Visit (HOSPITAL_COMMUNITY): Payer: Medicaid Other

## 2023-03-09 ENCOUNTER — Encounter (HOSPITAL_COMMUNITY): Payer: Self-pay

## 2023-03-09 DIAGNOSIS — Z9049 Acquired absence of other specified parts of digestive tract: Secondary | ICD-10-CM

## 2023-03-09 DIAGNOSIS — E871 Hypo-osmolality and hyponatremia: Secondary | ICD-10-CM | POA: Diagnosis not present

## 2023-03-09 DIAGNOSIS — Z803 Family history of malignant neoplasm of breast: Secondary | ICD-10-CM

## 2023-03-09 DIAGNOSIS — R0789 Other chest pain: Secondary | ICD-10-CM | POA: Diagnosis present

## 2023-03-09 DIAGNOSIS — H5462 Unqualified visual loss, left eye, normal vision right eye: Secondary | ICD-10-CM | POA: Diagnosis present

## 2023-03-09 DIAGNOSIS — I2699 Other pulmonary embolism without acute cor pulmonale: Secondary | ICD-10-CM | POA: Diagnosis not present

## 2023-03-09 DIAGNOSIS — Z90710 Acquired absence of both cervix and uterus: Secondary | ICD-10-CM

## 2023-03-09 DIAGNOSIS — R109 Unspecified abdominal pain: Secondary | ICD-10-CM | POA: Diagnosis present

## 2023-03-09 DIAGNOSIS — R000 Tachycardia, unspecified: Secondary | ICD-10-CM | POA: Diagnosis not present

## 2023-03-09 DIAGNOSIS — R0781 Pleurodynia: Secondary | ICD-10-CM | POA: Diagnosis not present

## 2023-03-09 DIAGNOSIS — D649 Anemia, unspecified: Secondary | ICD-10-CM | POA: Diagnosis present

## 2023-03-09 DIAGNOSIS — R519 Headache, unspecified: Secondary | ICD-10-CM | POA: Diagnosis not present

## 2023-03-09 DIAGNOSIS — Z6841 Body Mass Index (BMI) 40.0 and over, adult: Secondary | ICD-10-CM

## 2023-03-09 DIAGNOSIS — Z885 Allergy status to narcotic agent status: Secondary | ICD-10-CM

## 2023-03-09 DIAGNOSIS — T888XXA Other specified complications of surgical and medical care, not elsewhere classified, initial encounter: Secondary | ICD-10-CM

## 2023-03-09 DIAGNOSIS — G40909 Epilepsy, unspecified, not intractable, without status epilepticus: Secondary | ICD-10-CM | POA: Diagnosis present

## 2023-03-09 DIAGNOSIS — G4733 Obstructive sleep apnea (adult) (pediatric): Secondary | ICD-10-CM | POA: Diagnosis present

## 2023-03-09 DIAGNOSIS — R079 Chest pain, unspecified: Secondary | ICD-10-CM | POA: Diagnosis not present

## 2023-03-09 DIAGNOSIS — F4323 Adjustment disorder with mixed anxiety and depressed mood: Secondary | ICD-10-CM | POA: Diagnosis present

## 2023-03-09 DIAGNOSIS — D420 Neoplasm of uncertain behavior of cerebral meninges: Secondary | ICD-10-CM | POA: Diagnosis present

## 2023-03-09 DIAGNOSIS — Z79899 Other long term (current) drug therapy: Secondary | ICD-10-CM

## 2023-03-09 DIAGNOSIS — R509 Fever, unspecified: Secondary | ICD-10-CM | POA: Diagnosis not present

## 2023-03-09 DIAGNOSIS — R059 Cough, unspecified: Secondary | ICD-10-CM | POA: Diagnosis not present

## 2023-03-09 DIAGNOSIS — Z91199 Patient's noncompliance with other medical treatment and regimen due to unspecified reason: Secondary | ICD-10-CM

## 2023-03-09 DIAGNOSIS — D509 Iron deficiency anemia, unspecified: Secondary | ICD-10-CM | POA: Diagnosis present

## 2023-03-09 DIAGNOSIS — Z9884 Bariatric surgery status: Secondary | ICD-10-CM

## 2023-03-09 DIAGNOSIS — Z1152 Encounter for screening for COVID-19: Secondary | ICD-10-CM

## 2023-03-09 DIAGNOSIS — G96198 Other disorders of meninges, not elsewhere classified: Secondary | ICD-10-CM | POA: Diagnosis present

## 2023-03-09 DIAGNOSIS — Z86011 Personal history of benign neoplasm of the brain: Secondary | ICD-10-CM

## 2023-03-09 DIAGNOSIS — S0632AA Contusion and laceration of left cerebrum with loss of consciousness status unknown, initial encounter: Secondary | ICD-10-CM

## 2023-03-09 DIAGNOSIS — K219 Gastro-esophageal reflux disease without esophagitis: Secondary | ICD-10-CM | POA: Diagnosis present

## 2023-03-09 LAB — COMPREHENSIVE METABOLIC PANEL
ALT: 12 U/L (ref 0–44)
AST: 15 U/L (ref 15–41)
Albumin: 3.3 g/dL — ABNORMAL LOW (ref 3.5–5.0)
Alkaline Phosphatase: 66 U/L (ref 38–126)
Anion gap: 8 (ref 5–15)
BUN: 5 mg/dL — ABNORMAL LOW (ref 6–20)
CO2: 23 mmol/L (ref 22–32)
Calcium: 8.9 mg/dL (ref 8.9–10.3)
Chloride: 104 mmol/L (ref 98–111)
Creatinine, Ser: 0.86 mg/dL (ref 0.44–1.00)
GFR, Estimated: >60 mL/min (ref >60)
Glucose, Bld: 127 mg/dL — ABNORMAL HIGH (ref 70–99)
Potassium: 3.5 mmol/L (ref 3.5–5.1)
Sodium: 135 mmol/L (ref 135–145)
Total Bilirubin: 0.5 mg/dL (ref 0.3–1.2)
Total Protein: 7.1 g/dL (ref 6.5–8.1)

## 2023-03-09 LAB — CBC
HCT: 30.2 % — ABNORMAL LOW (ref 36.0–46.0)
Hemoglobin: 9.4 g/dL — ABNORMAL LOW (ref 12.0–15.0)
MCH: 26 pg (ref 26.0–34.0)
MCHC: 31.1 g/dL (ref 30.0–36.0)
MCV: 83.4 fL (ref 80.0–100.0)
Platelets: 244 10*3/uL (ref 150–400)
RBC: 3.62 MIL/uL — ABNORMAL LOW (ref 3.87–5.11)
RDW: 14.6 % (ref 11.5–15.5)
WBC: 8.1 10*3/uL (ref 4.0–10.5)
nRBC: 0 % (ref 0.0–0.2)

## 2023-03-09 LAB — D-DIMER, QUANTITATIVE: D-Dimer, Quant: 9.02 ug/mL-FEU — ABNORMAL HIGH (ref 0.00–0.50)

## 2023-03-09 LAB — BRAIN NATRIURETIC PEPTIDE: B Natriuretic Peptide: 10.5 pg/mL (ref 0.0–100.0)

## 2023-03-09 LAB — LIPASE, BLOOD: Lipase: 35 U/L (ref 11–51)

## 2023-03-09 MED ORDER — SODIUM CHLORIDE 0.9% FLUSH
3.0000 mL | Freq: Two times a day (BID) | INTRAVENOUS | Status: DC
  Administered 2023-03-09 – 2023-03-18 (×16): 3 mL via INTRAVENOUS

## 2023-03-09 MED ORDER — ONDANSETRON HCL 4 MG PO TABS: 4.0000 mg | ORAL_TABLET | Freq: Four times a day (QID) | ORAL | Status: DC | PRN

## 2023-03-09 MED ORDER — DULOXETINE HCL 60 MG PO CPEP
60.0000 mg | ORAL_CAPSULE | Freq: Every day | ORAL | Status: DC
  Administered 2023-03-12: 60 mg via ORAL
  Filled 2023-03-09 (×6): qty 1

## 2023-03-09 MED ORDER — FENTANYL CITRATE PF 50 MCG/ML IJ SOSY
50.0000 ug | PREFILLED_SYRINGE | INTRAMUSCULAR | Status: DC | PRN
  Administered 2023-03-09 – 2023-03-10 (×2): 50 ug via INTRAVENOUS
  Filled 2023-03-09 (×2): qty 1

## 2023-03-09 MED ORDER — IOHEXOL 350 MG/ML SOLN
75.0000 mL | Freq: Once | INTRAVENOUS | Status: AC | PRN
  Administered 2023-03-09: 75 mL via INTRAVENOUS

## 2023-03-09 MED ORDER — FAMOTIDINE 20 MG PO TABS
20.0000 mg | ORAL_TABLET | Freq: Two times a day (BID) | ORAL | Status: DC
  Administered 2023-03-09 – 2023-03-18 (×18): 20 mg via ORAL
  Filled 2023-03-09 (×18): qty 1

## 2023-03-09 MED ORDER — RIVAROXABAN 20 MG PO TABS: 20.0000 mg | ORAL_TABLET | Freq: Every day | ORAL | Status: DC

## 2023-03-09 MED ORDER — ACETAMINOPHEN 500 MG PO TABS
1000.0000 mg | ORAL_TABLET | Freq: Four times a day (QID) | ORAL | Status: DC | PRN
  Administered 2023-03-09 – 2023-03-18 (×12): 1000 mg via ORAL
  Filled 2023-03-09 (×12): qty 2

## 2023-03-09 MED ORDER — RIVAROXABAN 15 MG PO TABS
15.0000 mg | ORAL_TABLET | Freq: Two times a day (BID) | ORAL | Status: DC
  Administered 2023-03-10 – 2023-03-18 (×17): 15 mg via ORAL
  Filled 2023-03-09 (×17): qty 1

## 2023-03-09 MED ORDER — APIXABAN 5 MG PO TABS
10.0000 mg | ORAL_TABLET | Freq: Two times a day (BID) | ORAL | Status: DC
  Administered 2023-03-09: 10 mg via ORAL
  Filled 2023-03-09: qty 2

## 2023-03-09 MED ORDER — APIXABAN 5 MG PO TABS: 5.0000 mg | ORAL_TABLET | Freq: Two times a day (BID) | ORAL | Status: DC

## 2023-03-09 MED ORDER — TOPIRAMATE 25 MG PO TABS
50.0000 mg | ORAL_TABLET | Freq: Two times a day (BID) | ORAL | Status: DC
  Administered 2023-03-09 – 2023-03-18 (×18): 50 mg via ORAL
  Filled 2023-03-09 (×20): qty 2

## 2023-03-09 MED ORDER — ONDANSETRON HCL 4 MG/2ML IJ SOLN
4.0000 mg | Freq: Four times a day (QID) | INTRAMUSCULAR | Status: DC | PRN
  Administered 2023-03-10: 4 mg via INTRAVENOUS
  Filled 2023-03-09: qty 2

## 2023-03-09 MED ORDER — DULOXETINE HCL 30 MG PO CPEP
30.0000 mg | ORAL_CAPSULE | Freq: Every day | ORAL | Status: DC
  Filled 2023-03-09 (×7): qty 1

## 2023-03-09 MED ORDER — LEVETIRACETAM 750 MG PO TABS
750.0000 mg | ORAL_TABLET | Freq: Two times a day (BID) | ORAL | Status: DC
  Administered 2023-03-09 – 2023-03-18 (×18): 750 mg via ORAL
  Filled 2023-03-09 (×18): qty 1

## 2023-03-09 MED ORDER — SENNOSIDES-DOCUSATE SODIUM 8.6-50 MG PO TABS: 1.0000 | ORAL_TABLET | Freq: Every evening | ORAL | Status: DC | PRN

## 2023-03-09 MED ORDER — FENTANYL CITRATE PF 50 MCG/ML IJ SOSY
50.0000 ug | PREFILLED_SYRINGE | Freq: Once | INTRAMUSCULAR | Status: AC
  Administered 2023-03-09: 50 ug via INTRAVENOUS
  Filled 2023-03-09: qty 1

## 2023-03-09 MED ORDER — METHOCARBAMOL 500 MG PO TABS: 500.0000 mg | ORAL_TABLET | Freq: Four times a day (QID) | ORAL | Status: DC | PRN

## 2023-03-09 NOTE — ED Triage Notes (Addendum)
Pt BIBA from home. Pt c/o gradual onset of right sided rib/RUQ pain since yesterday, worsening today. Pt c/o nausea, denies emesis, diarrhea.Pt endorses cough and SHOB with increased pain. Pain is worse when laying flat.  110HR 130/70 97% RA   18 L AC

## 2023-03-09 NOTE — Assessment & Plan Note (Signed)
Continue Cymbalta.

## 2023-03-09 NOTE — Assessment & Plan Note (Addendum)
This is her main complaint and likely contributing to her tachypnea and tachycardia.  Pain is primarily on the right side, she is very uncomfortable while resting in bed.  Reports good bowel movements and urine output.  She has had previous cholecystectomy and Roux-en-Y gastric bypass.  LFTs and lipase are normal. -Obtain CT abdomen/pelvis -Tylenol and fentanyl for pain prn (patient reports adverse reaction to morphine and Dilaudid) -Antiemetics as needed, advance diet as tolerated if CT reassuring

## 2023-03-09 NOTE — Assessment & Plan Note (Signed)
Hemoglobin stable.  Remote labs show history of iron deficiency.  She denies any obvious bleeding.

## 2023-03-09 NOTE — Hospital Course (Signed)
Evelyn [name] is a 52 y.o. 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

## 2023-03-09 NOTE — Assessment & Plan Note (Addendum)
Segmental PE within the left inferior lobe seen on CT chest.  There is no CT evidence of right heart strain.  She was given a dose of Eliquis in the ED but after EDP discussed with on-call neurosurgery they recommended Xarelto for anticoagulation.  Due to tachypnea and dyspnea overnight observation was requested by the EDP. -Continue Xarelto -Supplemental O2 as needed

## 2023-03-09 NOTE — Assessment & Plan Note (Signed)
Has not been using CPAP for years due to issues with her machine.

## 2023-03-09 NOTE — Assessment & Plan Note (Signed)
S/p left craniotomy and resection by Dr. Venetia Constable 02/08/2023.  Has chronic swelling to her left forehead which patient states is stable.  She is planned to start radiation treatment per Rad/Onc Dr. Isidore Moos. .  She has a previous history of seizure activity and chronic headaches related to this issue.   -Continue Keppra, Topamax

## 2023-03-09 NOTE — ED Provider Notes (Signed)
[facility] Provider Note   CSN: [id number] Arrival date & time: 03/09/23  1456     History  Chief Complaint  Patient presents with   Abdominal Pain    Evelyn [name] is a 52 y.o. female.  HPI Patient with multiple medical problems including adjustment disorder, meningioma, recent admission now presents with right upper quadrant, right lower chest pain.  There is associated dyspnea.  Onset was yesterday after approximately 10 days of feeling well following her hospitalization. No fever, other chest pain, other abdominal pain, clear alleviating or exacerbating factors.    Home Medications Prior to Admission medications   Medication Sig Start Date End Date Taking? Authorizing Provider  butalbital-acetaminophen-caffeine (FIORICET) 50-325-40 MG tablet Take 2 tablets by mouth every 6 (six) hours as needed for headache (first line for headache).   Not to exceed 6 tablets per day 03/05/23   Bayard Hugger, NP  diclofenac Sodium (VOLTAREN) 1 % GEL Apply 2 g topically 2 (two) times daily. 02/26/23   Angiulli, Lavon Paganini, PA-C  DULoxetine (CYMBALTA) 30 MG capsule Take 2 capsules (60mg ) by mouth daily at 8:00am and 1 capsule (30mg ) nightly at 10:00pm 02/26/23   Angiulli, Lavon Paganini, PA-C  famotidine (PEPCID) 20 MG tablet Take 1 tablet (20 mg total) by mouth 2 (two) times daily. 02/26/23   Angiulli, Lavon Paganini, PA-C  levETIRAcetam (KEPPRA) 750 MG tablet Take 1 tablet (750 mg total) by mouth 2 (two) times daily. 02/26/23   Angiulli, Lavon Paganini, PA-C  methocarbamol (ROBAXIN) 500 MG tablet Take 1 tablet (500 mg total) by mouth every 6 (six) hours as needed for muscle spasms. 03/07/23   Bayard Hugger, NP  polyethylene glycol (MIRALAX / GLYCOLAX) 17 g packet Take 17 g by mouth 2 (two) times daily. 02/26/23   Angiulli, Lavon Paganini, PA-C  topiramate (TOPAMAX) 50 MG tablet Take 1 tablet (50 mg total) by mouth 2 (two) times daily. 03/07/23   Bayard Hugger, NP  Vitamin D,  Ergocalciferol, (DRISDOL) 1.25 MG (50000 UNIT) CAPS capsule Take 1 capsule (50,000 Units total) by mouth once a week. 02/26/23   Angiulli, Lavon Paganini, PA-C  omeprazole (PRILOSEC) 20 MG capsule Take 1 capsule (20 mg total) by mouth daily. Patient not taking: Reported on 01/16/2017 09/21/15 03/06/20  Forde Dandy, MD  phentermine 37.5 MG capsule Take 37.5 mg by mouth every morning.  03/06/20  [provider]      Allergies    Dilaudid [hydromorphone hcl], Morphine and related, and Vicodin [hydrocodone-acetaminophen]    Review of Systems   Review of Systems  All other systems reviewed and are negative.   Physical Exam Updated Vital Signs BP 108/66   Pulse 97   Temp 98.6 F (37 C) (Oral)   Resp (!) 23   Ht 5\' 7"  (1.702 m)   Wt 127 kg   SpO2 100%   BMI 43.85 kg/m  Physical Exam Vitals and nursing note reviewed.  Constitutional:      General: She is not in acute distress.    Appearance: She is well-developed.  HENT:     Head: Normocephalic.   Eyes:     Conjunctiva/sclera: Conjunctivae normal.  Cardiovascular:     Rate and Rhythm: Normal rate and regular rhythm.  Pulmonary:     Effort: Pulmonary effort is normal. No respiratory distress.     Breath sounds: Normal breath sounds. No stridor.  Abdominal:     General: There is no distension.  Skin:    General: Skin is warm and dry.       Neurological:     Mental Status: She is alert and oriented to person, place, and time.     Cranial Nerves: No cranial nerve deficit.  Psychiatric:        Mood and Affect: Mood normal.     ED Results / Procedures / Treatments   Labs (all labs ordered are listed, but only abnormal results are displayed) Labs Reviewed  COMPREHENSIVE METABOLIC PANEL - Abnormal; Notable for the following components:      Result Value   Glucose, Bld 127 (*)    BUN 5 (*)    Albumin 3.3 (*)    All other components within normal limits  CBC - Abnormal; Notable for the following components:   RBC 3.62  (*)    Hemoglobin 9.4 (*)    HCT 30.2 (*)    All other components within normal limits  D-DIMER, QUANTITATIVE - Abnormal; Notable for the following components:   D-Dimer, Quant 9.02 (*)    All other components within normal limits  LIPASE, BLOOD  BRAIN NATRIURETIC PEPTIDE    EKG None  Radiology CT Angio Chest PE W/Cm &/Or Wo Cm  Result Date: 03/09/2023 CLINICAL DATA:  Pulmonary embolism (PE) suspected, low to intermediate prob, positive D-dimer EXAM: CT ANGIOGRAPHY CHEST WITH CONTRAST TECHNIQUE: Multidetector CT imaging of the chest was performed using the standard protocol during bolus administration of intravenous contrast. Multiplanar CT image reconstructions and MIPs were obtained to evaluate the vascular anatomy. RADIATION DOSE REDUCTION: This exam was performed according to the departmental dose-optimization program which includes automated exposure control, adjustment of the mA and/or kV according to patient size and/or use of iterative reconstruction technique. CONTRAST:  59mL OMNIPAQUE IOHEXOL 350 MG/ML SOLN COMPARISON:  Chest XR, 03/09/2023.  CT chest, 06/22/2013. FINDINGS: Suboptimal evaluation, secondary to motion degradation. Cardiovascular: Satisfactory opacification of the pulmonary arteries to the segmental level. POSITIVE segmental pulmonary emboli within the LEFT inferior lobar artery. See key image. Normal heart size. No pericardial effusion.  RV/LV ratio 1.0 Mediastinum/Nodes: No enlarged mediastinal, hilar, or axillary lymph nodes. Thyroid gland, trachea, and esophagus demonstrate no significant findings. Lungs/Pleura: Pulmonary hypoinflation with bibasilar dependent and linear consolidations consistent with atelectasis. No pleural effusion or pneumothorax. Upper Abdomen: No acute abnormality. Surgical changes of gastric bypass. Musculoskeletal: No chest wall abnormality. No acute or significant osseous findings. Review of the MIP images confirms the above findings. IMPRESSION:  Examination is POSITIVE for segmental pulmonary emboli within the LEFT inferior lobe. No CT evidence of RIGHT heart strain, with RV/LV ratio 1.0. Electronically Signed   By: Michaelle Birks M.D.   On: 03/09/2023 18:15   DG Chest 2 View  Result Date: 03/09/2023 CLINICAL DATA:  Provided history: Pain in right ribs.  Chest pain. EXAM: CHEST - 2 VIEW COMPARISON:  Prior chest radiographs 10/04/2022 and earlier FINDINGS: Shallow inspiration radiograph, accentuating the cardiac silhouette and limiting evaluation of heart size. Ill-defined opacities within the right lung base. No appreciable airspace consolidation on the left. No evidence of pleural effusion or pneumothorax. No acute osseous abnormality identified. IMPRESSION: 1. Shallow inspiration radiograph. 2. Ill-defined opacities within the right lung base, which may reflect atelectasis or pneumonia. 3. No acute osseous abnormality is identified. Dedicated radiographs of the right ribs may be obtained for further evaluation, as clinically warranted. Electronically Signed   By: Kellie Simmering D.O.   On: 03/09/2023 15:53    Procedures Procedures  Medications Ordered in ED Medications  apixaban (ELIQUIS) tablet 10 mg (has no administration in time range)    Followed by  apixaban (ELIQUIS) tablet 5 mg (has no administration in time range)  iohexol (OMNIPAQUE) 350 MG/ML injection 75 mL (75 mLs Intravenous Contrast Given 03/09/23 1759)  fentaNYL (SUBLIMAZE) injection 50 mcg (50 mcg Intravenous Given 03/09/23 1812)    ED Course/ Medical Decision Making/ A&P                             Medical Decision Making Patient with multiple medical problems including recent hospitalization for meningioma presents with new right lower thoracic, upper abdominal pain, and dyspnea.  Patient is awake and alert, not hypoxic, but she is tachypneic and tachycardic.  Differential includes pneumonia, hepatobiliary dysfunction, PE, musculoskeletal etiology.  Labs ordered and  monitored started. Pulse ox 99% room air normal Cardiac 105 sinus tach abnormal   Amount and/or Complexity of Data Reviewed External Data Reviewed: notes.    Details: Discharge summary with meningioma reviewed Labs: ordered. Decision-making details documented in ED Course. Radiology: ordered and independent interpretation performed. Decision-making details documented in ED Course. ECG/medicine tests: ordered and independent interpretation performed. Decision-making details documented in ED Course.  Risk Prescription drug management. Decision regarding hospitalization.   7:03 PM Patient on nasal cannula as she states that she feels better.  She remains tachypneic, though not tachycardic.  With increased work of breathing, CT evidence concerning for pulmonary embolism she will start anticoagulation with Eliquis.  We discussed this new finding, with her mother present.  Given concern for new pulmonary embolism, increased work of breathing, patient will likely benefit from echocardiogram, initiation of anticoagulation therapy the latter of which has been started here. Patient feels better with supplemental oxygen, but not truly hypoxic on room air.        Final Clinical Impression(s) / ED Diagnoses Final diagnoses:  Acute pulmonary embolism, unspecified pulmonary embolism type, unspecified whether acute cor pulmonale present ([place])   CRITICAL CARE Performed by: Carmin Muskrat Total critical care time: 35 minutes Critical care time was exclusive of separately billable procedures and treating other patients. Critical care was necessary to treat or prevent imminent or life-threatening deterioration. Critical care was time spent personally by me on the following activities: development of treatment plan with patient and/or surrogate as well as nursing, discussions with consultants, evaluation of patient's response to treatment, examination of patient, obtaining history from patient or  surrogate, ordering and performing treatments and interventions, ordering and review of laboratory studies, ordering and review of radiographic studies, pulse oximetry and re-evaluation of patient's condition.    Carmin Muskrat, MD 03/09/23 [facility]

## 2023-03-09 NOTE — H&P (Signed)
History and Physical    Evelyn [name] [id number] DOB: 1971/12/09 DOA: 03/09/2023  PCP: Loyola Mast, PA-C  Patient coming from: Home  I have personally briefly reviewed patient's old medical records in [place]  Chief Complaint: Right-sided chest discomfort  HPI: Evelyn [name] is a 52 y.o. 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  Patient states yesterday she developed right-sided abdominal pain from her groin to the right upper quadrant.  She has also had right-sided chest pain up to her shoulder.  Pain has been fairly constant but worse when she tries to eat or drink.  She has been nauseous but not throwing up.  She reports good bowel movements without diarrhea or constipation.  She reports good urine output without dysuria.  She has not had any obvious bleeding including hematochezia or melena.  She underwent left craniotomy for resection of an atypical meningioma on 02/08/2023.  She has had swelling to the left upper head area since then which has been stable per patient.  She is planned to receive radiation treatment per radiation oncology.  ED Course  Labs/Imaging on admission: I have personally reviewed following labs and imaging studies.  Initial vitals showed BP 101/65, pulse 103, RR 30, temp 98.6 F, SpO2 98% on room air.  Labs showed WBC 8.1, hemoglobin 9.4, platelets 244,000, sodium 135, potassium 3.5, bicarb 23, BUN 5, creatinine 0.6, serum glucose 127, LFTs within normal limits, BNP 10.5.  D-dimer 9.02.  2 view CXR showed ill-defined opacities within the right lung base.  CTA chest was positive for segmental pulmonary emboli within the left inferior lobe.  No CT evidence of right heart strain.  Patient was  initially given Eliquis 10 mg.  EDP subsequently discussed with on-call neurosurgery who recommended anticoagulation with Xarelto in setting of recent craniotomy and tumor resection.  Due to persistent tachypnea and dyspnea the hospitalist service was consulted to admit for further evaluation and management.  Review of Systems: All systems reviewed and are negative except as documented in history of present illness above.   Past Medical History:  Diagnosis Date   Anemia    Anxiety    Depression    GERD (gastroesophageal reflux disease)    Headache    Obesity    Seizures ([place])    Sleep apnea     Past Surgical History:  Procedure Laterality Date   ABDOMINAL HYSTERECTOMY     APPLICATION OF CRANIAL NAVIGATION N/A 02/08/2023   Procedure: APPLICATION OF CRANIAL NAVIGATION;  Surgeon: Judith Part, MD;  Location: [facility];  Service: Neurosurgery;  Laterality: N/A;   BREAST LUMPECTOMY Left 1997   CHOLECYSTECTOMY     CRANIOTOMY Left 02/08/2023   Procedure: Left craniotomy for tumor resection with stealth navigation;  Surgeon: Judith Part, MD;  Location: [facility];  Service: Neurosurgery;  Laterality: Left;   GASTRIC BYPASS  03/2017   THROAT SURGERY     removed scar tissue from throat d/t being intubated.    TUBAL LIGATION      Social History:  reports that she has never smoked. She has never used smokeless tobacco. She reports that she does not drink alcohol and does not use drugs.  Allergies  Allergen Reactions   Dilaudid [Hydromorphone Hcl] Nausea And Vomiting   Morphine And Related Other (  See Comments)    Fever and chills and convulsions   Vicodin [Hydrocodone-Acetaminophen] Nausea And Vomiting    Dizziness     Family History  Problem Relation Age of Onset   Breast cancer Mother 13   Breast cancer Paternal Aunt    Breast cancer Paternal Grandmother      Prior to Admission medications   Medication Sig Start Date End Date Taking? Authorizing Provider   butalbital-acetaminophen-caffeine (FIORICET) 50-325-40 MG tablet Take 2 tablets by mouth every 6 (six) hours as needed for headache (first line for headache).   Not to exceed 6 tablets per day 03/05/23   Bayard Hugger, NP  diclofenac Sodium (VOLTAREN) 1 % GEL Apply 2 g topically 2 (two) times daily. 02/26/23   Angiulli, Lavon Paganini, PA-C  DULoxetine (CYMBALTA) 30 MG capsule Take 2 capsules (60mg ) by mouth daily at 8:00am and 1 capsule (30mg ) nightly at 10:00pm 02/26/23   Angiulli, Lavon Paganini, PA-C  famotidine (PEPCID) 20 MG tablet Take 1 tablet (20 mg total) by mouth 2 (two) times daily. 02/26/23   Angiulli, Lavon Paganini, PA-C  levETIRAcetam (KEPPRA) 750 MG tablet Take 1 tablet (750 mg total) by mouth 2 (two) times daily. 02/26/23   Angiulli, Lavon Paganini, PA-C  methocarbamol (ROBAXIN) 500 MG tablet Take 1 tablet (500 mg total) by mouth every 6 (six) hours as needed for muscle spasms. 03/07/23   Bayard Hugger, NP  polyethylene glycol (MIRALAX / GLYCOLAX) 17 g packet Take 17 g by mouth 2 (two) times daily. 02/26/23   Angiulli, Lavon Paganini, PA-C  topiramate (TOPAMAX) 50 MG tablet Take 1 tablet (50 mg total) by mouth 2 (two) times daily. 03/07/23   Bayard Hugger, NP  Vitamin D, Ergocalciferol, (DRISDOL) 1.25 MG (50000 UNIT) CAPS capsule Take 1 capsule (50,000 Units total) by mouth once a week. 02/26/23   Angiulli, Lavon Paganini, PA-C  omeprazole (PRILOSEC) 20 MG capsule Take 1 capsule (20 mg total) by mouth daily. Patient not taking: Reported on 01/16/2017 09/21/15 03/06/20  Forde Dandy, MD  phentermine 37.5 MG capsule Take 37.5 mg by mouth every morning.  03/06/20  [provider]    Physical Exam: Vitals:   03/09/23 1913 03/09/23 1915 03/09/23 2100 03/09/23 2139  BP:  (!) 102/54    Pulse:  (!) 113    Resp:  (!) 28 (!) 37   Temp: 98.6 F (37 C)   98.9 F (37.2 C)  TempSrc: Oral     SpO2:  100%    Weight:      Height:       Constitutional: Resting in bed with head elevated.  Appears uncomfortable Eyes: EOMI,  lids and conjunctivae normal ENMT: Mucous membranes are moist. Posterior pharynx clear of any exudate or lesions.Normal dentition.  Neck: normal, supple, no masses. Respiratory: clear to auscultation bilaterally, no wheezing, no crackles. Normal respiratory effort. No accessory muscle use.  Cardiovascular: Tachycardic, no murmurs / rubs / gallops. No extremity edema. 2+ pedal pulses. Abdomen: Right-sided tenderness to light palpation, no masses palpated. No hepatosplenomegaly.  Musculoskeletal: no clubbing / cyanosis. No joint deformity upper and lower extremities. Good ROM, no contractures. Normal muscle tone.  Skin: no rashes, lesions, ulcers. No induration Neurologic: Sensation intact. Strength 5/5 in all 4.  Psychiatric: Alert and oriented x 3. Normal mood.   EKG: Personally reviewed. Sinus rhythm, rate 98, no acute ischemic changes.  Similar to prior.  Assessment/Plan Principal Problem:   Pulmonary embolism on left [facility]) Active Problems:  Abdominal pain   Atypical intracranial meningioma (HCC)   Normocytic anemia   OSA (obstructive sleep apnea)   Adjustment reaction with anxiety and depression   Evelyn [name] is a 52 y.o. 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  Assessment and Plan: * Pulmonary embolism on left (HCC) Segmental PE within the left inferior lobe seen on CT chest.  There is no CT evidence of right heart strain.  She was given a dose of Eliquis in the ED but after EDP discussed with on-call neurosurgery they recommended Xarelto for anticoagulation.  Due to tachypnea and dyspnea overnight observation was requested by the EDP. -Continue Xarelto -Supplemental O2 as needed  Abdominal pain This is her main complaint and likely contributing to her tachypnea and  tachycardia.  Pain is primarily on the right side, she is very uncomfortable while resting in bed.  Reports good bowel movements and urine output.  She has had previous cholecystectomy and Roux-en-Y gastric bypass.  LFTs and lipase are normal. -Obtain CT abdomen/pelvis -Tylenol and fentanyl for pain prn (patient reports adverse reaction to morphine and Dilaudid) -Antiemetics as needed, advance diet as tolerated if CT reassuring  Atypical intracranial meningioma (HCC) S/p left craniotomy and resection by Dr. Venetia Constable 02/08/2023.  Has chronic swelling to her left forehead which patient states is stable.  She is planned to start radiation treatment per Rad/Onc Dr. Isidore Moos. .  She has a previous history of seizure activity and chronic headaches related to this issue.   -Continue Keppra, Topamax  Normocytic anemia Hemoglobin stable.  Remote labs show history of iron deficiency.  She denies any obvious bleeding.  Adjustment reaction with anxiety and depression Continue Cymbalta.  OSA (obstructive sleep apnea) Has not been using CPAP for years due to issues with her machine.  DVT prophylaxis: rivaroxaban (XARELTO) tablet 20 mg Start: 03/31/23 1700 Rivaroxaban (XARELTO) tablet 15 mg  rivaroxaban (XARELTO) tablet 20 mg   Code Status: Full code, confirmed with patient on admission Family Communication: Discussed with patient, she has discussed with family Disposition Plan: From home, dispo pending clinical progress Consults called: None Severity of Illness: The appropriate patient status for this patient is OBSERVATION. Observation status is judged to be reasonable and necessary in order to provide the required intensity of service to ensure the patient's safety. The patient's presenting symptoms, physical exam findings, and initial radiographic and laboratory data in the context of their medical condition is felt to place them at decreased risk for further clinical deterioration. Furthermore, it is  anticipated that the patient will be medically stable for discharge from the hospital within 2 midnights of admission.   Zada Finders MD [facility]  If 7PM-7AM, please contact night-coverage [email or web address]  03/09/2023, 10:01 PM

## 2023-03-10 ENCOUNTER — Other Ambulatory Visit: Payer: Self-pay

## 2023-03-10 DIAGNOSIS — I2699 Other pulmonary embolism without acute cor pulmonale: Secondary | ICD-10-CM | POA: Diagnosis not present

## 2023-03-10 LAB — CBC
HCT: 27.1 % — ABNORMAL LOW (ref 36.0–46.0)
Hemoglobin: 8.3 g/dL — ABNORMAL LOW (ref 12.0–15.0)
MCH: 25.9 pg — ABNORMAL LOW (ref 26.0–34.0)
MCHC: 30.6 g/dL (ref 30.0–36.0)
MCV: 84.7 fL (ref 80.0–100.0)
Platelets: 235 10*3/uL (ref 150–400)
RBC: 3.2 MIL/uL — ABNORMAL LOW (ref 3.87–5.11)
RDW: 14.6 % (ref 11.5–15.5)
WBC: 8 10*3/uL (ref 4.0–10.5)
nRBC: 0 % (ref 0.0–0.2)

## 2023-03-10 LAB — COMPREHENSIVE METABOLIC PANEL
ALT: 7 U/L (ref 0–44)
AST: 12 U/L — ABNORMAL LOW (ref 15–41)
Albumin: 2.7 g/dL — ABNORMAL LOW (ref 3.5–5.0)
Alkaline Phosphatase: 58 U/L (ref 38–126)
Anion gap: 12 (ref 5–15)
BUN: 6 mg/dL (ref 6–20)
CO2: 18 mmol/L — ABNORMAL LOW (ref 22–32)
Calcium: 8.3 mg/dL — ABNORMAL LOW (ref 8.9–10.3)
Chloride: 102 mmol/L (ref 98–111)
Creatinine, Ser: 0.76 mg/dL (ref 0.44–1.00)
GFR, Estimated: >60 mL/min (ref >60)
Glucose, Bld: 92 mg/dL (ref 70–99)
Potassium: 3.5 mmol/L (ref 3.5–5.1)
Sodium: 132 mmol/L — ABNORMAL LOW (ref 135–145)
Total Bilirubin: 0.7 mg/dL (ref 0.3–1.2)
Total Protein: 6.1 g/dL — ABNORMAL LOW (ref 6.5–8.1)

## 2023-03-10 LAB — C-REACTIVE PROTEIN: CRP: 19.4 mg/dL — ABNORMAL HIGH (ref <1.0)

## 2023-03-10 LAB — HIV ANTIBODY (ROUTINE TESTING W REFLEX): HIV Screen 4th Generation wRfx: NONREACTIVE

## 2023-03-10 LAB — SEDIMENTATION RATE: Sed Rate: 74 mm/hr — ABNORMAL HIGH (ref 0–22)

## 2023-03-10 MED ORDER — CYCLOBENZAPRINE HCL 10 MG PO TABS
10.0000 mg | ORAL_TABLET | Freq: Three times a day (TID) | ORAL | Status: DC | PRN
  Administered 2023-03-10 – 2023-03-14 (×4): 10 mg via ORAL
  Filled 2023-03-10 (×4): qty 1

## 2023-03-10 MED ORDER — IBUPROFEN 400 MG PO TABS
400.0000 mg | ORAL_TABLET | Freq: Four times a day (QID) | ORAL | Status: DC | PRN
  Administered 2023-03-16: 400 mg via ORAL
  Filled 2023-03-10 (×2): qty 1

## 2023-03-10 MED ORDER — GABAPENTIN 100 MG PO CAPS
200.0000 mg | ORAL_CAPSULE | Freq: Once | ORAL | Status: AC
  Administered 2023-03-10: 200 mg via ORAL
  Filled 2023-03-10: qty 2

## 2023-03-10 MED ORDER — LIDOCAINE 5 % EX PTCH
1.0000 | MEDICATED_PATCH | CUTANEOUS | Status: DC
  Administered 2023-03-10 – 2023-03-17 (×8): 1 via TRANSDERMAL
  Filled 2023-03-10 (×8): qty 1

## 2023-03-10 MED ORDER — OXYCODONE HCL 5 MG PO TABS: 5.0000 mg | ORAL_TABLET | ORAL | Status: DC | PRN

## 2023-03-10 MED ORDER — IBUPROFEN 400 MG PO TABS
400.0000 mg | ORAL_TABLET | Freq: Once | ORAL | Status: AC
  Administered 2023-03-10: 400 mg via ORAL
  Filled 2023-03-10: qty 1

## 2023-03-10 MED ORDER — GABAPENTIN 100 MG PO CAPS
200.0000 mg | ORAL_CAPSULE | Freq: Three times a day (TID) | ORAL | Status: DC
  Administered 2023-03-10 – 2023-03-18 (×23): 200 mg via ORAL
  Filled 2023-03-10 (×23): qty 2

## 2023-03-10 MED ORDER — TRAMADOL HCL 50 MG PO TABS
50.0000 mg | ORAL_TABLET | Freq: Four times a day (QID) | ORAL | Status: DC | PRN
  Administered 2023-03-10 – 2023-03-17 (×14): 50 mg via ORAL
  Filled 2023-03-10 (×14): qty 1

## 2023-03-10 MED ORDER — BACLOFEN 10 MG PO TABS
5.0000 mg | ORAL_TABLET | Freq: Three times a day (TID) | ORAL | Status: DC
  Administered 2023-03-10: 5 mg via ORAL
  Filled 2023-03-10: qty 1

## 2023-03-10 MED ORDER — SIMETHICONE 80 MG PO CHEW
80.0000 mg | CHEWABLE_TABLET | Freq: Four times a day (QID) | ORAL | Status: DC | PRN
  Administered 2023-03-10 – 2023-03-12 (×6): 80 mg via ORAL
  Filled 2023-03-10 (×7): qty 1

## 2023-03-10 NOTE — Progress Notes (Signed)
PROGRESS NOTE  Quierra Woodis [id number] DOB: [birth year] DOA: 03/09/2023 PCP: Loyola Mast, PA-C   LOS: 0 days   Brief Narrative / Interim history: Carola Lacroix is a 52 y.o. 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  Workup in the ED did not establish a clear cause for her right-sided chest pain but did find a segmental PE in the left inferior lobe without evidence of right heart strain.  Per admit her, EDP discussed with neurosurgery and patient was placed on Xarelto and admitted to the hospital  Subjective / 24h Interval events: She complains of persistent right-sided chest pain, pleuritic in nature.  She complains of persistent right-sided chest pain, pleuritic in nature.  Still has oxygen on, she is extremely afraid of taking it off  Assesement and Plan: Principal Problem:   Pulmonary embolism on left [facility]) Active Problems:   Abdominal pain   Atypical intracranial meningioma (HCC)   Normocytic anemia   OSA (obstructive sleep apnea)   Adjustment reaction with anxiety and depression   Principal problem Pulmonary embolism on left ([place]) - Segmental PE within the left inferior lobe seen on CT chest.  There is no CT evidence of right heart strain.  She was given a dose of Eliquis in the ED but after EDP discussed with on-call neurosurgery they recommended Xarelto for anticoagulation.  Continue Xarelto, seems to be tolerating well   Active problems Right-sided chest, right-sided abdominal pain -this is the main complaint that brought her to the hospital.  She feels like this is severe at times, she is unable to take a deep breath.  CT scan of the chest abdomen pelvis was without acute findings.  She is extremely tender on chest  palpation as well, suggesting a musculoskeletal component to it.  It is unclear, possibly neuropathic type pain?  Will try to control with tramadol (she is allergic to most oral narcotics, cannot tolerate oxycodone, hydrocodone), ibuprofen, local lidocaine patch as well as gabapentin  Atypical intracranial meningioma (HCC) - S/p left craniotomy and resection by Dr. Venetia Constable 02/08/2023.  Has chronic swelling to her left forehead which patient states is stable.  She is planned to start radiation treatment per Rad/Onc Dr. Isidore Moos. Continue Keppra, Topamax   Normocytic anemia - Hemoglobin stable.  Remote labs show history of iron deficiency.  She denies any obvious bleeding.   Adjustment reaction with anxiety and depression - Continue Cymbalta.   OSA (obstructive sleep apnea) - Has not been using CPAP for years due to issues with her machine.  Scheduled Meds:  DULoxetine  30 mg Oral QHS   DULoxetine  60 mg Oral Daily   famotidine  20 mg Oral BID   levETIRAcetam  750 mg Oral BID   lidocaine  1 patch Transdermal Q24H   Rivaroxaban  15 mg Oral BID WC   Followed by   Derrill Memo ON 03/31/2023] rivaroxaban  20 mg Oral Q supper   sodium chloride flush  3 mL Intravenous Q12H   topiramate  50 mg Oral BID   Continuous Infusions: PRN Meds:.acetaminophen, cyclobenzaprine, ondansetron **OR** ondansetron (ZOFRAN) IV, senna-docusate, simethicone, traMADol  Current Outpatient Medications  Medication Instructions   acetaminophen (TYLENOL) 1,000 mg, Oral, 2 times daily   butalbital-acetaminophen-caffeine (FIORICET) 50-325-40 MG tablet Take 2 tablets by mouth every 6 (six) hours  as needed for headache (first line for headache).   Not to exceed 6 tablets per day   diclofenac Sodium (VOLTAREN) 2 g, Topical, 2 times daily   DULoxetine (CYMBALTA) 30 MG capsule Take 2 capsules (60mg ) by mouth daily at 8:00am and 1 capsule (30mg ) nightly at 10:00pm   famotidine (PEPCID) 20 mg, Oral, 2 times daily   levETIRAcetam  (KEPPRA) 750 mg, Oral, 2 times daily   methocarbamol (ROBAXIN) 500 mg, Oral, Every 6 hours PRN   polyethylene glycol (MIRALAX / GLYCOLAX) 17 g, Oral, 2 times daily   topiramate (TOPAMAX) 50 mg, Oral, 2 times daily   Vitamin D (Ergocalciferol) (DRISDOL) 50,000 Units, Oral, Weekly    Diet Orders (From admission, onward)     Start     Ordered   03/10/23 0955  Diet regular Fluid consistency: Thin  Diet effective now       Question:  Fluid consistency:  Answer:  Thin   03/10/23 0955            DVT prophylaxis:  Rivaroxaban (XARELTO) tablet 15 mg  rivaroxaban (XARELTO) tablet 20 mg   Lab Results  Component Value Date   PLT 235 03/10/2023      Code Status: Full Code  Family Communication: uncle, aunt, mother at bedside  Status is: Observation  The patient will require care spanning > 2 midnights and should be moved to inpatient because: poor pain control  Level of care: Telemetry Medical  Consultants:  none  Objective: Vitals:   03/10/23 0530 03/10/23 0551 03/10/23 1032 03/10/23 1213  BP: 106/67  116/67 118/70  Pulse: 80  97 98  Resp: 19  20 18   Temp:  99.2 F (37.3 C) 99.5 F (37.5 C) 99.8 F (37.7 C)  TempSrc:  Oral Oral Oral  SpO2: 100%  100% 100%  Weight:      Height:        Intake/Output Summary (Last 24 hours) at 03/10/2023 1538 Last data filed at 03/10/2023 [id number] Gross per 24 hour  Intake 3 ml  Output --  Net 3 ml   Wt Readings from Last 3 Encounters:  03/09/23 127 kg  03/07/23 126.1 kg  02/27/23 128.5 kg    Examination:  Constitutional: NAD Eyes: no scleral icterus ENMT: Mucous membranes are moist.  Neck: normal, supple Respiratory: clear to auscultation bilaterally, no wheezing, no crackles.  Shallow respirations Cardiovascular: Regular rate and rhythm, no murmurs / rubs / gallops. No LE edema.  Abdomen: non distended, no tenderness. Bowel sounds positive.  Musculoskeletal: no clubbing / cyanosis.   Data Reviewed: I have independently  reviewed following labs and imaging studies   CBC Recent Labs  Lab 03/09/23 1511 03/10/23 0555  WBC 8.1 8.0  HGB 9.4* 8.3*  HCT 30.2* 27.1*  PLT 244 235  MCV 83.4 84.7  MCH 26.0 25.9*  MCHC 31.1 30.6  RDW 14.6 14.6    Recent Labs  Lab 03/09/23 1511 03/09/23 1534 03/09/23 1535 03/10/23 0555  NA 135  --   --  132*  K 3.5  --   --  3.5  CL 104  --   --  102  CO2 23  --   --  18*  GLUCOSE 127*  --   --  92  BUN 5*  --   --  6  CREATININE 0.86  --   --  0.76  CALCIUM 8.9  --   --  8.3*  AST 15  --   --  12*  ALT 12  --   --  7  ALKPHOS 66  --   --  58  BILITOT 0.5  --   --  0.7  ALBUMIN 3.3*  --   --  2.7*  CRP  --   --   --  19.4*  DDIMER  --   --  9.02*  --   BNP  --  10.5  --   --     ------------------------------------------------------------------------------------------------------------------ No results for input(s): "CHOL", "HDL", "LDLCALC", "TRIG", "CHOLHDL", "LDLDIRECT" in the last 72 hours.  No results found for: "HGBA1C" ------------------------------------------------------------------------------------------------------------------ No results for input(s): "TSH", "T4TOTAL", "T3FREE", "THYROIDAB" in the last 72 hours.  Invalid input(s): "FREET3"  Cardiac Enzymes No results for input(s): "CKMB", "TROPONINI", "MYOGLOBIN" in the last 168 hours.  Invalid input(s): "CK" ------------------------------------------------------------------------------------------------------------------    Component Value Date/Time   BNP 10.5 03/09/2023 1534    CBG: No results for input(s): "GLUCAP" in the last 168 hours.  No results found for this or any previous visit (from the past 240 hour(s)).   Radiology Studies: CT ABDOMEN PELVIS W CONTRAST  Result Date: 03/09/2023 CLINICAL DATA:  Abdominal pain. EXAM: CT ABDOMEN AND PELVIS WITH CONTRAST TECHNIQUE: Multidetector CT imaging of the abdomen and pelvis was performed using the standard protocol following bolus  administration of intravenous contrast. RADIATION DOSE REDUCTION: This exam was performed according to the departmental dose-optimization program which includes automated exposure control, adjustment of the mA and/or kV according to patient size and/or use of iterative reconstruction technique. CONTRAST:  88mL OMNIPAQUE IOHEXOL 350 MG/ML SOLN COMPARISON:  12/08/2019. FINDINGS: Lower chest: Patchy atelectasis or infiltrate is noted in the lung bases. Hepatobiliary: There is redemonstration of a 2.8 cm hypodensity in the left lobe of the liver, previously characterized as hemangioma. A 9 mm hypodensity is present in the right lobe of the liver, possible cyst or hemangioma. No biliary ductal dilatation. The gallbladder is surgically absent. Pancreas: Unremarkable. No pancreatic ductal dilatation or surrounding inflammatory changes. Spleen: Normal in size without focal abnormality. Adrenals/Urinary Tract: No adrenal nodule or mass. The kidneys enhance symmetrically. Evaluation for renal calculus is limited due to excreted contrast at the renal pyramids. No hydroureteronephrosis. The bladder is unremarkable. Stomach/Bowel: Gastric surgical changes are noted. Appendix appears normal. No evidence of bowel wall thickening, distention, or inflammatory changes. No free air or pneumatosis. Vascular/Lymphatic: Aortic atherosclerosis. No enlarged abdominal or pelvic lymph nodes. Reproductive: Status post hysterectomy. No adnexal masses. Other: No abdominopelvic ascites. Musculoskeletal: Degenerative changes are present in the thoracolumbar spine. No acute osseous abnormality. IMPRESSION: 1. Patchy atelectasis or infiltrate at the lung bases bilaterally. 2. No other acute process in the abdomen and pelvis. Electronically Signed   By: Brett Fairy M.D.   On: 03/09/2023 22:50   CT Angio Chest PE W/Cm &/Or Wo Cm  Result Date: 03/09/2023 CLINICAL DATA:  Pulmonary embolism (PE) suspected, low to intermediate prob, positive  D-dimer EXAM: CT ANGIOGRAPHY CHEST WITH CONTRAST TECHNIQUE: Multidetector CT imaging of the chest was performed using the standard protocol during bolus administration of intravenous contrast. Multiplanar CT image reconstructions and MIPs were obtained to evaluate the vascular anatomy. RADIATION DOSE REDUCTION: This exam was performed according to the departmental dose-optimization program which includes automated exposure control, adjustment of the mA and/or kV according to patient size and/or use of iterative reconstruction technique. CONTRAST:  78mL OMNIPAQUE IOHEXOL 350 MG/ML SOLN COMPARISON:  Chest XR, 03/09/2023.  CT chest, 06/22/2013. FINDINGS: Suboptimal evaluation, secondary to motion degradation. Cardiovascular: Satisfactory opacification of the  pulmonary arteries to the segmental level. POSITIVE segmental pulmonary emboli within the LEFT inferior lobar artery. See key image. Normal heart size. No pericardial effusion.  RV/LV ratio 1.0 Mediastinum/Nodes: No enlarged mediastinal, hilar, or axillary lymph nodes. Thyroid gland, trachea, and esophagus demonstrate no significant findings. Lungs/Pleura: Pulmonary hypoinflation with bibasilar dependent and linear consolidations consistent with atelectasis. No pleural effusion or pneumothorax. Upper Abdomen: No acute abnormality. Surgical changes of gastric bypass. Musculoskeletal: No chest wall abnormality. No acute or significant osseous findings. Review of the MIP images confirms the above findings. IMPRESSION: Examination is POSITIVE for segmental pulmonary emboli within the LEFT inferior lobe. No CT evidence of RIGHT heart strain, with RV/LV ratio 1.0. Electronically Signed   By: Michaelle Birks M.D.   On: 03/09/2023 18:15   DG Chest 2 View  Result Date: 03/09/2023 CLINICAL DATA:  Provided history: Pain in right ribs.  Chest pain. EXAM: CHEST - 2 VIEW COMPARISON:  Prior chest radiographs 10/04/2022 and earlier FINDINGS: Shallow inspiration radiograph,  accentuating the cardiac silhouette and limiting evaluation of heart size. Ill-defined opacities within the right lung base. No appreciable airspace consolidation on the left. No evidence of pleural effusion or pneumothorax. No acute osseous abnormality identified. IMPRESSION: 1. Shallow inspiration radiograph. 2. Ill-defined opacities within the right lung base, which may reflect atelectasis or pneumonia. 3. No acute osseous abnormality is identified. Dedicated radiographs of the right ribs may be obtained for further evaluation, as clinically warranted. Electronically Signed   By: Kellie Simmering D.O.   On: 03/09/2023 15:53     Marzetta Board, MD, PhD [facility]  Between 7 am - 7 pm I am available, please contact me via Amion (for emergencies) or Securechat (non urgent messages)  Between 7 pm - 7 am I am not available, please contact night coverage MD/APP via Amion

## 2023-03-10 NOTE — ED Notes (Signed)
ED TO INPATIENT HANDOFF REPORT  ED Nurse Name and Phone #:  Su Grand [phone]  S Name/Age/Gender Evelyn [name] 52 y.o. female Room/Bed: 040C/040C  Code Status   Code Status: Full Code  Home/SNF/Other Home Patient oriented to: self, place, time, and situation Is this baseline? Yes   Triage Complete: Triage complete  Chief Complaint Pulmonary embolism on left [facility]) [I26.99]  Triage Note Pt BIBA from home. Pt c/o gradual onset of right sided rib/RUQ pain since yesterday, worsening today. Pt c/o nausea, denies emesis, diarrhea.Pt endorses cough and SHOB with increased pain. Pain is worse when laying flat.  110HR 130/70 97% RA   18 L AC   Allergies Allergies  Allergen Reactions   Dilaudid [Hydromorphone Hcl] Nausea And Vomiting   Morphine And Related Other (See Comments)    Fever and chills and convulsions   Vicodin [Hydrocodone-Acetaminophen] Nausea And Vomiting    Dizziness     Level of Care/Admitting Diagnosis ED Disposition     ED Disposition  Admit   Condition  --   Comment  Hospital Area: [place] [[id number]]  Level of Care: Telemetry Medical [104]  May place patient in observation at [facility] or [place] if equivalent level of care is available:: No  Covid Evaluation: Asymptomatic - no recent exposure (last 10 days) testing not required  Diagnosis: [id number]  Admitting Physician: Lenore Cordia [id number]  Attending Physician: Lenore Cordia [id number]          B Medical/Surgery History Past Medical History:  Diagnosis Date   Anemia    Anxiety    Depression    GERD (gastroesophageal reflux disease)    Headache    Obesity    Seizures ([place])    Sleep apnea    Past Surgical History:  Procedure Laterality Date   ABDOMINAL HYSTERECTOMY     APPLICATION OF CRANIAL NAVIGATION N/A 02/08/2023   Procedure: APPLICATION OF CRANIAL NAVIGATION;  Surgeon: Judith Part, MD;  Location:  [place];  Service: Neurosurgery;  Laterality: N/A;   BREAST LUMPECTOMY Left 1997   CHOLECYSTECTOMY     CRANIOTOMY Left 02/08/2023   Procedure: 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    TUBAL LIGATION       A IV Location/Drains/Wounds Patient Lines/Drains/Airways Status     Active Line/Drains/Airways     Name Placement date Placement time Site Days   Peripheral IV 03/09/23 18 G Left Antecubital 03/09/23  --  Antecubital  1            Intake/Output Last 24 hours No intake or output data in the 24 hours ending 03/10/23 0912  Labs/Imaging Results for orders placed or performed during the hospital encounter of 03/09/23 (from the past 48 hour(s))  Lipase, blood     Status: None   Collection Time: 03/09/23  3:11 PM  Result Value Ref Range   Lipase 35 11 - 51 U/L    Comment: Performed at [facility], [place] [address] [place], [place] [address]  Comprehensive metabolic panel     Status: Abnormal   Collection Time: 03/09/23  3:11 PM  Result Value Ref Range   Sodium 135 135 - 145 mmol/L   Potassium 3.5 3.5 -  5.1 mmol/L   Chloride 104 98 - 111 mmol/L   CO2 23 22 - 32 mmol/L   Glucose, Bld 127 (H) 70 - 99 mg/dL    Comment: Glucose reference range applies only to samples taken after fasting for at least 8 hours.   BUN 5 (L) 6 - 20 mg/dL   Creatinine, Ser 0.86 0.44 - 1.00 mg/dL   Calcium 8.9 8.9 - 10.3 mg/dL   Total Protein 7.1 6.5 - 8.1 g/dL   Albumin 3.3 (L) 3.5 - 5.0 g/dL   AST 15 15 - 41 U/L   ALT 12 0 - 44 U/L   Alkaline Phosphatase 66 38 - 126 U/L   Total Bilirubin 0.5 0.3 - 1.2 mg/dL   GFR, Estimated >60 >60 mL/min    Comment: (NOTE) Calculated using the CKD-EPI Creatinine Equation (2021)    Anion gap 8 5 - 15    Comment: Performed at [facility], [place] [address] [place], [place] [address]  CBC     Status: Abnormal   Collection Time: 03/09/23  3:11 PM  Result Value Ref Range   WBC 8.1 4.0 - 10.5 K/uL   RBC 3.62 (L) 3.87 - 5.11 MIL/uL   Hemoglobin 9.4 (L) 12.0 - 15.0 g/dL   HCT 30.2 (L) 36.0 - 46.0 %   MCV 83.4 80.0 - 100.0 fL   MCH 26.0 26.0 - 34.0 pg   MCHC 31.1 30.0 - 36.0 g/dL   RDW 14.6 11.5 - 15.5 %   Platelets 244 150 - 400 K/uL   nRBC 0.0 0.0 - 0.2 %    Comment: Performed at [facility], [place] [address] [place], [place] [address]  Brain natriuretic peptide     Status: None   Collection Time: 03/09/23  3:34 PM  Result Value Ref Range   B Natriuretic Peptide 10.5 0.0 - 100.0 pg/mL    Comment: Performed at [facility] [address] [place], [place] [address]  D-dimer, quantitative     Status: Abnormal   Collection Time: 03/09/23  3:35 PM  Result Value Ref Range   D-Dimer, Quant 9.02 (H) 0.00 - 0.50 ug/mL-FEU    Comment: (NOTE) At the manufacturer cut-off value of 0.5 g/mL FEU, this assay has a negative predictive value of 95-100%.This assay is intended for use in conjunction with a clinical pretest probability (PTP) assessment model to exclude pulmonary embolism (PE) and deep venous thrombosis (DVT) in outpatients suspected of PE or DVT. Results should be correlated with clinical presentation. Performed at [facility], [place] [address], [place], [place] [address]   CBC     Status: Abnormal   Collection Time: 03/10/23  5:55 AM  Result Value Ref Range   WBC 8.0 4.0 - 10.5 K/uL   RBC 3.20 (L) 3.87 - 5.11 MIL/uL   Hemoglobin 8.3 (L) 12.0 - 15.0 g/dL   HCT 27.1 (L) 36.0 - 46.0 %   MCV 84.7 80.0 - 100.0 fL   MCH 25.9 (L) 26.0 - 34.0 pg   MCHC 30.6 30.0 - 36.0 g/dL   RDW 14.6 11.5 - 15.5 %   Platelets 235 150 - 400 K/uL   nRBC 0.0 0.0 - 0.2 %    Comment: Performed at [facility], [place] [address], [place], [place] [address]  Comprehensive metabolic panel     Status: Abnormal    Collection Time: 03/10/23  5:55 AM  Result Value Ref Range   Sodium 132 (  L) 135 - 145 mmol/L   Potassium 3.5 3.5 - 5.1 mmol/L   Chloride 102 98 - 111 mmol/L   CO2 18 (L) 22 - 32 mmol/L   Glucose, Bld 92 70 - 99 mg/dL    Comment: Glucose reference range applies only to samples taken after fasting for at least 8 hours.   BUN 6 6 - 20 mg/dL   Creatinine, Ser 0.76 0.44 - 1.00 mg/dL   Calcium 8.3 (L) 8.9 - 10.3 mg/dL   Total Protein 6.1 (L) 6.5 - 8.1 g/dL   Albumin 2.7 (L) 3.5 - 5.0 g/dL   AST 12 (L) 15 - 41 U/L   ALT 7 0 - 44 U/L   Alkaline Phosphatase 58 38 - 126 U/L   Total Bilirubin 0.7 0.3 - 1.2 mg/dL   GFR, Estimated >60 >60 mL/min    Comment: (NOTE) Calculated using the CKD-EPI Creatinine Equation (2021)    Anion gap 12 5 - 15    Comment: Performed at [facility], [place] [address], [place], [place] [address]   CT ABDOMEN PELVIS W CONTRAST  Result Date: 03/09/2023 CLINICAL DATA:  Abdominal pain. EXAM: CT ABDOMEN AND PELVIS WITH CONTRAST TECHNIQUE: Multidetector CT imaging of the abdomen and pelvis was performed using the standard protocol following bolus administration of intravenous contrast. RADIATION DOSE REDUCTION: This exam was performed according to the departmental dose-optimization program which includes automated exposure control, adjustment of the mA and/or kV according to patient size and/or use of iterative reconstruction technique. CONTRAST:  87mL OMNIPAQUE IOHEXOL 350 MG/ML SOLN COMPARISON:  12/08/2019. FINDINGS: Lower chest: Patchy atelectasis or infiltrate is noted in the lung bases. Hepatobiliary: There is redemonstration of a 2.8 cm hypodensity in the left lobe of the liver, previously characterized as hemangioma. A 9 mm hypodensity is present in the right lobe of the liver, possible cyst or hemangioma. No biliary ductal dilatation. The gallbladder is surgically absent. Pancreas: Unremarkable. No pancreatic ductal dilatation or surrounding inflammatory changes.  Spleen: Normal in size without focal abnormality. Adrenals/Urinary Tract: No adrenal nodule or mass. The kidneys enhance symmetrically. Evaluation for renal calculus is limited due to excreted contrast at the renal pyramids. No hydroureteronephrosis. The bladder is unremarkable. Stomach/Bowel: Gastric surgical changes are noted. Appendix appears normal. No evidence of bowel wall thickening, distention, or inflammatory changes. No free air or pneumatosis. Vascular/Lymphatic: Aortic atherosclerosis. No enlarged abdominal or pelvic lymph nodes. Reproductive: Status post hysterectomy. No adnexal masses. Other: No abdominopelvic ascites. Musculoskeletal: Degenerative changes are present in the thoracolumbar spine. No acute osseous abnormality. IMPRESSION: 1. Patchy atelectasis or infiltrate at the lung bases bilaterally. 2. No other acute process in the abdomen and pelvis. Electronically Signed   By: Brett Fairy M.D.   On: 03/09/2023 22:50   CT Angio Chest PE W/Cm &/Or Wo Cm  Result Date: 03/09/2023 CLINICAL DATA:  Pulmonary embolism (PE) suspected, low to intermediate prob, positive D-dimer EXAM: CT ANGIOGRAPHY CHEST WITH CONTRAST TECHNIQUE: Multidetector CT imaging of the chest was performed using the standard protocol during bolus administration of intravenous contrast. Multiplanar CT image reconstructions and MIPs were obtained to evaluate the vascular anatomy. RADIATION DOSE REDUCTION: This exam was performed according to the departmental dose-optimization program which includes automated exposure control, adjustment of the mA and/or kV according to patient size and/or use of iterative reconstruction technique. CONTRAST:  24mL OMNIPAQUE IOHEXOL 350 MG/ML SOLN COMPARISON:  Chest XR, 03/09/2023.  CT chest, 06/22/2013. FINDINGS: Suboptimal evaluation, secondary to motion degradation. Cardiovascular: Satisfactory opacification of  the pulmonary arteries to the segmental level. POSITIVE segmental pulmonary emboli  within the LEFT inferior lobar artery. See key image. Normal heart size. No pericardial effusion.  RV/LV ratio 1.0 Mediastinum/Nodes: No enlarged mediastinal, hilar, or axillary lymph nodes. Thyroid gland, trachea, and esophagus demonstrate no significant findings. Lungs/Pleura: Pulmonary hypoinflation with bibasilar dependent and linear consolidations consistent with atelectasis. No pleural effusion or pneumothorax. Upper Abdomen: No acute abnormality. Surgical changes of gastric bypass. Musculoskeletal: No chest wall abnormality. No acute or significant osseous findings. Review of the MIP images confirms the above findings. IMPRESSION: Examination is POSITIVE for segmental pulmonary emboli within the LEFT inferior lobe. No CT evidence of RIGHT heart strain, with RV/LV ratio 1.0. Electronically Signed   By: Michaelle Birks M.D.   On: 03/09/2023 18:15   DG Chest 2 View  Result Date: 03/09/2023 CLINICAL DATA:  Provided history: Pain in right ribs.  Chest pain. EXAM: CHEST - 2 VIEW COMPARISON:  Prior chest radiographs 10/04/2022 and earlier FINDINGS: Shallow inspiration radiograph, accentuating the cardiac silhouette and limiting evaluation of heart size. Ill-defined opacities within the right lung base. No appreciable airspace consolidation on the left. No evidence of pleural effusion or pneumothorax. No acute osseous abnormality identified. IMPRESSION: 1. Shallow inspiration radiograph. 2. Ill-defined opacities within the right lung base, which may reflect atelectasis or pneumonia. 3. No acute osseous abnormality is identified. Dedicated radiographs of the right ribs may be obtained for further evaluation, as clinically warranted. Electronically Signed   By: Kellie Simmering D.O.   On: 03/09/2023 15:53    Pending Labs Unresulted Labs (From admission, onward)     Start     Ordered   03/10/23 0500  HIV Antibody (routine testing w rflx)  (HIV Antibody (Routine testing w reflex) panel)  Tomorrow morning,   R         03/09/23 2101            Vitals/Pain Today's Vitals   03/10/23 0222 03/10/23 0530 03/10/23 0543 03/10/23 0551  BP:  106/67    Pulse:  80    Resp:  19    Temp: 98.9 F (37.2 C)   99.2 F (37.3 C)  TempSrc:    Oral  SpO2:  100%    Weight:      Height:      PainSc:   8      Isolation Precautions No active isolations  Medications Medications  Rivaroxaban (XARELTO) tablet 15 mg (15 mg Oral Given 03/10/23 0745)    Followed by  rivaroxaban (XARELTO) tablet 20 mg (has no administration in time range)  acetaminophen (TYLENOL) tablet 1,000 mg (1,000 mg Oral Given 03/09/23 2244)  sodium chloride flush (NS) 0.9 % injection 3 mL (3 mLs Intravenous Given 03/09/23 2157)  ondansetron (ZOFRAN) tablet 4 mg (has no administration in time range)    Or  ondansetron (ZOFRAN) injection 4 mg (has no administration in time range)  senna-docusate (Senokot-S) tablet 1 tablet (has no administration in time range)  DULoxetine (CYMBALTA) DR capsule 60 mg (60 mg Oral Not Given 03/10/23 0745)  famotidine (PEPCID) tablet 20 mg (20 mg Oral Given 03/09/23 2245)  levETIRAcetam (KEPPRA) tablet 750 mg (750 mg Oral Given 03/09/23 2245)  methocarbamol (ROBAXIN) tablet 500 mg (has no administration in time range)  topiramate (TOPAMAX) tablet 50 mg (50 mg Oral Given 03/09/23 2245)  DULoxetine (CYMBALTA) DR capsule 30 mg (30 mg Oral Patient Refused/Not Given 03/09/23 2245)  traMADol (ULTRAM) tablet 50 mg (has no administration in time  range)  simethicone (MYLICON) chewable tablet 80 mg (has no administration in time range)  baclofen (LIORESAL) tablet 5 mg (has no administration in time range)  iohexol (OMNIPAQUE) 350 MG/ML injection 75 mL (75 mLs Intravenous Contrast Given 03/09/23 1759)  fentaNYL (SUBLIMAZE) injection 50 mcg (50 mcg Intravenous Given 03/09/23 1812)  iohexol (OMNIPAQUE) 350 MG/ML injection 75 mL (75 mLs Intravenous Contrast Given 03/09/23 2238)    Mobility walks     Focused  Assessments GI   R Recommendations: See Admitting Provider Note  Report given to:   Additional Notes:  likes water with no ice

## 2023-03-10 NOTE — Plan of Care (Signed)
  Problem: Education: Goal: Knowledge of the prescribed therapeutic regimen will improve Outcome: Progressing   Problem: Clinical Measurements: Goal: Usual level of consciousness will be regained or maintained. Outcome: Progressing Goal: Neurologic status will improve Outcome: Progressing Goal: Ability to maintain intracranial pressure will improve Outcome: Progressing   Problem: Skin Integrity: Goal: Demonstration of wound healing without infection will improve Outcome: Progressing   Problem: Education: Goal: Knowledge of General Education information will improve Description: Including pain rating scale, medication(s)/side effects and non-pharmacologic comfort measures Outcome: Progressing   Problem: Health Behavior/Discharge Planning: Goal: Ability to manage health-related needs will improve Outcome: Progressing   Problem: Clinical Measurements: Goal: Ability to maintain clinical measurements within normal limits will improve Outcome: Progressing Goal: Will remain free from infection Outcome: Progressing Goal: Diagnostic test results will improve Outcome: Progressing Goal: Respiratory complications will improve Outcome: Progressing Goal: Cardiovascular complication will be avoided Outcome: Progressing   Problem: Activity: Goal: Risk for activity intolerance will decrease Outcome: Progressing   Problem: Nutrition: Goal: Adequate nutrition will be maintained Outcome: Progressing   Problem: Coping: Goal: Level of anxiety will decrease Outcome: Progressing   Problem: Elimination: Goal: Will not experience complications related to bowel motility Outcome: Progressing Goal: Will not experience complications related to urinary retention Outcome: Progressing   Problem: Pain Managment: Goal: General experience of comfort will improve Outcome: Progressing   Problem: Safety: Goal: Ability to remain free from injury will improve Outcome: Progressing   Problem:  Skin Integrity: Goal: Risk for impaired skin integrity will decrease Outcome: Progressing   

## 2023-03-11 ENCOUNTER — Observation Stay (HOSPITAL_COMMUNITY): Payer: Medicaid Other

## 2023-03-11 DIAGNOSIS — Z91199 Patient's noncompliance with other medical treatment and regimen due to unspecified reason: Secondary | ICD-10-CM | POA: Diagnosis not present

## 2023-03-11 DIAGNOSIS — S06320A Contusion and laceration of left cerebrum without loss of consciousness, initial encounter: Secondary | ICD-10-CM | POA: Diagnosis not present

## 2023-03-11 DIAGNOSIS — I2699 Other pulmonary embolism without acute cor pulmonale: Secondary | ICD-10-CM | POA: Diagnosis not present

## 2023-03-11 DIAGNOSIS — R519 Headache, unspecified: Secondary | ICD-10-CM | POA: Diagnosis not present

## 2023-03-11 DIAGNOSIS — G4733 Obstructive sleep apnea (adult) (pediatric): Secondary | ICD-10-CM | POA: Diagnosis present

## 2023-03-11 DIAGNOSIS — R569 Unspecified convulsions: Secondary | ICD-10-CM | POA: Diagnosis not present

## 2023-03-11 DIAGNOSIS — G40909 Epilepsy, unspecified, not intractable, without status epilepticus: Secondary | ICD-10-CM | POA: Diagnosis present

## 2023-03-11 DIAGNOSIS — R4182 Altered mental status, unspecified: Secondary | ICD-10-CM | POA: Diagnosis not present

## 2023-03-11 DIAGNOSIS — K219 Gastro-esophageal reflux disease without esophagitis: Secondary | ICD-10-CM | POA: Diagnosis present

## 2023-03-11 DIAGNOSIS — F4323 Adjustment disorder with mixed anxiety and depressed mood: Secondary | ICD-10-CM | POA: Diagnosis present

## 2023-03-11 DIAGNOSIS — D509 Iron deficiency anemia, unspecified: Secondary | ICD-10-CM | POA: Diagnosis present

## 2023-03-11 DIAGNOSIS — R0789 Other chest pain: Secondary | ICD-10-CM | POA: Diagnosis present

## 2023-03-11 DIAGNOSIS — D420 Neoplasm of uncertain behavior of cerebral meninges: Secondary | ICD-10-CM | POA: Diagnosis not present

## 2023-03-11 DIAGNOSIS — Z86011 Personal history of benign neoplasm of the brain: Secondary | ICD-10-CM | POA: Diagnosis not present

## 2023-03-11 DIAGNOSIS — Z90710 Acquired absence of both cervix and uterus: Secondary | ICD-10-CM | POA: Diagnosis not present

## 2023-03-11 DIAGNOSIS — E871 Hypo-osmolality and hyponatremia: Secondary | ICD-10-CM | POA: Diagnosis not present

## 2023-03-11 DIAGNOSIS — Z1152 Encounter for screening for COVID-19: Secondary | ICD-10-CM | POA: Diagnosis not present

## 2023-03-11 DIAGNOSIS — Z803 Family history of malignant neoplasm of breast: Secondary | ICD-10-CM | POA: Diagnosis not present

## 2023-03-11 DIAGNOSIS — M549 Dorsalgia, unspecified: Secondary | ICD-10-CM | POA: Diagnosis not present

## 2023-03-11 DIAGNOSIS — Z79899 Other long term (current) drug therapy: Secondary | ICD-10-CM | POA: Diagnosis not present

## 2023-03-11 DIAGNOSIS — D649 Anemia, unspecified: Secondary | ICD-10-CM | POA: Diagnosis not present

## 2023-03-11 DIAGNOSIS — R509 Fever, unspecified: Secondary | ICD-10-CM | POA: Diagnosis not present

## 2023-03-11 DIAGNOSIS — H5462 Unqualified visual loss, left eye, normal vision right eye: Secondary | ICD-10-CM | POA: Diagnosis present

## 2023-03-11 DIAGNOSIS — T888XXD Other specified complications of surgical and medical care, not elsewhere classified, subsequent encounter: Secondary | ICD-10-CM | POA: Diagnosis not present

## 2023-03-11 DIAGNOSIS — Z9884 Bariatric surgery status: Secondary | ICD-10-CM | POA: Diagnosis not present

## 2023-03-11 DIAGNOSIS — G96198 Other disorders of meninges, not elsewhere classified: Secondary | ICD-10-CM | POA: Diagnosis present

## 2023-03-11 DIAGNOSIS — Z9049 Acquired absence of other specified parts of digestive tract: Secondary | ICD-10-CM | POA: Diagnosis not present

## 2023-03-11 DIAGNOSIS — Z6841 Body Mass Index (BMI) 40.0 and over, adult: Secondary | ICD-10-CM | POA: Diagnosis not present

## 2023-03-11 DIAGNOSIS — M546 Pain in thoracic spine: Secondary | ICD-10-CM | POA: Diagnosis not present

## 2023-03-11 DIAGNOSIS — S06320D Contusion and laceration of left cerebrum without loss of consciousness, subsequent encounter: Secondary | ICD-10-CM | POA: Diagnosis not present

## 2023-03-11 DIAGNOSIS — S0190XD Unspecified open wound of unspecified part of head, subsequent encounter: Secondary | ICD-10-CM | POA: Diagnosis not present

## 2023-03-11 DIAGNOSIS — Z885 Allergy status to narcotic agent status: Secondary | ICD-10-CM | POA: Diagnosis not present

## 2023-03-11 DIAGNOSIS — R059 Cough, unspecified: Secondary | ICD-10-CM | POA: Diagnosis not present

## 2023-03-11 LAB — RESP PANEL BY RT-PCR (RSV, FLU A&B, COVID)  RVPGX2
Influenza A by PCR: NEGATIVE
Influenza B by PCR: NEGATIVE
Resp Syncytial Virus by PCR: NEGATIVE
SARS Coronavirus 2 by RT PCR: NEGATIVE

## 2023-03-11 LAB — MRSA NEXT GEN BY PCR, NASAL: MRSA by PCR Next Gen: NOT DETECTED

## 2023-03-11 MED ORDER — FENTANYL CITRATE PF 50 MCG/ML IJ SOSY
12.5000 ug | PREFILLED_SYRINGE | Freq: Once | INTRAMUSCULAR | Status: AC
  Administered 2023-03-11: 12.5 ug via INTRAVENOUS
  Filled 2023-03-11: qty 1

## 2023-03-11 MED ORDER — SODIUM CHLORIDE 0.9 % IV SOLN
2.0000 g | Freq: Three times a day (TID) | INTRAVENOUS | Status: DC
  Administered 2023-03-11 – 2023-03-13 (×6): 2 g via INTRAVENOUS
  Filled 2023-03-11 (×6): qty 12.5

## 2023-03-11 MED ORDER — BUTALBITAL-APAP-CAFFEINE 50-325-40 MG PO TABS
1.0000 | ORAL_TABLET | Freq: Four times a day (QID) | ORAL | Status: DC | PRN
  Administered 2023-03-11 – 2023-03-18 (×10): 1 via ORAL
  Filled 2023-03-11 (×11): qty 1

## 2023-03-11 MED ORDER — GADOBUTROL 1 MMOL/ML IV SOLN
10.0000 mL | Freq: Once | INTRAVENOUS | Status: AC | PRN
  Administered 2023-03-11: 10 mL via INTRAVENOUS

## 2023-03-11 MED ORDER — VANCOMYCIN HCL 2000 MG/400ML IV SOLN
2000.0000 mg | INTRAVENOUS | Status: DC
  Administered 2023-03-11: 2000 mg via INTRAVENOUS
  Filled 2023-03-11 (×2): qty 400

## 2023-03-11 NOTE — Progress Notes (Signed)
Pharmacy Antibiotic Note  Evelyn [name] is a 52 y.o. female admitted on 03/09/2023 with sepsis.  Pharmacy has been consulted for vanc/cefepime dosing.  Pt with recent craniotomy for meningioma. She developed a PE and now with fever and chills. Vanc/cefepime ordered empirically.  Scr 0.76 Wbc wnl  Plan: Vanc 2g IV q24>>AUC 449, scr 0.8 Cefepime 2g IV q8 Level as needed MRSA PCR  Height: 5\' 7"  (170.2 cm) Weight: 127 kg (280 lb) IBW/kg (Calculated) : 61.6  Temp (24hrs), Avg:100.2 F (37.9 C), Min:99.2 F (37.3 C), Max:101.9 F (38.8 C)  Recent Labs  Lab 03/09/23 1511 03/10/23 0555  WBC 8.1 8.0  CREATININE 0.86 0.76    Estimated Creatinine Clearance: 112.7 mL/min (by C-G formula based on SCr of 0.76 mg/dL).    Allergies  Allergen Reactions   Dilaudid [Hydromorphone Hcl] Nausea And Vomiting   Morphine And Related Other (See Comments)    Fever and chills and convulsions   Vicodin [Hydrocodone-Acetaminophen] Nausea And Vomiting    Dizziness     Antimicrobials this admission: 3/17 vanc>> 3/17 cefepime>>  Dose adjustments this admission:   Microbiology results: 3/17 blood>> 3/17 covid/flu>>neg  Onnie Boer, PharmD, BCIDP, AAHIVP, CPP Infectious Disease Pharmacist 03/11/2023 6:00 PM

## 2023-03-11 NOTE — Progress Notes (Signed)
PROGRESS NOTE  Evelyn [name] [id number] DOB: Aug 22, 1971 DOA: 03/09/2023 PCP: Loyola Mast, PA-C   LOS: 0 days   Brief Narrative / Interim history: Evelyn [name] is a 52 y.o. 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  Workup in the ED did not establish a clear cause for her right-sided chest pain but did find a segmental PE in the left inferior lobe without evidence of right heart strain.  Per admit her, EDP discussed with neurosurgery and patient was placed on Xarelto and admitted to the hospital  Subjective / 24h Interval events: Still having right-sided chest pain, but a bit better.  She is off oxygen this morning.  Complains of right hip pain as well which is new to me but she tells me it has been there since 3 days ago.  She had a high fever last night of 101.9, followed by a temp of 101.1 after Tylenol, associated with chills, she tells me her teeth were "shattering"  Assesement and Plan: Principal Problem:   Pulmonary embolism on left [facility]) Active Problems:   Abdominal pain   Atypical intracranial meningioma (HCC)   Normocytic anemia   OSA (obstructive sleep apnea)   Adjustment reaction with anxiety and depression   Principal problem Pulmonary embolism on left ([place]) - Segmental PE within the left inferior lobe seen on CT chest.  There is no CT evidence of right heart strain.  She was given a dose of Eliquis in the ED but after EDP discussed with on-call neurosurgery they recommended Xarelto for anticoagulation.  Continue Xarelto, seems to be tolerating well   Active problems Fever, new onset 3/16 evening-  While PE can cause a fever, this was close to 102, associated with shaking chills which is somewhat unusual for  thrombosing this fever.  Obtain blood cultures, obtain brain images with MRI of the brain and C-spine given recent surgery, neuropathic pain on the right side, and now fevers.  She is also complaining of a headache -Hold antibiotics without a clear source, obtain COVID/flu/RSV  Right-sided chest, right-sided abdominal pain -this is the main complaint that brought her to the hospital.  She feels like this is severe at times, she is unable to take a deep breath.  CT scan of the chest abdomen pelvis was without acute findings.  Better controlled with tramadol, lidocaine patch, gabapentin as well as Flexeril.?  Neuropathic pain, MRI pending  Atypical intracranial meningioma (HCC) - S/p left craniotomy and resection by Dr. Venetia Constable 02/08/2023.  Has chronic swelling to her left forehead which patient states is stable.  She is planned to start radiation treatment per Rad/Onc Dr. Isidore Moos. Continue Keppra, Topamax.  MRI of the brain is pending this morning in the setting of her new onset fevers   Normocytic anemia - Hemoglobin stable.  Remote labs show history of iron deficiency.  She denies any obvious bleeding.   Adjustment reaction with anxiety and depression - Continue Cymbalta.   OSA (obstructive sleep apnea) - Has not been using CPAP for years due to issues with her machine.  Scheduled Meds:  DULoxetine  30 mg Oral QHS   DULoxetine  60 mg Oral Daily   famotidine  20 mg Oral BID   gabapentin  200 mg Oral TID   levETIRAcetam  750 mg Oral BID  lidocaine  1 patch Transdermal Q24H   Rivaroxaban  15 mg Oral BID WC   Followed by   Derrill Memo ON 03/31/2023] rivaroxaban  20 mg Oral Q supper   sodium chloride flush  3 mL Intravenous Q12H   topiramate  50 mg Oral BID   Continuous Infusions: PRN Meds:.acetaminophen, cyclobenzaprine, ibuprofen, ondansetron **OR** ondansetron (ZOFRAN) IV, senna-docusate, simethicone, traMADol  Current Outpatient Medications  Medication Instructions   acetaminophen (TYLENOL)  1,000 mg, Oral, 2 times daily   butalbital-acetaminophen-caffeine (FIORICET) 50-325-40 MG tablet Take 2 tablets by mouth every 6 (six) hours as needed for headache (first line for headache).   Not to exceed 6 tablets per day   diclofenac Sodium (VOLTAREN) 2 g, Topical, 2 times daily   DULoxetine (CYMBALTA) 30 MG capsule Take 2 capsules (60mg ) by mouth daily at 8:00am and 1 capsule (30mg ) nightly at 10:00pm   famotidine (PEPCID) 20 mg, Oral, 2 times daily   levETIRAcetam (KEPPRA) 750 mg, Oral, 2 times daily   methocarbamol (ROBAXIN) 500 mg, Oral, Every 6 hours PRN   polyethylene glycol (MIRALAX / GLYCOLAX) 17 g, Oral, 2 times daily   topiramate (TOPAMAX) 50 mg, Oral, 2 times daily   Vitamin D (Ergocalciferol) (DRISDOL) 50,000 Units, Oral, Weekly    Diet Orders (From admission, onward)     Start     Ordered   03/10/23 0955  Diet regular Fluid consistency: Thin  Diet effective now       Question:  Fluid consistency:  Answer:  Thin   03/10/23 0955            DVT prophylaxis:  Rivaroxaban (XARELTO) tablet 15 mg  rivaroxaban (XARELTO) tablet 20 mg   Lab Results  Component Value Date   PLT 235 03/10/2023      Code Status: Full Code  Family Communication: uncle, aunt, mother at bedside  Status is: Observation  The patient will require care spanning > 2 midnights and should be moved to inpatient because: New onset high fever  Level of care: Telemetry Medical  Consultants:  none  Objective: Vitals:   03/10/23 1632 03/10/23 2301 03/11/23 0002 03/11/23 0352  BP: 104/64 119/71  125/77  Pulse: (!) 105 (!) 120 (!) 108 95  Resp: 18 20 19 17   Temp: 99.6 F (37.6 C) (!) 101.9 F (38.8 C) (!) 101.1 F (38.4 C) 99.2 F (37.3 C)  TempSrc: Oral Oral Oral Oral  SpO2: 100% 98% 100% 100%  Weight:      Height:        Intake/Output Summary (Last 24 hours) at 03/11/2023 0713 Last data filed at 03/10/2023 [id number] Gross per 24 hour  Intake 3 ml  Output --  Net 3 ml    Wt Readings  from Last 3 Encounters:  03/09/23 127 kg  03/07/23 126.1 kg  02/27/23 128.5 kg    Examination:  Constitutional: NAD Eyes: lids and conjunctivae normal, no scleral icterus ENMT: mmm Neck: normal, supple Respiratory: clear to auscultation bilaterally, no wheezing, no crackles.  Cardiovascular: Regular rate and rhythm, no murmurs / rubs / gallops. No LE edema. Abdomen: soft, no distention. Bowel sounds positive.  Skin: no rashes Neurologic: no focal deficits, equal strength  Data Reviewed: I have independently reviewed following labs and imaging studies   CBC Recent Labs  Lab 03/09/23 1511 03/10/23 0555  WBC 8.1 8.0  HGB 9.4* 8.3*  HCT 30.2* 27.1*  PLT 244 235  MCV 83.4 84.7  MCH 26.0 25.9*  MCHC 31.1 30.6  RDW  14.6 14.6     Recent Labs  Lab 03/09/23 1511 03/09/23 1534 03/09/23 1535 03/10/23 0555  NA 135  --   --  132*  K 3.5  --   --  3.5  CL 104  --   --  102  CO2 23  --   --  18*  GLUCOSE 127*  --   --  92  BUN 5*  --   --  6  CREATININE 0.86  --   --  0.76  CALCIUM 8.9  --   --  8.3*  AST 15  --   --  12*  ALT 12  --   --  7  ALKPHOS 66  --   --  58  BILITOT 0.5  --   --  0.7  ALBUMIN 3.3*  --   --  2.7*  CRP  --   --   --  19.4*  DDIMER  --   --  9.02*  --   BNP  --  10.5  --   --      ------------------------------------------------------------------------------------------------------------------ No results for input(s): "CHOL", "HDL", "LDLCALC", "TRIG", "CHOLHDL", "LDLDIRECT" in the last 72 hours.  No results found for: "HGBA1C" ------------------------------------------------------------------------------------------------------------------ No results for input(s): "TSH", "T4TOTAL", "T3FREE", "THYROIDAB" in the last 72 hours.  Invalid input(s): "FREET3"  Cardiac Enzymes No results for input(s): "CKMB", "TROPONINI", "MYOGLOBIN" in the last 168 hours.  Invalid input(s):  "CK" ------------------------------------------------------------------------------------------------------------------    Component Value Date/Time   BNP 10.5 03/09/2023 1534    CBG: No results for input(s): "GLUCAP" in the last 168 hours.  No results found for this or any previous visit (from the past 240 hour(s)).   Radiology Studies: No results found.   Marzetta Board, MD, PhD [facility]  Between 7 am - 7 pm I am available, please contact me via Amion (for emergencies) or Securechat (non urgent messages)  Between 7 pm - 7 am I am not available, please contact night coverage MD/APP via Amion

## 2023-03-11 NOTE — Progress Notes (Signed)
Patient's family called me into the room because they had some concerns. I went into the room and discussed their concerns. They were concerned about meninginitis and showed me a spot on her lower back left side that was tender. They also had multiple other questions so I messaged Dr. Cruzita Lederer. He called into the room and talked with them.

## 2023-03-12 ENCOUNTER — Inpatient Hospital Stay (HOSPITAL_COMMUNITY): Payer: Medicaid Other

## 2023-03-12 ENCOUNTER — Other Ambulatory Visit (HOSPITAL_COMMUNITY): Payer: Self-pay

## 2023-03-12 DIAGNOSIS — I2699 Other pulmonary embolism without acute cor pulmonale: Secondary | ICD-10-CM | POA: Diagnosis not present

## 2023-03-12 DIAGNOSIS — S0632AA Contusion and laceration of left cerebrum with loss of consciousness status unknown, initial encounter: Secondary | ICD-10-CM

## 2023-03-12 DIAGNOSIS — S06320A Contusion and laceration of left cerebrum without loss of consciousness, initial encounter: Secondary | ICD-10-CM | POA: Diagnosis not present

## 2023-03-12 DIAGNOSIS — R4182 Altered mental status, unspecified: Secondary | ICD-10-CM

## 2023-03-12 DIAGNOSIS — D420 Neoplasm of uncertain behavior of cerebral meninges: Secondary | ICD-10-CM | POA: Diagnosis not present

## 2023-03-12 DIAGNOSIS — D649 Anemia, unspecified: Secondary | ICD-10-CM

## 2023-03-12 DIAGNOSIS — R509 Fever, unspecified: Secondary | ICD-10-CM

## 2023-03-12 DIAGNOSIS — S0190XA Unspecified open wound of unspecified part of head, initial encounter: Secondary | ICD-10-CM

## 2023-03-12 LAB — CBC
HCT: 27 % — ABNORMAL LOW (ref 36.0–46.0)
Hemoglobin: 8.8 g/dL — ABNORMAL LOW (ref 12.0–15.0)
MCH: 26.3 pg (ref 26.0–34.0)
MCHC: 32.6 g/dL (ref 30.0–36.0)
MCV: 80.8 fL (ref 80.0–100.0)
Platelets: 311 10*3/uL (ref 150–400)
RBC: 3.34 MIL/uL — ABNORMAL LOW (ref 3.87–5.11)
RDW: 14.6 % (ref 11.5–15.5)
WBC: 6.8 10*3/uL (ref 4.0–10.5)
nRBC: 0 % (ref 0.0–0.2)

## 2023-03-12 LAB — COMPREHENSIVE METABOLIC PANEL
ALT: 11 U/L (ref 0–44)
AST: 13 U/L — ABNORMAL LOW (ref 15–41)
Albumin: 2.6 g/dL — ABNORMAL LOW (ref 3.5–5.0)
Alkaline Phosphatase: 53 U/L (ref 38–126)
Anion gap: 10 (ref 5–15)
BUN: 10 mg/dL (ref 6–20)
CO2: 20 mmol/L — ABNORMAL LOW (ref 22–32)
Calcium: 8.4 mg/dL — ABNORMAL LOW (ref 8.9–10.3)
Chloride: 102 mmol/L (ref 98–111)
Creatinine, Ser: 0.78 mg/dL (ref 0.44–1.00)
GFR, Estimated: >60 mL/min (ref >60)
Glucose, Bld: 105 mg/dL — ABNORMAL HIGH (ref 70–99)
Potassium: 3.6 mmol/L (ref 3.5–5.1)
Sodium: 132 mmol/L — ABNORMAL LOW (ref 135–145)
Total Bilirubin: 0.4 mg/dL (ref 0.3–1.2)
Total Protein: 6.5 g/dL (ref 6.5–8.1)

## 2023-03-12 LAB — MAGNESIUM: Magnesium: 2 mg/dL (ref 1.7–2.4)

## 2023-03-12 MED ORDER — VANCOMYCIN HCL 1250 MG/250ML IV SOLN
1250.0000 mg | Freq: Two times a day (BID) | INTRAVENOUS | Status: DC
  Administered 2023-03-12 – 2023-03-13 (×2): 1250 mg via INTRAVENOUS
  Filled 2023-03-12 (×3): qty 250

## 2023-03-12 MED ORDER — GADOBUTROL 1 MMOL/ML IV SOLN
10.0000 mL | Freq: Once | INTRAVENOUS | Status: AC | PRN
  Administered 2023-03-12: 10 mL via INTRAVENOUS

## 2023-03-12 MED ORDER — GADOBUTROL 1 MMOL/ML IV SOLN: 10.0000 mL | Freq: Once | INTRAVENOUS | Status: DC | PRN

## 2023-03-12 MED ORDER — ORAL CARE MOUTH RINSE: 15.0000 mL | OROMUCOSAL | Status: DC | PRN

## 2023-03-12 NOTE — Progress Notes (Signed)
EEG complete - results pending 

## 2023-03-12 NOTE — Consult Note (Signed)
[facility] for Infectious Disease    Date of Admission:  03/09/2023     Total days of antibiotics 2  Vancomycin  Cefepime                Reason for Consult: Fevers    Referring Provider: Cruzita Lederer Primary Care Provider: Loyola Mast, PA-C   Assessment: Evelyn [name] is a 52 y.o. female with history of recently diagnosed grade 2 meningioma. She is here with acute segmental pulmonary emboli within the left inferior lobe. Initially came for investigation of what she thought was gas pains in the right lower/upper abdomen.   Report of rigors and fevers to 102 concerning for infectious process. I suppose they could be due to acute pulmonary thrombus, but agree rigors seem atypical for that.  Her recent surgery of course would be something to continue to watch closely. Dr. Zada Finders has been notified to review MRI's to ensure nothing concerning. She has a large amount of swelling present, though I am not sure what her baseline looked like. Incision itself looks clean/intact. She reports concern for flaking/breaking down of her skin over this bulge. Nothing is open and no purulence.   C-spine MRI unrevewaling. D/W Dr. Cruzita Lederer - with complaints of hip and back pain would look at thoracolumbar spine to ensure no vertebral infection here that may be causing psoas pathology. Though her exam with sensitivity to light touch on the skin seems a bit atypical of  vertebral infection. She has no leukocytosis. BCx preliminarily are negative. No GI/GU complaints.    Plan: FU NSGY opinion FU T- and L-spine MRI  Follow pending micro  Continue current antibiotics for now Follow fever curve    Principal Problem:   Pulmonary embolism on left [facility]) Active Problems:   OSA (obstructive sleep apnea)   Atypical intracranial meningioma (HCC)   Adjustment reaction with anxiety and depression   Abdominal pain   Normocytic anemia   Acute pulmonary embolism (HCC)    DULoxetine  30 mg  Oral QHS   DULoxetine  60 mg Oral Daily   famotidine  20 mg Oral BID   gabapentin  200 mg Oral TID   levETIRAcetam  750 mg Oral BID   lidocaine  1 patch Transdermal Q24H   Rivaroxaban  15 mg Oral BID WC   Followed by   Derrill Memo ON 03/31/2023] rivaroxaban  20 mg Oral Q supper   sodium chloride flush  3 mL Intravenous Q12H   topiramate  50 mg Oral BID    HPI: Evelyn [name] is a 52 y.o. female admitted from home with right sided abdominal and chest pain.   Hx of new onset seizures, left sided visual loss in December. Work up with large left sided meningioma (final path was grade 2 meningioma) now s/p craniotomy and tumor resection 02/08/23 with Dr. Zada Finders. Also with h/o roux-en-Y gastric bypass surgery.   Since admission, she has developed fevers; reported to have "violent shaking chills with fever to 103 F" on Saturday 3/16. Blood cultures were collected and empiric antibiotics started, vancomycin + cefepime. She describes always having to live with headaches over the course of the work up from the meningioma but it has been worse acutely as well as light sensitivity.   Today she says the right hip pain is still present (earlier in our conversation said was better). She has some intense neurologic pains across the head where she was previously numb. Today she  has also noticed new painful spot over the spine. It is very tender to even light touch on just the skin.   She did still have fevers and reports chills but no further episodes of rigors. 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    Review of Systems: Review of Systems  Constitutional:  Positive for chills and fever. Negative for diaphoresis.  HENT:  Negative for congestion, sinus pain and sore throat.   Eyes:   Negative for blurred vision and double vision.       Light sensitivity   Respiratory:  Negative for cough, sputum production and shortness of breath.   Cardiovascular:  Chest pain: right diaphragmatic pain.  Gastrointestinal:  Negative for abdominal pain, diarrhea, nausea and vomiting.  Genitourinary:  Negative for dysuria and flank pain.  Musculoskeletal:  Positive for joint pain (right hip pain). Negative for myalgias and neck pain.  Skin:  Negative for rash.  Neurological:  Negative for dizziness, focal weakness and headaches.     Past Medical History:  Diagnosis Date   Anemia    Anxiety    Depression    GERD (gastroesophageal reflux disease)    Headache    Obesity    Seizures (HCC)    Sleep apnea     Social History   Tobacco Use   Smoking status: Never   Smokeless tobacco: Never  Vaping Use   Vaping Use: Never used  Substance Use Topics   Alcohol use: No   Drug use: No    Family History  Problem Relation Age of Onset   Breast cancer Mother 60   Breast cancer Paternal Aunt    Breast cancer Paternal Grandmother    Allergies  Allergen Reactions   Dilaudid [Hydromorphone Hcl] Nausea And Vomiting   Morphine And Related Other (See Comments)    Fever and chills and convulsions   Vicodin [Hydrocodone-Acetaminophen] Nausea And Vomiting    Dizziness     OBJECTIVE: Blood pressure 124/77, pulse 98, temperature 98.7 F (37.1 C), temperature source Oral, resp. rate 16, height 5\' 7"  (1.702 m), weight 127 kg, SpO2 100 %.  Physical Exam Constitutional:      Appearance: She is well-developed.  Cardiovascular:     Rate and Rhythm: Normal rate.  Pulmonary:     Effort: Pulmonary effort is normal.     Breath sounds: Normal breath sounds.  Abdominal:     General: There is no distension.     Tenderness: There is no abdominal tenderness.  Musculoskeletal:       Arms:     Comments: Tenderness with moderate / light touch. Normal appearing skin overlying area of concern.  No palpable findings that I can appreciate   Neurological:     Mental Status: She is alert.       Lab Results Lab Results  Component Value Date   WBC 6.8 03/12/2023   HGB 8.8 (L) 03/12/2023   HCT 27.0 (L) 03/12/2023   MCV 80.8 03/12/2023   PLT 311 03/12/2023    Lab Results  Component Value Date   CREATININE 0.78 03/12/2023   BUN 10 03/12/2023   NA 132 (L) 03/12/2023   K 3.6 03/12/2023   CL 102 03/12/2023   CO2 20 (L) 03/12/2023    Lab Results  Component Value Date   ALT 11 03/12/2023  AST 13 (L) 03/12/2023   ALKPHOS 53 03/12/2023   BILITOT 0.4 03/12/2023     Microbiology: Recent Results (from the past 240 hour(s))  Resp panel by RT-PCR (RSV, Flu A&B, Covid) Anterior Nasal Swab     Status: None   Collection Time: 03/11/23  7:09 AM   Specimen: Anterior Nasal Swab  Result Value Ref Range Status   SARS Coronavirus 2 by RT PCR NEGATIVE NEGATIVE Final   Influenza A by PCR NEGATIVE NEGATIVE Final   Influenza B by PCR NEGATIVE NEGATIVE Final    Comment: (NOTE) The Xpert Xpress SARS-CoV-2/FLU/RSV plus assay is intended as an aid in the diagnosis of influenza from Nasopharyngeal swab specimens and should not be used as a sole basis for treatment. Nasal washings and aspirates are unacceptable for Xpert Xpress SARS-CoV-2/FLU/RSV testing.  Fact Sheet for Patients: EntrepreneurPulse.com.au  Fact Sheet for Healthcare Providers: IncredibleEmployment.be  This test is not yet approved or cleared by the [place] FDA and has been authorized for detection and/or diagnosis of SARS-CoV-2 by FDA under an Emergency Use Authorization (EUA). This EUA will remain in effect (meaning this test can be used) for the duration of the COVID-19 declaration under Section 564(b)(1) of the Act, 21 U.S.C. section 360bbb-3(b)(1), unless the authorization is terminated or revoked.     Resp Syncytial Virus by PCR NEGATIVE NEGATIVE Final    Comment:  (NOTE) Fact Sheet for Patients: EntrepreneurPulse.com.au  Fact Sheet for Healthcare Providers: IncredibleEmployment.be  This test is not yet approved or cleared by the [place] FDA and has been authorized for detection and/or diagnosis of SARS-CoV-2 by FDA under an Emergency Use Authorization (EUA). This EUA will remain in effect (meaning this test can be used) for the duration of the COVID-19 declaration under Section 564(b)(1) of the Act, 21 U.S.C. section 360bbb-3(b)(1), unless the authorization is terminated or revoked.  Performed at [facility], [place] [address] [place], [place] [address]   Culture, blood (Routine X 2) w Reflex to ID Panel     Status: None (Preliminary result)   Collection Time: 03/11/23  7:50 AM   Specimen: BLOOD  Result Value Ref Range Status   Specimen Description BLOOD RIGHT ANTECUBITAL  Final   Special Requests   Final    BOTTLES DRAWN AEROBIC AND ANAEROBIC Blood Culture adequate volume   Culture   Final    NO GROWTH < 24 HOURS Performed at [facility], [place] [address] [place], [place] [address]    Report Status PENDING  Incomplete  Culture, blood (Routine X 2) w Reflex to ID Panel     Status: None (Preliminary result)   Collection Time: 03/11/23 11:30 AM   Specimen: BLOOD RIGHT ARM  Result Value Ref Range Status   Specimen Description BLOOD RIGHT ARM  Final   Special Requests   Final    BOTTLES DRAWN AEROBIC ONLY Blood Culture results may not be optimal due to an inadequate volume of blood received in culture bottles   Culture   Final    NO GROWTH < 24 HOURS Performed at [facility], [place] [address] [place],  [address]    Report Status PENDING  Incomplete  MRSA Next Gen by PCR, Nasal     Status: None   Collection Time: 03/11/23  6:00 PM   Specimen: Nasal Mucosa; Nasal Swab  Result Value Ref Range Status   MRSA by PCR Next Gen NOT DETECTED NOT DETECTED Final    Comment:  (NOTE)  The GeneXpert MRSA Assay (FDA approved for NASAL specimens only), is one component of a comprehensive MRSA colonization surveillance program. It is not intended to diagnose MRSA infection nor to guide or monitor treatment for MRSA infections. Test performance is not FDA approved in patients less than 27 years old. Performed at [facility], [place] [address], [place], [place] [address]     Janene Madeira, MSN, NP-C [facility] Pager: [phone]  03/12/2023 12:42 PM

## 2023-03-12 NOTE — Progress Notes (Signed)
PROGRESS NOTE  Evelyn [name] [id number] DOB: 05/04/1971 DOA: 03/09/2023 PCP: Loyola Mast, PA-C   LOS: 1 day   Brief Narrative / Interim history: Evelyn [name] is a 52 y.o. 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  Workup in the ED did not establish a clear cause for her right-sided chest pain but did find a segmental PE in the left inferior lobe without evidence of right heart strain.  Per admit her, EDP discussed with neurosurgery and patient was placed on Xarelto and admitted to the hospital  Subjective / 24h Interval events: Complains of new onset lower back pain, very sensitive to touch.  Remains with a headache, still intermittently febrile throughout the night  Assesement and Plan: Principal Problem:   Pulmonary embolism on left [facility]) Active Problems:   Abdominal pain   Atypical intracranial meningioma (HCC)   Normocytic anemia   OSA (obstructive sleep apnea)   Adjustment reaction with anxiety and depression   Acute pulmonary embolism (HCC)   Principal problem Pulmonary embolism on left (HCC) - Segmental PE within the left inferior lobe seen on CT chest.  There is no CT evidence of right heart strain.  She was given a dose of Eliquis in the ED but after EDP discussed with on-call neurosurgery they recommended Xarelto for anticoagulation.  She was placed on Xarelto, however converted to heparin today given persistent fevers and potential need for further investigations   Active problems Fever, new onset 3/16 evening-  While PE can cause a fever, this was close to 102, associated with shaking chills which is somewhat unusual for DVT used fever.  COVID/flu/RSV all negative.  Underwent brain MRI that showed a postoperative  fluid collection.  ID consulted today, appreciate input.  Discussed with Dr. Drucilla Schmidt, he recommends potential sampling of this fluid.  Neurosurgery consult pending  Right-sided chest, right-sided abdominal pain -this is the main complaint that brought her to the hospital.  She feels like this is severe at times, she is unable to take a deep breath.  CT scan of the chest abdomen pelvis was without acute findings.  Better controlled with tramadol, lidocaine patch, gabapentin as well as Flexeril.   Atypical intracranial meningioma (HCC) - S/p left craniotomy and resection by Dr. Venetia Constable 02/08/2023.  Has chronic swelling to her left forehead which patient states is stable.  She is planned to start radiation treatment per Rad/Onc Dr. Isidore Moos. Continue Keppra, Topamax.     Normocytic anemia - Hemoglobin stable.  Remote labs show history of iron deficiency.  She denies any obvious bleeding.   Adjustment reaction with anxiety and depression - Continue Cymbalta.   OSA (obstructive sleep apnea) - Has not been using CPAP for years due to issues with her machine.  Scheduled Meds:  DULoxetine  30 mg Oral QHS   DULoxetine  60 mg Oral Daily   famotidine  20 mg Oral BID   gabapentin  200 mg Oral TID   levETIRAcetam  750 mg Oral BID   lidocaine  1 patch Transdermal Q24H   Rivaroxaban  15 mg Oral BID WC   Followed by   Derrill Memo ON 03/31/2023] rivaroxaban  20 mg Oral Q supper   sodium chloride flush  3 mL Intravenous Q12H   topiramate  50 mg Oral BID   Continuous  Infusions:  ceFEPime (MAXIPIME) IV 2 g (03/12/23 0858)   vancomycin 2,000 mg (03/11/23 1958)   PRN Meds:.acetaminophen, butalbital-acetaminophen-caffeine, cyclobenzaprine, ibuprofen, ondansetron **OR** ondansetron (ZOFRAN) IV, senna-docusate, simethicone, traMADol  Current Outpatient Medications  Medication Instructions   acetaminophen (TYLENOL) 1,000 mg, Oral, 2 times daily   butalbital-acetaminophen-caffeine (FIORICET) 50-325-40 MG tablet Take  2 tablets by mouth every 6 (six) hours as needed for headache (first line for headache).   Not to exceed 6 tablets per day   diclofenac Sodium (VOLTAREN) 2 g, Topical, 2 times daily   DULoxetine (CYMBALTA) 30 MG capsule Take 2 capsules (60mg ) by mouth daily at 8:00am and 1 capsule (30mg ) nightly at 10:00pm   famotidine (PEPCID) 20 mg, Oral, 2 times daily   levETIRAcetam (KEPPRA) 750 mg, Oral, 2 times daily   methocarbamol (ROBAXIN) 500 mg, Oral, Every 6 hours PRN   polyethylene glycol (MIRALAX / GLYCOLAX) 17 g, Oral, 2 times daily   topiramate (TOPAMAX) 50 mg, Oral, 2 times daily   Vitamin D (Ergocalciferol) (DRISDOL) 50,000 Units, Oral, Weekly    Diet Orders (From admission, onward)     Start     Ordered   03/10/23 0955  Diet regular Fluid consistency: Thin  Diet effective now       Question:  Fluid consistency:  Answer:  Thin   03/10/23 0955            DVT prophylaxis:  Rivaroxaban (XARELTO) tablet 15 mg  rivaroxaban (XARELTO) tablet 20 mg   Lab Results  Component Value Date   PLT 311 03/12/2023      Code Status: Full Code  Family Communication: uncle, aunt, mother at bedside  Status is: Inpatient  Level of care: Telemetry Medical  Consultants:  none  Objective: Vitals:   03/12/23 0330 03/12/23 0430 03/12/23 0638 03/12/23 0743  BP: 102/64 (!) 108/56 105/69 95/70  Pulse: 92 89 94 95  Resp: 18 18 17 16   Temp: 100 F (37.8 C) 99 F (37.2 C) 98.5 F (36.9 C) 98.4 F (36.9 C)  TempSrc: Oral Oral Oral Oral  SpO2: 93% 94% 100% 98%  Weight:      Height:       No intake or output data in the 24 hours ending 03/12/23 1036  Wt Readings from Last 3 Encounters:  03/09/23 127 kg  03/07/23 126.1 kg  02/27/23 128.5 kg    Examination:  Constitutional: NAD Eyes: lids and conjunctivae normal, no scleral icterus ENMT: mmm Neck: normal, supple Respiratory: clear to auscultation bilaterally, no wheezing, no crackles.  Cardiovascular: Regular rate and rhythm, no  murmurs / rubs / gallops. No LE edema. Abdomen: soft, no distention, no tenderness. Bowel sounds positive.  Skin: no rashes Neurologic: no focal deficits, equal strength  Data Reviewed: I have independently reviewed following labs and imaging studies   CBC Recent Labs  Lab 03/09/23 1511 03/10/23 0555 03/12/23 0320  WBC 8.1 8.0 6.8  HGB 9.4* 8.3* 8.8*  HCT 30.2* 27.1* 27.0*  PLT 244 235 311  MCV 83.4 84.7 80.8  MCH 26.0 25.9* 26.3  MCHC 31.1 30.6 32.6  RDW 14.6 14.6 14.6     Recent Labs  Lab 03/09/23 1511 03/09/23 1534 03/09/23 1535 03/10/23 0555 03/12/23 0320  NA 135  --   --  132* 132*  K 3.5  --   --  3.5 3.6  CL 104  --   --  102 102  CO2 23  --   --  18* 20*  GLUCOSE 127*  --   --  92 105*  BUN 5*  --   --  6 10  CREATININE 0.86  --   --  0.76 0.78  CALCIUM 8.9  --   --  8.3* 8.4*  AST 15  --   --  12* 13*  ALT 12  --   --  7 11  ALKPHOS 66  --   --  58 53  BILITOT 0.5  --   --  0.7 0.4  ALBUMIN 3.3*  --   --  2.7* 2.6*  MG  --   --   --   --  2.0  CRP  --   --   --  19.4*  --   DDIMER  --   --  9.02*  --   --   BNP  --  10.5  --   --   --      ------------------------------------------------------------------------------------------------------------------ No results for input(s): "CHOL", "HDL", "LDLCALC", "TRIG", "CHOLHDL", "LDLDIRECT" in the last 72 hours.  No results found for: "HGBA1C" ------------------------------------------------------------------------------------------------------------------ No results for input(s): "TSH", "T4TOTAL", "T3FREE", "THYROIDAB" in the last 72 hours.  Invalid input(s): "FREET3"  Cardiac Enzymes No results for input(s): "CKMB", "TROPONINI", "MYOGLOBIN" in the last 168 hours.  Invalid input(s): "CK" ------------------------------------------------------------------------------------------------------------------    Component Value Date/Time   BNP 10.5 03/09/2023 1534    CBG: No results for input(s):  "GLUCAP" in the last 168 hours.  Recent Results (from the past 240 hour(s))  Resp panel by RT-PCR (RSV, Flu A&B, Covid) Anterior Nasal Swab     Status: None   Collection Time: 03/11/23  7:09 AM   Specimen: Anterior Nasal Swab  Result Value Ref Range Status   SARS Coronavirus 2 by RT PCR NEGATIVE NEGATIVE Final   Influenza A by PCR NEGATIVE NEGATIVE Final   Influenza B by PCR NEGATIVE NEGATIVE Final    Comment: (NOTE) The Xpert Xpress SARS-CoV-2/FLU/RSV plus assay is intended as an aid in the diagnosis of influenza from Nasopharyngeal swab specimens and should not be used as a sole basis for treatment. Nasal washings and aspirates are unacceptable for Xpert Xpress SARS-CoV-2/FLU/RSV testing.  Fact Sheet for Patients: EntrepreneurPulse.com.au  Fact Sheet for Healthcare Providers: IncredibleEmployment.be  This test is not yet approved or cleared by the [place] FDA and has been authorized for detection and/or diagnosis of SARS-CoV-2 by FDA under an Emergency Use Authorization (EUA). This EUA will remain in effect (meaning this test can be used) for the duration of the COVID-19 declaration under Section 564(b)(1) of the Act, 21 U.S.C. section 360bbb-3(b)(1), unless the authorization is terminated or revoked.     Resp Syncytial Virus by PCR NEGATIVE NEGATIVE Final    Comment: (NOTE) Fact Sheet for Patients: EntrepreneurPulse.com.au  Fact Sheet for Healthcare Providers: IncredibleEmployment.be  This test is not yet approved or cleared by the [place] FDA and has been authorized for detection and/or diagnosis of SARS-CoV-2 by FDA under an Emergency Use Authorization (EUA). This EUA will remain in effect (meaning this test can be used) for the duration of the COVID-19 declaration under Section 564(b)(1) of the Act, 21 U.S.C. section 360bbb-3(b)(1), unless the authorization is terminated  or revoked.  Performed at [facility], [place] [address], [place], [place] [address]   Culture, blood (Routine X 2) w Reflex to ID Panel     Status: None (Preliminary result)   Collection Time: 03/11/23  7:50 AM   Specimen: BLOOD  Result Value Ref Range Status   Specimen Description BLOOD RIGHT  ANTECUBITAL  Final   Special Requests   Final    BOTTLES DRAWN AEROBIC AND ANAEROBIC Blood Culture adequate volume   Culture   Final    NO GROWTH < 24 HOURS Performed at [facility], [address] [place], [place] [address]    Report Status PENDING  Incomplete  Culture, blood (Routine X 2) w Reflex to ID Panel     Status: None (Preliminary result)   Collection Time: 03/11/23 11:30 AM   Specimen: BLOOD RIGHT ARM  Result Value Ref Range Status   Specimen Description BLOOD RIGHT ARM  Final   Special Requests   Final    BOTTLES DRAWN AEROBIC ONLY Blood Culture results may not be optimal due to an inadequate volume of blood received in culture bottles   Culture   Final    NO GROWTH < 24 HOURS Performed at [facility], [place] [address] [place], [place] [address]    Report Status PENDING  Incomplete  MRSA Next Gen by PCR, Nasal     Status: None   Collection Time: 03/11/23  6:00 PM   Specimen: Nasal Mucosa; Nasal Swab  Result Value Ref Range Status   MRSA by PCR Next Gen NOT DETECTED NOT DETECTED Final    Comment: (NOTE) The GeneXpert MRSA Assay (FDA approved for NASAL specimens only), is one component of a comprehensive MRSA colonization surveillance program. It is not intended to diagnose MRSA infection nor to guide or monitor treatment for MRSA infections. Test performance is not FDA approved in patients less than 76 years old. Performed at [facility], [place] [address] [place], [place] [address]      Radiology Studies: No results found.   Marzetta Board, MD, PhD [facility]  Between 7 am - 7 pm I am available, please contact me via Amion (for  emergencies) or Securechat (non urgent messages)  Between 7 pm - 7 am I am not available, please contact night coverage MD/APP via Amion

## 2023-03-12 NOTE — Progress Notes (Signed)
Pharmacy Antibiotic Note  Evelyn [name] is a 52 y.o. female admitted on 03/09/2023 with sepsis.  Pharmacy has been consulted for vanc/cefepime dosing.  Pt with recent craniotomy for meningioma. She developed a PE and now with fever and chills. Vanc/cefepime ordered empirically.  MRI showed subfrontal mass fluid collection, so will change vancomycin dosing to trough dosing.  Renal function stable, Tmax 101.5, WBC WNL.  Plan: Vanc 1250mg  IV Q12H for goal trough 15-20 mcg/mL Cefepime 2gm IV Q8H Monitor renal fxn, clinical progress, vanc trough as indicated F/u ID consult  Height: 5\' 7"  (170.2 cm) Weight: 127 kg (280 lb) IBW/kg (Calculated) : 61.6  Temp (24hrs), Avg:99.3 F (37.4 C), Min:98.4 F (36.9 C), Max:101.5 F (38.6 C)  Recent Labs  Lab 03/09/23 1511 03/10/23 0555 03/12/23 0320  WBC 8.1 8.0 6.8  CREATININE 0.86 0.76 0.78     Estimated Creatinine Clearance: 112.7 mL/min (by C-G formula based on SCr of 0.78 mg/dL).    Allergies  Allergen Reactions   Dilaudid [Hydromorphone Hcl] Nausea And Vomiting   Morphine And Related Other (See Comments)    Fever and chills and convulsions   Vicodin [Hydrocodone-Acetaminophen] Nausea And Vomiting    Dizziness    Vanc 3/17 >> Cefepime 3/17 >>   3/17 BCx - NGTD 3/17 MRSA PCR - negative  Camren Henthorn D. Mina Marble, PharmD, BCPS, [place] 03/12/2023, 12:55 PM

## 2023-03-12 NOTE — Procedures (Signed)
Patient Name: Evelyn [name]  MRN: [id number]  Epilepsy Attending: Lora Havens  Referring Physician/Provider: Caren Griffins, MD  Date: 318/2024 Duration: 23.31 mins  Patient history:  52 y.o. woman s/p resection of large meningioma w/ post-op pseudomeningocele, here for abdominal / chest pain w/ PE. MRI brain personally reviewed, which shows left sided pseudomeningocele. EEG to evaluate for seizure.  Level of alertness: Awake  AEDs during EEG study: LEV, GBP, TPM  Technical aspects: This EEG study was done with scalp electrodes positioned according to the 10-20 International system of electrode placement. Electrical activity was reviewed with band pass filter of 1-70Hz , sensitivity of 7 uV/mm, display speed of 70mm/sec with a 60Hz  notched filter applied as appropriate. EEG data were recorded continuously and digitally stored.  Video monitoring was available and reviewed as appropriate.  Description: The posterior dominant rhythm consists of 9 Hz activity of moderate voltage (25-35 uV) seen predominantly in posterior head regions, symmetric and reactive to eye opening and eye closing. Drowsiness was characterized by attenuation of the posterior background rhythm. Physiologic photic driving was not seen during photic stimulation.  Hyperventilation was not performed.     IMPRESSION: This study is within normal limits. No seizures or epileptiform discharges were seen throughout the recording.  A normal interictal EEG does not exclude the diagnosis of epilepsy.  Evelyn [name] Evelyn [name]

## 2023-03-12 NOTE — Progress Notes (Signed)
   03/12/23 0235  Assess: MEWS Score  Temp (!) 101.5 F (38.6 C)  BP 106/61  MAP (mmHg) 75  Pulse Rate (!) 106  Resp 16  SpO2 94 %  O2 Device Nasal Cannula  O2 Flow Rate (L/min) 2 L/min  Assess: MEWS Score  MEWS Temp 2  MEWS Systolic 0  MEWS Pulse 1  MEWS RR 0  MEWS LOC 0  MEWS Score 3  MEWS Score Color Yellow  Assess: if the MEWS score is Yellow or Red  Were vital signs taken at a resting state? Yes  Focused Assessment No change from prior assessment  Does the patient meet 2 or more of the SIRS criteria? Yes  Does the patient have a confirmed or suspected source of infection? Yes  Provider and Rapid Response Notified? Yes  MEWS guidelines implemented  Yes, yellow  Treat  MEWS Interventions Considered administering scheduled or prn medications/treatments as ordered  Take Vital Signs  Increase Vital Sign Frequency  Yellow: Q2hr x1, continue Q4hrs until patient remains green for 12hrs  Escalate  MEWS: Escalate Yellow: Discuss with charge nurse and consider notifying provider and/or RRT  Notify: Charge Nurse/RN  Name of Charge Nurse/RN Notified [facility]  Provider Notification  Provider Name/Title Gershon Cull, NP  Date Provider Notified 03/12/23  Time Provider Notified 0302  Method of Notification  (secure chat)  Notification Reason Change in status (Update: Pt with increased temp 101.5, given prn tylenol at this time. HR better at this time, running in low 100s, as high as 140 when up out of bed. Pt is asymptomatic at this time.)  Assess: SIRS CRITERIA  SIRS Temperature  1  SIRS Pulse 1  SIRS Respirations  0  SIRS WBC 0  SIRS Score Sum  2

## 2023-03-12 NOTE — Consult Note (Signed)
Neurosurgery Consultation  Reason for Consult: Post-op fever Referring Physician: Renne Crigler  CC: Fever  HPI: This is a 52 y.o. woman admitted for right sided chest / abdominal pain, workup showed a PE, on rivaroxaban currently. She had two episodes of fever in a 4 hour time span that prompted a fever workup, has otherwise been afebrile. She has had a post-op pseudomeningocele since surgery that is unchanged, no drainage from the wound.    ROS: A 14 point ROS was performed and is negative except as noted in the HPI.   PMHx:  Past Medical History:  Diagnosis Date   Anemia    Anxiety    Depression    GERD (gastroesophageal reflux disease)    Headache    Obesity    Seizures ([place])    Sleep apnea    FamHx:  Family History  Problem Relation Age of Onset   Breast cancer Mother 11   Breast cancer Paternal Aunt    Breast cancer Paternal Grandmother    SocHx:  reports that she has never smoked. She has never used smokeless tobacco. She reports that she does not drink alcohol and does not use drugs.  Exam: Vital signs in last 24 hours: Temp:  [98.4 F (36.9 C)-101.5 F (38.6 C)] 98.7 F (37.1 C) (03/18 1047) Pulse Rate:  [89-114] 98 (03/18 1047) Resp:  [16-20] 16 (03/18 1047) BP: (95-124)/(56-77) 124/77 (03/18 1047) SpO2:  [89 %-100 %] 100 % (03/18 1047) General: Awake, alert, cooperative, lying in bed in NAD Head: Normocephalic except L temporal pseudomeningoceleand atruamatic HEENT: Neck supple Pulmonary: breathing room air comfortably, no evidence of increased work of breathing Cardiac: RRR Abdomen: S NT ND Extremities: Warm and well perfused x4 Neuro: AOx3, PERRL, EOMI, FS Strength 5/5 x4, SILTx4   Assessment and Plan: 52 y.o. woman s/p resection of large meningioma w/ post-op pseudomeningocele, here for abdominal / chest pain w/ PE. MRI brain personally reviewed, which shows left sided pseudomeningocele, no evidence of purulence in the fluid collection or early cerebritis.    -no acute neurosurgical intervention indicated at this time, stable finding since surgery and nothing to suggest purulence, no leukocytosis, currently afebrile, etc, do not recommend any further workup for a cranial infection at this point, -please call with any concerns or questions  Judith Part, MD 03/12/23 12:29 PM [facility]

## 2023-03-12 NOTE — TOC Benefit Eligibility Note (Addendum)
Patient Teacher, English as a foreign language completed.    The patient is currently admitted and upon discharge could be taking Xarelto 20 mg .  The current 30 day co-pay is $4.00.   The patient is insured through [employer] and [place], [place] Patient Advocate Specialist [place] Patient Advocate Team Direct Number: [phone]  Fax: [phone]

## 2023-03-13 ENCOUNTER — Ambulatory Visit: Payer: Medicaid Other | Admitting: Physical Therapy

## 2023-03-13 ENCOUNTER — Telehealth: Payer: Self-pay | Admitting: Radiation Therapy

## 2023-03-13 ENCOUNTER — Ambulatory Visit: Payer: Medicaid Other | Admitting: Occupational Therapy

## 2023-03-13 DIAGNOSIS — S0190XD Unspecified open wound of unspecified part of head, subsequent encounter: Secondary | ICD-10-CM

## 2023-03-13 DIAGNOSIS — I2699 Other pulmonary embolism without acute cor pulmonale: Secondary | ICD-10-CM | POA: Diagnosis not present

## 2023-03-13 DIAGNOSIS — D649 Anemia, unspecified: Secondary | ICD-10-CM | POA: Diagnosis not present

## 2023-03-13 DIAGNOSIS — D420 Neoplasm of uncertain behavior of cerebral meninges: Secondary | ICD-10-CM | POA: Diagnosis not present

## 2023-03-13 DIAGNOSIS — S06320D Contusion and laceration of left cerebrum without loss of consciousness, subsequent encounter: Secondary | ICD-10-CM

## 2023-03-13 LAB — CBC
HCT: 23.8 % — ABNORMAL LOW (ref 36.0–46.0)
Hemoglobin: 7.9 g/dL — ABNORMAL LOW (ref 12.0–15.0)
MCH: 26.4 pg (ref 26.0–34.0)
MCHC: 33.2 g/dL (ref 30.0–36.0)
MCV: 79.6 fL — ABNORMAL LOW (ref 80.0–100.0)
Platelets: 309 10*3/uL (ref 150–400)
RBC: 2.99 MIL/uL — ABNORMAL LOW (ref 3.87–5.11)
RDW: 14.6 % (ref 11.5–15.5)
WBC: 6.3 10*3/uL (ref 4.0–10.5)
nRBC: 0 % (ref 0.0–0.2)

## 2023-03-13 LAB — COMPREHENSIVE METABOLIC PANEL
ALT: 16 U/L (ref 0–44)
AST: 22 U/L (ref 15–41)
Albumin: 2.3 g/dL — ABNORMAL LOW (ref 3.5–5.0)
Alkaline Phosphatase: 54 U/L (ref 38–126)
Anion gap: 7 (ref 5–15)
BUN: 7 mg/dL (ref 6–20)
CO2: 21 mmol/L — ABNORMAL LOW (ref 22–32)
Calcium: 8.5 mg/dL — ABNORMAL LOW (ref 8.9–10.3)
Chloride: 103 mmol/L (ref 98–111)
Creatinine, Ser: 0.7 mg/dL (ref 0.44–1.00)
GFR, Estimated: >60 mL/min (ref >60)
Glucose, Bld: 104 mg/dL — ABNORMAL HIGH (ref 70–99)
Potassium: 3.7 mmol/L (ref 3.5–5.1)
Sodium: 131 mmol/L — ABNORMAL LOW (ref 135–145)
Total Bilirubin: 0.4 mg/dL (ref 0.3–1.2)
Total Protein: 6.2 g/dL — ABNORMAL LOW (ref 6.5–8.1)

## 2023-03-13 LAB — MAGNESIUM: Magnesium: 2.1 mg/dL (ref 1.7–2.4)

## 2023-03-13 MED ORDER — PROCHLORPERAZINE MALEATE 5 MG PO TABS
5.0000 mg | ORAL_TABLET | Freq: Four times a day (QID) | ORAL | Status: DC | PRN
  Filled 2023-03-13: qty 1

## 2023-03-13 MED ORDER — FENTANYL CITRATE PF 50 MCG/ML IJ SOSY
12.5000 ug | PREFILLED_SYRINGE | Freq: Once | INTRAMUSCULAR | Status: AC
  Administered 2023-03-13: 12.5 ug via INTRAVENOUS
  Filled 2023-03-13: qty 1

## 2023-03-13 MED ORDER — DIPHENHYDRAMINE HCL 25 MG PO CAPS: 25.0000 mg | ORAL_CAPSULE | Freq: Four times a day (QID) | ORAL | Status: DC | PRN

## 2023-03-13 NOTE — Progress Notes (Signed)
[facility]  Date of Admission:  03/09/2023      Total days of antibiotics 2  Vancomycin + Cefepime           ASSESSMENT: Evelyn [name] is a 52 y.o. female here for management of acute PE in setting of recently diagnosed meningioma s/p biopsy/resection 2/15.   FUO - presumably due to PE, though rigors seem atypical. Her fevers have quieted down o/n (whether this be from abx or timing, unable to tell). Dr. Sherlyn Lick agrees with radiology that this fluid does not seem to be infected. Back MRI completely negative for infectious nidus. WBC normal. Would like to monitor off abx to see how she does. Would like to keep her for 48h to monitor for more assurance this is not infection causing her fevers. Today main localizing complaints are r/t swelling over head.   Regarding onc treatment plan - will message Dr. Isidore Moos to inform her of patient's admission so we can help coordinate timing for her after acute hospital stay concludes. I am hopeful targeted date of 3/25 for tx works out if she continues to be fever free over the next 48h as we stop her abx.    PLAN: Stop antibiotics  Follow Fever curve 48h off  Will reach out to rad onc on behalf of Evelyn [name] to help with re-timing her tx.    Principal Problem:   Pulmonary embolism on left [facility]) Active Problems:   OSA (obstructive sleep apnea)   Atypical intracranial meningioma (HCC)   Adjustment reaction with anxiety and depression   Abdominal pain   Normocytic anemia   Acute pulmonary embolism (HCC)   Hematoma of left cerebral hemisphere with open cranial wound (HCC)   FUO (fever of unknown origin)    DULoxetine  30 mg Oral QHS   DULoxetine  60 mg Oral Daily   famotidine  20 mg Oral BID   gabapentin  200 mg Oral TID   levETIRAcetam  750 mg Oral BID   lidocaine  1 patch Transdermal Q24H   Rivaroxaban  15 mg Oral BID WC   Followed by   Derrill Memo ON 03/31/2023] rivaroxaban  20 mg Oral Q supper    sodium chloride flush  3 mL Intravenous Q12H   topiramate  50 mg Oral BID    SUBJECTIVE: Feeling overall poorly this morning. Still with bad headaches/facial pain from swelling on left head.  No subjective or measured fevers O/N.   Questions about her radiation plans and query's how to maneuver that.   Review of Systems: Review of Systems  Constitutional:  Negative for chills and fever.  Neurological:  Positive for tingling and headaches.    Allergies  Allergen Reactions   Dilaudid [Hydromorphone Hcl] Nausea And Vomiting   Morphine And Related Other (See Comments)    Fever and chills and convulsions   Vicodin [Hydrocodone-Acetaminophen] Nausea And Vomiting    Dizziness     OBJECTIVE: Vitals:   03/12/23 2107 03/12/23 2316 03/13/23 0329 03/13/23 0920  BP: 132/79 107/67 113/69 120/79  Pulse: (!) 108 (!) 110 91 99  Resp: 17 (!) 21 18 18   Temp: 99.5 F (37.5 C)  99.4 F (37.4 C) 100.2 F (37.9 C)  TempSrc: Oral  Oral Oral  SpO2: 100% 96% 99% 100%  Weight:      Height:       Body mass index is 43.85 kg/m.   Physical Exam Vitals reviewed.  Constitutional:  General: She is not in acute distress.    Appearance: She is well-developed. She is not ill-appearing.  HENT:     Head:     Comments: Unchanged appearance of swelling over left cranium. Incision intact with some flaking.  Pulmonary:     Effort: Pulmonary effort is normal.     Breath sounds: Normal breath sounds.  Abdominal:     General: There is no distension.     Tenderness: There is no abdominal tenderness.  Skin:    General: Skin is warm and dry.  Neurological:     Mental Status: She is alert and oriented to person, place, and time.     Lab Results Lab Results  Component Value Date   WBC 6.3 03/13/2023   HGB 7.9 (L) 03/13/2023   HCT 23.8 (L) 03/13/2023   MCV 79.6 (L) 03/13/2023   PLT 309 03/13/2023    Lab Results  Component Value Date   CREATININE 0.70 03/13/2023   BUN 7 03/13/2023    NA 131 (L) 03/13/2023   K 3.7 03/13/2023   CL 103 03/13/2023   CO2 21 (L) 03/13/2023    Lab Results  Component Value Date   ALT 16 03/13/2023   AST 22 03/13/2023   ALKPHOS 54 03/13/2023   BILITOT 0.4 03/13/2023     Microbiology: Recent Results (from the past 240 hour(s))  Resp panel by RT-PCR (RSV, Flu A&B, Covid) Anterior Nasal Swab     Status: None   Collection Time: 03/11/23  7:09 AM   Specimen: Anterior Nasal Swab  Result Value Ref Range Status   SARS Coronavirus 2 by RT PCR NEGATIVE NEGATIVE Final   Influenza A by PCR NEGATIVE NEGATIVE Final   Influenza B by PCR NEGATIVE NEGATIVE Final    Comment: (NOTE) The Xpert Xpress SARS-CoV-2/FLU/RSV plus assay is intended as an aid in the diagnosis of influenza from Nasopharyngeal swab specimens and should not be used as a sole basis for treatment. Nasal washings and aspirates are unacceptable for Xpert Xpress SARS-CoV-2/FLU/RSV testing.  Fact Sheet for Patients: EntrepreneurPulse.com.au  Fact Sheet for Healthcare Providers: IncredibleEmployment.be  This test is not yet approved or cleared by the [place] FDA and has been authorized for detection and/or diagnosis of SARS-CoV-2 by FDA under an Emergency Use Authorization (EUA). This EUA will remain in effect (meaning this test can be used) for the duration of the COVID-19 declaration under Section 564(b)(1) of the Act, 21 U.S.C. section 360bbb-3(b)(1), unless the authorization is terminated or revoked.     Resp Syncytial Virus by PCR NEGATIVE NEGATIVE Final    Comment: (NOTE) Fact Sheet for Patients: EntrepreneurPulse.com.au  Fact Sheet for Healthcare Providers: IncredibleEmployment.be  This test is not yet approved or cleared by the [place] FDA and has been authorized for detection and/or diagnosis of SARS-CoV-2 by FDA under an Emergency Use Authorization (EUA). This EUA will remain in  effect (meaning this test can be used) for the duration of the COVID-19 declaration under Section 564(b)(1) of the Act, 21 U.S.C. section 360bbb-3(b)(1), unless the authorization is terminated or revoked.  Performed at [facility], [place] [address], [place], [place] [address]   Culture, blood (Routine X 2) w Reflex to ID Panel     Status: None (Preliminary result)   Collection Time: 03/11/23  7:50 AM   Specimen: BLOOD  Result Value Ref Range Status   Specimen Description BLOOD RIGHT ANTECUBITAL  Final   Special Requests   Final    BOTTLES  DRAWN AEROBIC AND ANAEROBIC Blood Culture adequate volume   Culture   Final    NO GROWTH 2 DAYS Performed at [facility], [place] [address], [place], [place] [address]    Report Status PENDING  Incomplete  Culture, blood (Routine X 2) w Reflex to ID Panel     Status: None (Preliminary result)   Collection Time: 03/11/23 11:30 AM   Specimen: BLOOD RIGHT ARM  Result Value Ref Range Status   Specimen Description BLOOD RIGHT ARM  Final   Special Requests   Final    BOTTLES DRAWN AEROBIC ONLY Blood Culture results may not be optimal due to an inadequate volume of blood received in culture bottles   Culture   Final    NO GROWTH 2 DAYS Performed at [facility], [place] [address] [place], [place] [address]    Report Status PENDING  Incomplete  MRSA Next Gen by PCR, Nasal     Status: None   Collection Time: 03/11/23  6:00 PM   Specimen: Nasal Mucosa; Nasal Swab  Result Value Ref Range Status   MRSA by PCR Next Gen NOT DETECTED NOT DETECTED Final    Comment: (NOTE) The GeneXpert MRSA Assay (FDA approved for NASAL specimens only), is one component of a comprehensive MRSA colonization surveillance program. It is not intended to diagnose MRSA infection nor to guide or monitor treatment for MRSA infections. Test performance is not FDA approved in patients less than 52 years old. Performed at [facility], [place] [address],  [place], [place] [address]     Janene Madeira, MSN, NP-C [facility] [place] [facility] Pager: [phone]  03/13/2023

## 2023-03-13 NOTE — Telephone Encounter (Signed)
I received a message today that Evelyn [name] is in the hospital with a PE and needs to push out her radiation start date to 3/25. Her scheduled has been updated and I spoke with her mother, Evelyn [name], to confirm the date and time change. She was very thankful for the call and knows to call back if Evelyn [name] will not be out of the hospital or continues to have a fever and needs to delay her start date further.   Mont Dutton R.T.(R)(T) Radiation Special Procedures Navigator           Message Received: Today [place] Callas, NP  Eppie Gibson, MD; Lillia Carmel, RN; Caren Griffins, MD As long as she does OK and remains afebrile off antibiotics I think Monday 3/25 is reasonable. We asked her for 48 hr watch to see if any return of fevers prior to D/C  We are hopeful the fevers she has had are coming from PE. Post op fluid collection from surgery that [facility] looked at and feels non-infected.  Thank you for your help and prompt response. ~Stephanie       Previous Messages    ----- Message ----- From: Eppie Gibson, MD Sent: 03/13/2023   9:22 AM EDT To: [place] Callas, NP; Pincus Large; *  Thanks - I am ccing our navigator and nurse to coordinate care as needed. She was slated to start RT this week to her brain but we can put that on hold.  Do you think moving her start date to 3/25 is reasonable? Sarah  ----- Message ----- From: Adeline Callas, NP Sent: 03/13/2023   9:15 AM EDT To: Eppie Gibson, MD  HI Dr. Isidore Moos,  I wanted to let you know that Evelyn [name] was admitted for management of acute PE. She is also having some fevers that we are working on figuring out.  She and her mother wanted [place] to reach out to you to let you know since that plan may change.  ~Stephanie

## 2023-03-13 NOTE — Progress Notes (Signed)
Patient ID: Evelyn [name], female   DOB: 01-26-1971, 52 y.o.   MRN: [id number] Dr. Cruzita Lederer has requested that pulse ox probe be placed on patient's ear lobe due to artificial nails. Educated that this is Forensic psychologist labeled use and [employer] but will accommodate physician's request.  Haydee Salter, RN

## 2023-03-13 NOTE — Progress Notes (Signed)
Mobility Specialist Progress Note    03/13/23 1522  Mobility  Activity Refused mobility    Pt refused mobility X2 times today d/t not feeling like it. Will f/u as time permits.   [place] Mobility Specialist  Please contact via SecureChat or Rehab office at [phone]

## 2023-03-13 NOTE — Progress Notes (Signed)
PROGRESS NOTE  Evelyn [name] [id number] DOB: 01-22-1971 DOA: 03/09/2023 PCP: Loyola Mast, PA-C   LOS: 2 days   Brief Narrative / Interim history: Evelyn [name] is a 52 y.o. 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  Workup in the ED did not establish a clear cause for her right-sided chest pain but did find a segmental PE in the left inferior lobe without evidence of right heart strain.  Per admit her, EDP discussed with neurosurgery and patient was placed on Xarelto and admitted to the hospital.  Hospital course complicated by new onset high fever for which ID was consulted.  Subjective / 24h Interval events: She is doing a little bit better today.  Back pain, right-sided chest pain are slightly better.  Still complains of a headache which has been persistent since her surgery.  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  There is no CT evidence of right heart strain.  She was given a dose of Eliquis in the ED but after EDP discussed with on-call neurosurgery they recommended Xarelto for anticoagulation.  She was placed on Xarelto, seems to be tolerating well.  Continue   Active problems Fever, new onset 3/16 evening-   While PE can cause a fever, this was close to 102, associated with shaking chills which is somewhat unusual for DVT to cause high fever with chills.  COVID/flu/RSV all negative.  Underwent brain MRI that showed a postoperative fluid collection without signs of purulence.  ID and neurosurgery consulted.  Blood cultures drawn on 3/17, no growth to date.  She underwent MRI of the C, T, and L-spine due to significant tenderness to palpation, no abscesses/discitis/osteomyelitis found.  Discussed with ID today, hold further antibiotics for now, monitor for 48 hours and if she remains afebrile and cultures remain negative could go home on Thursday  Right-sided chest, right-sided abdominal pain -this is the main complaint that brought her to the hospital.  She feels like this is severe at times, she is unable to take a deep breath.  CT scan of the chest abdomen pelvis was without acute findings.  Better controlled with tramadol, lidocaine patch, gabapentin as well as Flexeril.   Atypical intracranial meningioma (HCC) - S/p left craniotomy and resection by Dr. Venetia Constable 02/08/2023.  Has chronic swelling to her left forehead which patient states is stable.  She is planned to start radiation treatment per Rad/Onc Dr. Isidore Moos tomorrow, however that was postponed until next week. Continue Keppra, Topamax.     Normocytic anemia - Hemoglobin stable.  Remote labs show history of iron deficiency.  She denies any obvious bleeding.   Adjustment reaction with anxiety and depression - Continue Cymbalta.   OSA (obstructive sleep apnea) - Has not been using CPAP for years due to  issues with her machine.  Scheduled Meds:  DULoxetine  30 mg Oral QHS   DULoxetine  60 mg Oral Daily   famotidine  20 mg Oral BID   gabapentin  200 mg Oral TID   levETIRAcetam  750 mg Oral BID   lidocaine  1 patch Transdermal Q24H   Rivaroxaban  15 mg Oral BID WC   Followed by   Derrill Memo ON 03/31/2023] rivaroxaban  20 mg Oral Q supper   sodium  chloride flush  3 mL Intravenous Q12H   topiramate  50 mg Oral BID   Continuous Infusions:  ceFEPime (MAXIPIME) IV 2 g (03/13/23 0059)   vancomycin 1,250 mg (03/13/23 0158)   PRN Meds:.acetaminophen, butalbital-acetaminophen-caffeine, cyclobenzaprine, gadobutrol, ibuprofen, ondansetron **OR** ondansetron (ZOFRAN) IV, mouth rinse, senna-docusate, simethicone, traMADol  Current Outpatient Medications  Medication Instructions   acetaminophen (TYLENOL) 1,000 mg, Oral, 2 times daily   butalbital-acetaminophen-caffeine (FIORICET) 50-325-40 MG tablet Take 2 tablets by mouth every 6 (six) hours as needed for headache (first line for headache).   Not to exceed 6 tablets per day   diclofenac Sodium (VOLTAREN) 2 g, Topical, 2 times daily   DULoxetine (CYMBALTA) 30 MG capsule Take 2 capsules (60mg ) by mouth daily at 8:00am and 1 capsule (30mg ) nightly at 10:00pm   famotidine (PEPCID) 20 mg, Oral, 2 times daily   levETIRAcetam (KEPPRA) 750 mg, Oral, 2 times daily   methocarbamol (ROBAXIN) 500 mg, Oral, Every 6 hours PRN   polyethylene glycol (MIRALAX / GLYCOLAX) 17 g, Oral, 2 times daily   topiramate (TOPAMAX) 50 mg, Oral, 2 times daily   Vitamin D (Ergocalciferol) (DRISDOL) 50,000 Units, Oral, Weekly    Diet Orders (From admission, onward)     Start     Ordered   03/10/23 0955  Diet regular Fluid consistency: Thin  Diet effective now       Question:  Fluid consistency:  Answer:  Thin   03/10/23 0955            DVT prophylaxis:  Rivaroxaban (XARELTO) tablet 15 mg  rivaroxaban (XARELTO) tablet 20 mg   Lab Results  Component Value Date   PLT 309 03/13/2023      Code Status: Full Code  Family Communication:  mother at bedside  Status is: Inpatient  Level of care: Telemetry Medical  Consultants:  none  Objective: Vitals:   03/12/23 2107 03/12/23 2316 03/13/23 0329 03/13/23 0920  BP: 132/79 107/67 113/69 120/79  Pulse: (!) 108 (!) 110 91 99  Resp: 17 (!) 21 18 18   Temp:  99.5 F (37.5 C)  99.4 F (37.4 C) 100.2 F (37.9 C)  TempSrc: Oral  Oral Oral  SpO2: 100% 96% 99% 100%  Weight:      Height:        Intake/Output Summary (Last 24 hours) at 03/13/2023 1048 Last data filed at 03/12/2023 1500 Gross per 24 hour  Intake 311.1 ml  Output --  Net 311.1 ml    Wt Readings from Last 3 Encounters:  03/09/23 127 kg  03/07/23 126.1 kg  02/27/23 128.5 kg    Examination:  Constitutional: NAD Eyes: lids and conjunctivae normal, no scleral icterus ENMT: mmm Neck: normal, supple Respiratory: clear to auscultation bilaterally, no wheezing, no crackles. Normal respiratory effort.  Cardiovascular: Regular rate and rhythm, no murmurs / rubs / gallops. No LE edema. Abdomen: soft, no distention, no tenderness. Bowel sounds positive.  Skin: no rashes Neurologic: no focal deficits, equal strength  Data Reviewed: I have independently  reviewed following labs and imaging studies   CBC Recent Labs  Lab 03/09/23 1511 03/10/23 0555 03/12/23 0320 03/13/23 0043  WBC 8.1 8.0 6.8 6.3  HGB 9.4* 8.3* 8.8* 7.9*  HCT 30.2* 27.1* 27.0* 23.8*  PLT 244 235 311 309  MCV 83.4 84.7 80.8 79.6*  MCH 26.0 25.9* 26.3 26.4  MCHC 31.1 30.6 32.6 33.2  RDW 14.6 14.6 14.6 14.6     Recent Labs  Lab 03/09/23 1511 03/09/23 1534 03/09/23 1535 03/10/23 0555 03/12/23 0320 03/13/23 0043  NA 135  --   --  132* 132* 131*  K 3.5  --   --  3.5 3.6 3.7  CL 104  --   --  102 102 103  CO2 23  --   --  18* 20* 21*  GLUCOSE 127*  --   --  92 105* 104*  BUN 5*  --   --  6 10 7   CREATININE 0.86  --   --  0.76 0.78 0.70  CALCIUM 8.9  --   --  8.3* 8.4* 8.5*  AST 15  --   --  12* 13* 22  ALT 12  --   --  7 11 16   ALKPHOS 66  --   --  58 53 54  BILITOT 0.5  --   --  0.7 0.4 0.4  ALBUMIN 3.3*  --   --  2.7* 2.6* 2.3*  MG  --   --   --   --  2.0 2.1  CRP  --   --   --  19.4*  --   --   DDIMER  --   --  9.02*  --   --   --   BNP  --  10.5  --   --   --   --       ------------------------------------------------------------------------------------------------------------------ No results for input(s): "CHOL", "HDL", "LDLCALC", "TRIG", "CHOLHDL", "LDLDIRECT" in the last 72 hours.  No results found for: "HGBA1C" ------------------------------------------------------------------------------------------------------------------ No results for input(s): "TSH", "T4TOTAL", "T3FREE", "THYROIDAB" in the last 72 hours.  Invalid input(s): "FREET3"  Cardiac Enzymes No results for input(s): "CKMB", "TROPONINI", "MYOGLOBIN" in the last 168 hours.  Invalid input(s): "CK" ------------------------------------------------------------------------------------------------------------------    Component Value Date/Time   BNP 10.5 03/09/2023 1534    CBG: No results for input(s): "GLUCAP" in the last 168 hours.  Recent Results (from the past 240 hour(s))  Resp panel by RT-PCR (RSV, Flu A&B, Covid) Anterior Nasal Swab     Status: None   Collection Time: 03/11/23  7:09 AM   Specimen: Anterior Nasal Swab  Result Value Ref Range Status   SARS Coronavirus 2 by RT PCR NEGATIVE NEGATIVE Final   Influenza A by PCR NEGATIVE NEGATIVE Final   Influenza B by PCR NEGATIVE NEGATIVE Final    Comment: (NOTE) The Xpert Xpress SARS-CoV-2/FLU/RSV plus assay is intended as an aid in the diagnosis of influenza from Nasopharyngeal swab specimens and should not be used as a sole basis for treatment. Nasal washings and aspirates are unacceptable for Xpert Xpress SARS-CoV-2/FLU/RSV testing.  Fact Sheet for Patients: EntrepreneurPulse.com.au  Fact Sheet for Healthcare Providers: IncredibleEmployment.be  This test is not yet approved or cleared by the [place] FDA and has been authorized for detection and/or diagnosis of SARS-CoV-2 by FDA under an Emergency Use Authorization (EUA). This EUA will remain in effect (meaning this test can  be used) for the duration of the COVID-19 declaration under Section 564(b)(1) of the Act, 21  U.S.C. section 360bbb-3(b)(1), unless the authorization is terminated or revoked.     Resp Syncytial Virus by PCR NEGATIVE NEGATIVE Final    Comment: (NOTE) Fact Sheet for Patients: EntrepreneurPulse.com.au  Fact Sheet for Healthcare Providers: IncredibleEmployment.be  This test is not yet approved or cleared by the [place] FDA and has been authorized for detection and/or diagnosis of SARS-CoV-2 by FDA under an Emergency Use Authorization (EUA). This EUA will remain in effect (meaning this test can be used) for the duration of the COVID-19 declaration under Section 564(b)(1) of the Act, 21 U.S.C. section 360bbb-3(b)(1), unless the authorization is terminated or revoked.  Performed at [facility], [place] [address] [place], [place] [address]   Culture, blood (Routine X 2) w Reflex to ID Panel     Status: None (Preliminary result)   Collection Time: 03/11/23  7:50 AM   Specimen: BLOOD  Result Value Ref Range Status   Specimen Description BLOOD RIGHT ANTECUBITAL  Final   Special Requests   Final    BOTTLES DRAWN AEROBIC AND ANAEROBIC Blood Culture adequate volume   Culture   Final    NO GROWTH 2 DAYS Performed at [facility], [place] [address], [place], [place] [address]    Report Status PENDING  Incomplete  Culture, blood (Routine X 2) w Reflex to ID Panel     Status: None (Preliminary result)   Collection Time: 03/11/23 11:30 AM   Specimen: BLOOD RIGHT ARM  Result Value Ref Range Status   Specimen Description BLOOD RIGHT ARM  Final   Special Requests   Final    BOTTLES DRAWN AEROBIC ONLY Blood Culture results may not be optimal due to an inadequate volume of blood received in culture bottles   Culture   Final    NO GROWTH 2 DAYS Performed at [facility], [place] [address], [place], [place] [address]    Report Status PENDING   Incomplete  MRSA Next Gen by PCR, Nasal     Status: None   Collection Time: 03/11/23  6:00 PM   Specimen: Nasal Mucosa; Nasal Swab  Result Value Ref Range Status   MRSA by PCR Next Gen NOT DETECTED NOT DETECTED Final    Comment: (NOTE) The GeneXpert MRSA Assay (FDA approved for NASAL specimens only), is one component of a comprehensive MRSA colonization surveillance program. It is not intended to diagnose MRSA infection nor to guide or monitor treatment for MRSA infections. Test performance is not FDA approved in patients less than 42 years old. Performed at [facility], [place] [address], [place], [place] [address]      Radiology Studies: MR THORACIC SPINE W WO CONTRAST  Result Date: 03/12/2023 CLINICAL DATA:  Initial evaluation for mid and lower back pain, infection suspected. EXAM: MRI THORACIC AND LUMBAR SPINE WITHOUT AND WITH CONTRAST TECHNIQUE: Multiplanar and multiecho pulse sequences of the thoracic and lumbar spine were obtained without and with intravenous contrast. CONTRAST:  21mL GADAVIST GADOBUTROL 1 MMOL/ML IV SOLN COMPARISON:  Prior MRIs from 03/11/2023. FINDINGS: MRI THORACIC SPINE FINDINGS Alignment: Physiologic with preservation of the normal thoracic kyphosis. No listhesis. Vertebrae: Vertebral body height maintained without acute or chronic fracture. Bone marrow signal intensity mildly heterogeneous but overall within normal limits. No worrisome osseous lesions. No evidence for osteomyelitis discitis or septic arthritis. Cord: Normal signal and morphology. No abnormal enhancement. No epidural abscess or other collection. Paraspinal and other soft tissues: No paraspinous inflammatory changes or collections. Small layering bilateral  pleural effusions, right larger than left. Disc levels: Mild for age multilevel degenerative spondylosis noted throughout the mid thoracic spine. No significant spinal stenosis. Foramina remain patent. No neural impingement. MRI LUMBAR SPINE  FINDINGS Segmentation: Standard. Lowest well-formed disc space labeled the L5-S1 level. Alignment: Physiologic with preservation of the normal lumbar lordosis. No listhesis. Vertebrae: Vertebral body height maintained without acute or chronic fracture. Bone marrow signal intensity heterogeneous but overall within normal limits. No discrete or worrisome osseous lesions. No evidence for osteomyelitis discitis or septic arthritis. Conus medullaris: Extends to the L1 level and appears normal. No abnormal enhancement. No epidural abscess or other collection. Paraspinal and other soft tissues: No paraspinous inflammatory changes or collections. No other acute finding. Disc levels: L1-2: Negative interspace. Mild facet hypertrophy. No canal or foraminal stenosis. L2-3: Disc desiccation with mild disc bulge, slightly eccentric to the left. Mild left greater than right facet hypertrophy. No significant spinal stenosis. Foramina remain patent. L3-4: Mild degenerative intervertebral disc space narrowing with diffuse disc bulge, slightly asymmetric to the left. Mild bilateral facet and ligament flavum hypertrophy. No significant spinal stenosis. Foramina remain patent. L4-5: Disc desiccation with mild diffuse disc bulge. Superimposed right foraminal disc protrusion contacts the exiting right L4 nerve root (series 13, image 6). Moderate right worse than left facet hypertrophy with associated trace joint effusions. Mild narrowing of the lateral recesses bilaterally. Central canal remains patent. Mild right greater than left L4 foraminal stenosis. L5-S1: Disc desiccation. Shallow broad-based central to left subarticular disc protrusion with annular fissure (series 14, image 38). Protruding disc closely approximates and/or contacts both of the descending S1 nerve roots as they course through the lateral recesses, greater on the left. Left S1 nerve root is slightly displaced posteriorly. Mild to moderate facet hypertrophy. No  significant spinal stenosis. Foramina remain patent. IMPRESSION: MRI THORACIC SPINE IMPRESSION: 1. No evidence for acute infection within the thoracic spine. 2. Small layering bilateral pleural effusions, right larger than left. MRI LUMBAR SPINE IMPRESSION: 1. No evidence for acute infection within the lumbar spine. 2. Shallow central to left subarticular disc protrusion at L5-S1, potentially affecting either of the descending S1 nerve roots, greater on the left. 3. Right foraminal disc protrusion at L4-5, contacting the exiting right L4 nerve root. 4. Mild-to-moderate multilevel facet hypertrophy, most pronounced at L4-5 on the right. Electronically Signed   By: Jeannine Boga M.D.   On: 03/12/2023 19:33   MR Lumbar Spine W Wo Contrast  Result Date: 03/12/2023 CLINICAL DATA:  Initial evaluation for mid and lower back pain, infection suspected. EXAM: MRI THORACIC AND LUMBAR SPINE WITHOUT AND WITH CONTRAST TECHNIQUE: Multiplanar and multiecho pulse sequences of the thoracic and lumbar spine were obtained without and with intravenous contrast. CONTRAST:  67mL GADAVIST GADOBUTROL 1 MMOL/ML IV SOLN COMPARISON:  Prior MRIs from 03/11/2023. FINDINGS: MRI THORACIC SPINE FINDINGS Alignment: Physiologic with preservation of the normal thoracic kyphosis. No listhesis. Vertebrae: Vertebral body height maintained without acute or chronic fracture. Bone marrow signal intensity mildly heterogeneous but overall within normal limits. No worrisome osseous lesions. No evidence for osteomyelitis discitis or septic arthritis. Cord: Normal signal and morphology. No abnormal enhancement. No epidural abscess or other collection. Paraspinal and other soft tissues: No paraspinous inflammatory changes or collections. Small layering bilateral pleural effusions, right larger than left. Disc levels: Mild for age multilevel degenerative spondylosis noted throughout the mid thoracic spine. No significant spinal stenosis. Foramina remain  patent. No neural impingement. MRI LUMBAR SPINE FINDINGS Segmentation: Standard. Lowest well-formed disc space  labeled the L5-S1 level. Alignment: Physiologic with preservation of the normal lumbar lordosis. No listhesis. Vertebrae: Vertebral body height maintained without acute or chronic fracture. Bone marrow signal intensity heterogeneous but overall within normal limits. No discrete or worrisome osseous lesions. No evidence for osteomyelitis discitis or septic arthritis. Conus medullaris: Extends to the L1 level and appears normal. No abnormal enhancement. No epidural abscess or other collection. Paraspinal and other soft tissues: No paraspinous inflammatory changes or collections. No other acute finding. Disc levels: L1-2: Negative interspace. Mild facet hypertrophy. No canal or foraminal stenosis. L2-3: Disc desiccation with mild disc bulge, slightly eccentric to the left. Mild left greater than right facet hypertrophy. No significant spinal stenosis. Foramina remain patent. L3-4: Mild degenerative intervertebral disc space narrowing with diffuse disc bulge, slightly asymmetric to the left. Mild bilateral facet and ligament flavum hypertrophy. No significant spinal stenosis. Foramina remain patent. L4-5: Disc desiccation with mild diffuse disc bulge. Superimposed right foraminal disc protrusion contacts the exiting right L4 nerve root (series 13, image 6). Moderate right worse than left facet hypertrophy with associated trace joint effusions. Mild narrowing of the lateral recesses bilaterally. Central canal remains patent. Mild right greater than left L4 foraminal stenosis. L5-S1: Disc desiccation. Shallow broad-based central to left subarticular disc protrusion with annular fissure (series 14, image 38). Protruding disc closely approximates and/or contacts both of the descending S1 nerve roots as they course through the lateral recesses, greater on the left. Left S1 nerve root is slightly displaced  posteriorly. Mild to moderate facet hypertrophy. No significant spinal stenosis. Foramina remain patent. IMPRESSION: MRI THORACIC SPINE IMPRESSION: 1. No evidence for acute infection within the thoracic spine. 2. Small layering bilateral pleural effusions, right larger than left. MRI LUMBAR SPINE IMPRESSION: 1. No evidence for acute infection within the lumbar spine. 2. Shallow central to left subarticular disc protrusion at L5-S1, potentially affecting either of the descending S1 nerve roots, greater on the left. 3. Right foraminal disc protrusion at L4-5, contacting the exiting right L4 nerve root. 4. Mild-to-moderate multilevel facet hypertrophy, most pronounced at L4-5 on the right. Electronically Signed   By: Jeannine Boga M.D.   On: 03/12/2023 19:33   EEG adult  Result Date: 03/12/2023 Lora Havens, MD     03/12/2023  4:47 PM Patient Name: Stefan Blehm MRN: [id number] Epilepsy Attending: Lora Havens Referring Physician/Provider: Caren Griffins, MD Date: 318/2024 Duration: 23.31 mins Patient history:  52 y.o. woman s/p resection of large meningioma w/ post-op pseudomeningocele, here for abdominal / chest pain w/ PE. MRI brain personally reviewed, which shows left sided pseudomeningocele. EEG to evaluate for seizure. Level of alertness: Awake AEDs during EEG study: LEV, GBP, TPM Technical aspects: This EEG study was done with scalp electrodes positioned according to the 10-20 International system of electrode placement. Electrical activity was reviewed with band pass filter of 1-70Hz , sensitivity of 7 uV/mm, display speed of 2mm/sec with a 60Hz  notched filter applied as appropriate. EEG data were recorded continuously and digitally stored.  Video monitoring was available and reviewed as appropriate. Description: The posterior dominant rhythm consists of 9 Hz activity of moderate voltage (25-35 uV) seen predominantly in posterior head regions, symmetric and reactive to eye opening  and eye closing. Drowsiness was characterized by attenuation of the posterior background rhythm. Physiologic photic driving was not seen during photic stimulation.  Hyperventilation was not performed.   IMPRESSION: This study is within normal limits. No seizures or epileptiform discharges were seen throughout the recording. A  normal interictal EEG does not exclude the diagnosis of epilepsy. Priyanka Mont Dutton, MD, PhD [facility]  Between 7 am - 7 pm I am available, please contact me via Amion (for emergencies) or Securechat (non urgent messages)  Between 7 pm - 7 am I am not available, please contact night coverage MD/APP via Amion

## 2023-03-14 ENCOUNTER — Ambulatory Visit: Payer: Medicaid Other | Admitting: Radiation Oncology

## 2023-03-14 ENCOUNTER — Ambulatory Visit: Payer: Medicaid Other

## 2023-03-14 DIAGNOSIS — I2699 Other pulmonary embolism without acute cor pulmonale: Secondary | ICD-10-CM | POA: Diagnosis not present

## 2023-03-14 DIAGNOSIS — R509 Fever, unspecified: Secondary | ICD-10-CM | POA: Diagnosis not present

## 2023-03-14 DIAGNOSIS — S06320D Contusion and laceration of left cerebrum without loss of consciousness, subsequent encounter: Secondary | ICD-10-CM | POA: Diagnosis not present

## 2023-03-14 DIAGNOSIS — S0190XD Unspecified open wound of unspecified part of head, subsequent encounter: Secondary | ICD-10-CM | POA: Diagnosis not present

## 2023-03-14 LAB — CBC
HCT: 25.5 % — ABNORMAL LOW (ref 36.0–46.0)
Hemoglobin: 8.2 g/dL — ABNORMAL LOW (ref 12.0–15.0)
MCH: 25.9 pg — ABNORMAL LOW (ref 26.0–34.0)
MCHC: 32.2 g/dL (ref 30.0–36.0)
MCV: 80.4 fL (ref 80.0–100.0)
Platelets: 331 10*3/uL (ref 150–400)
RBC: 3.17 MIL/uL — ABNORMAL LOW (ref 3.87–5.11)
RDW: 14.6 % (ref 11.5–15.5)
WBC: 6.7 10*3/uL (ref 4.0–10.5)
nRBC: 0 % (ref 0.0–0.2)

## 2023-03-14 LAB — BASIC METABOLIC PANEL
Anion gap: 8 (ref 5–15)
BUN: 6 mg/dL (ref 6–20)
CO2: 23 mmol/L (ref 22–32)
Calcium: 8.5 mg/dL — ABNORMAL LOW (ref 8.9–10.3)
Chloride: 102 mmol/L (ref 98–111)
Creatinine, Ser: 0.72 mg/dL (ref 0.44–1.00)
GFR, Estimated: >60 mL/min (ref >60)
Glucose, Bld: 94 mg/dL (ref 70–99)
Potassium: 3.8 mmol/L (ref 3.5–5.1)
Sodium: 133 mmol/L — ABNORMAL LOW (ref 135–145)

## 2023-03-14 LAB — MAGNESIUM: Magnesium: 2.2 mg/dL (ref 1.7–2.4)

## 2023-03-14 MED ORDER — ENSURE ENLIVE PO LIQD
237.0000 mL | Freq: Three times a day (TID) | ORAL | Status: DC
  Administered 2023-03-14 – 2023-03-18 (×6): 237 mL via ORAL

## 2023-03-14 MED ORDER — ADULT MULTIVITAMIN W/MINERALS CH
1.0000 | ORAL_TABLET | Freq: Two times a day (BID) | ORAL | Status: DC
  Administered 2023-03-15 – 2023-03-18 (×7): 1 via ORAL
  Filled 2023-03-14 (×8): qty 1

## 2023-03-14 MED ORDER — CALCIUM CARBONATE ANTACID 500 MG PO CHEW
200.0000 mg | CHEWABLE_TABLET | Freq: Three times a day (TID) | ORAL | Status: DC
  Administered 2023-03-15 – 2023-03-18 (×10): 200 mg via ORAL
  Filled 2023-03-14 (×10): qty 1

## 2023-03-14 NOTE — Progress Notes (Signed)
[facility] PROGRESS NOTE    Progress Note  Evelyn [name]  [id number] DOB: Sep 09, 1971 DOA: 03/09/2023 PCP: Loyola Mast, PA-C     Brief Narrative:   Evelyn [name] is an 52 y.o. 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  Assessment/Plan:   Pulmonary embolism on left [facility]) Placed on Xarelto which she has been tolerating. Not tachycardic satting greater 96% on room air.  Fever of unknown source: SARS-CoV-2 RSV and influenza PCR were negative. MRI of the brain showed no acute findings. ID and neurosurgery was consulted blood cultures were drawn on 03/11/2023 they have remained negative till date. Discussed with ID recommended to hold antibiotics and monitor for 48 hours if cultures remain negative and she remains afebrile she can probably be discharged 48 hours.  Right-sided chest pain/abdominal pain: CT scan of the abdomen pelvis showed no acute findings. Pain is controlled with tramadol and lidocaine patch.  Atypical intracranial meningioma: Status post surgery on 02/08/2023. She is planned to start radiation therapy with Dr. Baird Kay but it was postponed until next week due to her fevers. Continue Keppra and Topamax.  Normocytic anemia: Hemoglobin is stable.  Adjustment disorder with anxiety and depression: Continue Cymbalta.  Obstructive sleep apnea:  has not been using her CPAP at home.   DVT prophylaxis: lovenox Family Communication:none Status is: Inpatient Remains inpatient appropriate because: New PE.    Code  Status:     Code Status Orders  (From admission, onward)           Start     Ordered   03/09/23 2101  Full code  Continuous       Question:  By:  Answer:  Consent: discussion documented in EHR   03/09/23 2101           Code Status History     Date Active Date Inactive Code Status Order ID Comments User Context   02/15/2023 [id number] 02/26/2023 1646 Full Code [id number]  Ortencia Kick Inpatient   02/15/2023 1741 02/15/2023 1741 Full Code [id number]  Ortencia Kick Inpatient   02/08/2023 1545 02/15/2023 1736 Full Code [id number]  Collene Schlichter, PA-C Inpatient      Advance Directive Documentation    Flowsheet Row Most Recent Value  Type of Advance Directive Healthcare Power of Attorney, Living will  Pre-existing out of facility DNR order (yellow form or pink MOST form) --  "MOST" Form in Place? --         IV Access:   Peripheral IV   Procedures and diagnostic studies:   MR THORACIC SPINE W WO CONTRAST  Result Date: 03/12/2023 CLINICAL DATA:  Initial evaluation for mid and lower back pain, infection suspected. EXAM: MRI THORACIC AND LUMBAR SPINE WITHOUT AND WITH CONTRAST TECHNIQUE: Multiplanar and multiecho pulse sequences of the thoracic and lumbar spine were obtained without and with intravenous contrast. CONTRAST:  19mL GADAVIST GADOBUTROL 1 MMOL/ML IV SOLN COMPARISON:  Prior MRIs from 03/11/2023. FINDINGS: MRI THORACIC SPINE FINDINGS Alignment: Physiologic with preservation of the normal thoracic kyphosis. No listhesis. Vertebrae: Vertebral body height maintained without acute or chronic  fracture. Bone marrow signal intensity mildly heterogeneous but overall within normal limits. No worrisome osseous lesions. No evidence for osteomyelitis discitis or septic arthritis. Cord: Normal signal and morphology. No abnormal enhancement. No epidural abscess or other collection. Paraspinal and other soft tissues: No paraspinous inflammatory changes or collections.  Small layering bilateral pleural effusions, right larger than left. Disc levels: Mild for age multilevel degenerative spondylosis noted throughout the mid thoracic spine. No significant spinal stenosis. Foramina remain patent. No neural impingement. MRI LUMBAR SPINE FINDINGS Segmentation: Standard. Lowest well-formed disc space labeled the L5-S1 level. Alignment: Physiologic with preservation of the normal lumbar lordosis. No listhesis. Vertebrae: Vertebral body height maintained without acute or chronic fracture. Bone marrow signal intensity heterogeneous but overall within normal limits. No discrete or worrisome osseous lesions. No evidence for osteomyelitis discitis or septic arthritis. Conus medullaris: Extends to the L1 level and appears normal. No abnormal enhancement. No epidural abscess or other collection. Paraspinal and other soft tissues: No paraspinous inflammatory changes or collections. No other acute finding. Disc levels: L1-2: Negative interspace. Mild facet hypertrophy. No canal or foraminal stenosis. L2-3: Disc desiccation with mild disc bulge, slightly eccentric to the left. Mild left greater than right facet hypertrophy. No significant spinal stenosis. Foramina remain patent. L3-4: Mild degenerative intervertebral disc space narrowing with diffuse disc bulge, slightly asymmetric to the left. Mild bilateral facet and ligament flavum hypertrophy. No significant spinal stenosis. Foramina remain patent. L4-5: Disc desiccation with mild diffuse disc bulge. Superimposed right foraminal disc protrusion contacts the exiting right L4 nerve root (series 13, image 6). Moderate right worse than left facet hypertrophy with associated trace joint effusions. Mild narrowing of the lateral recesses bilaterally. Central canal remains patent. Mild right greater than left L4 foraminal stenosis. L5-S1: Disc desiccation. Shallow broad-based central to left subarticular disc protrusion with annular fissure (series 14,  image 38). Protruding disc closely approximates and/or contacts both of the descending S1 nerve roots as they course through the lateral recesses, greater on the left. Left S1 nerve root is slightly displaced posteriorly. Mild to moderate facet hypertrophy. No significant spinal stenosis. Foramina remain patent. IMPRESSION: MRI THORACIC SPINE IMPRESSION: 1. No evidence for acute infection within the thoracic spine. 2. Small layering bilateral pleural effusions, right larger than left. MRI LUMBAR SPINE IMPRESSION: 1. No evidence for acute infection within the lumbar spine. 2. Shallow central to left subarticular disc protrusion at L5-S1, potentially affecting either of the descending S1 nerve roots, greater on the left. 3. Right foraminal disc protrusion at L4-5, contacting the exiting right L4 nerve root. 4. Mild-to-moderate multilevel facet hypertrophy, most pronounced at L4-5 on the right. Electronically Signed   By: Jeannine Boga M.D.   On: 03/12/2023 19:33   MR Lumbar Spine W Wo Contrast  Result Date: 03/12/2023 CLINICAL DATA:  Initial evaluation for mid and lower back pain, infection suspected. EXAM: MRI THORACIC AND LUMBAR SPINE WITHOUT AND WITH CONTRAST TECHNIQUE: Multiplanar and multiecho pulse sequences of the thoracic and lumbar spine were obtained without and with intravenous contrast. CONTRAST:  57mL GADAVIST GADOBUTROL 1 MMOL/ML IV SOLN COMPARISON:  Prior MRIs from 03/11/2023. FINDINGS: MRI THORACIC SPINE FINDINGS Alignment: Physiologic with preservation of the normal thoracic kyphosis. No listhesis. Vertebrae: Vertebral body height maintained without acute or chronic fracture. Bone marrow signal intensity mildly heterogeneous but overall within normal limits. No worrisome osseous lesions. No evidence for osteomyelitis discitis or septic arthritis. Cord: Normal signal and morphology. No abnormal enhancement. No epidural abscess or other collection. Paraspinal and other  soft tissues: No  paraspinous inflammatory changes or collections. Small layering bilateral pleural effusions, right larger than left. Disc levels: Mild for age multilevel degenerative spondylosis noted throughout the mid thoracic spine. No significant spinal stenosis. Foramina remain patent. No neural impingement. MRI LUMBAR SPINE FINDINGS Segmentation: Standard. Lowest well-formed disc space labeled the L5-S1 level. Alignment: Physiologic with preservation of the normal lumbar lordosis. No listhesis. Vertebrae: Vertebral body height maintained without acute or chronic fracture. Bone marrow signal intensity heterogeneous but overall within normal limits. No discrete or worrisome osseous lesions. No evidence for osteomyelitis discitis or septic arthritis. Conus medullaris: Extends to the L1 level and appears normal. No abnormal enhancement. No epidural abscess or other collection. Paraspinal and other soft tissues: No paraspinous inflammatory changes or collections. No other acute finding. Disc levels: L1-2: Negative interspace. Mild facet hypertrophy. No canal or foraminal stenosis. L2-3: Disc desiccation with mild disc bulge, slightly eccentric to the left. Mild left greater than right facet hypertrophy. No significant spinal stenosis. Foramina remain patent. L3-4: Mild degenerative intervertebral disc space narrowing with diffuse disc bulge, slightly asymmetric to the left. Mild bilateral facet and ligament flavum hypertrophy. No significant spinal stenosis. Foramina remain patent. L4-5: Disc desiccation with mild diffuse disc bulge. Superimposed right foraminal disc protrusion contacts the exiting right L4 nerve root (series 13, image 6). Moderate right worse than left facet hypertrophy with associated trace joint effusions. Mild narrowing of the lateral recesses bilaterally. Central canal remains patent. Mild right greater than left L4 foraminal stenosis. L5-S1: Disc desiccation. Shallow broad-based central to left subarticular  disc protrusion with annular fissure (series 14, image 38). Protruding disc closely approximates and/or contacts both of the descending S1 nerve roots as they course through the lateral recesses, greater on the left. Left S1 nerve root is slightly displaced posteriorly. Mild to moderate facet hypertrophy. No significant spinal stenosis. Foramina remain patent. IMPRESSION: MRI THORACIC SPINE IMPRESSION: 1. No evidence for acute infection within the thoracic spine. 2. Small layering bilateral pleural effusions, right larger than left. MRI LUMBAR SPINE IMPRESSION: 1. No evidence for acute infection within the lumbar spine. 2. Shallow central to left subarticular disc protrusion at L5-S1, potentially affecting either of the descending S1 nerve roots, greater on the left. 3. Right foraminal disc protrusion at L4-5, contacting the exiting right L4 nerve root. 4. Mild-to-moderate multilevel facet hypertrophy, most pronounced at L4-5 on the right. Electronically Signed   By: Jeannine Boga M.D.   On: 03/12/2023 19:33   EEG adult  Result Date: 03/12/2023 Lora Havens, MD     03/12/2023  4:47 PM Patient Name: Evelyn [name] MRN: [id number] Epilepsy Attending: Lora Havens Referring Physician/Provider: Caren Griffins, MD Date: 318/2024 Duration: 23.31 mins Patient history:  52 y.o. woman s/p resection of large meningioma w/ post-op pseudomeningocele, here for abdominal / chest pain w/ PE. MRI brain personally reviewed, which shows left sided pseudomeningocele. EEG to evaluate for seizure. Level of alertness: Awake AEDs during EEG study: LEV, GBP, TPM Technical aspects: This EEG study was done with scalp electrodes positioned according to the 10-20 International system of electrode placement. Electrical activity was reviewed with band pass filter of 1-70Hz , sensitivity of 7 uV/mm, display speed of 64mm/sec with a 60Hz  notched filter applied as appropriate. EEG data were recorded continuously and  digitally stored.  Video monitoring was available and reviewed as appropriate. Description: The posterior dominant rhythm consists of 9 Hz activity of moderate voltage (25-35 uV) seen predominantly in posterior head regions, symmetric  and reactive to eye opening and eye closing. Drowsiness was characterized by attenuation of the posterior background rhythm. Physiologic photic driving was not seen during photic stimulation.  Hyperventilation was not performed.   IMPRESSION: This study is within normal limits. No seizures or epileptiform discharges were seen throughout the recording. A normal interictal EEG does not exclude the diagnosis of epilepsy. [place]:   None.   Subjective:    Evelyn [name] is no difficulty pain is controlled having bowel movement tolerating her diet  Objective:    Vitals:   03/13/23 0920 03/13/23 1551 03/13/23 2110 03/14/23 0336  BP: 120/79 114/67 108/62 111/71  Pulse: 99 96 (!) 102 100  Resp: 18 18 18 17   Temp: 100.2 F (37.9 C) 100 F (37.8 C) 100.2 F (37.9 C) 100.3 F (37.9 C)  TempSrc: Oral Oral Oral Oral  SpO2: 100% 95% 96% 98%  Weight:      Height:       SpO2: 98 % O2 Flow Rate (L/min): 2 L/min   Intake/Output Summary (Last 24 hours) at 03/14/2023 0839 Last data filed at 03/13/2023 2000 Gross per 24 hour  Intake 480 ml  Output --  Net 480 ml   Filed Weights   03/09/23 1502  Weight: 127 kg    Exam: General exam: In no acute distress. Respiratory system: Good air movement and clear to auscultation. Cardiovascular system: S1 & S2 heard, RRR. No JVD. Gastrointestinal system: Abdomen is nondistended, soft and nontender.  Extremities: No pedal edema. Skin: No rashes, lesions or ulcers Psychiatry: Judgement and insight appear normal. Mood & affect appropriate.    Data Reviewed:    Labs: Basic Metabolic Panel: Recent Labs  Lab 03/09/23 1511 03/10/23 0555 03/12/23 0320 03/13/23 0043  03/14/23 0122  NA 135 132* 132* 131* 133*  K 3.5 3.5 3.6 3.7 3.8  CL 104 102 102 103 102  CO2 23 18* 20* 21* 23  GLUCOSE 127* 92 105* 104* 94  BUN 5* 6 10 7 6   CREATININE 0.86 0.76 0.78 0.70 0.72  CALCIUM 8.9 8.3* 8.4* 8.5* 8.5*  MG  --   --  2.0 2.1 2.2   GFR Estimated Creatinine Clearance: 112.7 mL/min (by C-G formula based on SCr of 0.72 mg/dL). Liver Function Tests: Recent Labs  Lab 03/09/23 1511 03/10/23 0555 03/12/23 0320 03/13/23 0043  AST 15 12* 13* 22  ALT 12 7 11 16   ALKPHOS 66 58 53 54  BILITOT 0.5 0.7 0.4 0.4  PROT 7.1 6.1* 6.5 6.2*  ALBUMIN 3.3* 2.7* 2.6* 2.3*   Recent Labs  Lab 03/09/23 1511  LIPASE 35   No results for input(s): "AMMONIA" in the last 168 hours. Coagulation profile No results for input(s): "INR", "PROTIME" in the last 168 hours. COVID-19 Labs  No results for input(s): "DDIMER", "FERRITIN", "LDH", "CRP" in the last 72 hours.  Lab Results  Component Value Date   [place] NEGATIVE 03/11/2023   [place] Not Detected 10/10/2019    CBC: Recent Labs  Lab 03/09/23 1511 03/10/23 0555 03/12/23 0320 03/13/23 0043 03/14/23 0122  WBC 8.1 8.0 6.8 6.3 6.7  HGB 9.4* 8.3* 8.8* 7.9* 8.2*  HCT 30.2* 27.1* 27.0* 23.8* 25.5*  MCV 83.4 84.7 80.8 79.6* 80.4  PLT 244 235 311 309 331   Cardiac Enzymes: No results for input(s): "CKTOTAL", "CKMB", "CKMBINDEX", "TROPONINI" in the last 168 hours. BNP (last 3 results) No results for input(s): "PROBNP" in the last 8760 hours. CBG:  No results for input(s): "GLUCAP" in the last 168 hours. D-Dimer: No results for input(s): "DDIMER" in the last 72 hours. Hgb A1c: No results for input(s): "HGBA1C" in the last 72 hours. Lipid Profile: No results for input(s): "CHOL", "HDL", "LDLCALC", "TRIG", "CHOLHDL", "LDLDIRECT" in the last 72 hours. Thyroid function studies: No results for input(s): "TSH", "T4TOTAL", "T3FREE", "THYROIDAB" in the last 72 hours.  Invalid input(s): "FREET3" Anemia work  up: No results for input(s): "VITAMINB12", "FOLATE", "FERRITIN", "TIBC", "IRON", "RETICCTPCT" in the last 72 hours. Sepsis Labs: Recent Labs  Lab 03/10/23 0555 03/12/23 0320 03/13/23 0043 03/14/23 0122  WBC 8.0 6.8 6.3 6.7   Microbiology Recent Results (from the past 240 hour(s))  Resp panel by RT-PCR (RSV, Flu A&B, Covid) Anterior Nasal Swab     Status: None   Collection Time: 03/11/23  7:09 AM   Specimen: Anterior Nasal Swab  Result Value Ref Range Status   SARS Coronavirus 2 by RT PCR NEGATIVE NEGATIVE Final   Influenza A by PCR NEGATIVE NEGATIVE Final   Influenza B by PCR NEGATIVE NEGATIVE Final    Comment: (NOTE) The Xpert Xpress SARS-CoV-2/FLU/RSV plus assay is intended as an aid in the diagnosis of influenza from Nasopharyngeal swab specimens and should not be used as a sole basis for treatment. Nasal washings and aspirates are unacceptable for Xpert Xpress SARS-CoV-2/FLU/RSV testing.  Fact Sheet for Patients: EntrepreneurPulse.com.au  Fact Sheet for Healthcare Providers: IncredibleEmployment.be  This test is not yet approved or cleared by the [place] FDA and has been authorized for detection and/or diagnosis of SARS-CoV-2 by FDA under an Emergency Use Authorization (EUA). This EUA will remain in effect (meaning this test can be used) for the duration of the COVID-19 declaration under Section 564(b)(1) of the Act, 21 U.S.C. section 360bbb-3(b)(1), unless the authorization is terminated or revoked.     Resp Syncytial Virus by PCR NEGATIVE NEGATIVE Final    Comment: (NOTE) Fact Sheet for Patients: EntrepreneurPulse.com.au  Fact Sheet for Healthcare Providers: IncredibleEmployment.be  This test is not yet approved or cleared by the [place] FDA and has been authorized for detection and/or diagnosis of SARS-CoV-2 by FDA under an Emergency Use Authorization (EUA). This EUA will  remain in effect (meaning this test can be used) for the duration of the COVID-19 declaration under Section 564(b)(1) of the Act, 21 U.S.C. section 360bbb-3(b)(1), unless the authorization is terminated or revoked.  Performed at [facility], [place] [address] [place], [place] [address]   Culture, blood (Routine X 2) w Reflex to ID Panel     Status: None (Preliminary result)   Collection Time: 03/11/23  7:50 AM   Specimen: BLOOD  Result Value Ref Range Status   Specimen Description BLOOD RIGHT ANTECUBITAL  Final   Special Requests   Final    BOTTLES DRAWN AEROBIC AND ANAEROBIC Blood Culture adequate volume   Culture   Final    NO GROWTH 3 DAYS Performed at [facility], [place] [address] [place], [place] [address]    Report Status PENDING  Incomplete  Culture, blood (Routine X 2) w Reflex to ID Panel     Status: None (Preliminary result)   Collection Time: 03/11/23 11:30 AM   Specimen: BLOOD RIGHT ARM  Result Value Ref Range Status   Specimen Description BLOOD RIGHT ARM  Final   Special Requests   Final    BOTTLES DRAWN AEROBIC ONLY Blood Culture results may not be optimal due to an inadequate volume of  blood received in culture bottles   Culture   Final    NO GROWTH 3 DAYS Performed at [facility], [place] [address], [place], [place] [address]    Report Status PENDING  Incomplete  MRSA Next Gen by PCR, Nasal     Status: None   Collection Time: 03/11/23  6:00 PM   Specimen: Nasal Mucosa; Nasal Swab  Result Value Ref Range Status   MRSA by PCR Next Gen NOT DETECTED NOT DETECTED Final    Comment: (NOTE) The GeneXpert MRSA Assay (FDA approved for NASAL specimens only), is one component of a comprehensive MRSA colonization surveillance program. It is not intended to diagnose MRSA infection nor to guide or monitor treatment for MRSA infections. Test performance is not FDA approved in patients less than 65 years old. Performed at [facility], [place] [address] [place], [place] [address]      Medications:    DULoxetine  30 mg Oral QHS   DULoxetine  60 mg Oral Daily   famotidine  20 mg Oral BID   gabapentin  200 mg Oral TID   levETIRAcetam  750 mg Oral BID   lidocaine  1 patch Transdermal Q24H   Rivaroxaban  15 mg Oral BID WC   Followed by   Derrill Memo ON 03/31/2023] rivaroxaban  20 mg Oral Q supper   sodium chloride flush  3 mL Intravenous Q12H   topiramate  50 mg Oral BID   Continuous Infusions:    LOS: 3 days   Charlynne Cousins  [facility]  03/14/2023, 8:39 AM

## 2023-03-14 NOTE — Progress Notes (Signed)
Initial Nutrition Assessment  DOCUMENTATION CODES:   Morbid obesity  INTERVENTION:  Continue regular diet as ordered Ensure Enlive po TID, each supplement provides 350 kcal and 20 grams of protein. MVI with minerals BID Calcium Carbonate 500mg  TID Reached out to case manager/TOC per pt's mom request to set up assistance with transportation to appointments  NUTRITION DIAGNOSIS:   Inadequate oral intake related to cancer and cancer related treatments, poor appetite as evidenced by per patient/family report, meal completion < 50%.  GOAL:   Patient will meet greater than or equal to 90% of their needs  MONITOR:   PO intake, Supplement acceptance, Labs, Weight trends  REASON FOR ASSESSMENT:   Malnutrition Screening Tool    ASSESSMENT:   Pt admitted with R sided chest discomfort, found to have pulmonary embolism. PMH significant for atypical meningioma s/p L craniotomy and tumor resection (02/08/2023) with plans for radiation, seizure disorder, iron deficiency anemia, s/p gastric bypass   Continues with low grade fevers.  S/p MRI- showing fluid collection, cannot r/o infection. ID following. Plans for fluid sampling, cell count differential, glucose, protein and culture tomorrow.   Noted plans to start radiation next week.   Pt sleeping at time of visit. Briefly awoke to shoulder tap but remained sleepy and fell back to sleep. She did state that her appetite has been poor since she was diagnosed in December with atypical meningioma. She has not been taking her vitamins as directed but was planning to restart.   Pt's mom at bedside provided nutrition history to the best of her knowledge. Pt had roux-en-y gastric bypass in 2018. She is uncertain whether she has been continuing to be followed by outpatient RD. She states that she is more of a "snacker" rather than eats meals. She has been eating a lot of fruit (no pineapple), she used to eat cereal for breakfast but has stopped  recently, she has been eating salads with grilled chicken and can tolerate the rosemary pork chop and chicken. She tries to avoid bread and starches.    Meal completions: 03/19: 10% breakfast, 0% dinner 03/20: 25% breakfast  We discussed addition of nutrition supplements, continuing most liberalized diet, and adding in vitamin regimen.   Pt's weight noted to decline from 2018 to March 2021. Per documented weight history, her weight is noted to have begun increasing since that time. Since February, following her craniectomy, her weight has been trending down from 133 kg to 127 kg this admission.   Medications: pepcid  Labs: sodium 133  NUTRITION - FOCUSED PHYSICAL EXAM:  Flowsheet Row Most Recent Value  Orbital Region No depletion  Upper Arm Region No depletion  Thoracic and Lumbar Region No depletion  Buccal Region No depletion  Temple Region No depletion  Clavicle Bone Region No depletion  Clavicle and Acromion Bone Region No depletion  Scapular Bone Region No depletion  Dorsal Hand No depletion  Patellar Region No depletion  Anterior Thigh Region No depletion  Posterior Calf Region No depletion  Hair Reviewed  Eyes Unable to assess  Mouth Unable to assess  Skin Reviewed  Nails Reviewed       Diet Order:   Diet Order             Diet regular Fluid consistency: Thin  Diet effective now                   EDUCATION NEEDS:   Education needs have been addressed  Skin:  Skin Assessment: Reviewed RN  Assessment  Last BM:  3/19  Height:   Ht Readings from Last 1 Encounters:  03/09/23 5\' 7"  (1.702 m)    Weight:   Wt Readings from Last 1 Encounters:  03/09/23 127 kg    Ideal Body Weight:  61.4 kg  BMI:  Body mass index is 43.85 kg/m.  Estimated Nutritional Needs:   Kcal:  1900-2100  Protein:  95-110g  Fluid:  >/=1.9L  Clayborne Dana, RDN, LDN Clinical Nutrition

## 2023-03-14 NOTE — Progress Notes (Signed)
Subjective:  Continue to have low-grade objective fevers and subjective chills as well as continued headaches though her headaches are less intense today than they were yesterday.   Antibiotics:  Anti-infectives (From admission, onward)    Start     Dose/Rate Route Frequency Ordered Stop   03/12/23 1400  vancomycin (VANCOREADY) IVPB 1250 mg/250 mL  Status:  Discontinued        1,250 mg 166.7 mL/hr over 90 Minutes Intravenous Every 12 hours 03/12/23 1256 03/13/23 1052   03/11/23 1845  vancomycin (VANCOREADY) IVPB 2000 mg/400 mL  Status:  Discontinued        2,000 mg 200 mL/hr over 120 Minutes Intravenous Every 24 hours 03/11/23 1755 03/12/23 1256   03/11/23 1845  ceFEPIme (MAXIPIME) 2 g in sodium chloride 0.9 % 100 mL IVPB  Status:  Discontinued        2 g 200 mL/hr over 30 Minutes Intravenous Every 8 hours 03/11/23 1755 03/13/23 1052       Medications: Scheduled Meds:  DULoxetine  30 mg Oral QHS   DULoxetine  60 mg Oral Daily   famotidine  20 mg Oral BID   gabapentin  200 mg Oral TID   levETIRAcetam  750 mg Oral BID   lidocaine  1 patch Transdermal Q24H   Rivaroxaban  15 mg Oral BID WC   Followed by   Derrill Memo ON 03/31/2023] rivaroxaban  20 mg Oral Q supper   sodium chloride flush  3 mL Intravenous Q12H   topiramate  50 mg Oral BID   Continuous Infusions: PRN Meds:.acetaminophen, butalbital-acetaminophen-caffeine, cyclobenzaprine, diphenhydrAMINE, gadobutrol, ibuprofen, ondansetron **OR** ondansetron (ZOFRAN) IV, mouth rinse, prochlorperazine, senna-docusate, simethicone, traMADol    Objective: Weight change:   Intake/Output Summary (Last 24 hours) at 03/14/2023 1424 Last data filed at 03/14/2023 0917 Gross per 24 hour  Intake 360 ml  Output --  Net 360 ml   Blood pressure 104/66, pulse 83, temperature 97.9 F (36.6 C), temperature source Oral, resp. rate 17, height 5\' 7"  (1.702 m), weight 127 kg, SpO2 96 %. Temp:  [97.9 F (36.6 C)-100.3 F (37.9 C)]  97.9 F (36.6 C) (03/20 0920) Pulse Rate:  [83-102] 83 (03/20 0920) Resp:  [17-18] 17 (03/20 0920) BP: (104-114)/(62-71) 104/66 (03/20 0920) SpO2:  [95 %-98 %] 96 % (03/20 0920)  Physical Exam: Physical Exam Constitutional:      General: She is not in acute distress.    Appearance: She is well-developed. She is not diaphoretic.  HENT:     Head: Atraumatic.     Right Ear: External ear normal.     Left Ear: External ear normal.     Mouth/Throat:     Pharynx: No oropharyngeal exudate.  Eyes:     General: No scleral icterus.    Conjunctiva/sclera: Conjunctivae normal.     Pupils: Pupils are equal, round, and reactive to light.  Cardiovascular:     Rate and Rhythm: Normal rate and regular rhythm.  Pulmonary:     Effort: Pulmonary effort is normal. No respiratory distress.     Breath sounds: No wheezing.  Abdominal:     General: There is no distension.  Musculoskeletal:        General: No tenderness. Normal range of motion.  Lymphadenopathy:     Cervical: No cervical adenopathy.  Skin:    General: Skin is warm and dry.     Coloration: Skin is not pale.     Findings: No erythema or rash.  Neurological:     General: No focal deficit present.     Mental Status: She is alert and oriented to person, place, and time.     Motor: No abnormal muscle tone.     Coordination: Coordination normal.  Psychiatric:        Mood and Affect: Mood normal.        Behavior: Behavior normal.        Thought Content: Thought content normal.        Judgment: Judgment normal.     Operative sites is stable  Here is picture from initial visit    CBC:    BMET Recent Labs    03/13/23 0043 03/14/23 0122  NA 131* 133*  K 3.7 3.8  CL 103 102  CO2 21* 23  GLUCOSE 104* 94  BUN 7 6  CREATININE 0.70 0.72  CALCIUM 8.5* 8.5*     Liver Panel  Recent Labs    03/12/23 0320 03/13/23 0043  PROT 6.5 6.2*  ALBUMIN 2.6* 2.3*  AST 13* 22  ALT 11 16  ALKPHOS 53 54  BILITOT 0.4 0.4        Sedimentation Rate No results for input(s): "ESRSEDRATE" in the last 72 hours. C-Reactive Protein No results for input(s): "CRP" in the last 72 hours.  Micro Results: Recent Results (from the past 720 hour(s))  Resp panel by RT-PCR (RSV, Flu A&B, Covid) Anterior Nasal Swab     Status: None   Collection Time: 03/11/23  7:09 AM   Specimen: Anterior Nasal Swab  Result Value Ref Range Status   SARS Coronavirus 2 by RT PCR NEGATIVE NEGATIVE Final   Influenza A by PCR NEGATIVE NEGATIVE Final   Influenza B by PCR NEGATIVE NEGATIVE Final    Comment: (NOTE) The Xpert Xpress SARS-CoV-2/FLU/RSV plus assay is intended as an aid in the diagnosis of influenza from Nasopharyngeal swab specimens and should not be used as a sole basis for treatment. Nasal washings and aspirates are unacceptable for Xpert Xpress SARS-CoV-2/FLU/RSV testing.  Fact Sheet for Patients: EntrepreneurPulse.com.au  Fact Sheet for Healthcare Providers: IncredibleEmployment.be  This test is not yet approved or cleared by the [place] FDA and has been authorized for detection and/or diagnosis of SARS-CoV-2 by FDA under an Emergency Use Authorization (EUA). This EUA will remain in effect (meaning this test can be used) for the duration of the COVID-19 declaration under Section 564(b)(1) of the Act, 21 U.S.C. section 360bbb-3(b)(1), unless the authorization is terminated or revoked.     Resp Syncytial Virus by PCR NEGATIVE NEGATIVE Final    Comment: (NOTE) Fact Sheet for Patients: EntrepreneurPulse.com.au  Fact Sheet for Healthcare Providers: IncredibleEmployment.be  This test is not yet approved or cleared by the [place] FDA and has been authorized for detection and/or diagnosis of SARS-CoV-2 by FDA under an Emergency Use Authorization (EUA). This EUA will remain in effect (meaning this test can be used) for the duration of  the COVID-19 declaration under Section 564(b)(1) of the Act, 21 U.S.C. section 360bbb-3(b)(1), unless the authorization is terminated or revoked.  Performed at [facility], [place] [address] [place], [place] [address]   Culture, blood (Routine X 2) w Reflex to ID Panel     Status: None (Preliminary result)   Collection Time: 03/11/23  7:50 AM   Specimen: BLOOD  Result Value Ref Range Status   Specimen Description BLOOD RIGHT ANTECUBITAL  Final   Special Requests   Final    BOTTLES DRAWN AEROBIC  AND ANAEROBIC Blood Culture adequate volume   Culture   Final    NO GROWTH 3 DAYS Performed at [facility], [place] [address] [place], [place] [address]    Report Status PENDING  Incomplete  Culture, blood (Routine X 2) w Reflex to ID Panel     Status: None (Preliminary result)   Collection Time: 03/11/23 11:30 AM   Specimen: BLOOD RIGHT ARM  Result Value Ref Range Status   Specimen Description BLOOD RIGHT ARM  Final   Special Requests   Final    BOTTLES DRAWN AEROBIC ONLY Blood Culture results may not be optimal due to an inadequate volume of blood received in culture bottles   Culture   Final    NO GROWTH 3 DAYS Performed at [facility], [place] [address], [place], [place] [address]    Report Status PENDING  Incomplete  MRSA Next Gen by PCR, Nasal     Status: None   Collection Time: 03/11/23  6:00 PM   Specimen: Nasal Mucosa; Nasal Swab  Result Value Ref Range Status   MRSA by PCR Next Gen NOT DETECTED NOT DETECTED Final    Comment: (NOTE) The GeneXpert MRSA Assay (FDA approved for NASAL specimens only), is one component of a comprehensive MRSA colonization surveillance program. It is not intended to diagnose MRSA infection nor to guide or monitor treatment for MRSA infections. Test performance is not FDA approved in patients less than 71 years old. Performed at [facility], [place] [address], [place], [place] [address]     Studies/Results: MR THORACIC SPINE  W WO CONTRAST  Result Date: 03/12/2023 CLINICAL DATA:  Initial evaluation for mid and lower back pain, infection suspected. EXAM: MRI THORACIC AND LUMBAR SPINE WITHOUT AND WITH CONTRAST TECHNIQUE: Multiplanar and multiecho pulse sequences of the thoracic and lumbar spine were obtained without and with intravenous contrast. CONTRAST:  8mL GADAVIST GADOBUTROL 1 MMOL/ML IV SOLN COMPARISON:  Prior MRIs from 03/11/2023. FINDINGS: MRI THORACIC SPINE FINDINGS Alignment: Physiologic with preservation of the normal thoracic kyphosis. No listhesis. Vertebrae: Vertebral body height maintained without acute or chronic fracture. Bone marrow signal intensity mildly heterogeneous but overall within normal limits. No worrisome osseous lesions. No evidence for osteomyelitis discitis or septic arthritis. Cord: Normal signal and morphology. No abnormal enhancement. No epidural abscess or other collection. Paraspinal and other soft tissues: No paraspinous inflammatory changes or collections. Small layering bilateral pleural effusions, right larger than left. Disc levels: Mild for age multilevel degenerative spondylosis noted throughout the mid thoracic spine. No significant spinal stenosis. Foramina remain patent. No neural impingement. MRI LUMBAR SPINE FINDINGS Segmentation: Standard. Lowest well-formed disc space labeled the L5-S1 level. Alignment: Physiologic with preservation of the normal lumbar lordosis. No listhesis. Vertebrae: Vertebral body height maintained without acute or chronic fracture. Bone marrow signal intensity heterogeneous but overall within normal limits. No discrete or worrisome osseous lesions. No evidence for osteomyelitis discitis or septic arthritis. Conus medullaris: Extends to the L1 level and appears normal. No abnormal enhancement. No epidural abscess or other collection. Paraspinal and other soft tissues: No paraspinous inflammatory changes or collections. No other acute finding. Disc levels: L1-2:  Negative interspace. Mild facet hypertrophy. No canal or foraminal stenosis. L2-3: Disc desiccation with mild disc bulge, slightly eccentric to the left. Mild left greater than right facet hypertrophy. No significant spinal stenosis. Foramina remain patent. L3-4: Mild degenerative intervertebral disc space narrowing with diffuse disc bulge, slightly asymmetric to the left. Mild bilateral facet and  ligament flavum hypertrophy. No significant spinal stenosis. Foramina remain patent. L4-5: Disc desiccation with mild diffuse disc bulge. Superimposed right foraminal disc protrusion contacts the exiting right L4 nerve root (series 13, image 6). Moderate right worse than left facet hypertrophy with associated trace joint effusions. Mild narrowing of the lateral recesses bilaterally. Central canal remains patent. Mild right greater than left L4 foraminal stenosis. L5-S1: Disc desiccation. Shallow broad-based central to left subarticular disc protrusion with annular fissure (series 14, image 38). Protruding disc closely approximates and/or contacts both of the descending S1 nerve roots as they course through the lateral recesses, greater on the left. Left S1 nerve root is slightly displaced posteriorly. Mild to moderate facet hypertrophy. No significant spinal stenosis. Foramina remain patent. IMPRESSION: MRI THORACIC SPINE IMPRESSION: 1. No evidence for acute infection within the thoracic spine. 2. Small layering bilateral pleural effusions, right larger than left. MRI LUMBAR SPINE IMPRESSION: 1. No evidence for acute infection within the lumbar spine. 2. Shallow central to left subarticular disc protrusion at L5-S1, potentially affecting either of the descending S1 nerve roots, greater on the left. 3. Right foraminal disc protrusion at L4-5, contacting the exiting right L4 nerve root. 4. Mild-to-moderate multilevel facet hypertrophy, most pronounced at L4-5 on the right. Electronically Signed   By: Jeannine Boga  M.D.   On: 03/12/2023 19:33   MR Lumbar Spine W Wo Contrast  Result Date: 03/12/2023 CLINICAL DATA:  Initial evaluation for mid and lower back pain, infection suspected. EXAM: MRI THORACIC AND LUMBAR SPINE WITHOUT AND WITH CONTRAST TECHNIQUE: Multiplanar and multiecho pulse sequences of the thoracic and lumbar spine were obtained without and with intravenous contrast. CONTRAST:  70mL GADAVIST GADOBUTROL 1 MMOL/ML IV SOLN COMPARISON:  Prior MRIs from 03/11/2023. FINDINGS: MRI THORACIC SPINE FINDINGS Alignment: Physiologic with preservation of the normal thoracic kyphosis. No listhesis. Vertebrae: Vertebral body height maintained without acute or chronic fracture. Bone marrow signal intensity mildly heterogeneous but overall within normal limits. No worrisome osseous lesions. No evidence for osteomyelitis discitis or septic arthritis. Cord: Normal signal and morphology. No abnormal enhancement. No epidural abscess or other collection. Paraspinal and other soft tissues: No paraspinous inflammatory changes or collections. Small layering bilateral pleural effusions, right larger than left. Disc levels: Mild for age multilevel degenerative spondylosis noted throughout the mid thoracic spine. No significant spinal stenosis. Foramina remain patent. No neural impingement. MRI LUMBAR SPINE FINDINGS Segmentation: Standard. Lowest well-formed disc space labeled the L5-S1 level. Alignment: Physiologic with preservation of the normal lumbar lordosis. No listhesis. Vertebrae: Vertebral body height maintained without acute or chronic fracture. Bone marrow signal intensity heterogeneous but overall within normal limits. No discrete or worrisome osseous lesions. No evidence for osteomyelitis discitis or septic arthritis. Conus medullaris: Extends to the L1 level and appears normal. No abnormal enhancement. No epidural abscess or other collection. Paraspinal and other soft tissues: No paraspinous inflammatory changes or  collections. No other acute finding. Disc levels: L1-2: Negative interspace. Mild facet hypertrophy. No canal or foraminal stenosis. L2-3: Disc desiccation with mild disc bulge, slightly eccentric to the left. Mild left greater than right facet hypertrophy. No significant spinal stenosis. Foramina remain patent. L3-4: Mild degenerative intervertebral disc space narrowing with diffuse disc bulge, slightly asymmetric to the left. Mild bilateral facet and ligament flavum hypertrophy. No significant spinal stenosis. Foramina remain patent. L4-5: Disc desiccation with mild diffuse disc bulge. Superimposed right foraminal disc protrusion contacts the exiting right L4 nerve root (series 13, image 6). Moderate right worse than left facet hypertrophy  with associated trace joint effusions. Mild narrowing of the lateral recesses bilaterally. Central canal remains patent. Mild right greater than left L4 foraminal stenosis. L5-S1: Disc desiccation. Shallow broad-based central to left subarticular disc protrusion with annular fissure (series 14, image 38). Protruding disc closely approximates and/or contacts both of the descending S1 nerve roots as they course through the lateral recesses, greater on the left. Left S1 nerve root is slightly displaced posteriorly. Mild to moderate facet hypertrophy. No significant spinal stenosis. Foramina remain patent. IMPRESSION: MRI THORACIC SPINE IMPRESSION: 1. No evidence for acute infection within the thoracic spine. 2. Small layering bilateral pleural effusions, right larger than left. MRI LUMBAR SPINE IMPRESSION: 1. No evidence for acute infection within the lumbar spine. 2. Shallow central to left subarticular disc protrusion at L5-S1, potentially affecting either of the descending S1 nerve roots, greater on the left. 3. Right foraminal disc protrusion at L4-5, contacting the exiting right L4 nerve root. 4. Mild-to-moderate multilevel facet hypertrophy, most pronounced at L4-5 on the  right. Electronically Signed   By: Jeannine Boga M.D.   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 female with newly diagnosed meningioma status post resection in February now admitted with symptoms of constipation flank pain found to have PE then had ongoing rigors and fevers despite being anticoagulated.  MRI of the brain was performed which shows a fluid collection that radiology is not a "slam dunk" for infection but obviously this is still possible  #1 FUO:  I continue to worry about the operative site being a source of infection  Blood clots certainly do cause fevers but not typically as high as her temperatures have been and certainly I do not typically see patients continue to have ongoing fevers.  Only if she has an infection of her postoperative site she will need surgical and antimicrobial management.  I discussed the case with Dr. Zada Finders and he is agreeable to sampling this fluid and sending for cell count differential, glucose and protein as well as culture tomorrow afternoon.    #2 PE: on   I spent 52 minutes with the patient including than 50% of the time in face to face counseling of the patient and her Mom re her FUO, postoperative fluid collection, PE  reviewing along with review of medical records in preparation for the visit and during the visit and in coordination of her care.    LOS: 3 days   Alcide Evener 03/14/2023, 2:24 PM

## 2023-03-15 ENCOUNTER — Ambulatory Visit: Payer: Medicaid Other

## 2023-03-15 DIAGNOSIS — R509 Fever, unspecified: Secondary | ICD-10-CM | POA: Diagnosis not present

## 2023-03-15 DIAGNOSIS — T888XXD Other specified complications of surgical and medical care, not elsewhere classified, subsequent encounter: Secondary | ICD-10-CM

## 2023-03-15 DIAGNOSIS — I2699 Other pulmonary embolism without acute cor pulmonale: Secondary | ICD-10-CM | POA: Diagnosis not present

## 2023-03-15 DIAGNOSIS — T888XXA Other specified complications of surgical and medical care, not elsewhere classified, initial encounter: Secondary | ICD-10-CM

## 2023-03-15 LAB — RESPIRATORY PANEL BY PCR

## 2023-03-15 LAB — CSF CELL COUNT WITH DIFFERENTIAL
Eosinophils, CSF: 2 % — ABNORMAL HIGH (ref 0–1)
Lymphs, CSF: 77 % (ref 40–80)
Monocyte-Macrophage-Spinal Fluid: 16 % (ref 15–45)
RBC Count, CSF: 335 /mm3 — ABNORMAL HIGH
Segmented Neutrophils-CSF: 5 % (ref 0–6)
Tube #: 1
WBC, CSF: 21 /mm3 (ref 0–5)

## 2023-03-15 LAB — PROTEIN AND GLUCOSE, CSF
Glucose, CSF: 51 mg/dL (ref 40–70)
Total  Protein, CSF: 117 mg/dL — ABNORMAL HIGH (ref 15–45)

## 2023-03-15 LAB — LACTATE DEHYDROGENASE: LDH: 177 U/L (ref 98–192)

## 2023-03-15 NOTE — Progress Notes (Signed)
Ongoing concern for fevers, given continued concern regarding the possibility of a post-op infection, we discussed tapping her fluid collection to send it for analysis. 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 Fluid was withdrawn, which appeared completely clear and placed into CSF tubes then sent for usual CSF studies.   I must stress that, given the stasis of the fluid in this area, it's not flowing / exchanging with her normal CSF and sitting in a static collection. Therefore, the glucose / protein / cell counts will certainly be abnormal, the glucose is metabolized while it's static, etc. I would not be concerned unless the gram stain / cultures are positive.

## 2023-03-15 NOTE — TOC CM/SW Note (Addendum)
Received a secure chat from RD, mother requesting transportation assistance to appointments.   NCM spoke with patient and mother at bedside. Patient stated she has a car and does not need transportation resources. NCM left  Medicaid transportation number and resources at bedside.  Medicaid Transportation number placed on AVS   Xarelto co pay for 30 days is $4.00   Transition of Care (TOC) Screening Note   Transition of Care Department (TOC) has reviewed patient and no TOC needs have been identified at this time. We will continue to monitor patient advancement through interdisciplinary progression rounds. If new patient transition needs arise, please place a TOC consult.

## 2023-03-15 NOTE — Progress Notes (Signed)
Subjective:  Last fever yesterday Dr Joaquim Nam aspirated the fluid collection around surgical site --> it appears clear (culture/cytology sent)  Mild cough and mild headache  Frequent visit from family members but no obvious sick contact  No other localized complaint  No hx dvt/lupus  Remains on anticoagulation for PE which is newly dx'ed this admission  No rash/joint pain    Antibiotics:  Anti-infectives (From admission, onward)    Start     Dose/Rate Route Frequency Ordered Stop   03/12/23 1400  vancomycin (VANCOREADY) IVPB 1250 mg/250 mL  Status:  Discontinued        1,250 mg 166.7 mL/hr over 90 Minutes Intravenous Every 12 hours 03/12/23 1256 03/13/23 1052   03/11/23 1845  vancomycin (VANCOREADY) IVPB 2000 mg/400 mL  Status:  Discontinued        2,000 mg 200 mL/hr over 120 Minutes Intravenous Every 24 hours 03/11/23 1755 03/12/23 1256   03/11/23 1845  ceFEPIme (MAXIPIME) 2 g in sodium chloride 0.9 % 100 mL IVPB  Status:  Discontinued        2 g 200 mL/hr over 30 Minutes Intravenous Every 8 hours 03/11/23 1755 03/13/23 1052       Medications: Scheduled Meds:  calcium carbonate  200 mg of elemental calcium Oral TID   DULoxetine  30 mg Oral QHS   DULoxetine  60 mg Oral Daily   famotidine  20 mg Oral BID   feeding supplement  237 mL Oral TID BM   gabapentin  200 mg Oral TID   levETIRAcetam  750 mg Oral BID   lidocaine  1 patch Transdermal Q24H   multivitamin with minerals  1 tablet Oral BID   Rivaroxaban  15 mg Oral BID WC   Followed by   Derrill Memo ON 03/31/2023] rivaroxaban  20 mg Oral Q supper   sodium chloride flush  3 mL Intravenous Q12H   topiramate  50 mg Oral BID   Continuous Infusions: PRN Meds:.acetaminophen, butalbital-acetaminophen-caffeine, cyclobenzaprine, diphenhydrAMINE, gadobutrol, ibuprofen, ondansetron **OR** ondansetron (ZOFRAN) IV, mouth rinse, prochlorperazine, senna-docusate, simethicone, traMADol    Objective: Weight change:    Intake/Output Summary (Last 24 hours) at 03/15/2023 1416 Last data filed at 03/15/2023 1100 Gross per 24 hour  Intake 480 ml  Output --  Net 480 ml    Blood pressure (!) 102/56, pulse 93, temperature 99 F (37.2 C), temperature source Oral, resp. rate 17, height 5\' 7"  (1.702 m), weight 127 kg, SpO2 97 %. Temp:  [99 F (37.2 C)-102.2 F (39 C)] 99 F (37.2 C) (03/21 1224) Pulse Rate:  [91-110] 93 (03/21 1224) Resp:  [17-18] 17 (03/21 1224) BP: (94-117)/(56-81) 102/56 (03/21 1224) SpO2:  [94 %-100 %] 97 % (03/21 1224)  Physical Exam: Physical Exam Constitutional:      General: She is not in acute distress.    Appearance: She is well-developed. She is not diaphoretic.  HENT:     Head: Atraumatic.     Right Ear: External ear normal.     Left Ear: External ear normal.     Mouth/Throat:     Pharynx: No oropharyngeal exudate.  Eyes:     General: No scleral icterus.    Conjunctiva/sclera: Conjunctivae normal.     Pupils: Pupils are equal, round, and reactive to light.  Cardiovascular:     Rate and Rhythm: Normal rate and regular rhythm.  Pulmonary:     Effort: Pulmonary effort is normal. No respiratory distress.  Breath sounds: No wheezing.  Abdominal:     General: There is no distension.  Musculoskeletal:        General: No tenderness. Normal range of motion.  Lymphadenopathy:     Cervical: No cervical adenopathy.  Skin:    General: Skin is warm and dry.     Coloration: Skin is not pale.     Findings: No erythema or rash.  Neurological:     General: No focal deficit present.     Mental Status: She is alert and oriented to person, place, and time.     Motor: No abnormal muscle tone.     Coordination: Coordination normal.  Psychiatric:        Mood and Affect: Mood normal.        Behavior: Behavior normal.        Thought Content: Thought content normal.        Judgment: Judgment normal.     Operative sites is stable There remains significant soft tissue  swelling around surgical site but nontender    CBC:    BMET Recent Labs    03/13/23 0043 03/14/23 0122  NA 131* 133*  K 3.7 3.8  CL 103 102  CO2 21* 23  GLUCOSE 104* 94  BUN 7 6  CREATININE 0.70 0.72  CALCIUM 8.5* 8.5*      Liver Panel  Recent Labs    03/13/23 0043  PROT 6.2*  ALBUMIN 2.3*  AST 22  ALT 16  ALKPHOS 54  BILITOT 0.4        Sedimentation Rate No results for input(s): "ESRSEDRATE" in the last 72 hours. C-Reactive Protein No results for input(s): "CRP" in the last 72 hours.  Micro Results: Recent Results (from the past 720 hour(s))  Resp panel by RT-PCR (RSV, Flu A&B, Covid) Anterior Nasal Swab     Status: None   Collection Time: 03/11/23  7:09 AM   Specimen: Anterior Nasal Swab  Result Value Ref Range Status   SARS Coronavirus 2 by RT PCR NEGATIVE NEGATIVE Final   Influenza A by PCR NEGATIVE NEGATIVE Final   Influenza B by PCR NEGATIVE NEGATIVE Final    Comment: (NOTE) The Xpert Xpress SARS-CoV-2/FLU/RSV plus assay is intended as an aid in the diagnosis of influenza from Nasopharyngeal swab specimens and should not be used as a sole basis for treatment. Nasal washings and aspirates are unacceptable for Xpert Xpress SARS-CoV-2/FLU/RSV testing.  Fact Sheet for Patients: EntrepreneurPulse.com.au  Fact Sheet for Healthcare Providers: IncredibleEmployment.be  This test is not yet approved or cleared by the [place] FDA and has been authorized for detection and/or diagnosis of SARS-CoV-2 by FDA under an Emergency Use Authorization (EUA). This EUA will remain in effect (meaning this test can be used) for the duration of the COVID-19 declaration under Section 564(b)(1) of the Act, 21 U.S.C. section 360bbb-3(b)(1), unless the authorization is terminated or revoked.     Resp Syncytial Virus by PCR NEGATIVE NEGATIVE Final    Comment: (NOTE) Fact Sheet for  Patients: EntrepreneurPulse.com.au  Fact Sheet for Healthcare Providers: IncredibleEmployment.be  This test is not yet approved or cleared by the [place] FDA and has been authorized for detection and/or diagnosis of SARS-CoV-2 by FDA under an Emergency Use Authorization (EUA). This EUA will remain in effect (meaning this test can be used) for the duration of the COVID-19 declaration under Section 564(b)(1) of the Act, 21 U.S.C. section 360bbb-3(b)(1), unless the authorization is terminated or revoked.  Performed at [facility], [place] [address], [place], [place] [address]   Culture, blood (Routine X 2) w Reflex to ID Panel     Status: None (Preliminary result)   Collection Time: 03/11/23  7:50 AM   Specimen: BLOOD  Result Value Ref Range Status   Specimen Description BLOOD RIGHT ANTECUBITAL  Final   Special Requests   Final    BOTTLES DRAWN AEROBIC AND ANAEROBIC Blood Culture adequate volume   Culture   Final    NO GROWTH 4 DAYS Performed at [facility], [place] [address] [place], [place] [address]    Report Status PENDING  Incomplete  Culture, blood (Routine X 2) w Reflex to ID Panel     Status: None (Preliminary result)   Collection Time: 03/11/23 11:30 AM   Specimen: BLOOD RIGHT ARM  Result Value Ref Range Status   Specimen Description BLOOD RIGHT ARM  Final   Special Requests   Final    BOTTLES DRAWN AEROBIC ONLY Blood Culture results may not be optimal due to an inadequate volume of blood received in culture bottles   Culture   Final    NO GROWTH 4 DAYS Performed at [facility], [place] [address], [place], [place] [address]    Report Status PENDING  Incomplete  MRSA Next Gen by PCR, Nasal     Status: None   Collection Time: 03/11/23  6:00 PM   Specimen: Nasal Mucosa; Nasal Swab  Result Value Ref Range Status   MRSA by PCR Next Gen NOT DETECTED NOT DETECTED Final    Comment: (NOTE) The GeneXpert MRSA Assay (FDA  approved for NASAL specimens only), is one component of a comprehensive MRSA colonization surveillance program. It is not intended to diagnose MRSA infection nor to guide or monitor treatment for MRSA infections. Test performance is not FDA approved in patients less than 26 years old. Performed at [facility], [place] [address] [place], [place] [address]   CSF culture w Gram Stain     Status: None (Preliminary result)   Collection Time: 03/15/23  7:17 AM   Specimen: CSF; Cerebrospinal Fluid  Result Value Ref Range Status   Specimen Description CSF  Final   Special Requests LP  Final   Gram Stain   Final    WBC PRESENT,BOTH PMN AND MONONUCLEAR NO ORGANISMS SEEN CYTOSPIN SMEAR Performed at [facility], [place] [address], [place],  [address]    Culture PENDING  Incomplete   Report Status PENDING  Incomplete    Studies/Results: No results found.    Assessment/Plan:  INTERVAL HISTORY: Patient continues to have low-grade temperatures off antibiotics   Principal Problem:   Pulmonary embolism on left [facility]) Active Problems:   OSA (obstructive sleep apnea)   Atypical intracranial meningioma (HCC)   Adjustment reaction with anxiety and depression   Abdominal pain   Normocytic anemia   Acute pulmonary embolism (HCC)   Hematoma of left cerebral hemisphere with open cranial wound (HCC)   FUO (fever of unknown origin)  Abx: 3/17-19 vanc/cefepime    Evelyn [name] is a 52 y.o. female with newly diagnosed meningioma status post resection in February now admitted with symptoms of constipation flank pain found to have PE then had ongoing rigors and fevers despite being anticoagulated.  MRI of the brain was performed which shows a fluid collection that radiology is not a "slam dunk" for infection but obviously this is still possible  #FUO: #?nosocomial onset fever (she actually  was discharged after surgery 2/15 but came back 3/15) She complained of allodynia  lower back and mri 3/18 was negative 3/15 admission xray chest right lung base opacity atelectasis vs pna -- minimal dry cough 3/17 mri brain with operative site fluid collection -- s/p tap today 3/21 by NSG and fluid appears clear Flu/covid pcr negative Bcx negative PE new dx this admission, on anticoagulation. Fever still day 5 from anticoagulation No other sign of suggestion of antiphospholipid process  Nothing to suggest drug fever    -f/u fluid analysis from surgical site aspiration: agree temporally most likely connection with fever is the surgical fluid collection although it appears rather clear. Case series of these craniotomy procedures with post op fever almost half of the time fever is unclear -send full respiratory pcr -it is a little early (seems she can tell when she have fever) but query if she has had fever at home without being awared, so will send ldh make sure it is not significantly high to suggest lymphoma (low on suspicion at this time though) -will continue to monitor for now before pursuing other diagnostics if fever continues -continues monitoring off abx  -discussed with primary team   #PE  I spent more than 50 minute reviewing data/chart, and coordinating care and >50% direct face to face time providing counseling/discussing diagnostics/treatment plan with patient     LOS: 4 days   Cleta Heatley T Lavoris Canizales 03/15/2023, 2:16 PM

## 2023-03-15 NOTE — Progress Notes (Signed)
[facility] PROGRESS NOTE    Progress Note  Evelyn [name]  [id number] DOB: 1971/06/04 DOA: 03/09/2023 PCP: Loyola Mast, PA-C     Brief Narrative:   Evelyn [name] is an 52 y.o. 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  Assessment/Plan:   Pulmonary embolism on left [facility]) Placed on Xarelto which she has been tolerating. Not tachycardic satting greater 96% on room air.  Fever of unknown source: ID and neurosurgery was consulted blood cultures were drawn on 03/11/2023 they have remained negative till date. Discussed with ID we are concerned operative site being a source of infection. The case was discussed with neurosurgery who was agreed to sampling the fluid sending for cell count glucose protein as well as culture today.  Right-sided chest pain/abdominal pain: CT scan of the abdomen pelvis showed no acute findings. Pain is controlled with tramadol and lidocaine patch.  Atypical intracranial meningioma: Status post surgery on 02/08/2023. She is planned to start radiation therapy with Dr. Baird Kay but it was postponed until next week due to her fevers. Continue Keppra and Topamax.  Normocytic anemia: Hemoglobin is stable.  Adjustment disorder with anxiety and depression: Continue Cymbalta.  Obstructive sleep apnea:  has not been using her CPAP at home.   DVT prophylaxis: lovenox Family Communication:none Status is: Inpatient Remains inpatient appropriate because: New PE.    Code Status:     Code Status Orders   (From admission, onward)           Start     Ordered   03/09/23 2101  Full code  Continuous       Question:  By:  Answer:  Consent: discussion documented in EHR   03/09/23 2101           Code Status History     Date Active Date Inactive Code Status Order ID Comments User Context   02/15/2023 1741 02/26/2023 1646 Full Code [id number]  Ortencia Kick Inpatient   02/15/2023 1741 02/15/2023 1741 Full Code [id number]  Ortencia Kick Inpatient   02/08/2023 1545 02/15/2023 1736 Full Code [id number]  Collene Schlichter, PA-C Inpatient      Advance Directive Documentation    Flowsheet Row Most Recent Value  Type of Advance Directive Healthcare Power of Attorney, Living will  Pre-existing out of facility DNR order (yellow form or pink MOST form) --  "MOST" Form in Place? --         IV Access:   Peripheral IV   Procedures and diagnostic studies:   No results found.   Medical Consultants:   None.   Subjective:    Evelyn [name] anorexic no complaints.  Objective:    Vitals:   03/14/23 2345 03/15/23 0357 03/15/23 0517 03/15/23 0850  BP: 114/70 111/74 106/67 97/69  Pulse: 94 (!) 101 100 91  Resp: 18 18  17   Temp: 100 F (37.8 C) (!) 101.1 F (38.4 C) 99.7 F (37.6 C) 99.6 F (37.6 C)  TempSrc: Oral Oral Oral Oral  SpO2: 100% 99% 98% 97%  Weight:      Height:  SpO2: 97 % O2 Flow Rate (L/min): 2 L/min   Intake/Output Summary (Last 24 hours) at 03/15/2023 0854 Last data filed at 03/14/2023 1500 Gross per 24 hour  Intake 480 ml  Output --  Net 480 ml    Filed Weights   03/09/23 1502  Weight: 127 kg    Exam: General exam: In no acute distress. Respiratory system: Good air movement and clear to auscultation. Cardiovascular system: S1 & S2 heard, RRR. No JVD. Gastrointestinal system: Abdomen is nondistended, soft and nontender.  Extremities: No pedal edema. Skin: No rashes, lesions or ulcers Psychiatry: Judgement and  insight appear normal. Mood & affect appropriate. Data Reviewed:    Labs: Basic Metabolic Panel: Recent Labs  Lab 03/09/23 1511 03/10/23 0555 03/12/23 0320 03/13/23 0043 03/14/23 0122  NA 135 132* 132* 131* 133*  K 3.5 3.5 3.6 3.7 3.8  CL 104 102 102 103 102  CO2 23 18* 20* 21* 23  GLUCOSE 127* 92 105* 104* 94  BUN 5* 6 10 7 6   CREATININE 0.86 0.76 0.78 0.70 0.72  CALCIUM 8.9 8.3* 8.4* 8.5* 8.5*  MG  --   --  2.0 2.1 2.2    GFR Estimated Creatinine Clearance: 112.7 mL/min (by C-G formula based on SCr of 0.72 mg/dL). Liver Function Tests: Recent Labs  Lab 03/09/23 1511 03/10/23 0555 03/12/23 0320 03/13/23 0043  AST 15 12* 13* 22  ALT 12 7 11 16   ALKPHOS 66 58 53 54  BILITOT 0.5 0.7 0.4 0.4  PROT 7.1 6.1* 6.5 6.2*  ALBUMIN 3.3* 2.7* 2.6* 2.3*    Recent Labs  Lab 03/09/23 1511  LIPASE 35    No results for input(s): "AMMONIA" in the last 168 hours. Coagulation profile No results for input(s): "INR", "PROTIME" in the last 168 hours. COVID-19 Labs  No results for input(s): "DDIMER", "FERRITIN", "LDH", "CRP" in the last 72 hours.  Lab Results  Component Value Date   [place] NEGATIVE 03/11/2023   [place] Not Detected 10/10/2019    CBC: Recent Labs  Lab 03/09/23 1511 03/10/23 0555 03/12/23 0320 03/13/23 0043 03/14/23 0122  WBC 8.1 8.0 6.8 6.3 6.7  HGB 9.4* 8.3* 8.8* 7.9* 8.2*  HCT 30.2* 27.1* 27.0* 23.8* 25.5*  MCV 83.4 84.7 80.8 79.6* 80.4  PLT 244 235 311 309 331    Cardiac Enzymes: No results for input(s): "CKTOTAL", "CKMB", "CKMBINDEX", "TROPONINI" in the last 168 hours. BNP (last 3 results) No results for input(s): "PROBNP" in the last 8760 hours. CBG: No results for input(s): "GLUCAP" in the last 168 hours. D-Dimer: No results for input(s): "DDIMER" in the last 72 hours. Hgb A1c: No results for input(s): "HGBA1C" in the last 72 hours. Lipid Profile: No results for input(s): "CHOL", "HDL", "LDLCALC", "TRIG", "CHOLHDL",  "LDLDIRECT" in the last 72 hours. Thyroid function studies: No results for input(s): "TSH", "T4TOTAL", "T3FREE", "THYROIDAB" in the last 72 hours.  Invalid input(s): "FREET3" Anemia work up: No results for input(s): "VITAMINB12", "FOLATE", "FERRITIN", "TIBC", "IRON", "RETICCTPCT" in the last 72 hours. Sepsis Labs: Recent Labs  Lab 03/10/23 0555 03/12/23 0320 03/13/23 0043 03/14/23 0122  WBC 8.0 6.8 6.3 6.7    Microbiology Recent Results (from the past 240 hour(s))  Resp panel by RT-PCR (RSV, Flu A&B, Covid) Anterior Nasal Swab     Status: None   Collection Time: 03/11/23  7:09 AM   Specimen: Anterior Nasal Swab  Result Value Ref Range Status   SARS Coronavirus 2 by RT PCR NEGATIVE NEGATIVE Final   Influenza  A by PCR NEGATIVE NEGATIVE Final   Influenza B by PCR NEGATIVE NEGATIVE Final    Comment: (NOTE) The Xpert Xpress SARS-CoV-2/FLU/RSV plus assay is intended as an aid in the diagnosis of influenza from Nasopharyngeal swab specimens and should not be used as a sole basis for treatment. Nasal washings and aspirates are unacceptable for Xpert Xpress SARS-CoV-2/FLU/RSV testing.  Fact Sheet for Patients: EntrepreneurPulse.com.au  Fact Sheet for Healthcare Providers: IncredibleEmployment.be  This test is not yet approved or cleared by the [place] FDA and has been authorized for detection and/or diagnosis of SARS-CoV-2 by FDA under an Emergency Use Authorization (EUA). This EUA will remain in effect (meaning this test can be used) for the duration of the COVID-19 declaration under Section 564(b)(1) of the Act, 21 U.S.C. section 360bbb-3(b)(1), unless the authorization is terminated or revoked.     Resp Syncytial Virus by PCR NEGATIVE NEGATIVE Final    Comment: (NOTE) Fact Sheet for Patients: EntrepreneurPulse.com.au  Fact Sheet for Healthcare Providers: IncredibleEmployment.be  This test is  not yet approved or cleared by the [place] FDA and has been authorized for detection and/or diagnosis of SARS-CoV-2 by FDA under an Emergency Use Authorization (EUA). This EUA will remain in effect (meaning this test can be used) for the duration of the COVID-19 declaration under Section 564(b)(1) of the Act, 21 U.S.C. section 360bbb-3(b)(1), unless the authorization is terminated or revoked.  Performed at [facility], [place] [address], [place], [place] [address]   Culture, blood (Routine X 2) w Reflex to ID Panel     Status: None (Preliminary result)   Collection Time: 03/11/23  7:50 AM   Specimen: BLOOD  Result Value Ref Range Status   Specimen Description BLOOD RIGHT ANTECUBITAL  Final   Special Requests   Final    BOTTLES DRAWN AEROBIC AND ANAEROBIC Blood Culture adequate volume   Culture   Final    NO GROWTH 4 DAYS Performed at [facility], [place] [address], [place], [place] [address]    Report Status PENDING  Incomplete  Culture, blood (Routine X 2) w Reflex to ID Panel     Status: None (Preliminary result)   Collection Time: 03/11/23 11:30 AM   Specimen: BLOOD RIGHT ARM  Result Value Ref Range Status   Specimen Description BLOOD RIGHT ARM  Final   Special Requests   Final    BOTTLES DRAWN AEROBIC ONLY Blood Culture results may not be optimal due to an inadequate volume of blood received in culture bottles   Culture   Final    NO GROWTH 4 DAYS Performed at [facility], [place] [address] [place], [place] [address]    Report Status PENDING  Incomplete  MRSA Next Gen by PCR, Nasal     Status: None   Collection Time: 03/11/23  6:00 PM   Specimen: Nasal Mucosa; Nasal Swab  Result Value Ref Range Status   MRSA by PCR Next Gen NOT DETECTED NOT DETECTED Final    Comment: (NOTE) The GeneXpert MRSA Assay (FDA approved for NASAL specimens only), is one component of a comprehensive MRSA colonization surveillance program. It is not intended to diagnose MRSA  infection nor to guide or monitor treatment for MRSA infections. Test performance is not FDA approved in patients less than 54 years old. Performed at [facility], [place] [address] [place], [place] [address]      Medications:    calcium carbonate  200 mg of elemental calcium  Oral TID   DULoxetine  30 mg Oral QHS   DULoxetine  60 mg Oral Daily   famotidine  20 mg Oral BID   feeding supplement  237 mL Oral TID BM   gabapentin  200 mg Oral TID   levETIRAcetam  750 mg Oral BID   lidocaine  1 patch Transdermal Q24H   multivitamin with minerals  1 tablet Oral BID   Rivaroxaban  15 mg Oral BID WC   Followed by   Derrill Memo ON 03/31/2023] rivaroxaban  20 mg Oral Q supper   sodium chloride flush  3 mL Intravenous Q12H   topiramate  50 mg Oral BID   Continuous Infusions:    LOS: 4 days   Charlynne Cousins  [facility]  03/15/2023, 8:54 AM

## 2023-03-15 NOTE — Progress Notes (Signed)
CRITICAL VALUE STICKER  CRITICAL VALUE: CSF WBC 21  RECEIVER (on-site recipient of call): Earlyne Iba, RN  DATE & TIME NOTIFIED: 03/15/23 0210  MESSENGER (representative from lab): Donnal Moat  MD NOTIFIED: Dr. Aileen Fass and Dr. Zada Finders  TIME OF NOTIFICATION: [id number]  RESPONSE: Reviewed

## 2023-03-15 NOTE — Progress Notes (Signed)
MEWS Progress Note  Patient Details Name: Evelyn [name] MRN: [id number] DOB: 1971-12-05 Today's Date: 03/15/2023   MEWS Flowsheet Documentation:  Assess: MEWS Score Temp: (!) 101.7 F (38.7 C) BP: (!) 102/56 MAP (mmHg): 69 Pulse Rate: 93 ECG Heart Rate: 86 Resp: 17 Level of Consciousness: Alert SpO2: 97 % O2 Device: Room Air Patient Activity (if Appropriate): In bed O2 Flow Rate (L/min): 2 L/min Assess: MEWS Score MEWS Temp: 2 MEWS Systolic: 0 MEWS Pulse: 0 MEWS RR: 0 MEWS LOC: 0 MEWS Score: 2 MEWS Score Color: Yellow Assess: SIRS CRITERIA SIRS Temperature : 1 SIRS Respirations : 0 SIRS Pulse: 1 SIRS WBC: 0 SIRS Score Sum : 2 SIRS Temperature : 1 SIRS Pulse: 1 SIRS Respirations : 0 SIRS WBC: 0 SIRS Score Sum : 2 Assess: if the MEWS score is Yellow or Red Were vital signs taken at a resting state?: Yes Focused Assessment: Change from prior assessment (see assessment flowsheet) Does the patient meet 2 or more of the SIRS criteria?: Yes Does the patient have a confirmed or suspected source of infection?: Yes Provider and Rapid Response Notified?: Yes MEWS guidelines implemented : Yes, yellow Treat MEWS Interventions: Considered administering scheduled or prn medications/treatments as ordered Take Vital Signs Increase Vital Sign Frequency : Yellow: Q2hr x1, continue Q4hrs until patient remains green for 12hrs Escalate MEWS: Escalate: Yellow: Discuss with charge nurse and consider notifying provider and/or RRT Notify: Charge Nurse/RN Name of Charge Nurse/RN Notified: miriam Provider Notification Provider Name/Title: Aileen Fass, MD Date Provider Notified: 03/15/23 Time Provider Notified: [id number] Method of Notification: Page Notification Reason: Change in status Provider response: No new orders Date of Provider Response: 03/15/23 Time of Provider Response: 1645      Jamaal Bernasconi L Theresa Wedel 03/15/2023, 4:45 PM

## 2023-03-16 ENCOUNTER — Ambulatory Visit: Payer: Medicaid Other

## 2023-03-16 DIAGNOSIS — I2699 Other pulmonary embolism without acute cor pulmonale: Secondary | ICD-10-CM | POA: Diagnosis not present

## 2023-03-16 DIAGNOSIS — R509 Fever, unspecified: Secondary | ICD-10-CM | POA: Diagnosis not present

## 2023-03-16 LAB — CULTURE, BLOOD (ROUTINE X 2)
Culture: NO GROWTH
Culture: NO GROWTH
Special Requests: ADEQUATE

## 2023-03-16 MED ORDER — DM-GUAIFENESIN ER 30-600 MG PO TB12
1.0000 | ORAL_TABLET | Freq: Two times a day (BID) | ORAL | Status: DC
  Administered 2023-03-16 – 2023-03-18 (×4): 1 via ORAL
  Filled 2023-03-16 (×4): qty 1

## 2023-03-16 NOTE — Progress Notes (Signed)
Labs reviewed, CSF #s as expected again given that this is a static collection, gram stain and cultures reassuring. Clinically, her wound is well healed, the MRI and lab results are reassuring. I admittedly don't know where her fevers are coming from, but there is no MRI evidence of abscess or even early cerebritis, no subdural empyema, no evidence of osteomyelitis of her bone flap (which is typically not febrile in my experience), and we have a CSF sample that's reassuring, so I don't think we have evidence of a post-op infection at this point. If something should arise this weekend, please call the neurosurgeon on call to discuss, otherwise will see her next week for follow up.

## 2023-03-16 NOTE — Progress Notes (Signed)
MEWS Progress Note  Patient Details Name: Evelyn [name] MRN: [id number] DOB: [birth year] Today's Date: 03/16/2023   MEWS Flowsheet Documentation:  Assess: MEWS Score Temp: (!) 101.8 F (38.8 C) BP: 106/69 MAP (mmHg): 81 Pulse Rate: 100 ECG Heart Rate: 98 Resp: 17 Level of Consciousness: Alert SpO2: 97 % O2 Device: Room Air Patient Activity (if Appropriate): In bed O2 Flow Rate (L/min): 2 L/min Assess: MEWS Score MEWS Temp: 2 MEWS Systolic: 0 MEWS Pulse: 0 MEWS RR: 0 MEWS LOC: 0 MEWS Score: 2 MEWS Score Color: Yellow Assess: SIRS CRITERIA SIRS Temperature : 1 SIRS Respirations : 0 SIRS Pulse: 1 SIRS WBC: 0 SIRS Score Sum : 2 SIRS Temperature : 1 SIRS Pulse: 1 SIRS Respirations : 0 SIRS WBC: 0 SIRS Score Sum : 2 Assess: if the MEWS score is Yellow or Red Were vital signs taken at a resting state?: Yes Focused Assessment: No change from prior assessment Does the patient meet 2 or more of the SIRS criteria?: No Does the patient have a confirmed or suspected source of infection?: No Provider and Rapid Response Notified?: No MEWS guidelines implemented : Yes, yellow Treat MEWS Interventions: Considered administering scheduled or prn medications/treatments as ordered Take Vital Signs Increase Vital Sign Frequency : Yellow: Q2hr x1, continue Q4hrs until patient remains green for 12hrs Escalate MEWS: Escalate: Yellow: Discuss with charge nurse and consider notifying provider and/or RRT Notify: Charge Nurse/RN Name of Charge Nurse/RN Notified: miriam Provider Notification Provider Name/Title: Aileen Fass, MD Date Provider Notified: 03/15/23 Time Provider Notified: [id number] Method of Notification: Page Notification Reason: Change in status Provider response: No new orders Date of Provider Response: 03/15/23 Time of Provider Response: [id number]      Verdene Rio 03/16/2023, 6:03 AM

## 2023-03-16 NOTE — Progress Notes (Signed)
[facility] PROGRESS NOTE    Progress Note  Evelyn [name]  [id number] DOB: 06/15/1971 DOA: 03/09/2023 PCP: Loyola Mast, PA-C     Brief Narrative:   Evelyn [name] is an 52 y.o. 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  Assessment/Plan:   Pulmonary embolism on left [facility]) Placed on Xarelto which she has been tolerating. Not tachycardic satting greater 96% on room air.  Fever of unknown source: ID and neurosurgery was consulted blood cultures were drawn on 03/11/2023 they have remained negative till date. Tmax of 102.2. Neurosurgery performed aspirate of CSF.  Fluid sent for culture and Gram stain, neurosurgery relates to static CSF will certainly be abnormal from the glucose, as it is metabolized. ID recommended respiratory viral PCR negative LDH 77 He also recommended to continue to monitor off antibiotic. Continue Tylenol for fevers.  Right-sided chest pain/abdominal pain: CT scan of the abdomen pelvis showed no acute findings. Pain is controlled with tramadol and lidocaine patch.  Atypical intracranial meningioma: Status post surgery on 02/08/2023. She is planned to start radiation therapy with Dr. Baird Kay but it was postponed until next week due to her fevers. Continue Keppra and Topamax.  Normocytic anemia: Hemoglobin is stable.  Adjustment disorder with anxiety and depression: Continue Cymbalta.  Obstructive sleep apnea:  has not been using her CPAP at home.   DVT prophylaxis: lovenox Family  Communication:none Status is: Inpatient Remains inpatient appropriate because: New PE.    Code Status:     Code Status Orders  (From admission, onward)           Start     Ordered   03/09/23 2101  Full code  Continuous       Question:  By:  Answer:  Consent: discussion documented in EHR   03/09/23 2101           Code Status History     Date Active Date Inactive Code Status Order ID Comments User Context   02/15/2023 1741 02/26/2023 1646 Full Code [id number]  Ortencia Kick Inpatient   02/15/2023 1741 02/15/2023 1741 Full Code [id number]  Ortencia Kick Inpatient   02/08/2023 1545 02/15/2023 1736 Full Code [id number]  Collene Schlichter, PA-C Inpatient      Advance Directive Documentation    Flowsheet Row Most Recent Value  Type of Advance Directive Healthcare Power of Attorney, Living will  Pre-existing out of facility DNR order (yellow form or pink MOST form) --  "MOST" Form in Place? --         IV Access:   Peripheral IV   Procedures and diagnostic studies:   No results found.   Medical Consultants:   None.   Subjective:    Evelyn [name] continues to have fevers minimal appetite.  Objective:    Vitals:   03/16/23 0008 03/16/23 0113 03/16/23 0458 03/16/23 0825  BP: 111/65 111/65 106/69 (!) 104/59  Pulse: 100 100 100 (!) 102  Resp: 17 17    Temp: (!) 102.2 F (39 C) (!) 102.2 F (39 C) (!) 101.8 F (38.8 C) (!) 100.5 F (  38.1 C)  TempSrc:   Oral Oral  SpO2:   97% 96%  Weight:      Height:       SpO2: 96 % O2 Flow Rate (L/min): 2 L/min   Intake/Output Summary (Last 24 hours) at 03/16/2023 0836 Last data filed at 03/15/2023 2240 Gross per 24 hour  Intake 243 ml  Output --  Net 243 ml    Filed Weights   03/09/23 1502  Weight: 127 kg    Exam: General exam: In no acute distress. Respiratory system: Good air movement and clear to auscultation. Cardiovascular system: S1 & S2 heard, RRR. No  JVD. Gastrointestinal system: Abdomen is nondistended, soft and nontender.  Extremities: No pedal edema. Skin: No rashes, lesions or ulcers Psychiatry: Judgement and insight appear normal. Mood & affect appropriate. Data Reviewed:    Labs: Basic Metabolic Panel: Recent Labs  Lab 03/09/23 1511 03/10/23 0555 03/12/23 0320 03/13/23 0043 03/14/23 0122  NA 135 132* 132* 131* 133*  K 3.5 3.5 3.6 3.7 3.8  CL 104 102 102 103 102  CO2 23 18* 20* 21* 23  GLUCOSE 127* 92 105* 104* 94  BUN 5* 6 10 7 6   CREATININE 0.86 0.76 0.78 0.70 0.72  CALCIUM 8.9 8.3* 8.4* 8.5* 8.5*  MG  --   --  2.0 2.1 2.2    GFR Estimated Creatinine Clearance: 112.7 mL/min (by C-G formula based on SCr of 0.72 mg/dL). Liver Function Tests: Recent Labs  Lab 03/09/23 1511 03/10/23 0555 03/12/23 0320 03/13/23 0043  AST 15 12* 13* 22  ALT 12 7 11 16   ALKPHOS 66 58 53 54  BILITOT 0.5 0.7 0.4 0.4  PROT 7.1 6.1* 6.5 6.2*  ALBUMIN 3.3* 2.7* 2.6* 2.3*    Recent Labs  Lab 03/09/23 1511  LIPASE 35    No results for input(s): "AMMONIA" in the last 168 hours. Coagulation profile No results for input(s): "INR", "PROTIME" in the last 168 hours. COVID-19 Labs  Recent Labs    03/15/23 1025  LDH 177    Lab Results  Component Value Date   SARSCOV2NAA NEGATIVE 03/11/2023   [place] Not Detected 10/10/2019    CBC: Recent Labs  Lab 03/09/23 1511 03/10/23 0555 03/12/23 0320 03/13/23 0043 03/14/23 0122  WBC 8.1 8.0 6.8 6.3 6.7  HGB 9.4* 8.3* 8.8* 7.9* 8.2*  HCT 30.2* 27.1* 27.0* 23.8* 25.5*  MCV 83.4 84.7 80.8 79.6* 80.4  PLT 244 235 311 309 331    Cardiac Enzymes: No results for input(s): "CKTOTAL", "CKMB", "CKMBINDEX", "TROPONINI" in the last 168 hours. BNP (last 3 results) No results for input(s): "PROBNP" in the last 8760 hours. CBG: No results for input(s): "GLUCAP" in the last 168 hours. D-Dimer: No results for input(s): "DDIMER" in the last 72 hours. Hgb A1c: No results for  input(s): "HGBA1C" in the last 72 hours. Lipid Profile: No results for input(s): "CHOL", "HDL", "LDLCALC", "TRIG", "CHOLHDL", "LDLDIRECT" in the last 72 hours. Thyroid function studies: No results for input(s): "TSH", "T4TOTAL", "T3FREE", "THYROIDAB" in the last 72 hours.  Invalid input(s): "FREET3" Anemia work up: No results for input(s): "VITAMINB12", "FOLATE", "FERRITIN", "TIBC", "IRON", "RETICCTPCT" in the last 72 hours. Sepsis Labs: Recent Labs  Lab 03/10/23 0555 03/12/23 0320 03/13/23 0043 03/14/23 0122  WBC 8.0 6.8 6.3 6.7    Microbiology Recent Results (from the past 240 hour(s))  Resp panel by RT-PCR (RSV, Flu A&B, Covid) Anterior Nasal Swab     Status: None   Collection Time: 03/11/23  7:09  AM   Specimen: Anterior Nasal Swab  Result Value Ref Range Status   SARS Coronavirus 2 by RT PCR NEGATIVE NEGATIVE Final   Influenza A by PCR NEGATIVE NEGATIVE Final   Influenza B by PCR NEGATIVE NEGATIVE Final    Comment: (NOTE) The Xpert Xpress SARS-CoV-2/FLU/RSV plus assay is intended as an aid in the diagnosis of influenza from Nasopharyngeal swab specimens and should not be used as a sole basis for treatment. Nasal washings and aspirates are unacceptable for Xpert Xpress SARS-CoV-2/FLU/RSV testing.  Fact Sheet for Patients: EntrepreneurPulse.com.au  Fact Sheet for Healthcare Providers: IncredibleEmployment.be  This test is not yet approved or cleared by the [place] FDA and has been authorized for detection and/or diagnosis of SARS-CoV-2 by FDA under an Emergency Use Authorization (EUA). This EUA will remain in effect (meaning this test can be used) for the duration of the COVID-19 declaration under Section 564(b)(1) of the Act, 21 U.S.C. section 360bbb-3(b)(1), unless the authorization is terminated or revoked.     Resp Syncytial Virus by PCR NEGATIVE NEGATIVE Final    Comment: (NOTE) Fact Sheet for  Patients: EntrepreneurPulse.com.au  Fact Sheet for Healthcare Providers: IncredibleEmployment.be  This test is not yet approved or cleared by the [place] FDA and has been authorized for detection and/or diagnosis of SARS-CoV-2 by FDA under an Emergency Use Authorization (EUA). This EUA will remain in effect (meaning this test can be used) for the duration of the COVID-19 declaration under Section 564(b)(1) of the Act, 21 U.S.C. section 360bbb-3(b)(1), unless the authorization is terminated or revoked.  Performed at [facility], [place] [address] [place], [place] [address]   Culture, blood (Routine X 2) w Reflex to ID Panel     Status: None (Preliminary result)   Collection Time: 03/11/23  7:50 AM   Specimen: BLOOD  Result Value Ref Range Status   Specimen Description BLOOD RIGHT ANTECUBITAL  Final   Special Requests   Final    BOTTLES DRAWN AEROBIC AND ANAEROBIC Blood Culture adequate volume   Culture   Final    NO GROWTH 4 DAYS Performed at [facility], [place] [address] [place], [place] [address]    Report Status PENDING  Incomplete  Culture, blood (Routine X 2) w Reflex to ID Panel     Status: None (Preliminary result)   Collection Time: 03/11/23 11:30 AM   Specimen: BLOOD RIGHT ARM  Result Value Ref Range Status   Specimen Description BLOOD RIGHT ARM  Final   Special Requests   Final    BOTTLES DRAWN AEROBIC ONLY Blood Culture results may not be optimal due to an inadequate volume of blood received in culture bottles   Culture   Final    NO GROWTH 4 DAYS Performed at [facility], [place] [address], [place], [place] [address]    Report Status PENDING  Incomplete  MRSA Next Gen by PCR, Nasal     Status: None   Collection Time: 03/11/23  6:00 PM   Specimen: Nasal Mucosa; Nasal Swab  Result Value Ref Range Status   MRSA by PCR Next Gen NOT DETECTED NOT DETECTED Final    Comment: (NOTE) The GeneXpert MRSA Assay (FDA  approved for NASAL specimens only), is one component of a comprehensive MRSA colonization surveillance program. It is not intended to diagnose MRSA infection nor to guide or monitor treatment for MRSA infections. Test performance is not FDA approved in patients less than 42 years old. Performed at [facility], [place] [address] [place], [place] [address]   CSF culture w Gram Stain     Status: None (Preliminary result)   Collection Time: 03/15/23  7:17 AM   Specimen: CSF; Cerebrospinal Fluid  Result Value Ref Range Status   Specimen Description CSF  Final   Special Requests LP  Final   Gram Stain   Final    WBC PRESENT,BOTH PMN AND MONONUCLEAR NO ORGANISMS SEEN CYTOSPIN SMEAR Performed at [facility], [place] [address], [place], [place] [address]    Culture PENDING  Incomplete   Report Status PENDING  Incomplete  Respiratory (~20 pathogens) panel by PCR     Status: None   Collection Time: 03/15/23 10:55 AM   Specimen: Nasopharyngeal Swab; Respiratory  Result Value Ref Range Status   Adenovirus NOT DETECTED NOT DETECTED Final   Coronavirus 229E NOT DETECTED NOT DETECTED Final    Comment: (NOTE) The Coronavirus on the Respiratory Panel, DOES NOT test for the novel  Coronavirus (2019 nCoV)    Coronavirus HKU1 NOT DETECTED NOT DETECTED Final   Coronavirus NL63 NOT DETECTED NOT DETECTED Final   Coronavirus OC43 NOT DETECTED NOT DETECTED Final   Metapneumovirus NOT DETECTED NOT DETECTED Final   Rhinovirus / Enterovirus NOT DETECTED NOT DETECTED Final   Influenza A NOT DETECTED NOT DETECTED Final   Influenza B NOT DETECTED NOT DETECTED Final   Parainfluenza Virus 1 NOT DETECTED NOT DETECTED Final   Parainfluenza Virus 2 NOT DETECTED NOT DETECTED Final   Parainfluenza Virus 3 NOT DETECTED NOT DETECTED Final   Parainfluenza Virus 4 NOT DETECTED NOT DETECTED Final   Respiratory Syncytial Virus NOT DETECTED NOT DETECTED Final   Bordetella pertussis NOT DETECTED NOT DETECTED  Final   Bordetella Parapertussis NOT DETECTED NOT DETECTED Final   Chlamydophila pneumoniae NOT DETECTED NOT DETECTED Final   Mycoplasma pneumoniae NOT DETECTED NOT DETECTED Final    Comment: Performed at [facility], [place]. [address], [place], [place] [address]     Medications:    calcium carbonate  200 mg of elemental calcium Oral TID   DULoxetine  30 mg Oral QHS   DULoxetine  60 mg Oral Daily   famotidine  20 mg Oral BID   feeding supplement  237 mL Oral TID BM   gabapentin  200 mg Oral TID   levETIRAcetam  750 mg Oral BID   lidocaine  1 patch Transdermal Q24H   multivitamin with minerals  1 tablet Oral BID   Rivaroxaban  15 mg Oral BID WC   Followed by   Derrill Memo ON 03/31/2023] rivaroxaban  20 mg Oral Q supper   sodium chloride flush  3 mL Intravenous Q12H   topiramate  50 mg Oral BID   Continuous Infusions:    LOS: 5 days   Charlynne Cousins  [facility]  03/16/2023, 8:37 AM

## 2023-03-16 NOTE — Progress Notes (Signed)
MEWS Progress Note  Patient Details Name: Evelyn [name] MRN: [id number] DOB: 11-29-1971 Today's Date: 03/16/2023   MEWS Flowsheet Documentation:  Assess: MEWS Score Temp: (!) 102.2 F (39 C) BP: 111/65 MAP (mmHg): 80 Pulse Rate: 100 ECG Heart Rate: 86 Resp: 17 Level of Consciousness: Alert SpO2: 99 % O2 Device: Room Air Patient Activity (if Appropriate): In bed O2 Flow Rate (L/min): 2 L/min Assess: MEWS Score MEWS Temp: 2 MEWS Systolic: 0 MEWS Pulse: 0 MEWS RR: 0 MEWS LOC: 0 MEWS Score: 2 MEWS Score Color: Yellow Assess: SIRS CRITERIA SIRS Temperature : 1 SIRS Respirations : 0 SIRS Pulse: 1 SIRS WBC: 0 SIRS Score Sum : 2   Notify: Charge Nurse/RN Name of Charge Nurse/RN Notified: miriam Provider Notification Provider Name/Title: Aileen Fass, MD Date Provider Notified: 03/15/23 Time Provider Notified: [id number] Method of Notification: Page Notification Reason: Change in status Provider response: No new orders Date of Provider Response: 03/15/23 Time of Provider Response: [place] 03/16/2023, 12:27 AM

## 2023-03-16 NOTE — Progress Notes (Signed)
Subjective:  Continues to have fever Otherwise no other sx at all. Not complaining about cough today  Respiratory viral pcr negative Csf cx ngtd and gram stain no organism    Antibiotics:  Anti-infectives (From admission, onward)    Start     Dose/Rate Route Frequency Ordered Stop   03/12/23 1400  vancomycin (VANCOREADY) IVPB 1250 mg/250 mL  Status:  Discontinued        1,250 mg 166.7 mL/hr over 90 Minutes Intravenous Every 12 hours 03/12/23 1256 03/13/23 1052   03/11/23 1845  vancomycin (VANCOREADY) IVPB 2000 mg/400 mL  Status:  Discontinued        2,000 mg 200 mL/hr over 120 Minutes Intravenous Every 24 hours 03/11/23 1755 03/12/23 1256   03/11/23 1845  ceFEPIme (MAXIPIME) 2 g in sodium chloride 0.9 % 100 mL IVPB  Status:  Discontinued        2 g 200 mL/hr over 30 Minutes Intravenous Every 8 hours 03/11/23 1755 03/13/23 1052       Medications: Scheduled Meds:  calcium carbonate  200 mg of elemental calcium Oral TID   dextromethorphan-guaiFENesin  1 tablet Oral BID   DULoxetine  30 mg Oral QHS   DULoxetine  60 mg Oral Daily   famotidine  20 mg Oral BID   feeding supplement  237 mL Oral TID BM   gabapentin  200 mg Oral TID   levETIRAcetam  750 mg Oral BID   lidocaine  1 patch Transdermal Q24H   multivitamin with minerals  1 tablet Oral BID   Rivaroxaban  15 mg Oral BID WC   Followed by   Derrill Memo ON 03/31/2023] rivaroxaban  20 mg Oral Q supper   sodium chloride flush  3 mL Intravenous Q12H   topiramate  50 mg Oral BID   Continuous Infusions: PRN Meds:.acetaminophen, butalbital-acetaminophen-caffeine, cyclobenzaprine, diphenhydrAMINE, gadobutrol, ibuprofen, ondansetron **OR** ondansetron (ZOFRAN) IV, mouth rinse, prochlorperazine, senna-docusate, simethicone, traMADol    Objective: Weight change:   Intake/Output Summary (Last 24 hours) at 03/16/2023 1609 Last data filed at 03/15/2023 2240 Gross per 24 hour  Intake 3 ml  Output --  Net 3 ml    Blood  pressure 107/69, pulse 95, temperature 99.4 F (37.4 C), temperature source Oral, resp. rate 17, height 5\' 7"  (1.702 m), weight 127 kg, SpO2 97 %. Temp:  [99.4 F (37.4 C)-102.2 F (39 C)] 99.4 F (37.4 C) (03/22 1225) Pulse Rate:  [92-102] 95 (03/22 1225) Resp:  [17] 17 (03/22 1225) BP: (98-111)/(50-69) 107/69 (03/22 1225) SpO2:  [95 %-99 %] 97 % (03/22 1225)  Physical Exam: Physical Exam Constitutional:      General: She is not in acute distress.    Appearance: She is well-developed. She is not diaphoretic.  HENT:     Head: Atraumatic.     Right Ear: External ear normal.     Left Ear: External ear normal.     Mouth/Throat:     Pharynx: No oropharyngeal exudate.  Eyes:     General: No scleral icterus.    Conjunctiva/sclera: Conjunctivae normal.     Pupils: Pupils are equal, round, and reactive to light.  Cardiovascular:     Rate and Rhythm: Normal rate and regular rhythm.  Pulmonary:     Effort: Pulmonary effort is normal. No respiratory distress.     Breath sounds: No wheezing.  Abdominal:     General: There is no distension.  Musculoskeletal:        General:  No tenderness. Normal range of motion.  Lymphadenopathy:     Cervical: No cervical adenopathy.  Skin:    General: Skin is warm and dry.     Coloration: Skin is not pale.     Findings: No erythema or rash.  Neurological:     General: No focal deficit present.     Mental Status: She is alert and oriented to person, place, and time.     Motor: No abnormal muscle tone.     Coordination: Coordination normal.  Psychiatric:        Mood and Affect: Mood normal.        Behavior: Behavior normal.        Thought Content: Thought content normal.        Judgment: Judgment normal.     No change in operative site swelling -- no warmth or tenderness    CBC:    BMET Recent Labs    03/14/23 0122  NA 133*  K 3.8  CL 102  CO2 23  GLUCOSE 94  BUN 6  CREATININE 0.72  CALCIUM 8.5*      Liver Panel  No  results for input(s): "PROT", "ALBUMIN", "AST", "ALT", "ALKPHOS", "BILITOT", "BILIDIR", "IBILI" in the last 72 hours.      Sedimentation Rate No results for input(s): "ESRSEDRATE" in the last 72 hours. C-Reactive Protein No results for input(s): "CRP" in the last 72 hours.  Micro Results: Recent Results (from the past 720 hour(s))  Resp panel by RT-PCR (RSV, Flu A&B, Covid) Anterior Nasal Swab     Status: None   Collection Time: 03/11/23  7:09 AM   Specimen: Anterior Nasal Swab  Result Value Ref Range Status   SARS Coronavirus 2 by RT PCR NEGATIVE NEGATIVE Final   Influenza A by PCR NEGATIVE NEGATIVE Final   Influenza B by PCR NEGATIVE NEGATIVE Final    Comment: (NOTE) The Xpert Xpress SARS-CoV-2/FLU/RSV plus assay is intended as an aid in the diagnosis of influenza from Nasopharyngeal swab specimens and should not be used as a sole basis for treatment. Nasal washings and aspirates are unacceptable for Xpert Xpress SARS-CoV-2/FLU/RSV testing.  Fact Sheet for Patients: EntrepreneurPulse.com.au  Fact Sheet for Healthcare Providers: IncredibleEmployment.be  This test is not yet approved or cleared by the [place] FDA and has been authorized for detection and/or diagnosis of SARS-CoV-2 by FDA under an Emergency Use Authorization (EUA). This EUA will remain in effect (meaning this test can be used) for the duration of the COVID-19 declaration under Section 564(b)(1) of the Act, 21 U.S.C. section 360bbb-3(b)(1), unless the authorization is terminated or revoked.     Resp Syncytial Virus by PCR NEGATIVE NEGATIVE Final    Comment: (NOTE) Fact Sheet for Patients: EntrepreneurPulse.com.au  Fact Sheet for Healthcare Providers: IncredibleEmployment.be  This test is not yet approved or cleared by the [place] FDA and has been authorized for detection and/or diagnosis of SARS-CoV-2 by FDA under an  Emergency Use Authorization (EUA). This EUA will remain in effect (meaning this test can be used) for the duration of the COVID-19 declaration under Section 564(b)(1) of the Act, 21 U.S.C. section 360bbb-3(b)(1), unless the authorization is terminated or revoked.  Performed at [facility], [place] [address] [place], [place] [address]   Culture, blood (Routine X 2) w Reflex to ID Panel     Status: None   Collection Time: 03/11/23  7:50 AM   Specimen: BLOOD  Result Value Ref Range Status   Specimen Description  BLOOD RIGHT ANTECUBITAL  Final   Special Requests   Final    BOTTLES DRAWN AEROBIC AND ANAEROBIC Blood Culture adequate volume   Culture   Final    NO GROWTH 5 DAYS Performed at [facility], [address] [place], [place] [address]    Report Status 03/16/2023 FINAL  Final  Culture, blood (Routine X 2) w Reflex to ID Panel     Status: None   Collection Time: 03/11/23 11:30 AM   Specimen: BLOOD RIGHT ARM  Result Value Ref Range Status   Specimen Description BLOOD RIGHT ARM  Final   Special Requests   Final    BOTTLES DRAWN AEROBIC ONLY Blood Culture results may not be optimal due to an inadequate volume of blood received in culture bottles   Culture   Final    NO GROWTH 5 DAYS Performed at [facility], [place] [address] [place], [place] [address]    Report Status 03/16/2023 FINAL  Final  MRSA Next Gen by PCR, Nasal     Status: None   Collection Time: 03/11/23  6:00 PM   Specimen: Nasal Mucosa; Nasal Swab  Result Value Ref Range Status   MRSA by PCR Next Gen NOT DETECTED NOT DETECTED Final    Comment: (NOTE) The GeneXpert MRSA Assay (FDA approved for NASAL specimens only), is one component of a comprehensive MRSA colonization surveillance program. It is not intended to diagnose MRSA infection nor to guide or monitor treatment for MRSA infections. Test performance is not FDA approved in patients less than 47 years old. Performed at [place], [place] [address], [place], [place] [address]   CSF culture w Gram Stain     Status: None (Preliminary result)   Collection Time: 03/15/23  7:17 AM   Specimen: CSF; Cerebrospinal Fluid  Result Value Ref Range Status   Specimen Description CSF  Final   Special Requests LP  Final   Gram Stain   Final    WBC PRESENT,BOTH PMN AND MONONUCLEAR NO ORGANISMS SEEN CYTOSPIN SMEAR    Culture   Final    NO GROWTH 1 DAY Performed at [facility], [place] [address], [place], [place] [address]    Report Status PENDING  Incomplete  Respiratory (~20 pathogens) panel by PCR     Status: None   Collection Time: 03/15/23 10:55 AM   Specimen: Nasopharyngeal Swab; Respiratory  Result Value Ref Range Status   Adenovirus NOT DETECTED NOT DETECTED Final   Coronavirus 229E NOT DETECTED NOT DETECTED Final    Comment: (NOTE) The Coronavirus on the Respiratory Panel, DOES NOT test for the novel  Coronavirus (2019 nCoV)    Coronavirus HKU1 NOT DETECTED NOT DETECTED Final   Coronavirus NL63 NOT DETECTED NOT DETECTED Final   Coronavirus OC43 NOT DETECTED NOT DETECTED Final   Metapneumovirus NOT DETECTED NOT DETECTED Final   Rhinovirus / Enterovirus NOT DETECTED NOT DETECTED Final   Influenza A NOT DETECTED NOT DETECTED Final   Influenza B NOT DETECTED NOT DETECTED Final   Parainfluenza Virus 1 NOT DETECTED NOT DETECTED Final   Parainfluenza Virus 2 NOT DETECTED NOT DETECTED Final   Parainfluenza Virus 3 NOT DETECTED NOT DETECTED Final   Parainfluenza Virus 4 NOT DETECTED NOT DETECTED Final   Respiratory Syncytial Virus NOT DETECTED NOT DETECTED Final   Bordetella pertussis NOT DETECTED NOT DETECTED Final   Bordetella Parapertussis NOT DETECTED NOT DETECTED Final   Chlamydophila pneumoniae NOT DETECTED NOT DETECTED  Final   Mycoplasma pneumoniae NOT DETECTED NOT DETECTED Final    Comment: Performed at [facility], [place] [address], [place], [place] [address]    Studies/Results: No results  found.    Assessment/Plan:  INTERVAL HISTORY: Patient continues to have low-grade temperatures off antibiotics   Principal Problem:   Pulmonary embolism on left [facility]) Active Problems:   OSA (obstructive sleep apnea)   Atypical intracranial meningioma (HCC)   Adjustment reaction with anxiety and depression   Abdominal pain   Normocytic anemia   Acute pulmonary embolism (HCC)   Hematoma of left cerebral hemisphere with open cranial wound (HCC)   Fever   Fluid collection at surgical site  Abx: 3/17-19 vanc/cefepime    Evelyn [name] is a 52 y.o. female with newly diagnosed meningioma status post resection in February now admitted with symptoms of constipation flank pain found to have PE then had ongoing rigors and fevers despite being anticoagulated.  MRI of the brain was performed which shows a fluid collection that radiology is not a "slam dunk" for infection but obviously this is still possible  #FUO: #?nosocomial onset fever (she actually was discharged after surgery 2/15 but came back 3/15) She complained of allodynia lower back and mri 3/18 was negative 3/15 admission xray chest right lung base opacity atelectasis vs pna -- minimal dry cough 3/17 mri brain with operative site fluid collection -- s/p tap today 3/21 by NSG and fluid appears clear Flu/covid pcr negative Bcx negative PE new dx this admission, on anticoagulation. Fever still day 5 from anticoagulation No other sign of suggestion of antiphospholipid process  Nothing to suggest drug fever    -f/u fluid analysis from surgical site aspiration: agree temporally most likely connection with fever is the surgical fluid collection although it appears rather clear. Case series of these craniotomy procedures with post op fever almost half of the time fever is unclear -send full respiratory pcr -it is a little early (seems she can tell when she have fever) but query if she has had fever at home without being  awared, so will send ldh make sure it is not significantly high to suggest lymphoma (low on suspicion at this time though) -will continue to monitor for now before pursuing other diagnostics if fever continues -continues monitoring off abx  -discussed with primary team  ----------- 03/16/23 assessment Continued isolated fever Resp viral pcr negative Csf cx negative and doesn't appear infected  Ldh is only 177  Once csf cx finalize probably ok to discharge with id f/u Thinking inflammatory fever in nature and will give another week for it to do its course. If still persistent fever after that will pursue a full fuo workup  I have made an id clinic f/u for her on 4/9 @ 330pm. If she is not able to make in person visit, can call clinic to do video visit  #PE  I spent more than 50 minute reviewing data/chart, and coordinating care and >50% direct face to face time providing counseling/discussing diagnostics/treatment plan with patient      LOS: 5 days   Jabier Mutton 03/16/2023, 4:09 PM

## 2023-03-17 DIAGNOSIS — I2699 Other pulmonary embolism without acute cor pulmonale: Secondary | ICD-10-CM | POA: Diagnosis not present

## 2023-03-17 DIAGNOSIS — R509 Fever, unspecified: Secondary | ICD-10-CM | POA: Diagnosis not present

## 2023-03-17 NOTE — Progress Notes (Signed)
[facility] PROGRESS NOTE    Progress Note  Evelyn [name]  [id number] DOB: 05-23-1971 DOA: 03/09/2023 PCP: Loyola Mast, PA-C     Brief Narrative:   Evelyn [name] is an 52 y.o. 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  Assessment/Plan:   Pulmonary embolism on left [facility]) Placed on Xarelto which she has been tolerating. Not tachycardic satting greater 96% on room air.  Fever of unknown source: ID and neurosurgery was consulted blood cultures were drawn on 03/11/2023 they have remained negative till date. Tmax of 103. Neurosurgery performed aspirate of CSF. fluid cultures have remained negative till date. ID think there is inflammatory fever in nature.  Like to monitor for another week and follow-up as an outpatient. Follow-up appointment at the ID clinic on 4 9 3:30 PM.  Right-sided chest pain/abdominal pain: CT scan of the abdomen pelvis showed no acute findings. Pain is controlled with tramadol and lidocaine patch.  Atypical intracranial meningioma: Status post surgery on 02/08/2023. She is planned to start radiation therapy with Dr. Baird Kay but it was postponed until next week due to her fevers. Continue Keppra and Topamax.  Normocytic anemia: Hemoglobin is stable.  Adjustment disorder with anxiety and depression: Continue Cymbalta.  Obstructive sleep apnea:  has not been using her CPAP at home.   DVT prophylaxis: lovenox Family Communication:none Status is: Inpatient Remains inpatient appropriate because: New  PE.    Code Status:     Code Status Orders  (From admission, onward)           Start     Ordered   03/09/23 2101  Full code  Continuous       Question:  By:  Answer:  Consent: discussion documented in EHR   03/09/23 2101           Code Status History     Date Active Date Inactive Code Status Order ID Comments User Context   02/15/2023 1741 02/26/2023 1646 Full Code [id number]  Ortencia Kick Inpatient   02/15/2023 1741 02/15/2023 1741 Full Code [id number]  Ortencia Kick Inpatient   02/08/2023 1545 02/15/2023 1736 Full Code [id number]  Collene Schlichter, PA-C Inpatient      Advance Directive Documentation    Flowsheet Row Most Recent Value  Type of Advance Directive Healthcare Power of Attorney, Living will  Pre-existing out of facility DNR order (yellow form or pink MOST form) --  "MOST" Form in Place? --         IV Access:   Peripheral IV   Procedures and diagnostic studies:   No results found.   Medical Consultants:   None.   Subjective:    Evelyn [name] no complaints.  Objective:    Vitals:   03/16/23 2216 03/16/23 2339 03/17/23 0513 03/17/23 0805  BP: 109/65 (!) 107/58 (!) 99/58 114/71  Pulse: (!) 104 93 85 88  Resp:  19  17  Temp: (!) 101.9 F (38.8 C) 98.2 F (36.8 C) 98.6 F (37 C) 98.9 F (37.2 C)  TempSrc: Oral Oral Oral Oral  SpO2: 100% 100% 100%  99%  Weight:      Height:       SpO2: 99 % O2 Flow Rate (L/min): 2 L/min  No intake or output data in the 24 hours ending 03/17/23 0853  Filed Weights   03/09/23 1502  Weight: 127 kg    Exam: General exam: In no acute distress. Respiratory system: Good air movement and clear to auscultation. Cardiovascular system: S1 & S2 heard, RRR. No JVD. Gastrointestinal system: Abdomen is nondistended, soft and nontender.  Extremities: No pedal edema. Skin: No rashes, lesions or ulcers Psychiatry: Judgement and insight appear normal. Mood & affect  appropriate. Data Reviewed:    Labs: Basic Metabolic Panel: Recent Labs  Lab 03/12/23 0320 03/13/23 0043 03/14/23 0122  NA 132* 131* 133*  K 3.6 3.7 3.8  CL 102 103 102  CO2 20* 21* 23  GLUCOSE 105* 104* 94  BUN 10 7 6   CREATININE 0.78 0.70 0.72  CALCIUM 8.4* 8.5* 8.5*  MG 2.0 2.1 2.2    GFR Estimated Creatinine Clearance: 112.7 mL/min (by C-G formula based on SCr of 0.72 mg/dL). Liver Function Tests: Recent Labs  Lab 03/12/23 0320 03/13/23 0043  AST 13* 22  ALT 11 16  ALKPHOS 53 54  BILITOT 0.4 0.4  PROT 6.5 6.2*  ALBUMIN 2.6* 2.3*    No results for input(s): "LIPASE", "AMYLASE" in the last 168 hours.  No results for input(s): "AMMONIA" in the last 168 hours. Coagulation profile No results for input(s): "INR", "PROTIME" in the last 168 hours. COVID-19 Labs  Recent Labs    03/15/23 1025  LDH 177     Lab Results  Component Value Date   SARSCOV2NAA NEGATIVE 03/11/2023   [place] Not Detected 10/10/2019    CBC: Recent Labs  Lab 03/12/23 0320 03/13/23 0043 03/14/23 0122  WBC 6.8 6.3 6.7  HGB 8.8* 7.9* 8.2*  HCT 27.0* 23.8* 25.5*  MCV 80.8 79.6* 80.4  PLT 311 309 331    Cardiac Enzymes: No results for input(s): "CKTOTAL", "CKMB", "CKMBINDEX", "TROPONINI" in the last 168 hours. BNP (last 3 results) No results for input(s): "PROBNP" in the last 8760 hours. CBG: No results for input(s): "GLUCAP" in the last 168 hours. D-Dimer: No results for input(s): "DDIMER" in the last 72 hours. Hgb A1c: No results for input(s): "HGBA1C" in the last 72 hours. Lipid Profile: No results for input(s): "CHOL", "HDL", "LDLCALC", "TRIG", "CHOLHDL", "LDLDIRECT" in the last 72 hours. Thyroid function studies: No results for input(s): "TSH", "T4TOTAL", "T3FREE", "THYROIDAB" in the last 72 hours.  Invalid input(s): "FREET3" Anemia work up: No results for input(s): "VITAMINB12", "FOLATE", "FERRITIN", "TIBC", "IRON", "RETICCTPCT" in the last 72 hours. Sepsis  Labs: Recent Labs  Lab 03/12/23 0320 03/13/23 0043 03/14/23 0122  WBC 6.8 6.3 6.7    Microbiology Recent Results (from the past 240 hour(s))  Resp panel by RT-PCR (RSV, Flu A&B, Covid) Anterior Nasal Swab     Status: None   Collection Time: 03/11/23  7:09 AM   Specimen: Anterior Nasal Swab  Result Value Ref Range Status   SARS Coronavirus 2 by RT PCR NEGATIVE NEGATIVE Final   Influenza A by PCR NEGATIVE NEGATIVE Final   Influenza B by PCR NEGATIVE NEGATIVE Final    Comment: (NOTE) The Xpert Xpress SARS-CoV-2/FLU/RSV plus assay is intended as an aid in the diagnosis of influenza from Nasopharyngeal swab specimens and should not be used as a sole basis for treatment. Nasal washings and aspirates are unacceptable for Xpert Xpress SARS-CoV-2/FLU/RSV testing.  Fact  Sheet for Patients: EntrepreneurPulse.com.au  Fact Sheet for Healthcare Providers: IncredibleEmployment.be  This test is not yet approved or cleared by the [place] FDA and has been authorized for detection and/or diagnosis of SARS-CoV-2 by FDA under an Emergency Use Authorization (EUA). This EUA will remain in effect (meaning this test can be used) for the duration of the COVID-19 declaration under Section 564(b)(1) of the Act, 21 U.S.C. section 360bbb-3(b)(1), unless the authorization is terminated or revoked.     Resp Syncytial Virus by PCR NEGATIVE NEGATIVE Final    Comment: (NOTE) Fact Sheet for Patients: EntrepreneurPulse.com.au  Fact Sheet for Healthcare Providers: IncredibleEmployment.be  This test is not yet approved or cleared by the [place] FDA and has been authorized for detection and/or diagnosis of SARS-CoV-2 by FDA under an Emergency Use Authorization (EUA). This EUA will remain in effect (meaning this test can be used) for the duration of the COVID-19 declaration under Section 564(b)(1) of the Act, 21  U.S.C. section 360bbb-3(b)(1), unless the authorization is terminated or revoked.  Performed at [facility], [place] [address], [place], [place] [address]   Culture, blood (Routine X 2) w Reflex to ID Panel     Status: None   Collection Time: 03/11/23  7:50 AM   Specimen: BLOOD  Result Value Ref Range Status   Specimen Description BLOOD RIGHT ANTECUBITAL  Final   Special Requests   Final    BOTTLES DRAWN AEROBIC AND ANAEROBIC Blood Culture adequate volume   Culture   Final    NO GROWTH 5 DAYS Performed at [facility], [place] [address], [place], [place] [address]    Report Status 03/16/2023 FINAL  Final  Culture, blood (Routine X 2) w Reflex to ID Panel     Status: None   Collection Time: 03/11/23 11:30 AM   Specimen: BLOOD RIGHT ARM  Result Value Ref Range Status   Specimen Description BLOOD RIGHT ARM  Final   Special Requests   Final    BOTTLES DRAWN AEROBIC ONLY Blood Culture results may not be optimal due to an inadequate volume of blood received in culture bottles   Culture   Final    NO GROWTH 5 DAYS Performed at [facility], [place] [address], [place], [place] [address]    Report Status 03/16/2023 FINAL  Final  MRSA Next Gen by PCR, Nasal     Status: None   Collection Time: 03/11/23  6:00 PM   Specimen: Nasal Mucosa; Nasal Swab  Result Value Ref Range Status   MRSA by PCR Next Gen NOT DETECTED NOT DETECTED Final    Comment: (NOTE) The GeneXpert MRSA Assay (FDA approved for NASAL specimens only), is one component of a comprehensive MRSA colonization surveillance program. It is not intended to diagnose MRSA infection nor to guide or monitor treatment for MRSA infections. Test performance is not FDA approved in patients less than 71 years old. Performed at [facility], [place] [address], [place], [place] [address]   CSF culture w Gram Stain     Status: None (Preliminary result)   Collection Time: 03/15/23  7:17 AM   Specimen: CSF; Cerebrospinal Fluid   Result Value Ref Range Status   Specimen Description CSF  Final   Special Requests LP  Final   Gram Stain   Final    WBC PRESENT,BOTH PMN AND MONONUCLEAR NO ORGANISMS SEEN CYTOSPIN SMEAR    Culture   Final    NO GROWTH 1 DAY  Performed at [facility], [place] [address] [place], [place] [address]    Report Status PENDING  Incomplete  Respiratory (~20 pathogens) panel by PCR     Status: None   Collection Time: 03/15/23 10:55 AM   Specimen: Nasopharyngeal Swab; Respiratory  Result Value Ref Range Status   Adenovirus NOT DETECTED NOT DETECTED Final   Coronavirus 229E NOT DETECTED NOT DETECTED Final    Comment: (NOTE) The Coronavirus on the Respiratory Panel, DOES NOT test for the novel  Coronavirus (2019 nCoV)    Coronavirus HKU1 NOT DETECTED NOT DETECTED Final   Coronavirus NL63 NOT DETECTED NOT DETECTED Final   Coronavirus OC43 NOT DETECTED NOT DETECTED Final   Metapneumovirus NOT DETECTED NOT DETECTED Final   Rhinovirus / Enterovirus NOT DETECTED NOT DETECTED Final   Influenza A NOT DETECTED NOT DETECTED Final   Influenza B NOT DETECTED NOT DETECTED Final   Parainfluenza Virus 1 NOT DETECTED NOT DETECTED Final   Parainfluenza Virus 2 NOT DETECTED NOT DETECTED Final   Parainfluenza Virus 3 NOT DETECTED NOT DETECTED Final   Parainfluenza Virus 4 NOT DETECTED NOT DETECTED Final   Respiratory Syncytial Virus NOT DETECTED NOT DETECTED Final   Bordetella pertussis NOT DETECTED NOT DETECTED Final   Bordetella Parapertussis NOT DETECTED NOT DETECTED Final   Chlamydophila pneumoniae NOT DETECTED NOT DETECTED Final   Mycoplasma pneumoniae NOT DETECTED NOT DETECTED Final    Comment: Performed at [facility], [place]. [address] [place], [place] [address]     Medications:    calcium carbonate  200 mg of elemental calcium Oral TID   dextromethorphan-guaiFENesin  1 tablet Oral BID   DULoxetine  30 mg Oral QHS   DULoxetine  60 mg Oral Daily   famotidine  20 mg Oral BID    feeding supplement  237 mL Oral TID BM   gabapentin  200 mg Oral TID   levETIRAcetam  750 mg Oral BID   lidocaine  1 patch Transdermal Q24H   multivitamin with minerals  1 tablet Oral BID   Rivaroxaban  15 mg Oral BID WC   Followed by   Derrill Memo ON 03/31/2023] rivaroxaban  20 mg Oral Q supper   sodium chloride flush  3 mL Intravenous Q12H   topiramate  50 mg Oral BID   Continuous Infusions:    LOS: 6 days   Charlynne Cousins  [facility]  03/17/2023, 8:53 AM

## 2023-03-18 DIAGNOSIS — I2699 Other pulmonary embolism without acute cor pulmonale: Secondary | ICD-10-CM | POA: Diagnosis not present

## 2023-03-18 DIAGNOSIS — D420 Neoplasm of uncertain behavior of cerebral meninges: Secondary | ICD-10-CM | POA: Diagnosis not present

## 2023-03-18 LAB — CSF CULTURE W GRAM STAIN: Culture: NO GROWTH

## 2023-03-18 MED ORDER — RIVAROXABAN 20 MG PO TABS: 20.0000 mg | ORAL_TABLET | Freq: Every day | ORAL | 1 refills | Status: DC

## 2023-03-18 MED ORDER — RIVAROXABAN 15 MG PO TABS: 15.0000 mg | ORAL_TABLET | Freq: Two times a day (BID) | ORAL | 0 refills | Status: DC

## 2023-03-18 MED ORDER — CALCIUM CARBONATE ANTACID 500 MG PO CHEW: 200.0000 mg | CHEWABLE_TABLET | Freq: Three times a day (TID) | ORAL | 0 refills | Status: AC

## 2023-03-18 MED ORDER — DM-GUAIFENESIN ER 30-600 MG PO TB12: 1.0000 | ORAL_TABLET | Freq: Two times a day (BID) | ORAL | 0 refills | Status: AC

## 2023-03-18 NOTE — Plan of Care (Signed)
  Problem: Education: Goal: Knowledge of the prescribed therapeutic regimen will improve Outcome: Adequate for Discharge   Problem: Clinical Measurements: Goal: Usual level of consciousness will be regained or maintained. Outcome: Adequate for Discharge Goal: Neurologic status will improve Outcome: Adequate for Discharge Goal: Ability to maintain intracranial pressure will improve Outcome: Adequate for Discharge   Problem: Skin Integrity: Goal: Demonstration of wound healing without infection will improve Outcome: Adequate for Discharge   Problem: Education: Goal: Knowledge of General Education information will improve Description: Including pain rating scale, medication(s)/side effects and non-pharmacologic comfort measures Outcome: Adequate for Discharge   Problem: Health Behavior/Discharge Planning: Goal: Ability to manage health-related needs will improve Outcome: Adequate for Discharge   Problem: Clinical Measurements: Goal: Ability to maintain clinical measurements within normal limits will improve Outcome: Adequate for Discharge Goal: Will remain free from infection Outcome: Adequate for Discharge Goal: Diagnostic test results will improve Outcome: Adequate for Discharge Goal: Respiratory complications will improve Outcome: Adequate for Discharge Goal: Cardiovascular complication will be avoided Outcome: Adequate for Discharge   Problem: Activity: Goal: Risk for activity intolerance will decrease Outcome: Adequate for Discharge   Problem: Nutrition: Goal: Adequate nutrition will be maintained Outcome: Adequate for Discharge   Problem: Coping: Goal: Level of anxiety will decrease Outcome: Adequate for Discharge   Problem: Elimination: Goal: Will not experience complications related to bowel motility Outcome: Adequate for Discharge Goal: Will not experience complications related to urinary retention Outcome: Adequate for Discharge   Problem: Pain  Managment: Goal: General experience of comfort will improve Outcome: Adequate for Discharge   Problem: Safety: Goal: Ability to remain free from injury will improve Outcome: Adequate for Discharge   Problem: Skin Integrity: Goal: Risk for impaired skin integrity will decrease Outcome: Adequate for Discharge   Problem: Inadequate Intake (NI-2.1) Goal: Food and/or nutrient delivery Description: Individualized approach for food/nutrient provision. Outcome: Adequate for Discharge

## 2023-03-18 NOTE — Progress Notes (Signed)
[facility] PROGRESS NOTE    Progress Note  Evelyn [name]  [id number] DOB: 1971/02/20 DOA: 03/09/2023 PCP: Loyola Mast, PA-C     Brief Narrative:   Evelyn [name] is an 52 y.o. 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  Assessment/Plan:   Pulmonary embolism on left [facility]) Placed on Xarelto which she has been tolerating. Not tachycardic satting greater 96% on room air.  Fever of unknown source: Tmax of 101. CSF cultures have remained negative, continue Tylenol for fevers, patient would like to be monitor 1 more day she is really nervous about going home with these high fevers. Follow-up appointment at the ID clinic on 4 9 3:30 PM.  Right-sided chest pain/abdominal pain: CT scan of the abdomen pelvis showed no acute findings. Pain is controlled with tramadol and lidocaine patch.  Atypical intracranial meningioma: Status post surgery on 02/08/2023. She is planned to start radiation therapy with Dr. Baird Kay but it was postponed until next week due to her fevers. Continue Keppra and Topamax.  Normocytic anemia: Hemoglobin is stable.  Adjustment disorder with anxiety and depression: Continue Cymbalta.  Obstructive sleep apnea:  has not been using her CPAP at home.   DVT prophylaxis: lovenox Family Communication:none Status is: Inpatient Remains inpatient appropriate because: New PE.    Code Status:     Code Status Orders  (From admission, onward)           Start     Ordered   03/09/23 2101  Full code   Continuous       Question:  By:  Answer:  Consent: discussion documented in EHR   03/09/23 2101           Code Status History     Date Active Date Inactive Code Status Order ID Comments User Context   02/15/2023 1741 02/26/2023 1646 Full Code [id number]  Ortencia Kick Inpatient   02/15/2023 1741 02/15/2023 1741 Full Code [id number]  Ortencia Kick Inpatient   02/08/2023 1545 02/15/2023 1736 Full Code [id number]  Collene Schlichter, PA-C Inpatient      Advance Directive Documentation    Flowsheet Row Most Recent Value  Type of Advance Directive Healthcare Power of Attorney, Living will  Pre-existing out of facility DNR order (yellow form or pink MOST form) --  "MOST" Form in Place? --         IV Access:   Peripheral IV   Procedures and diagnostic studies:   No results found.   Medical Consultants:   None.   Subjective:    Evelyn [name] no complaints  Objective:    Vitals:   03/17/23 1556 03/17/23 2050 03/17/23 2335 03/18/23 0658  BP: 105/66 125/70 (!) 96/59 (!) 108/51  Pulse: (!) 103 100 92 94  Resp: 18 19 18 18   Temp: 100.3 F (37.9 C) (!) 101.1 F (38.4 C) 99 F (37.2 C) 99.3 F (37.4 C)  TempSrc: Oral Oral Oral Oral  SpO2: 98% 99% 98% 99%  Weight:      Height:       SpO2: 99 % O2 Flow  Rate (L/min): 2 L/min   Intake/Output Summary (Last 24 hours) at 03/18/2023 0755 Last data filed at 03/17/2023 1345 Gross per 24 hour  Intake 540 ml  Output --  Net 540 ml    Filed Weights   03/09/23 1502  Weight: 127 kg    Exam: General exam: In no acute distress. Respiratory system: Good air movement and clear to auscultation. Cardiovascular system: S1 & S2 heard, RRR. No JVD. Gastrointestinal system: Abdomen is nondistended, soft and nontender.  Extremities: No pedal edema. Skin: No rashes, lesions or ulcers Psychiatry: Judgement and insight appear normal. Mood & affect appropriate. Data Reviewed:    Labs: Basic Metabolic  Panel: Recent Labs  Lab 03/12/23 0320 03/13/23 0043 03/14/23 0122  NA 132* 131* 133*  K 3.6 3.7 3.8  CL 102 103 102  CO2 20* 21* 23  GLUCOSE 105* 104* 94  BUN 10 7 6   CREATININE 0.78 0.70 0.72  CALCIUM 8.4* 8.5* 8.5*  MG 2.0 2.1 2.2    GFR Estimated Creatinine Clearance: 112.7 mL/min (by C-G formula based on SCr of 0.72 mg/dL). Liver Function Tests: Recent Labs  Lab 03/12/23 0320 03/13/23 0043  AST 13* 22  ALT 11 16  ALKPHOS 53 54  BILITOT 0.4 0.4  PROT 6.5 6.2*  ALBUMIN 2.6* 2.3*    No results for input(s): "LIPASE", "AMYLASE" in the last 168 hours.  No results for input(s): "AMMONIA" in the last 168 hours. Coagulation profile No results for input(s): "INR", "PROTIME" in the last 168 hours. COVID-19 Labs  Recent Labs    03/15/23 1025  LDH 177     Lab Results  Component Value Date   SARSCOV2NAA NEGATIVE 03/11/2023   [place] Not Detected 10/10/2019    CBC: Recent Labs  Lab 03/12/23 0320 03/13/23 0043 03/14/23 0122  WBC 6.8 6.3 6.7  HGB 8.8* 7.9* 8.2*  HCT 27.0* 23.8* 25.5*  MCV 80.8 79.6* 80.4  PLT 311 309 331    Cardiac Enzymes: No results for input(s): "CKTOTAL", "CKMB", "CKMBINDEX", "TROPONINI" in the last 168 hours. BNP (last 3 results) No results for input(s): "PROBNP" in the last 8760 hours. CBG: No results for input(s): "GLUCAP" in the last 168 hours. D-Dimer: No results for input(s): "DDIMER" in the last 72 hours. Hgb A1c: No results for input(s): "HGBA1C" in the last 72 hours. Lipid Profile: No results for input(s): "CHOL", "HDL", "LDLCALC", "TRIG", "CHOLHDL", "LDLDIRECT" in the last 72 hours. Thyroid function studies: No results for input(s): "TSH", "T4TOTAL", "T3FREE", "THYROIDAB" in the last 72 hours.  Invalid input(s): "FREET3" Anemia work up: No results for input(s): "VITAMINB12", "FOLATE", "FERRITIN", "TIBC", "IRON", "RETICCTPCT" in the last 72 hours. Sepsis Labs: Recent Labs  Lab 03/12/23 0320 03/13/23 0043  03/14/23 0122  WBC 6.8 6.3 6.7    Microbiology Recent Results (from the past 240 hour(s))  Resp panel by RT-PCR (RSV, Flu A&B, Covid) Anterior Nasal Swab     Status: None   Collection Time: 03/11/23  7:09 AM   Specimen: Anterior Nasal Swab  Result Value Ref Range Status   SARS Coronavirus 2 by RT PCR NEGATIVE NEGATIVE Final   Influenza A by PCR NEGATIVE NEGATIVE Final   Influenza B by PCR NEGATIVE NEGATIVE Final    Comment: (NOTE) The Xpert Xpress SARS-CoV-2/FLU/RSV plus assay is intended as an aid in the diagnosis of influenza from Nasopharyngeal swab specimens and should not be used as a sole basis for treatment. Nasal washings and aspirates are unacceptable for Xpert Xpress SARS-CoV-2/FLU/RSV testing.  Fact  Sheet for Patients: EntrepreneurPulse.com.au  Fact Sheet for Healthcare Providers: IncredibleEmployment.be  This test is not yet approved or cleared by the [place] FDA and has been authorized for detection and/or diagnosis of SARS-CoV-2 by FDA under an Emergency Use Authorization (EUA). This EUA will remain in effect (meaning this test can be used) for the duration of the COVID-19 declaration under Section 564(b)(1) of the Act, 21 U.S.C. section 360bbb-3(b)(1), unless the authorization is terminated or revoked.     Resp Syncytial Virus by PCR NEGATIVE NEGATIVE Final    Comment: (NOTE) Fact Sheet for Patients: EntrepreneurPulse.com.au  Fact Sheet for Healthcare Providers: IncredibleEmployment.be  This test is not yet approved or cleared by the [place] FDA and has been authorized for detection and/or diagnosis of SARS-CoV-2 by FDA under an Emergency Use Authorization (EUA). This EUA will remain in effect (meaning this test can be used) for the duration of the COVID-19 declaration under Section 564(b)(1) of the Act, 21 U.S.C. section 360bbb-3(b)(1), unless the authorization is  terminated or revoked.  Performed at [facility], [place] [address], [place], [place] [address]   Culture, blood (Routine X 2) w Reflex to ID Panel     Status: None   Collection Time: 03/11/23  7:50 AM   Specimen: BLOOD  Result Value Ref Range Status   Specimen Description BLOOD RIGHT ANTECUBITAL  Final   Special Requests   Final    BOTTLES DRAWN AEROBIC AND ANAEROBIC Blood Culture adequate volume   Culture   Final    NO GROWTH 5 DAYS Performed at [facility], [place] [address], [place], [place] [address]    Report Status 03/16/2023 FINAL  Final  Culture, blood (Routine X 2) w Reflex to ID Panel     Status: None   Collection Time: 03/11/23 11:30 AM   Specimen: BLOOD RIGHT ARM  Result Value Ref Range Status   Specimen Description BLOOD RIGHT ARM  Final   Special Requests   Final    BOTTLES DRAWN AEROBIC ONLY Blood Culture results may not be optimal due to an inadequate volume of blood received in culture bottles   Culture   Final    NO GROWTH 5 DAYS Performed at [facility], [place] [address], [place], [place] [address]    Report Status 03/16/2023 FINAL  Final  MRSA Next Gen by PCR, Nasal     Status: None   Collection Time: 03/11/23  6:00 PM   Specimen: Nasal Mucosa; Nasal Swab  Result Value Ref Range Status   MRSA by PCR Next Gen NOT DETECTED NOT DETECTED Final    Comment: (NOTE) The GeneXpert MRSA Assay (FDA approved for NASAL specimens only), is one component of a comprehensive MRSA colonization surveillance program. It is not intended to diagnose MRSA infection nor to guide or monitor treatment for MRSA infections. Test performance is not FDA approved in patients less than 86 years old. Performed at [facility], [place] [address] [place], [place] [address]   CSF culture w Gram Stain     Status: None (Preliminary result)   Collection Time: 03/15/23  7:17 AM   Specimen: CSF; Cerebrospinal Fluid  Result Value Ref Range Status   Specimen Description CSF   Final   Special Requests LP  Final   Gram Stain   Final    WBC PRESENT,BOTH PMN AND MONONUCLEAR NO ORGANISMS SEEN CYTOSPIN SMEAR    Culture   Final    NO GROWTH 2 DAYS  Performed at [facility], [place] [address], [place], [place] [address]    Report Status PENDING  Incomplete  Respiratory (~20 pathogens) panel by PCR     Status: None   Collection Time: 03/15/23 10:55 AM   Specimen: Nasopharyngeal Swab; Respiratory  Result Value Ref Range Status   Adenovirus NOT DETECTED NOT DETECTED Final   Coronavirus 229E NOT DETECTED NOT DETECTED Final    Comment: (NOTE) The Coronavirus on the Respiratory Panel, DOES NOT test for the novel  Coronavirus (2019 nCoV)    Coronavirus HKU1 NOT DETECTED NOT DETECTED Final   Coronavirus NL63 NOT DETECTED NOT DETECTED Final   Coronavirus OC43 NOT DETECTED NOT DETECTED Final   Metapneumovirus NOT DETECTED NOT DETECTED Final   Rhinovirus / Enterovirus NOT DETECTED NOT DETECTED Final   Influenza A NOT DETECTED NOT DETECTED Final   Influenza B NOT DETECTED NOT DETECTED Final   Parainfluenza Virus 1 NOT DETECTED NOT DETECTED Final   Parainfluenza Virus 2 NOT DETECTED NOT DETECTED Final   Parainfluenza Virus 3 NOT DETECTED NOT DETECTED Final   Parainfluenza Virus 4 NOT DETECTED NOT DETECTED Final   Respiratory Syncytial Virus NOT DETECTED NOT DETECTED Final   Bordetella pertussis NOT DETECTED NOT DETECTED Final   Bordetella Parapertussis NOT DETECTED NOT DETECTED Final   Chlamydophila pneumoniae NOT DETECTED NOT DETECTED Final   Mycoplasma pneumoniae NOT DETECTED NOT DETECTED Final    Comment: Performed at [facility], [place]. [address] [place], [place] [address]     Medications:    calcium carbonate  200 mg of elemental calcium Oral TID   dextromethorphan-guaiFENesin  1 tablet Oral BID   DULoxetine  30 mg Oral QHS   DULoxetine  60 mg Oral Daily   famotidine  20 mg Oral BID   feeding supplement  237 mL Oral TID BM   gabapentin  200 mg  Oral TID   levETIRAcetam  750 mg Oral BID   lidocaine  1 patch Transdermal Q24H   multivitamin with minerals  1 tablet Oral BID   Rivaroxaban  15 mg Oral BID WC   Followed by   Derrill Memo ON 03/31/2023] rivaroxaban  20 mg Oral Q supper   sodium chloride flush  3 mL Intravenous Q12H   topiramate  50 mg Oral BID   Continuous Infusions:    LOS: 7 days   Charlynne Cousins  [facility]  03/18/2023, 7:55 AM

## 2023-03-18 NOTE — Discharge Summary (Signed)
Physician Discharge Summary  Evelyn [name] [id number] DOB: 29-May-1971 DOA: 03/09/2023  PCP: Loyola Mast, PA-C  Admit date: 03/09/2023 Discharge date: 03/18/2023  Admitted From: Home  Disposition:  Home   Recommendations for Outpatient Follow-up:  Follow up with ID in 1-2 weeks Please obtain BMP/CBC in one week   Home Health:No   Equipment/Devices:None   Discharge Condition:Stable   CODE STATUS:Full   Diet recommendation: Heart Healthy   Brief/Interim Summary:  52 y.o. 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  Fever of unknown source: ID was consulted as well as neurosurgery blood cultures were done on 03/11/2023 and they remain negative till date.  She was started on antibiotics but held on the third day she was monitored for at least 7 days off antibiotics. Her infectious work up remain negative. She continued to have fevers she was placed on Tylenol  with improvement in her fevers. She continue with on going fevers ID discussed with neurosurgery and they perform an aspirate of CSF was sent for culture and Gram stain which remain negative till the date of discharge. She was able to tolerate her diet and orals, she felt better while taking the Tylenol.  ID is concerned about inflammatory fevers due to her recent surgical intervention she will follow-up with ID as an outpatient.  Right-sided chest pain/abdominal pain: CT scan of the abdomen pelvis showed no acute findings pain control and lidocaine and tramadol.  Atypical intracranial meningioma: Status post surgical intervention 02/08/2023. She is planned to start radiation therapy with Dr. Baird Kay but it was postponed due to her recent fevers. She will follow-up with Dr. Baird Kay as an outpatient and start radiation as an out therapy. She was continued on Keppra and Topamax no changes made to her medication.  Normocytic anemia: Hemoglobin remained stable no signs of overt bleeding.  Adjustment disorder with anxiety and depression: Continue Cymbalta.  Obstructive sleep apnea: She has been noncompliant with her CPAP at home she did not want to use it in the hospital.   Discharge Instructions  Discharge Instructions     Diet - low sodium heart healthy   Complete by: As directed    Increase activity slowly   Complete by: As directed       Allergies as of 03/18/2023       Reactions   Dilaudid [hydromorphone Hcl] Nausea And Vomiting   Morphine And Related Other (See Comments)   Fever and chills and convulsions   Vicodin [hydrocodone-acetaminophen] Nausea And Vomiting  Dizziness        Medication List     STOP taking these medications    diclofenac Sodium 1 % Gel Commonly known as: VOLTAREN       TAKE these medications    acetaminophen 500 MG tablet Commonly known as: TYLENOL Take 1,000 mg by mouth in the morning and at bedtime.    butalbital-acetaminophen-caffeine 50-325-40 MG tablet Commonly known as: FIORICET Take 2 tablets by mouth every 6 (six) hours as needed for headache (first line for headache).   Not to exceed 6 tablets per day   calcium carbonate 500 MG chewable tablet Commonly known as: TUMS - dosed in mg elemental calcium Chew 1 tablet (200 mg of elemental calcium total) by mouth 3 (three) times daily.   dextromethorphan-guaiFENesin 30-600 MG 12hr tablet Commonly known as: MUCINEX DM Take 1 tablet by mouth 2 (two) times daily.   DULoxetine 30 MG capsule Commonly known as: CYMBALTA Take 2 capsules (60mg ) by mouth daily at 8:00am and 1 capsule (30mg ) nightly at 10:00pm   famotidine 20 MG tablet Commonly known as: PEPCID Take 1 tablet (20 mg total) by mouth 2 (two) times daily.   levETIRAcetam 750 MG tablet Commonly known as: KEPPRA Take 1 tablet (750 mg total) by mouth 2 (two) times daily.   methocarbamol 500 MG tablet Commonly known as: ROBAXIN Take 1 tablet (500 mg total) by mouth every 6 (six) hours as needed for muscle spasms.   polyethylene glycol 17 g packet Commonly known as: MIRALAX / GLYCOLAX Take 17 g by mouth 2 (two) times daily.   Rivaroxaban 15 MG Tabs tablet Commonly known as: XARELTO Take 1 tablet (15 mg total) by mouth 2 (two) times daily with a meal for 12 days.   rivaroxaban 20 MG Tabs tablet Commonly known as: XARELTO Take 1 tablet (20 mg total) by mouth daily with supper. Start taking on: March 31, 2023   topiramate 50 MG tablet Commonly known as: TOPAMAX Take 1 tablet (50 mg total) by mouth 2 (two) times daily.   Vitamin D (Ergocalciferol) 1.25 MG (50000 UNIT) Caps capsule Commonly known as: DRISDOL Take 1 capsule (50,000 Units total) by mouth once a week.        Follow-up Information     Medicaid Transportation Follow up.   Contact information: [phone]               Allergies  Allergen Reactions   Dilaudid [Hydromorphone Hcl] Nausea And  Vomiting   Morphine And Related Other (See Comments)    Fever and chills and convulsions   Vicodin [Hydrocodone-Acetaminophen] Nausea And Vomiting    Dizziness     Consultations: ID NeuroSx   Procedures/Studies: MR THORACIC SPINE W WO CONTRAST  Result Date: 03/12/2023 CLINICAL DATA:  Initial evaluation for mid and lower back pain, infection suspected. EXAM: MRI THORACIC AND LUMBAR SPINE WITHOUT AND WITH CONTRAST TECHNIQUE: Multiplanar and multiecho pulse sequences of the thoracic and lumbar spine were obtained without and with intravenous contrast. CONTRAST:  11mL GADAVIST GADOBUTROL 1 MMOL/ML IV SOLN COMPARISON:  Prior MRIs from 03/11/2023. FINDINGS: MRI THORACIC SPINE FINDINGS Alignment: Physiologic with preservation of the normal thoracic kyphosis. No listhesis. Vertebrae: Vertebral body height maintained without acute or chronic fracture. Bone marrow signal intensity mildly heterogeneous but overall within normal limits. No worrisome osseous lesions. No evidence for osteomyelitis discitis or septic arthritis. Cord: Normal signal and morphology. No abnormal enhancement. No epidural abscess or other collection. Paraspinal and other soft tissues: No  paraspinous inflammatory changes or collections. Small layering bilateral pleural effusions, right larger than left. Disc levels: Mild for age multilevel degenerative spondylosis noted throughout the mid thoracic spine. No significant spinal stenosis. Foramina remain patent. No neural impingement. MRI LUMBAR SPINE FINDINGS Segmentation: Standard. Lowest well-formed disc space labeled the L5-S1 level. Alignment: Physiologic with preservation of the normal lumbar lordosis. No listhesis. Vertebrae: Vertebral body height maintained without acute or chronic fracture. Bone marrow signal intensity heterogeneous but overall within normal limits. No discrete or worrisome osseous lesions. No evidence for osteomyelitis discitis or septic arthritis. Conus  medullaris: Extends to the L1 level and appears normal. No abnormal enhancement. No epidural abscess or other collection. Paraspinal and other soft tissues: No paraspinous inflammatory changes or collections. No other acute finding. Disc levels: L1-2: Negative interspace. Mild facet hypertrophy. No canal or foraminal stenosis. L2-3: Disc desiccation with mild disc bulge, slightly eccentric to the left. Mild left greater than right facet hypertrophy. No significant spinal stenosis. Foramina remain patent. L3-4: Mild degenerative intervertebral disc space narrowing with diffuse disc bulge, slightly asymmetric to the left. Mild bilateral facet and ligament flavum hypertrophy. No significant spinal stenosis. Foramina remain patent. L4-5: Disc desiccation with mild diffuse disc bulge. Superimposed right foraminal disc protrusion contacts the exiting right L4 nerve root (series 13, image 6). Moderate right worse than left facet hypertrophy with associated trace joint effusions. Mild narrowing of the lateral recesses bilaterally. Central canal remains patent. Mild right greater than left L4 foraminal stenosis. L5-S1: Disc desiccation. Shallow broad-based central to left subarticular disc protrusion with annular fissure (series 14, image 38). Protruding disc closely approximates and/or contacts both of the descending S1 nerve roots as they course through the lateral recesses, greater on the left. Left S1 nerve root is slightly displaced posteriorly. Mild to moderate facet hypertrophy. No significant spinal stenosis. Foramina remain patent. IMPRESSION: MRI THORACIC SPINE IMPRESSION: 1. No evidence for acute infection within the thoracic spine. 2. Small layering bilateral pleural effusions, right larger than left. MRI LUMBAR SPINE IMPRESSION: 1. No evidence for acute infection within the lumbar spine. 2. Shallow central to left subarticular disc protrusion at L5-S1, potentially affecting either of the descending S1 nerve  roots, greater on the left. 3. Right foraminal disc protrusion at L4-5, contacting the exiting right L4 nerve root. 4. Mild-to-moderate multilevel facet hypertrophy, most pronounced at L4-5 on the right. Electronically Signed   By: Jeannine Boga M.D.   On: 03/12/2023 19:33   MR Lumbar Spine W Wo Contrast  Result Date: 03/12/2023 CLINICAL DATA:  Initial evaluation for mid and lower back pain, infection suspected. EXAM: MRI THORACIC AND LUMBAR SPINE WITHOUT AND WITH CONTRAST TECHNIQUE: Multiplanar and multiecho pulse sequences of the thoracic and lumbar spine were obtained without and with intravenous contrast. CONTRAST:  63mL GADAVIST GADOBUTROL 1 MMOL/ML IV SOLN COMPARISON:  Prior MRIs from 03/11/2023. FINDINGS: MRI THORACIC SPINE FINDINGS Alignment: Physiologic with preservation of the normal thoracic kyphosis. No listhesis. Vertebrae: Vertebral body height maintained without acute or chronic fracture. Bone marrow signal intensity mildly heterogeneous but overall within normal limits. No worrisome osseous lesions. No evidence for osteomyelitis discitis or septic arthritis. Cord: Normal signal and morphology. No abnormal enhancement. No epidural abscess or other collection. Paraspinal and other soft tissues: No paraspinous inflammatory changes or collections. Small layering bilateral pleural effusions, right larger than left. Disc levels: Mild for age multilevel degenerative spondylosis noted throughout the mid thoracic spine. No significant spinal stenosis. Foramina remain patent. No neural impingement. MRI LUMBAR SPINE  FINDINGS Segmentation: Standard. Lowest well-formed disc space labeled the L5-S1 level. Alignment: Physiologic with preservation of the normal lumbar lordosis. No listhesis. Vertebrae: Vertebral body height maintained without acute or chronic fracture. Bone marrow signal intensity heterogeneous but overall within normal limits. No discrete or worrisome osseous lesions. No evidence for  osteomyelitis discitis or septic arthritis. Conus medullaris: Extends to the L1 level and appears normal. No abnormal enhancement. No epidural abscess or other collection. Paraspinal and other soft tissues: No paraspinous inflammatory changes or collections. No other acute finding. Disc levels: L1-2: Negative interspace. Mild facet hypertrophy. No canal or foraminal stenosis. L2-3: Disc desiccation with mild disc bulge, slightly eccentric to the left. Mild left greater than right facet hypertrophy. No significant spinal stenosis. Foramina remain patent. L3-4: Mild degenerative intervertebral disc space narrowing with diffuse disc bulge, slightly asymmetric to the left. Mild bilateral facet and ligament flavum hypertrophy. No significant spinal stenosis. Foramina remain patent. L4-5: Disc desiccation with mild diffuse disc bulge. Superimposed right foraminal disc protrusion contacts the exiting right L4 nerve root (series 13, image 6). Moderate right worse than left facet hypertrophy with associated trace joint effusions. Mild narrowing of the lateral recesses bilaterally. Central canal remains patent. Mild right greater than left L4 foraminal stenosis. L5-S1: Disc desiccation. Shallow broad-based central to left subarticular disc protrusion with annular fissure (series 14, image 38). Protruding disc closely approximates and/or contacts both of the descending S1 nerve roots as they course through the lateral recesses, greater on the left. Left S1 nerve root is slightly displaced posteriorly. Mild to moderate facet hypertrophy. No significant spinal stenosis. Foramina remain patent. IMPRESSION: MRI THORACIC SPINE IMPRESSION: 1. No evidence for acute infection within the thoracic spine. 2. Small layering bilateral pleural effusions, right larger than left. MRI LUMBAR SPINE IMPRESSION: 1. No evidence for acute infection within the lumbar spine. 2. Shallow central to left subarticular disc protrusion at L5-S1,  potentially affecting either of the descending S1 nerve roots, greater on the left. 3. Right foraminal disc protrusion at L4-5, contacting the exiting right L4 nerve root. 4. Mild-to-moderate multilevel facet hypertrophy, most pronounced at L4-5 on the right. Electronically Signed   By: Jeannine Boga M.D.   On: 03/12/2023 19:33   EEG adult  Result Date: 03/12/2023 Lora Havens, MD     03/12/2023  4:47 PM Patient Name: Javier Sarkisyan MRN: [id number] Epilepsy Attending: Lora Havens Referring Physician/Provider: Caren Griffins, MD Date: 318/2024 Duration: 23.31 mins Patient history:  52 y.o. woman s/p resection of large meningioma w/ post-op pseudomeningocele, here for abdominal / chest pain w/ PE. MRI brain personally reviewed, which shows left sided pseudomeningocele. EEG to evaluate for seizure. Level of alertness: Awake AEDs during EEG study: LEV, GBP, TPM Technical aspects: This EEG study was done with scalp electrodes positioned according to the 10-20 International system of electrode placement. Electrical activity was reviewed with band pass filter of 1-70Hz , sensitivity of 7 uV/mm, display speed of 2mm/sec with a 60Hz  notched filter applied as appropriate. EEG data were recorded continuously and digitally stored.  Video monitoring was available and reviewed as appropriate. Description: The posterior dominant rhythm consists of 9 Hz activity of moderate voltage (25-35 uV) seen predominantly in posterior head regions, symmetric and reactive to eye opening and eye closing. Drowsiness was characterized by attenuation of the posterior background rhythm. Physiologic photic driving was not seen during photic stimulation.  Hyperventilation was not performed.   IMPRESSION: This study is within normal limits. No seizures or epileptiform  discharges were seen throughout the recording. A normal interictal EEG does not exclude the diagnosis of epilepsy. Lora Havens   MR BRAIN W WO  CONTRAST  Result Date: 03/11/2023 CLINICAL DATA:  Headache and fever. History of meningioma resection 02/08/2023. Seizures. EXAM: MRI HEAD WITHOUT AND WITH CONTRAST MRI CERVICAL SPINE WITHOUT AND WITH CONTRAST TECHNIQUE: Multiplanar, multiecho pulse sequences of the brain and surrounding structures, and cervical spine, to include the craniocervical junction and cervicothoracic junction, were obtained without and with intravenous contrast. CONTRAST:  79mL GADAVIST GADOBUTROL 1 MMOL/ML IV SOLN COMPARISON:  02/22/2023 brain MRI FINDINGS: MRI HEAD FINDINGS Brain: Left subfrontal resection of reported atypical meningioma. T2 hyperintensity at the inferior left frontal lobe is a combination of postoperative collection and evolving encephalomalacia based on prior imaging. Enhancing tumor is seen along the fovea ethmoidalis and olfactory recess, on coronal reformats measuring 17 mm in diameter by 7 mm in thickness. This subfrontal collection is contiguous with fluid collection beneath the bone flap that visibly traverses the anterior and inferior aspect of the pterional craniotomy with spreading in the left scalp. The pseudomeningocele in the scalp has a more tense appearance, 2.7 cm in thickness compared to 1.8 cm previously. No purulence suggested by diffusion imaging. No acute infarct, hydrocephalus, or worrisome mass effect. Enhancement along the margins of the postoperative collection are nonspecific and are most likely reactive given the recent surgery timing. Vascular: Major flow voids and vascular enhancements are preserved Skull and upper cervical spine: As above Sinuses/Orbits: Unremarkable MRI CERVICAL SPINE FINDINGS Alignment: Physiologic. Vertebrae: No fracture, evidence of discitis, or bone lesion. Cord: Normal signal and morphology. Posterior Fossa, vertebral arteries, paraspinal tissues: No visible inflammation or mass Disc levels: No significant degenerative change. Certainly no neural impingement  IMPRESSION: Brain MRI: 1. 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 No purulence suggested by diffusion imaging, sterility is indeterminate in the setting of fever. 2. Small volume residual tumor along the fovea ethmoidalis and olfactory recess. Cervical MRI: Unremarkable. Electronically Signed   By: Jorje Guild M.D.   On: 03/11/2023 10:49   MR CERVICAL SPINE W WO CONTRAST  Result Date: 03/11/2023 CLINICAL DATA:  Headache and fever. History of meningioma resection 02/08/2023. Seizures. EXAM: MRI HEAD WITHOUT AND WITH CONTRAST MRI CERVICAL SPINE WITHOUT AND WITH CONTRAST TECHNIQUE: Multiplanar, multiecho pulse sequences of the brain and surrounding structures, and cervical spine, to include the craniocervical junction and cervicothoracic junction, were obtained without and with intravenous contrast. CONTRAST:  20mL GADAVIST GADOBUTROL 1 MMOL/ML IV SOLN COMPARISON:  02/22/2023 brain MRI FINDINGS: MRI HEAD FINDINGS Brain: Left subfrontal resection of reported atypical meningioma. T2 hyperintensity at the inferior left frontal lobe is a combination of postoperative collection and evolving encephalomalacia based on prior imaging. Enhancing tumor is seen along the fovea ethmoidalis and olfactory recess, on coronal reformats measuring 17 mm in diameter by 7 mm in thickness. This subfrontal collection is contiguous with fluid collection beneath the bone flap that visibly traverses the anterior and inferior aspect of the pterional craniotomy with spreading in the left scalp. The pseudomeningocele in the scalp has a more tense appearance, 2.7 cm in thickness compared to 1.8 cm previously. No purulence suggested by diffusion imaging. No acute infarct, hydrocephalus, or worrisome mass effect. Enhancement along the margins of the postoperative collection are nonspecific and are most likely reactive given the recent  surgery timing. Vascular: Major flow voids and vascular enhancements are preserved Skull and upper cervical  spine: As above Sinuses/Orbits: Unremarkable MRI CERVICAL SPINE FINDINGS Alignment: Physiologic. Vertebrae: No fracture, evidence of discitis, or bone lesion. Cord: Normal signal and morphology. Posterior Fossa, vertebral arteries, paraspinal tissues: No visible inflammation or mass Disc levels: No significant degenerative change. Certainly no neural impingement IMPRESSION: Brain MRI: 1. 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 No purulence suggested by diffusion imaging, sterility is indeterminate in the setting of fever. 2. Small volume residual tumor along the fovea ethmoidalis and olfactory recess. Cervical MRI: Unremarkable. Electronically Signed   By: Jorje Guild M.D.   On: 03/11/2023 10:49   CT ABDOMEN PELVIS W CONTRAST  Result Date: 03/09/2023 CLINICAL DATA:  Abdominal pain. EXAM: CT ABDOMEN AND PELVIS WITH CONTRAST TECHNIQUE: Multidetector CT imaging of the abdomen and pelvis was performed using the standard protocol following bolus administration of intravenous contrast. RADIATION DOSE REDUCTION: This exam was performed according to the departmental dose-optimization program which includes automated exposure control, adjustment of the mA and/or kV according to patient size and/or use of iterative reconstruction technique. CONTRAST:  86mL OMNIPAQUE IOHEXOL 350 MG/ML SOLN COMPARISON:  12/08/2019. FINDINGS: Lower chest: Patchy atelectasis or infiltrate is noted in the lung bases. Hepatobiliary: There is redemonstration of a 2.8 cm hypodensity in the left lobe of the liver, previously characterized as hemangioma. A 9 mm hypodensity is present in the right lobe of the liver, possible cyst or hemangioma. No biliary ductal dilatation. The gallbladder is surgically absent. Pancreas: Unremarkable. No  pancreatic ductal dilatation or surrounding inflammatory changes. Spleen: Normal in size without focal abnormality. Adrenals/Urinary Tract: No adrenal nodule or mass. The kidneys enhance symmetrically. Evaluation for renal calculus is limited due to excreted contrast at the renal pyramids. No hydroureteronephrosis. The bladder is unremarkable. Stomach/Bowel: Gastric surgical changes are noted. Appendix appears normal. No evidence of bowel wall thickening, distention, or inflammatory changes. No free air or pneumatosis. Vascular/Lymphatic: Aortic atherosclerosis. No enlarged abdominal or pelvic lymph nodes. Reproductive: Status post hysterectomy. No adnexal masses. Other: No abdominopelvic ascites. Musculoskeletal: Degenerative changes are present in the thoracolumbar spine. No acute osseous abnormality. IMPRESSION: 1. Patchy atelectasis or infiltrate at the lung bases bilaterally. 2. No other acute process in the abdomen and pelvis. Electronically Signed   By: Brett Fairy M.D.   On: 03/09/2023 22:50   CT Angio Chest PE W/Cm &/Or Wo Cm  Result Date: 03/09/2023 CLINICAL DATA:  Pulmonary embolism (PE) suspected, low to intermediate prob, positive D-dimer EXAM: CT ANGIOGRAPHY CHEST WITH CONTRAST TECHNIQUE: Multidetector CT imaging of the chest was performed using the standard protocol during bolus administration of intravenous contrast. Multiplanar CT image reconstructions and MIPs were obtained to evaluate the vascular anatomy. RADIATION DOSE REDUCTION: This exam was performed according to the departmental dose-optimization program which includes automated exposure control, adjustment of the mA and/or kV according to patient size and/or use of iterative reconstruction technique. CONTRAST:  28mL OMNIPAQUE IOHEXOL 350 MG/ML SOLN COMPARISON:  Chest XR, 03/09/2023.  CT chest, 06/22/2013. FINDINGS: Suboptimal evaluation, secondary to motion degradation. Cardiovascular: Satisfactory opacification of the pulmonary  arteries to the segmental level. POSITIVE segmental pulmonary emboli within the LEFT inferior lobar artery. See key image. Normal heart size. No pericardial effusion.  RV/LV ratio 1.0 Mediastinum/Nodes: No enlarged mediastinal, hilar, or axillary lymph nodes. Thyroid gland, trachea, and esophagus demonstrate no significant findings. Lungs/Pleura: Pulmonary hypoinflation with bibasilar dependent and linear consolidations consistent with atelectasis. No pleural effusion or pneumothorax. Upper Abdomen: No acute abnormality. Surgical changes  of gastric bypass. Musculoskeletal: No chest wall abnormality. No acute or significant osseous findings. Review of the MIP images confirms the above findings. IMPRESSION: Examination is POSITIVE for segmental pulmonary emboli within the LEFT inferior lobe. No CT evidence of RIGHT heart strain, with RV/LV ratio 1.0. Electronically Signed   By: Michaelle Birks M.D.   On: 03/09/2023 18:15   DG Chest 2 View  Result Date: 03/09/2023 CLINICAL DATA:  Provided history: Pain in right ribs.  Chest pain. EXAM: CHEST - 2 VIEW COMPARISON:  Prior chest radiographs 10/04/2022 and earlier FINDINGS: Shallow inspiration radiograph, accentuating the cardiac silhouette and limiting evaluation of heart size. Ill-defined opacities within the right lung base. No appreciable airspace consolidation on the left. No evidence of pleural effusion or pneumothorax. No acute osseous abnormality identified. IMPRESSION: 1. Shallow inspiration radiograph. 2. Ill-defined opacities within the right lung base, which may reflect atelectasis or pneumonia. 3. No acute osseous abnormality is identified. Dedicated radiographs of the right ribs may be obtained for further evaluation, as clinically warranted. Electronically Signed   By: Kellie Simmering D.O.   On: 03/09/2023 15:53   MR BRAIN WO CONTRAST  Result Date: 02/22/2023 CLINICAL DATA:  Headache, neuro deficit. EXAM: MRI HEAD WITHOUT CONTRAST TECHNIQUE: Multiplanar,  multiecho pulse sequences of the brain and surrounding structures were obtained without intravenous contrast. COMPARISON:  Head CT February 21, 2023; MRI of the brain February 08, 2019. 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 Area of encephalomalacia and gliosis in the inferior left frontal lobe with peripheral restricted diffusion consistent with expected marginal ischemia. A fluid collection within the inferior aspect of the left middle cranial fossa, likely subdural, at the site of tumor resection with associated susceptibility artifact related blood products/pneumocephalus measures approximately 46 x 45 x 23 mm, mildly enlarged compared to prior MRI but stable since recent CT. An epidural fluid collection subjacent to the craniotomy now measures 9 mm, unchanged. This epidural collection communicates through the craniotomy to a larger left frontal scalp collection that measures 18 mm in thickness, compared to approximately 14 mm on prior CT. The epidural/subdural fluid collection resulting in mass effect on right frontal lobe with an approximately 4 mm rightward midline shift, stable. Enlarged, partially empty sella likely related to longstanding intracranial hypertension. Vascular: Mildly posteriorly displaced left MCA branches. Flow voids are maintained. Skull and upper cervical spine: Postsurgical changes from left pterional craniotomy. No focal marrow lesion identified. Sinuses/Orbits: Negative. Other: None. IMPRESSION: Stable postsurgical changes in the left frontal lobe with stable subdural/epidural fluid collection but mild increase of the left frontal scalp fluid collection, most consistent with a pseudomeningocele. Stable 4 mm rightward midline shift. Electronically Signed   By: Pedro Earls M.D.   On: 02/22/2023 12:57   CT HEAD WO CONTRAST (5MM)  Result Date: 02/21/2023 CLINICAL DATA:   Headache, increasing frequency or severity Recent meningioma resection, this AM with severe sharp bilateral Headache and difficulty with EOMI EXAM: CT HEAD WITHOUT CONTRAST TECHNIQUE: Contiguous axial images were obtained from the base of the skull through the vertex without intravenous contrast. RADIATION DOSE REDUCTION: This exam was performed according to the departmental dose-optimization program which includes automated exposure control, adjustment of the mA and/or kV according to patient size and/or use of iterative reconstruction technique. COMPARISON:  MRI February 08, 2023. FINDINGS: Brain: Postoperative changes of meningioma resection in the inferior left frontal lobe. There is extensive edema in this region with some  rightward midline shift (approximately 5 mm). Additionally there is an overlying extra-axial fluid collection which measures approximately 6 mm in thickness. No evidence of new/interval mass occupying acute hemorrhage or acute large vascular territory infarct. No hydrocephalus. Vascular: No hyperdense vessel identified. Skull: Left frontal craniotomy. Overlying scalp edema/fluid collection. Sinuses/Orbits: Clear sinuses.  No acute orbital findings. Other: No mastoid effusions. IMPRESSION: 1. Findings of recent meningioma resection with edema in the resection cavity, locules of gas, approximately 5 mm of midline shift, and approximately 6 mm thick overlying extra-axial fluid collection. Overall, findings do not appear substantially changed relative to recent MRI; however, direct comparison is limited by CT. An MRI with contrast could better assess for any new/interval complication if clinically warranted. 2. Increasing edema/fluid overlying the left frontal craniotomy. Findings are sterility indeterminate by imaging. Electronically Signed   By: Margaretha Sheffield M.D.   On: 02/21/2023 10:39   (Echo, Carotid, EGD, Colonoscopy, ERCP)    Subjective: No compalins  Discharge Exam: Vitals:    03/18/23 0658 03/18/23 1030  BP: (!) 108/51 115/63  Pulse: 94 90  Resp: 18 18  Temp: 99.3 F (37.4 C) 99.8 F (37.7 C)  SpO2: 99% 100%   Vitals:   03/17/23 2050 03/17/23 2335 03/18/23 0658 03/18/23 1030  BP: 125/70 (!) 96/59 (!) 108/51 115/63  Pulse: 100 92 94 90  Resp: 19 18 18 18   Temp: (!) 101.1 F (38.4 C) 99 F (37.2 C) 99.3 F (37.4 C) 99.8 F (37.7 C)  TempSrc: Oral Oral Oral Oral  SpO2: 99% 98% 99% 100%  Weight:      Height:        General: Pt is alert, awake, not in acute distress Cardiovascular: RRR, S1/S2 +, no rubs, no gallops Respiratory: CTA bilaterally, no wheezing, no rhonchi Abdominal: Soft, NT, ND, bowel sounds + Extremities: no edema, no cyanosis    The results of significant diagnostics from this hospitalization (including imaging, microbiology, ancillary and laboratory) are listed below for reference.     Microbiology: Recent Results (from the past 240 hour(s))  Resp panel by RT-PCR (RSV, Flu A&B, Covid) Anterior Nasal Swab     Status: None   Collection Time: 03/11/23  7:09 AM   Specimen: Anterior Nasal Swab  Result Value Ref Range Status   SARS Coronavirus 2 by RT PCR NEGATIVE NEGATIVE Final   Influenza A by PCR NEGATIVE NEGATIVE Final   Influenza B by PCR NEGATIVE NEGATIVE Final    Comment: (NOTE) The Xpert Xpress SARS-CoV-2/FLU/RSV plus assay is intended as an aid in the diagnosis of influenza from Nasopharyngeal swab specimens and should not be used as a sole basis for treatment. Nasal washings and aspirates are unacceptable for Xpert Xpress SARS-CoV-2/FLU/RSV testing.  Fact Sheet for Patients: EntrepreneurPulse.com.au  Fact Sheet for Healthcare Providers: IncredibleEmployment.be  This test is not yet approved or cleared by the [place] FDA and has been authorized for detection and/or diagnosis of SARS-CoV-2 by FDA under an Emergency Use Authorization (EUA). This EUA will remain in effect  (meaning this test can be used) for the duration of the COVID-19 declaration under Section 564(b)(1) of the Act, 21 U.S.C. section 360bbb-3(b)(1), unless the authorization is terminated or revoked.     Resp Syncytial Virus by PCR NEGATIVE NEGATIVE Final    Comment: (NOTE) Fact Sheet for Patients: EntrepreneurPulse.com.au  Fact Sheet for Healthcare Providers: IncredibleEmployment.be  This test is not yet approved or cleared by the [place] FDA and has been authorized for detection and/or diagnosis  of SARS-CoV-2 by FDA under an Emergency Use Authorization (EUA). This EUA will remain in effect (meaning this test can be used) for the duration of the COVID-19 declaration under Section 564(b)(1) of the Act, 21 U.S.C. section 360bbb-3(b)(1), unless the authorization is terminated or revoked.  Performed at [facility], [place] [address], [place], [place] [address]   Culture, blood (Routine X 2) w Reflex to ID Panel     Status: None   Collection Time: 03/11/23  7:50 AM   Specimen: BLOOD  Result Value Ref Range Status   Specimen Description BLOOD RIGHT ANTECUBITAL  Final   Special Requests   Final    BOTTLES DRAWN AEROBIC AND ANAEROBIC Blood Culture adequate volume   Culture   Final    NO GROWTH 5 DAYS Performed at [facility], [place] [address], [place], [place] [address]    Report Status 03/16/2023 FINAL  Final  Culture, blood (Routine X 2) w Reflex to ID Panel     Status: None   Collection Time: 03/11/23 11:30 AM   Specimen: BLOOD RIGHT ARM  Result Value Ref Range Status   Specimen Description BLOOD RIGHT ARM  Final   Special Requests   Final    BOTTLES DRAWN AEROBIC ONLY Blood Culture results may not be optimal due to an inadequate volume of blood received in culture bottles   Culture   Final    NO GROWTH 5 DAYS Performed at [facility], [place] [address], [place], [place] [address]    Report Status 03/16/2023 FINAL  Final   MRSA Next Gen by PCR, Nasal     Status: None   Collection Time: 03/11/23  6:00 PM   Specimen: Nasal Mucosa; Nasal Swab  Result Value Ref Range Status   MRSA by PCR Next Gen NOT DETECTED NOT DETECTED Final    Comment: (NOTE) The GeneXpert MRSA Assay (FDA approved for NASAL specimens only), is one component of a comprehensive MRSA colonization surveillance program. It is not intended to diagnose MRSA infection nor to guide or monitor treatment for MRSA infections. Test performance is not FDA approved in patients less than 7 years old. Performed at [facility], [place] [address] [place], [place] [address]   CSF culture w Gram Stain     Status: None   Collection Time: 03/15/23  7:17 AM   Specimen: CSF; Cerebrospinal Fluid  Result Value Ref Range Status   Specimen Description CSF  Final   Special Requests LP  Final   Gram Stain   Final    WBC PRESENT,BOTH PMN AND MONONUCLEAR NO ORGANISMS SEEN CYTOSPIN SMEAR    Culture   Final    NO GROWTH 3 DAYS Performed at [facility], [place] [address] [place], [place] [address]    Report Status 03/18/2023 FINAL  Final  Respiratory (~20 pathogens) panel by PCR     Status: None   Collection Time: 03/15/23 10:55 AM   Specimen: Nasopharyngeal Swab; Respiratory  Result Value Ref Range Status   Adenovirus NOT DETECTED NOT DETECTED Final   Coronavirus 229E NOT DETECTED NOT DETECTED Final    Comment: (NOTE) The Coronavirus on the Respiratory Panel, DOES NOT test for the novel  Coronavirus (2019 nCoV)    Coronavirus HKU1 NOT DETECTED NOT DETECTED Final   Coronavirus NL63 NOT DETECTED NOT DETECTED Final   Coronavirus OC43 NOT DETECTED NOT DETECTED Final   Metapneumovirus NOT DETECTED NOT DETECTED Final   Rhinovirus / Enterovirus  NOT DETECTED NOT DETECTED Final   Influenza A NOT DETECTED NOT DETECTED Final   Influenza B NOT DETECTED NOT DETECTED Final   Parainfluenza Virus 1 NOT DETECTED NOT DETECTED Final   Parainfluenza Virus 2 NOT  DETECTED NOT DETECTED Final   Parainfluenza Virus 3 NOT DETECTED NOT DETECTED Final   Parainfluenza Virus 4 NOT DETECTED NOT DETECTED Final   Respiratory Syncytial Virus NOT DETECTED NOT DETECTED Final   Bordetella pertussis NOT DETECTED NOT DETECTED Final   Bordetella Parapertussis NOT DETECTED NOT DETECTED Final   Chlamydophila pneumoniae NOT DETECTED NOT DETECTED Final   Mycoplasma pneumoniae NOT DETECTED NOT DETECTED Final    Comment: Performed at [facility], [place] [address], [place], [place] [address]     Labs: BNP (last 3 results) Recent Labs    10/04/22 2039 03/09/23 1534  BNP 17.0 [id number]   Basic Metabolic Panel: Recent Labs  Lab 03/12/23 0320 03/13/23 0043 03/14/23 0122  NA 132* 131* 133*  K 3.6 3.7 3.8  CL 102 103 102  CO2 20* 21* 23  GLUCOSE 105* 104* 94  BUN 10 7 6   CREATININE 0.78 0.70 0.72  CALCIUM 8.4* 8.5* 8.5*  MG 2.0 2.1 2.2   Liver Function Tests: Recent Labs  Lab 03/12/23 0320 03/13/23 0043  AST 13* 22  ALT 11 16  ALKPHOS 53 54  BILITOT 0.4 0.4  PROT 6.5 6.2*  ALBUMIN 2.6* 2.3*   No results for input(s): "LIPASE", "AMYLASE" in the last 168 hours. No results for input(s): "AMMONIA" in the last 168 hours. CBC: Recent Labs  Lab 03/12/23 0320 03/13/23 0043 03/14/23 0122  WBC 6.8 6.3 6.7  HGB 8.8* 7.9* 8.2*  HCT 27.0* 23.8* 25.5*  MCV 80.8 79.6* 80.4  PLT 311 309 331   Cardiac Enzymes: No results for input(s): "CKTOTAL", "CKMB", "CKMBINDEX", "TROPONINI" in the last 168 hours. BNP: Invalid input(s): "POCBNP" CBG: No results for input(s): "GLUCAP" in the last 168 hours. D-Dimer No results for input(s): "DDIMER" in the last 72 hours. Hgb A1c No results for input(s): "HGBA1C" in the last 72 hours. Lipid Profile No results for input(s): "CHOL", "HDL", "LDLCALC", "TRIG", "CHOLHDL", "LDLDIRECT" in the last 72 hours. Thyroid function studies No results for input(s): "TSH", "T4TOTAL", "T3FREE", "THYROIDAB" in the last 72  hours.  Invalid input(s): "FREET3" Anemia work up No results for input(s): "VITAMINB12", "FOLATE", "FERRITIN", "TIBC", "IRON", "RETICCTPCT" in the last 72 hours. Urinalysis    Component Value Date/Time   COLORURINE YELLOW 12/20/2022 [place] 12/20/2022 1539   LABSPEC 1.025 12/20/2022 1539   PHURINE 6.0 12/20/2022 1539   GLUCOSEU NEGATIVE 12/20/2022 1539   HGBUR NEGATIVE 12/20/2022 1539   BILIRUBINUR NEGATIVE 12/20/2022 1539   KETONESUR NEGATIVE 12/20/2022 1539   PROTEINUR NEGATIVE 12/20/2022 1539   UROBILINOGEN 1.0 09/21/2015 1735   NITRITE NEGATIVE 12/20/2022 1539   LEUKOCYTESUR NEGATIVE 12/20/2022 1539   Sepsis Labs Recent Labs  Lab 03/12/23 0320 03/13/23 0043 03/14/23 0122  WBC 6.8 6.3 6.7   Microbiology Recent Results (from the past 240 hour(s))  Resp panel by RT-PCR (RSV, Flu A&B, Covid) Anterior Nasal Swab     Status: None   Collection Time: 03/11/23  7:09 AM   Specimen: Anterior Nasal Swab  Result Value Ref Range Status   SARS Coronavirus 2 by RT PCR NEGATIVE NEGATIVE Final   Influenza A by PCR NEGATIVE NEGATIVE Final   Influenza B by PCR NEGATIVE NEGATIVE Final    Comment: (NOTE) The Xpert Xpress SARS-CoV-2/FLU/RSV  plus assay is intended as an aid in the diagnosis of influenza from Nasopharyngeal swab specimens and should not be used as a sole basis for treatment. Nasal washings and aspirates are unacceptable for Xpert Xpress SARS-CoV-2/FLU/RSV testing.  Fact Sheet for Patients: EntrepreneurPulse.com.au  Fact Sheet for Healthcare Providers: IncredibleEmployment.be  This test is not yet approved or cleared by the [place] FDA and has been authorized for detection and/or diagnosis of SARS-CoV-2 by FDA under an Emergency Use Authorization (EUA). This EUA will remain in effect (meaning this test can be used) for the duration of the COVID-19 declaration under Section 564(b)(1) of the Act, 21  U.S.C. section 360bbb-3(b)(1), unless the authorization is terminated or revoked.     Resp Syncytial Virus by PCR NEGATIVE NEGATIVE Final    Comment: (NOTE) Fact Sheet for Patients: EntrepreneurPulse.com.au  Fact Sheet for Healthcare Providers: IncredibleEmployment.be  This test is not yet approved or cleared by the [place] FDA and has been authorized for detection and/or diagnosis of SARS-CoV-2 by FDA under an Emergency Use Authorization (EUA). This EUA will remain in effect (meaning this test can be used) for the duration of the COVID-19 declaration under Section 564(b)(1) of the Act, 21 U.S.C. section 360bbb-3(b)(1), unless the authorization is terminated or revoked.  Performed at [facility], [place] [address], [place], [place] [address]   Culture, blood (Routine X 2) w Reflex to ID Panel     Status: None   Collection Time: 03/11/23  7:50 AM   Specimen: BLOOD  Result Value Ref Range Status   Specimen Description BLOOD RIGHT ANTECUBITAL  Final   Special Requests   Final    BOTTLES DRAWN AEROBIC AND ANAEROBIC Blood Culture adequate volume   Culture   Final    NO GROWTH 5 DAYS Performed at [facility], [place] [address] [place], [place] [address]    Report Status 03/16/2023 FINAL  Final  Culture, blood (Routine X 2) w Reflex to ID Panel     Status: None   Collection Time: 03/11/23 11:30 AM   Specimen: BLOOD RIGHT ARM  Result Value Ref Range Status   Specimen Description BLOOD RIGHT ARM  Final   Special Requests   Final    BOTTLES DRAWN AEROBIC ONLY Blood Culture results may not be optimal due to an inadequate volume of blood received in culture bottles   Culture   Final    NO GROWTH 5 DAYS Performed at [facility], [place] [address], [place], [place] [address]    Report Status 03/16/2023 FINAL  Final  MRSA Next Gen by PCR, Nasal     Status: None   Collection Time: 03/11/23  6:00 PM   Specimen: Nasal Mucosa; Nasal  Swab  Result Value Ref Range Status   MRSA by PCR Next Gen NOT DETECTED NOT DETECTED Final    Comment: (NOTE) The GeneXpert MRSA Assay (FDA approved for NASAL specimens only), is one component of a comprehensive MRSA colonization surveillance program. It is not intended to diagnose MRSA infection nor to guide or monitor treatment for MRSA infections. Test performance is not FDA approved in patients less than 52 years old. Performed at [facility], [place] [address], [place],  [address]   CSF culture w Gram Stain     Status: None   Collection Time: 03/15/23  7:17 AM   Specimen: CSF; Cerebrospinal Fluid  Result Value Ref Range Status   Specimen Description CSF  Final   Special  Requests LP  Final   Gram Stain   Final    WBC PRESENT,BOTH PMN AND MONONUCLEAR NO ORGANISMS SEEN CYTOSPIN SMEAR    Culture   Final    NO GROWTH 3 DAYS Performed at [facility], [address] [place], [place] [address]    Report Status 03/18/2023 FINAL  Final  Respiratory (~20 pathogens) panel by PCR     Status: None   Collection Time: 03/15/23 10:55 AM   Specimen: Nasopharyngeal Swab; Respiratory  Result Value Ref Range Status   Adenovirus NOT DETECTED NOT DETECTED Final   Coronavirus 229E NOT DETECTED NOT DETECTED Final    Comment: (NOTE) The Coronavirus on the Respiratory Panel, DOES NOT test for the novel  Coronavirus (2019 nCoV)    Coronavirus HKU1 NOT DETECTED NOT DETECTED Final   Coronavirus NL63 NOT DETECTED NOT DETECTED Final   Coronavirus OC43 NOT DETECTED NOT DETECTED Final   Metapneumovirus NOT DETECTED NOT DETECTED Final   Rhinovirus / Enterovirus NOT DETECTED NOT DETECTED Final   Influenza A NOT DETECTED NOT DETECTED Final   Influenza B NOT DETECTED NOT DETECTED Final   Parainfluenza Virus 1 NOT DETECTED NOT DETECTED Final   Parainfluenza Virus 2 NOT DETECTED NOT DETECTED Final   Parainfluenza Virus 3 NOT DETECTED NOT DETECTED Final   Parainfluenza Virus 4 NOT  DETECTED NOT DETECTED Final   Respiratory Syncytial Virus NOT DETECTED NOT DETECTED Final   Bordetella pertussis NOT DETECTED NOT DETECTED Final   Bordetella Parapertussis NOT DETECTED NOT DETECTED Final   Chlamydophila pneumoniae NOT DETECTED NOT DETECTED Final   Mycoplasma pneumoniae NOT DETECTED NOT DETECTED Final    Comment: Performed at [facility], [place]. [address] [place], [place] [address]     SIGNED:   Charlynne Cousins, MD  [facility] 03/18/2023, 12:16 PM Pager   If 7PM-7AM, please contact night-coverage [email or web address] Password [email or web address]

## 2023-03-18 NOTE — Plan of Care (Signed)
  Problem: Education: Goal: Knowledge of the prescribed therapeutic regimen will improve Outcome: Adequate for Discharge   Problem: Clinical Measurements: Goal: Usual level of consciousness will be regained or maintained. Outcome: Adequate for Discharge Goal: Neurologic status will improve Outcome: Adequate for Discharge Goal: Ability to maintain intracranial pressure will improve Outcome: Adequate for Discharge   Problem: Skin Integrity: Goal: Demonstration of wound healing without infection will improve Outcome: Adequate for Discharge   Problem: Education: Goal: Knowledge of General Education information will improve Description: Including pain rating scale, medication(s)/side effects and non-pharmacologic comfort measures Outcome: Adequate for Discharge   Problem: Health Behavior/Discharge Planning: Goal: Ability to manage health-related needs will improve Outcome: Adequate for Discharge   Problem: Clinical Measurements: Goal: Ability to maintain clinical measurements within normal limits will improve Outcome: Adequate for Discharge Goal: Will remain free from infection Outcome: Adequate for Discharge Goal: Diagnostic test results will improve Outcome: Adequate for Discharge Goal: Respiratory complications will improve Outcome: Adequate for Discharge Goal: Cardiovascular complication will be avoided Outcome: Adequate for Discharge   Problem: Activity: Goal: Risk for activity intolerance will decrease Outcome: Adequate for Discharge   Problem: Nutrition: Goal: Adequate nutrition will be maintained Outcome: Adequate for Discharge   Problem: Coping: Goal: Level of anxiety will decrease Outcome: Adequate for Discharge   Problem: Elimination: Goal: Will not experience complications related to bowel motility Outcome: Adequate for Discharge Goal: Will not experience complications related to urinary retention Outcome: Adequate for Discharge   Problem: Pain  Managment: Goal: General experience of comfort will improve Outcome: Adequate for Discharge   Problem: Safety: Goal: Ability to remain free from injury will improve Outcome: Adequate for Discharge   Problem: Skin Integrity: Goal: Risk for impaired skin integrity will decrease Outcome: Adequate for Discharge   

## 2023-03-18 NOTE — Progress Notes (Signed)
Discharge instructions (including medications) discussed with and copy provided to patient/caregiver 

## 2023-03-19 ENCOUNTER — Ambulatory Visit: Payer: Medicaid Other

## 2023-03-19 ENCOUNTER — Encounter: Payer: Self-pay | Admitting: *Deleted

## 2023-03-19 LAB — PATHOLOGIST SMEAR REVIEW

## 2023-03-19 NOTE — Transitions of Care (Post Inpatient/ED Visit) (Unsigned)
   03/19/2023  Name: Evelyn [name] MRN: [id number] DOB: 03-27-1971  Chart reviewed for TOC. Patient hospitalized for acute PE and discharged on 03/18/23. Patient's PCP is a [facility] provider and they have a Managed Medicaid plan. Per protocol, patient not outreached by Care Management Team for TOC.   Chong Sicilian, BSN, RN-BC RN Care Coordinator [place] Direct Dial: [phone] Main #: [phone]

## 2023-03-20 ENCOUNTER — Ambulatory Visit: Payer: Medicaid Other

## 2023-03-21 ENCOUNTER — Ambulatory Visit: Payer: Medicaid Other

## 2023-03-22 ENCOUNTER — Ambulatory Visit: Payer: Medicaid Other

## 2023-03-23 ENCOUNTER — Ambulatory Visit: Payer: Medicaid Other

## 2023-03-26 ENCOUNTER — Ambulatory Visit: Payer: 59

## 2023-03-27 ENCOUNTER — Ambulatory Visit: Payer: 59

## 2023-03-28 ENCOUNTER — Ambulatory Visit
Admission: RE | Admit: 2023-03-28 | Discharge: 2023-03-28 | Disposition: A | Discharge status: Home / Self Care | Payer: 59 | Source: Ambulatory Visit | Attending: Radiation Oncology | Admitting: Radiation Oncology

## 2023-03-28 ENCOUNTER — Other Ambulatory Visit: Payer: Self-pay

## 2023-03-28 DIAGNOSIS — D320 Benign neoplasm of cerebral meninges: Secondary | ICD-10-CM | POA: Diagnosis not present

## 2023-03-28 DIAGNOSIS — D329 Benign neoplasm of meninges, unspecified: Secondary | ICD-10-CM

## 2023-03-28 DIAGNOSIS — D496 Neoplasm of unspecified behavior of brain: Secondary | ICD-10-CM | POA: Diagnosis not present

## 2023-03-28 DIAGNOSIS — Z510 Encounter for antineoplastic radiation therapy: Secondary | ICD-10-CM | POA: Diagnosis not present

## 2023-03-28 LAB — RAD ONC ARIA SESSION SUMMARY
Course Elapsed Days: 0
Plan Fractions Treated to Date: 1
Plan Prescribed Dose Per Fraction: 1.8 Gy
Plan Total Fractions Prescribed: 31
Plan Total Prescribed Dose: 55.8 Gy
Reference Point Dosage Given to Date: 1.8 Gy
Reference Point Session Dosage Given: 1.8 Gy
Session Number: 1

## 2023-03-29 ENCOUNTER — Ambulatory Visit
Admission: RE | Admit: 2023-03-29 | Discharge: 2023-03-29 | Disposition: A | Discharge status: Home / Self Care | Payer: 59 | Source: Ambulatory Visit | Attending: Radiation Oncology | Admitting: Radiation Oncology

## 2023-03-29 ENCOUNTER — Other Ambulatory Visit: Payer: Self-pay

## 2023-03-29 ENCOUNTER — Telehealth: Payer: Self-pay

## 2023-03-29 DIAGNOSIS — D329 Benign neoplasm of meninges, unspecified: Secondary | ICD-10-CM

## 2023-03-29 DIAGNOSIS — Z510 Encounter for antineoplastic radiation therapy: Secondary | ICD-10-CM | POA: Diagnosis not present

## 2023-03-29 DIAGNOSIS — D320 Benign neoplasm of cerebral meninges: Secondary | ICD-10-CM | POA: Diagnosis not present

## 2023-03-29 DIAGNOSIS — D496 Neoplasm of unspecified behavior of brain: Secondary | ICD-10-CM | POA: Diagnosis not present

## 2023-03-29 LAB — RAD ONC ARIA SESSION SUMMARY
Course Elapsed Days: 1
Plan Fractions Treated to Date: 2
Plan Prescribed Dose Per Fraction: 1.8 Gy
Plan Total Fractions Prescribed: 31
Plan Total Prescribed Dose: 55.8 Gy
Reference Point Dosage Given to Date: 3.6 Gy
Reference Point Session Dosage Given: 1.8 Gy
Session Number: 2

## 2023-03-29 LAB — TSH: TSH: 1.684 u[IU]/mL (ref 0.350–4.500)

## 2023-03-29 NOTE — Progress Notes (Signed)
TSH reordered per lab request. Original order already had been released and pt did not come for lab appointment.

## 2023-03-29 NOTE — Telephone Encounter (Signed)
RN Anderson [place] called pt to remind her of need to have her TSH drawn. Pt did not come yesterday (lab called RN to let us know). New order placed for TSH and lab seat placed by RN. Pt knows to come today before treatment to have this level drawn. Pt had no questions about this phone call.

## 2023-03-30 ENCOUNTER — Ambulatory Visit
Admission: RE | Admit: 2023-03-30 | Discharge: 2023-03-30 | Disposition: A | Discharge status: Home / Self Care | Payer: 59 | Source: Ambulatory Visit | Attending: Radiation Oncology | Admitting: Radiation Oncology

## 2023-03-30 ENCOUNTER — Other Ambulatory Visit: Payer: Self-pay

## 2023-03-30 DIAGNOSIS — D320 Benign neoplasm of cerebral meninges: Secondary | ICD-10-CM | POA: Diagnosis not present

## 2023-03-30 DIAGNOSIS — D329 Benign neoplasm of meninges, unspecified: Secondary | ICD-10-CM | POA: Diagnosis not present

## 2023-03-30 DIAGNOSIS — D496 Neoplasm of unspecified behavior of brain: Secondary | ICD-10-CM | POA: Diagnosis not present

## 2023-03-30 DIAGNOSIS — Z510 Encounter for antineoplastic radiation therapy: Secondary | ICD-10-CM | POA: Diagnosis not present

## 2023-03-30 LAB — RAD ONC ARIA SESSION SUMMARY
Course Elapsed Days: 2
Plan Fractions Treated to Date: 3
Plan Prescribed Dose Per Fraction: 1.8 Gy
Plan Total Fractions Prescribed: 31
Plan Total Prescribed Dose: 55.8 Gy
Reference Point Dosage Given to Date: 5.4 Gy
Reference Point Session Dosage Given: 1.8 Gy
Session Number: 3

## 2023-04-02 ENCOUNTER — Ambulatory Visit
Admission: RE | Admit: 2023-04-02 | Discharge: 2023-04-02 | Disposition: A | Discharge status: Home / Self Care | Payer: 59 | Source: Ambulatory Visit | Attending: Radiation Oncology | Admitting: Radiation Oncology

## 2023-04-02 ENCOUNTER — Other Ambulatory Visit: Payer: Self-pay

## 2023-04-02 DIAGNOSIS — D320 Benign neoplasm of cerebral meninges: Secondary | ICD-10-CM | POA: Diagnosis not present

## 2023-04-02 DIAGNOSIS — Z86711 Personal history of pulmonary embolism: Secondary | ICD-10-CM | POA: Diagnosis not present

## 2023-04-02 DIAGNOSIS — D329 Benign neoplasm of meninges, unspecified: Secondary | ICD-10-CM | POA: Diagnosis not present

## 2023-04-02 DIAGNOSIS — Z510 Encounter for antineoplastic radiation therapy: Secondary | ICD-10-CM | POA: Diagnosis not present

## 2023-04-02 DIAGNOSIS — F419 Anxiety disorder, unspecified: Secondary | ICD-10-CM | POA: Diagnosis not present

## 2023-04-02 DIAGNOSIS — F331 Major depressive disorder, recurrent, moderate: Secondary | ICD-10-CM | POA: Diagnosis not present

## 2023-04-02 DIAGNOSIS — D496 Neoplasm of unspecified behavior of brain: Secondary | ICD-10-CM | POA: Diagnosis not present

## 2023-04-02 LAB — RAD ONC ARIA SESSION SUMMARY
Course Elapsed Days: 5
Plan Fractions Treated to Date: 4
Plan Prescribed Dose Per Fraction: 1.8 Gy
Plan Total Fractions Prescribed: 31
Plan Total Prescribed Dose: 55.8 Gy
Reference Point Dosage Given to Date: 7.2 Gy
Reference Point Session Dosage Given: 1.8 Gy
Session Number: 4

## 2023-04-03 ENCOUNTER — Other Ambulatory Visit: Payer: Self-pay

## 2023-04-03 ENCOUNTER — Ambulatory Visit
Admission: RE | Admit: 2023-04-03 | Discharge: 2023-04-03 | Disposition: A | Discharge status: Home / Self Care | Payer: 59 | Source: Ambulatory Visit | Attending: Radiation Oncology | Admitting: Radiation Oncology

## 2023-04-03 ENCOUNTER — Ambulatory Visit (INDEPENDENT_AMBULATORY_CARE_PROVIDER_SITE_OTHER): Payer: Medicaid Other | Admitting: Internal Medicine

## 2023-04-03 ENCOUNTER — Encounter: Payer: Self-pay | Admitting: Internal Medicine

## 2023-04-03 VITALS — BP 107/83 | HR 84 | Temp 98.5°F | Wt 273.8 lb

## 2023-04-03 DIAGNOSIS — R509 Fever, unspecified: Secondary | ICD-10-CM

## 2023-04-03 DIAGNOSIS — D496 Neoplasm of unspecified behavior of brain: Secondary | ICD-10-CM

## 2023-04-03 DIAGNOSIS — D320 Benign neoplasm of cerebral meninges: Secondary | ICD-10-CM | POA: Diagnosis not present

## 2023-04-03 DIAGNOSIS — D329 Benign neoplasm of meninges, unspecified: Secondary | ICD-10-CM | POA: Diagnosis not present

## 2023-04-03 DIAGNOSIS — Z510 Encounter for antineoplastic radiation therapy: Secondary | ICD-10-CM | POA: Diagnosis not present

## 2023-04-03 LAB — RAD ONC ARIA SESSION SUMMARY
Course Elapsed Days: 6
Plan Fractions Treated to Date: 5
Plan Prescribed Dose Per Fraction: 1.8 Gy
Plan Total Fractions Prescribed: 31
Plan Total Prescribed Dose: 55.8 Gy
Reference Point Dosage Given to Date: 9 Gy
Reference Point Session Dosage Given: 1.8 Gy
Session Number: 5

## 2023-04-03 MED ORDER — ALRA NON-METALLIC DEODORANT (RAD-ONC)
1.0000 | Freq: Once | TOPICAL | Status: AC
  Administered 2023-04-03: 1 via TOPICAL

## 2023-04-03 MED ORDER — RADIAPLEXRX EX GEL: Freq: Once | CUTANEOUS | Status: AC

## 2023-04-03 NOTE — Progress Notes (Signed)
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   Meningioma, cerebral 02/15/2023   Brain tumor 02/08/2023   Boil 03/15/2022   Patellofemoral arthritis of left knee 02/08/2018   Thiamine deficiency 10/23/2017   Vitamin D deficiency 10/23/2017   Anxiety 08/09/2017   H/O gastric bypass 05/22/2017   Postgastrectomy malabsorption 05/22/2017   Obesity, Class III, BMI 40-49.9 (morbid obesity) 04/16/2017   Chronic low back pain 04/06/2017   Essential hypertension 04/06/2017   Lumbar disc disease 01/16/2017   Other hyperlipidemia 01/16/2017   Prediabetes 01/16/2017   OSA (obstructive sleep apnea) 01/16/2017   Moderate episode of recurrent major depressive disorder 11/09/2016      Subjective:    Patient ID: Evelyn [name], female    DOB: Dec 18, 1971, 52 y.o.   MRN: [id number]  Chief Complaint  Patient presents with   Follow-up    HPI:  Evelyn SportsmanSonja Monique Chaudoin is a 52 y.o. female here for hospital f/u fever  Patient had meningioma resection 02/08/23 and developed fever 03/09/23 so admitted. Had dyspnea and found to have new PE placed on anticoagulation She had imaging fluid collection around surgical site and this was aspirated and was sterile and appears to be seroma. 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 So she had started radiation oncology already. No chemo planned at this time yet as this is stage 2 No fever since discharge  Feels well except fatigue from radiation therapy     Allergies  Allergen Reactions   Dilaudid [Hydromorphone Hcl] Nausea And Vomiting   Morphine And Related Other (See Comments)    Fever and chills and convulsions   Vicodin [Hydrocodone-Acetaminophen] Nausea And Vomiting    Dizziness       Outpatient Medications Prior to Visit  Medication Sig Dispense Refill   acetaminophen (TYLENOL) 500 MG tablet Take 1,000 mg by mouth in the morning and at bedtime.     butalbital-acetaminophen-caffeine (FIORICET) 50-325-40 MG tablet Take 2 tablets by mouth every 6 (six) hours as needed for headache (first line for headache).   Not to exceed 6 tablets per day 60 tablet 0   calcium carbonate (TUMS - DOSED IN MG ELEMENTAL CALCIUM) 500 MG chewable tablet Chew 1 tablet (200 mg of elemental calcium total) by mouth 3 (three) times daily. 30 tablet 0   dextromethorphan-guaiFENesin (MUCINEX DM) 30-600 MG 12hr tablet Take 1 tablet by mouth 2 (two) times daily. 15 tablet 0   famotidine (PEPCID) 20 MG tablet Take 1 tablet (20 mg total) by mouth 2 (two) times daily. 60 tablet 0   levETIRAcetam (KEPPRA) 750 MG tablet Take 1 tablet (750 mg total) by mouth 2 (two) times daily. 60 tablet 0   methocarbamol (ROBAXIN) 500 MG tablet Take 1 tablet (500 mg total) by mouth every 6 (six) hours as needed for muscle spasms. 60  tablet 1   rivaroxaban (XARELTO) 20 MG TABS tablet Take 1 tablet (20 mg total) by mouth daily with supper. 30 tablet 1   topiramate (TOPAMAX) 50 MG tablet Take 1 tablet (50 mg total) by mouth 2 (two) times daily. 60 tablet 1   Vitamin D, Ergocalciferol, (DRISDOL) 1.25 MG (50000 UNIT) CAPS capsule Take 1 capsule (50,000 Units total) by mouth once a week. 5 capsule 0   DULoxetine (CYMBALTA) 30 MG capsule Take 2 capsules (60mg ) by mouth daily at 8:00am and 1  capsule (30mg ) nightly at 10:00pm (Patient not taking: Reported on 04/03/2023) 90 capsule 0   nystatin (MYCOSTATIN/NYSTOP) powder Apply to affected area 3 times daily (Patient not taking: Reported on 04/03/2023)     nystatin cream (MYCOSTATIN) Apply topically 2 (two) times daily. (Patient not taking: Reported on 04/03/2023)     polyethylene glycol (MIRALAX / GLYCOLAX) 17 g packet Take 17 g by mouth 2 (two) times daily. (Patient not taking: Reported on 03/10/2023) 14 each 0   Rivaroxaban (XARELTO) 15 MG TABS tablet Take 1 tablet (15 mg total) by mouth 2 (two) times daily with a meal for 12 days. (Patient not taking: Reported on 04/03/2023) 24 tablet 0   No facility-administered medications prior to visit.     Social History   Socioeconomic History   Marital status: Single    Spouse name: Not on file   Number of children: Not on file   Years of education: Not on file   Highest education level: Not on file  Occupational History   Not on file  Tobacco Use   Smoking status: Never   Smokeless tobacco: Never  Vaping Use   Vaping Use: Never used  Substance and Sexual Activity   Alcohol use: No   Drug use: No   Sexual activity: Yes    Birth control/protection: Surgical  Other Topics Concern   Not on file  Social History Narrative   Not on file   Social Determinants of Health   Financial Resource Strain: Not on file  Food Insecurity: No Food Insecurity (03/10/2023)   Hunger Vital Sign    Worried About Running Out of Food in the Last Year: Never true    Ran Out of Food in the Last Year: Never true  Transportation Needs: No Transportation Needs (03/10/2023)   PRAPARE - Administrator, Civil Service (Medical): No    Lack of Transportation (Non-Medical): No  Physical Activity: Not on file  Stress: Not on file  Social Connections: Not on file  Intimate Partner Violence: Not At Risk (03/10/2023)   Humiliation, Afraid, Rape, and Kick questionnaire    Fear of Current or Ex-Partner: No     Emotionally Abused: No    Physically Abused: No    Sexually Abused: No      Review of Systems    All other ros negative  Objective:    BP 107/83   Pulse 84   Temp 98.5 F (36.9 C) (Oral)   Wt 273 lb 12.8 oz (124.2 kg)   SpO2 100%   BMI 42.88 kg/m  Nursing note and vital signs reviewed.  Physical Exam     General/constitutional: no distress, pleasant HEENT: Normocephalic, PER, Conj Clear, EOMI, Oropharynx clear Neck supple CV: rrr no mrg Lungs: clear to auscultation, normal respiratory effort Abd: Soft, Nontender Ext: no edema Skin: No Rash Neuro: nonfocal MSK: no peripheral joint swelling/tenderness/warmth; back spines nontender   Labs: Lab Results  Component Value Date  WBC 6.7 03/14/2023   HGB 8.2 (L) 03/14/2023   HCT 25.5 (L) 03/14/2023   MCV 80.4 03/14/2023   PLT 331 03/14/2023    Micro:  Serology:  Imaging:  Assessment & Plan:   Problem List Items Addressed This Visit       Other   Fever - Primary      No orders of the defined types were placed in this encounter.    Kameelah Fullen is a 52 y.o. female with newly diagnosed meningioma status post resection in February now admitted with symptoms of constipation flank pain found to have PE then had ongoing rigors and fevers despite being anticoagulated.   MRI of the brain was performed which shows a fluid collection that radiology is not a "slam dunk" for infection but obviously this is still possible   #FUO: #?nosocomial onset fever (she actually was discharged after surgery 2/15 but came back 3/15) She complained of allodynia lower back and mri 3/18 was negative 3/15 admission xray chest right lung base opacity atelectasis vs pna -- minimal dry cough 3/17 mri brain with operative site fluid collection -- s/p tap today 3/21 by NSG and fluid appears clear Flu/covid pcr negative Bcx negative PE new dx this admission, on anticoagulation. Fever still day 5 from anticoagulation No  other sign of suggestion of antiphospholipid process  Nothing to suggest drug fever      -f/u fluid analysis from surgical site aspiration: agree temporally most likely connection with fever is the surgical fluid collection although it appears rather clear. Case series of these craniotomy procedures with post op fever almost half of the time fever is unclear -send full respiratory pcr -it is a little early (seems she can tell when she have fever) but query if she has had fever at home without being awared, so will send ldh make sure it is not significantly high to suggest lymphoma (low on suspicion at this time though) -will continue to monitor for now before pursuing other diagnostics if fever continues -continues monitoring off abx  -discussed with primary team   ----------- 03/16/23 hospital assessment Continued isolated fever Resp viral pcr negative Csf cx negative and doesn't appear infected   Ldh is only 177   Once csf cx finalize probably ok to discharge with id f/u Thinking inflammatory fever in nature and will give another week for it to do its course. If still persistent fever after that will pursue a full fuo workup   I have made an id clinic f/u for her on 4/9 @ 330pm. If she is not able to make in person visit, can call clinic to do video visit   #PE   -------------------- 04/03/23 id clinic assessment Fever had resolved since hospital discharge Unfortunately the brain tumor is atypical meningioma stage 2 and she is getting radiation onc Her surgical site fluid collection seems to have resolved as well No need for f/u id clinic  Follow-up: No follow-ups on file.      Raymondo Band, MD [facility] 04/03/2023, 3:26 PM

## 2023-04-03 NOTE — Progress Notes (Addendum)
Pt here for patient teaching. Pt given Radiation and You booklet, skin care instructions, Alra deodorant, and Radiaplex gel. Reviewed areas of pertinence such as fatigue, hair loss, skin changes, breast tenderness, and breast swelling . Pt able to give teach back of to pat skin, use unscented/gentle soap, and drink plenty of water,apply Radiaplex bid, avoid applying anything to skin within 4 hours of treatment, avoid wearing an under wire bra, and to use an electric razor if they must shave. Pt verbalizes understanding of information given and will contact nursing with any questions or concerns.

## 2023-04-04 ENCOUNTER — Other Ambulatory Visit: Payer: Self-pay

## 2023-04-04 ENCOUNTER — Ambulatory Visit
Admission: RE | Admit: 2023-04-04 | Discharge: 2023-04-04 | Disposition: A | Discharge status: Home / Self Care | Payer: 59 | Source: Ambulatory Visit | Attending: Radiation Oncology | Admitting: Radiation Oncology

## 2023-04-04 DIAGNOSIS — D329 Benign neoplasm of meninges, unspecified: Secondary | ICD-10-CM | POA: Diagnosis not present

## 2023-04-04 DIAGNOSIS — Z510 Encounter for antineoplastic radiation therapy: Secondary | ICD-10-CM | POA: Diagnosis not present

## 2023-04-04 DIAGNOSIS — D320 Benign neoplasm of cerebral meninges: Secondary | ICD-10-CM | POA: Diagnosis not present

## 2023-04-04 DIAGNOSIS — D496 Neoplasm of unspecified behavior of brain: Secondary | ICD-10-CM | POA: Diagnosis not present

## 2023-04-04 LAB — RAD ONC ARIA SESSION SUMMARY
Course Elapsed Days: 7
Plan Fractions Treated to Date: 6
Plan Prescribed Dose Per Fraction: 1.8 Gy
Plan Total Fractions Prescribed: 31
Plan Total Prescribed Dose: 55.8 Gy
Reference Point Dosage Given to Date: 10.8 Gy
Reference Point Session Dosage Given: 1.8 Gy
Session Number: 6

## 2023-04-05 ENCOUNTER — Ambulatory Visit
Admission: RE | Admit: 2023-04-05 | Discharge: 2023-04-05 | Disposition: A | Discharge status: Home / Self Care | Payer: 59 | Source: Ambulatory Visit | Attending: Radiation Oncology | Admitting: Radiation Oncology

## 2023-04-05 ENCOUNTER — Other Ambulatory Visit: Payer: Self-pay

## 2023-04-05 DIAGNOSIS — D329 Benign neoplasm of meninges, unspecified: Secondary | ICD-10-CM | POA: Diagnosis not present

## 2023-04-05 DIAGNOSIS — D320 Benign neoplasm of cerebral meninges: Secondary | ICD-10-CM | POA: Diagnosis not present

## 2023-04-05 DIAGNOSIS — D496 Neoplasm of unspecified behavior of brain: Secondary | ICD-10-CM | POA: Diagnosis not present

## 2023-04-05 DIAGNOSIS — Z510 Encounter for antineoplastic radiation therapy: Secondary | ICD-10-CM | POA: Diagnosis not present

## 2023-04-05 LAB — RAD ONC ARIA SESSION SUMMARY
Course Elapsed Days: 8
Plan Fractions Treated to Date: 7
Plan Prescribed Dose Per Fraction: 1.8 Gy
Plan Total Fractions Prescribed: 31
Plan Total Prescribed Dose: 55.8 Gy
Reference Point Dosage Given to Date: 12.6 Gy
Reference Point Session Dosage Given: 1.8 Gy
Session Number: 7

## 2023-04-06 ENCOUNTER — Other Ambulatory Visit: Payer: Self-pay

## 2023-04-06 ENCOUNTER — Ambulatory Visit
Admission: RE | Admit: 2023-04-06 | Discharge: 2023-04-06 | Disposition: A | Discharge status: Home / Self Care | Payer: 59 | Source: Ambulatory Visit | Attending: Radiation Oncology | Admitting: Radiation Oncology

## 2023-04-06 DIAGNOSIS — D329 Benign neoplasm of meninges, unspecified: Secondary | ICD-10-CM | POA: Diagnosis not present

## 2023-04-06 DIAGNOSIS — D496 Neoplasm of unspecified behavior of brain: Secondary | ICD-10-CM | POA: Diagnosis not present

## 2023-04-06 DIAGNOSIS — Z510 Encounter for antineoplastic radiation therapy: Secondary | ICD-10-CM | POA: Diagnosis not present

## 2023-04-06 DIAGNOSIS — D320 Benign neoplasm of cerebral meninges: Secondary | ICD-10-CM | POA: Diagnosis not present

## 2023-04-06 LAB — RAD ONC ARIA SESSION SUMMARY
Course Elapsed Days: 9
Plan Fractions Treated to Date: 8
Plan Prescribed Dose Per Fraction: 1.8 Gy
Plan Total Fractions Prescribed: 31
Plan Total Prescribed Dose: 55.8 Gy
Reference Point Dosage Given to Date: 14.4 Gy
Reference Point Session Dosage Given: 1.8 Gy
Session Number: 8

## 2023-04-09 ENCOUNTER — Other Ambulatory Visit: Payer: Self-pay

## 2023-04-09 ENCOUNTER — Ambulatory Visit
Admission: RE | Admit: 2023-04-09 | Discharge: 2023-04-09 | Disposition: A | Discharge status: Home / Self Care | Payer: 59 | Source: Ambulatory Visit | Attending: Radiation Oncology | Admitting: Radiation Oncology

## 2023-04-09 DIAGNOSIS — D496 Neoplasm of unspecified behavior of brain: Secondary | ICD-10-CM | POA: Diagnosis not present

## 2023-04-09 DIAGNOSIS — Z510 Encounter for antineoplastic radiation therapy: Secondary | ICD-10-CM | POA: Diagnosis not present

## 2023-04-09 DIAGNOSIS — D329 Benign neoplasm of meninges, unspecified: Secondary | ICD-10-CM | POA: Diagnosis not present

## 2023-04-09 DIAGNOSIS — D320 Benign neoplasm of cerebral meninges: Secondary | ICD-10-CM | POA: Diagnosis not present

## 2023-04-09 LAB — RAD ONC ARIA SESSION SUMMARY
Course Elapsed Days: 12
Plan Fractions Treated to Date: 9
Plan Prescribed Dose Per Fraction: 1.8 Gy
Plan Total Fractions Prescribed: 31
Plan Total Prescribed Dose: 55.8 Gy
Reference Point Dosage Given to Date: 16.2 Gy
Reference Point Session Dosage Given: 1.8 Gy
Session Number: 9

## 2023-04-10 ENCOUNTER — Other Ambulatory Visit: Payer: Self-pay | Admitting: Genetic Counselor

## 2023-04-10 ENCOUNTER — Inpatient Hospital Stay: Payer: 59 | Attending: Neurological Surgery | Admitting: Genetic Counselor

## 2023-04-10 ENCOUNTER — Encounter: Payer: Self-pay | Admitting: Genetic Counselor

## 2023-04-10 ENCOUNTER — Other Ambulatory Visit: Payer: Medicaid Other

## 2023-04-10 ENCOUNTER — Other Ambulatory Visit: Payer: Self-pay

## 2023-04-10 ENCOUNTER — Ambulatory Visit
Admission: RE | Admit: 2023-04-10 | Discharge: 2023-04-10 | Disposition: A | Discharge status: Home / Self Care | Payer: 59 | Source: Ambulatory Visit | Attending: Radiation Oncology | Admitting: Radiation Oncology

## 2023-04-10 ENCOUNTER — Inpatient Hospital Stay: Payer: 59

## 2023-04-10 ENCOUNTER — Encounter: Payer: Medicaid Other | Admitting: Genetic Counselor

## 2023-04-10 DIAGNOSIS — Z800 Family history of malignant neoplasm of digestive organs: Secondary | ICD-10-CM

## 2023-04-10 DIAGNOSIS — D329 Benign neoplasm of meninges, unspecified: Secondary | ICD-10-CM | POA: Diagnosis not present

## 2023-04-10 DIAGNOSIS — Z803 Family history of malignant neoplasm of breast: Secondary | ICD-10-CM

## 2023-04-10 DIAGNOSIS — D496 Neoplasm of unspecified behavior of brain: Secondary | ICD-10-CM

## 2023-04-10 DIAGNOSIS — Z510 Encounter for antineoplastic radiation therapy: Secondary | ICD-10-CM | POA: Diagnosis not present

## 2023-04-10 DIAGNOSIS — D320 Benign neoplasm of cerebral meninges: Secondary | ICD-10-CM

## 2023-04-10 LAB — RAD ONC ARIA SESSION SUMMARY
Course Elapsed Days: 13
Plan Fractions Treated to Date: 10
Plan Prescribed Dose Per Fraction: 1.8 Gy
Plan Total Fractions Prescribed: 31
Plan Total Prescribed Dose: 55.8 Gy
Reference Point Dosage Given to Date: 18 Gy
Reference Point Session Dosage Given: 1.8 Gy
Session Number: 10

## 2023-04-10 NOTE — Progress Notes (Addendum)
REFERRING PROVIDER: Erven Colla, PA-C [address] [place],  [place] [address]  PRIMARY PROVIDER:  Bryon Lions, PA-C  PRIMARY REASON FOR VISIT:  1. Family history of breast cancer   2. Family history of colon cancer   3. Meningioma, cerebral      HISTORY OF PRESENT ILLNESS:   Evelyn [name], a 52 y.o. female, was seen for a [facility] cancer genetics consultation at the request of Bryan Lemma, PA-C due to a personal and family history of cancer.  Evelyn [name] presents to clinic today to discuss the possibility of a hereditary predisposition to cancer, genetic testing, and to further clarify her future cancer risks, as well as potential cancer risks for family members.   In February 2024, at the age of 26, Evelyn [name] was diagnosed with meningioma of the brain. The treatment plan radiation.   CANCER HISTORY:  Oncology History   No history exists.     RISK FACTORS:  Menarche was at age 81.  First live birth at age 59.  OCP use for approximately  >5  years.  Ovaries intact: no.  Hysterectomy: yes.  Menopausal status: postmenopausal.  HRT use: 0 years. Colonoscopy: yes; normal. Mammogram within the last year: yes. Number of breast biopsies: 0. Up to date with pelvic exams: yes. Any excessive radiation exposure in the past: no  Past Medical History:  Diagnosis Date   Anemia    Anxiety    Depression    Family history of breast cancer    Family history of colon cancer    GERD (gastroesophageal reflux disease)    Headache    Obesity    Seizures    Sleep apnea     Past Surgical History:  Procedure Laterality Date   ABDOMINAL HYSTERECTOMY     APPLICATION OF CRANIAL NAVIGATION N/A 02/08/2023   Procedure: APPLICATION OF CRANIAL NAVIGATION;  Surgeon: Jadene Pierini, MD;  Location: [facility] OR;  Service: Neurosurgery;  Laterality: N/A;   BREAST LUMPECTOMY Left 1997   CHOLECYSTECTOMY     CRANIOTOMY Left 02/08/2023   Procedure: Left craniotomy for tumor resection with stealth  navigation;  Surgeon: Jadene Pierini, MD;  Location: [facility] OR;  Service: Neurosurgery;  Laterality: Left;   GASTRIC BYPASS  03/2017   THROAT SURGERY     removed scar tissue from throat d/t being intubated.    TUBAL LIGATION      Social History   Socioeconomic History   Marital status: Single    Spouse name: Not on file   Number of children: Not on file   Years of education: Not on file   Highest education level: Not on file  Occupational History   Not on file  Tobacco Use   Smoking status: Never   Smokeless tobacco: Never  Vaping Use   Vaping Use: Never used  Substance and Sexual Activity   Alcohol use: No   Drug use: No   Sexual activity: Yes    Birth control/protection: Surgical  Other Topics Concern   Not on file  Social History Narrative   Not on file   Social Determinants of Health   Financial Resource Strain: Not on file  Food Insecurity: No Food Insecurity (03/10/2023)   Hunger Vital Sign    Worried About Running Out of Food in the Last Year: Never true    Ran Out of Food in the Last Year: Never true  Transportation Needs: No Transportation Needs (03/10/2023)   PRAPARE - Transportation  Lack of Transportation (Medical): No    Lack of Transportation (Non-Medical): No  Physical Activity: Not on file  Stress: Not on file  Social Connections: Not on file     FAMILY HISTORY:  We obtained a detailed, 4-generation family history.  Significant diagnoses are listed below: Family History  Problem Relation Age of Onset   Breast cancer Mother 33   Prostate cancer Father 73   Colon cancer Maternal Grandmother        d. 25   Prostate cancer Maternal Grandfather    Breast cancer Paternal Grandmother    Colon cancer Maternal Uncle 6   Cancer Maternal Uncle 60       NOS   Colon cancer Other        MGMs brother   Colon cancer Other        MGMs 2 sisters   Prostate cancer Other        PGFs 2 brothers   Brain cancer Paternal Uncle 15       Meningioma       The patient has three sons who are cancer free.  She has two brothers who are cancer free. Both parents are living.  The patient's mother was diagnosed with breast cancer at 72.  She underwent genetic testing in 2019 and it was negative.  She has three brothers and a sister.  One brother had colon cancer and another brother had an unknown cancer.  The maternal grandmother had colon cancer and died at 76.  The grandfather had prostate cancer.  The grandmother had two sisters and a brother with colon cancer and another sister with an unknown cancer.  The patients father had prostate cancer at 36.  He had a brother with meningioma and died at 74.  He has several other siblings who are cancer fee. His mother is 95 and had breast cancer.  His father did not have cancer, but he had two brothers with prostate cancer and a sister with breast cancer.  His mother also had cancer.  Evelyn [name] is aware of previous family history of genetic testing for hereditary cancer risks. Patient's maternal ancestors are of African American descent, and paternal ancestors are of African American descent. There is no reported Ashkenazi Jewish ancestry. There is no known consanguinity.  GENETIC COUNSELING ASSESSMENT: Evelyn [name] is a 52 y.o. female with a personal and family history of cancer which is somewhat suggestive of a hereditary cancer syndrome and predisposition to cancer given the combination of cancer on the paternal side of the family. We, therefore, discussed and recommended the following at today's visit.   DISCUSSION: We discussed that, in general, most cancer is not inherited in families, but instead is sporadic or familial. Sporadic cancers occur by chance and typically happen at older ages (>50 years) as this type of cancer is caused by genetic changes acquired during an individual's lifetime. Some families have more cancers than would be expected by chance; however, the ages or types of cancer are not  consistent with a known genetic mutation or known genetic mutations have been ruled out. This type of familial cancer is thought to be due to a combination of multiple genetic, environmental, hormonal, and lifestyle factors. While this combination of factors likely increases the risk of cancer, the exact source of this risk is not currently identifiable or testable.  We discussed that 5 - 10% of breast cancer and up to 15% of prostate cancer is hereditary, with most cases associated with BRCA  mutations.  There are other genes that can be associated with hereditary breast and prostate cancer syndromes.  These include ATM, CHEK2 and PALB2.  We discussed that testing is beneficial for several reasons including knowing how to follow individuals after completing their treatment, identifying whether potential treatment options such as PARP inhibitors would be beneficial, and understand if other family members could be at risk for cancer and allow them to undergo genetic testing.   We reviewed the characteristics, features and inheritance patterns of hereditary cancer syndromes. We also discussed genetic testing, including the appropriate family members to test, the process of testing, insurance coverage and turn-around-time for results. We discussed the implications of a negative, positive, carrier and/or variant of uncertain significant result. Evelyn [name]  was offered a common hereditary cancer panel (47 genes) and an expanded pan-cancer panel (77 genes). Evelyn [name] was informed of the benefits and limitations of each panel, including that expanded pan-cancer panels contain genes that do not have clear management guidelines at this point in time.  We also discussed that as the number of genes included on a panel increases, the chances of variants of uncertain significance increases. Evelyn [name] decided to pursue genetic testing for the CancerNext-Expanded+RNAinsight gene panel.   The CancerNext-Expanded gene panel  offered by [employer] and includes sequencing and rearrangement analysis for the following 71 genes: AIP, ALK, APC, ATM, BAP1, BARD1, BMPR1A, BRCA1, BRCA2, BRIP1, CDC73, CDH1, CDK4, CDKN1B, CDKN2A, CHEK2, DICER1, FH, FLCN, KIF1B, LZTR1, MAX, MEN1, MET, MLH1, MSH2, MSH6, MUTYH, NF1, NF2, NTHL1, PALB2, PHOX2B, PMS2, POT1, PRKAR1A, PTCH1, PTEN, RAD51C, RAD51D, RB1, RET, SDHA, SDHAF2, SDHB, SDHC, SDHD, SMAD4, SMARCA4, SMARCB1, SMARCE1, STK11, SUFU, TMEM127, TP53, TSC1, TSC2 and VHL (sequencing and deletion/duplication); AXIN2, CTNNA1, EGFR, EGLN1, HOXB13, KIT, MITF, MSH3, PDGFRA, POLD1 and POLE (sequencing only); EPCAM and GREM1 (deletion/duplication only). RNA data is routinely analyzed for use in variant interpretation for all genes.   Based on Evelyn [name]'s personal and family history of cancer, she meets medical criteria for genetic testing. Despite that she meets criteria, she may still have an out of pocket cost. We discussed that if her out of pocket cost for testing is over $100, the laboratory will call and confirm whether she wants to proceed with testing.  If the out of pocket cost of testing is less than $100 she will be billed by the genetic testing laboratory.   Based on the patient's personal and family history, a statistical model (Tyrer Cusik) was used to estimate her risk of developing breast cancer. This estimates her lifetime risk of developing breast cancer to be approximately 27%. This estimation does not consider any genetic testing results.  The patient's lifetime breast cancer risk is a preliminary estimate based on available information using one of several models endorsed by the American Cancer Society (ACS). The ACS recommends consideration of breast MRI screening as an adjunct to mammography for patients at high risk (defined as 20% or greater lifetime risk). Please note that a woman's breast cancer risk changes over time. It may increase or decrease based on age and any changes to the  personal and/or family medical history. The risks and recommendations listed above apply to this patient at this point in time. In the future, she may or may not be eligible for the same medical management strategies and, in some cases, other medical management strategies may become available to her. If she is interested in an updated breast cancer risk assessment at a later date, she can contact  us.  Evelyn [name] has been determined to be at high risk for breast cancer.  Therefore, we recommend that annual screening with mammography and breast MRI be performed.  We discussed that Evelyn [name] should discuss her individual situation with her referring physician and determine a breast cancer screening plan with which they are both comfortable.      PLAN: After considering the risks, benefits, and limitations, Evelyn [name] provided informed consent to pursue genetic testing and the blood sample was sent to [facility] for analysis of the CancerNext-Expanded+RNAinsight. Results should be available within approximately 2-3 weeks' time, at which point they will be disclosed by telephone to Evelyn [name], as will any additional recommendations warranted by these results. Evelyn [name] will receive a summary of her genetic counseling visit and a copy of her results once available. This information will also be available in Epic.   Lastly, we encouraged Evelyn [name] to remain in contact with cancer genetics annually so that we can continuously update the family history and inform her of any changes in cancer genetics and testing that may be of benefit for this family.   Evelyn [name] questions were answered to her satisfaction today. Our contact information was provided should additional questions or concerns arise. Thank you for the referral and allowing [place] to share in the care of your patient.   Evelyn [name] P. Lowell Guitar, MS, [facility] Licensed, Patent attorney [email or web address] phone: [phone]  The  patient was seen for a total of 35 minutes in face-to-face genetic counseling.  The patient brought her mother. Drs. Meliton Rattan, and/or Luckey were available for questions, if needed..    _______________________________________________________________________ For Office Staff:  Number of people involved in session: 2 Was an Intern/ student involved with case: no

## 2023-04-11 ENCOUNTER — Other Ambulatory Visit: Payer: Self-pay

## 2023-04-11 ENCOUNTER — Ambulatory Visit
Admission: RE | Admit: 2023-04-11 | Discharge: 2023-04-11 | Disposition: A | Discharge status: Home / Self Care | Payer: 59 | Source: Ambulatory Visit | Attending: Radiation Oncology | Admitting: Radiation Oncology

## 2023-04-11 DIAGNOSIS — D320 Benign neoplasm of cerebral meninges: Secondary | ICD-10-CM | POA: Diagnosis not present

## 2023-04-11 DIAGNOSIS — Z510 Encounter for antineoplastic radiation therapy: Secondary | ICD-10-CM | POA: Diagnosis not present

## 2023-04-11 DIAGNOSIS — D329 Benign neoplasm of meninges, unspecified: Secondary | ICD-10-CM | POA: Diagnosis not present

## 2023-04-11 DIAGNOSIS — D496 Neoplasm of unspecified behavior of brain: Secondary | ICD-10-CM | POA: Diagnosis not present

## 2023-04-11 LAB — RAD ONC ARIA SESSION SUMMARY
Course Elapsed Days: 14
Plan Fractions Treated to Date: 11
Plan Prescribed Dose Per Fraction: 1.8 Gy
Plan Total Fractions Prescribed: 31
Plan Total Prescribed Dose: 55.8 Gy
Reference Point Dosage Given to Date: 19.8 Gy
Reference Point Session Dosage Given: 1.8 Gy
Session Number: 11

## 2023-04-11 LAB — GENETIC SCREENING ORDER

## 2023-04-12 ENCOUNTER — Other Ambulatory Visit: Payer: Self-pay

## 2023-04-12 ENCOUNTER — Ambulatory Visit
Admission: RE | Admit: 2023-04-12 | Discharge: 2023-04-12 | Disposition: A | Discharge status: Home / Self Care | Payer: 59 | Source: Ambulatory Visit | Attending: Radiation Oncology | Admitting: Radiation Oncology

## 2023-04-12 DIAGNOSIS — D496 Neoplasm of unspecified behavior of brain: Secondary | ICD-10-CM | POA: Diagnosis not present

## 2023-04-12 DIAGNOSIS — D320 Benign neoplasm of cerebral meninges: Secondary | ICD-10-CM | POA: Diagnosis not present

## 2023-04-12 DIAGNOSIS — D329 Benign neoplasm of meninges, unspecified: Secondary | ICD-10-CM | POA: Diagnosis not present

## 2023-04-12 DIAGNOSIS — Z510 Encounter for antineoplastic radiation therapy: Secondary | ICD-10-CM | POA: Diagnosis not present

## 2023-04-12 LAB — RAD ONC ARIA SESSION SUMMARY
Course Elapsed Days: 15
Plan Fractions Treated to Date: 12
Plan Prescribed Dose Per Fraction: 1.8 Gy
Plan Total Fractions Prescribed: 31
Plan Total Prescribed Dose: 55.8 Gy
Reference Point Dosage Given to Date: 21.6 Gy
Reference Point Session Dosage Given: 1.8 Gy
Session Number: 12

## 2023-04-13 ENCOUNTER — Other Ambulatory Visit: Payer: Self-pay

## 2023-04-13 ENCOUNTER — Ambulatory Visit
Admission: RE | Admit: 2023-04-13 | Discharge: 2023-04-13 | Disposition: A | Discharge status: Home / Self Care | Payer: 59 | Source: Ambulatory Visit | Attending: Radiation Oncology | Admitting: Radiation Oncology

## 2023-04-13 DIAGNOSIS — D320 Benign neoplasm of cerebral meninges: Secondary | ICD-10-CM | POA: Diagnosis not present

## 2023-04-13 DIAGNOSIS — D329 Benign neoplasm of meninges, unspecified: Secondary | ICD-10-CM | POA: Diagnosis not present

## 2023-04-13 DIAGNOSIS — D496 Neoplasm of unspecified behavior of brain: Secondary | ICD-10-CM | POA: Diagnosis not present

## 2023-04-13 DIAGNOSIS — Z510 Encounter for antineoplastic radiation therapy: Secondary | ICD-10-CM | POA: Diagnosis not present

## 2023-04-13 LAB — RAD ONC ARIA SESSION SUMMARY
Course Elapsed Days: 16
Plan Fractions Treated to Date: 13
Plan Prescribed Dose Per Fraction: 1.8 Gy
Plan Total Fractions Prescribed: 31
Plan Total Prescribed Dose: 55.8 Gy
Reference Point Dosage Given to Date: 23.4 Gy
Reference Point Session Dosage Given: 1.8 Gy
Session Number: 13

## 2023-04-16 ENCOUNTER — Ambulatory Visit
Admission: RE | Admit: 2023-04-16 | Discharge: 2023-04-16 | Disposition: A | Discharge status: Home / Self Care | Payer: 59 | Source: Ambulatory Visit | Attending: Radiation Oncology | Admitting: Radiation Oncology

## 2023-04-16 ENCOUNTER — Other Ambulatory Visit: Payer: Self-pay

## 2023-04-16 DIAGNOSIS — D329 Benign neoplasm of meninges, unspecified: Secondary | ICD-10-CM | POA: Diagnosis not present

## 2023-04-16 DIAGNOSIS — D320 Benign neoplasm of cerebral meninges: Secondary | ICD-10-CM | POA: Diagnosis not present

## 2023-04-16 DIAGNOSIS — Z510 Encounter for antineoplastic radiation therapy: Secondary | ICD-10-CM | POA: Diagnosis not present

## 2023-04-16 DIAGNOSIS — D496 Neoplasm of unspecified behavior of brain: Secondary | ICD-10-CM | POA: Diagnosis not present

## 2023-04-16 LAB — RAD ONC ARIA SESSION SUMMARY
Course Elapsed Days: 19
Plan Fractions Treated to Date: 14
Plan Prescribed Dose Per Fraction: 1.8 Gy
Plan Total Fractions Prescribed: 31
Plan Total Prescribed Dose: 55.8 Gy
Reference Point Dosage Given to Date: 25.2 Gy
Reference Point Session Dosage Given: 1.8 Gy
Session Number: 14

## 2023-04-17 ENCOUNTER — Other Ambulatory Visit: Payer: Self-pay

## 2023-04-17 ENCOUNTER — Ambulatory Visit
Admission: RE | Admit: 2023-04-17 | Discharge: 2023-04-17 | Disposition: A | Discharge status: Home / Self Care | Payer: 59 | Source: Ambulatory Visit | Attending: Radiation Oncology | Admitting: Radiation Oncology

## 2023-04-17 DIAGNOSIS — D329 Benign neoplasm of meninges, unspecified: Secondary | ICD-10-CM | POA: Diagnosis not present

## 2023-04-17 DIAGNOSIS — Z510 Encounter for antineoplastic radiation therapy: Secondary | ICD-10-CM | POA: Diagnosis not present

## 2023-04-17 DIAGNOSIS — D320 Benign neoplasm of cerebral meninges: Secondary | ICD-10-CM | POA: Diagnosis not present

## 2023-04-17 DIAGNOSIS — D496 Neoplasm of unspecified behavior of brain: Secondary | ICD-10-CM | POA: Diagnosis not present

## 2023-04-17 LAB — RAD ONC ARIA SESSION SUMMARY
Course Elapsed Days: 20
Plan Fractions Treated to Date: 15
Plan Prescribed Dose Per Fraction: 1.8 Gy
Plan Total Fractions Prescribed: 31
Plan Total Prescribed Dose: 55.8 Gy
Reference Point Dosage Given to Date: 27 Gy
Reference Point Session Dosage Given: 1.8 Gy
Session Number: 15

## 2023-04-18 ENCOUNTER — Ambulatory Visit
Admission: RE | Admit: 2023-04-18 | Discharge: 2023-04-18 | Disposition: A | Discharge status: Home / Self Care | Payer: 59 | Source: Ambulatory Visit | Attending: Radiation Oncology | Admitting: Radiation Oncology

## 2023-04-18 ENCOUNTER — Other Ambulatory Visit: Payer: Self-pay

## 2023-04-18 DIAGNOSIS — D329 Benign neoplasm of meninges, unspecified: Secondary | ICD-10-CM | POA: Diagnosis not present

## 2023-04-18 DIAGNOSIS — D320 Benign neoplasm of cerebral meninges: Secondary | ICD-10-CM | POA: Diagnosis not present

## 2023-04-18 DIAGNOSIS — Z510 Encounter for antineoplastic radiation therapy: Secondary | ICD-10-CM | POA: Diagnosis not present

## 2023-04-18 DIAGNOSIS — D496 Neoplasm of unspecified behavior of brain: Secondary | ICD-10-CM | POA: Diagnosis not present

## 2023-04-18 LAB — RAD ONC ARIA SESSION SUMMARY
Course Elapsed Days: 21
Plan Fractions Treated to Date: 16
Plan Prescribed Dose Per Fraction: 1.8 Gy
Plan Total Fractions Prescribed: 31
Plan Total Prescribed Dose: 55.8 Gy
Reference Point Dosage Given to Date: 28.8 Gy
Reference Point Session Dosage Given: 1.8 Gy
Session Number: 16

## 2023-04-19 ENCOUNTER — Ambulatory Visit
Admission: RE | Admit: 2023-04-19 | Discharge: 2023-04-19 | Disposition: A | Discharge status: Home / Self Care | Payer: 59 | Source: Ambulatory Visit | Attending: Radiation Oncology | Admitting: Radiation Oncology

## 2023-04-19 ENCOUNTER — Other Ambulatory Visit: Payer: Self-pay

## 2023-04-19 DIAGNOSIS — D320 Benign neoplasm of cerebral meninges: Secondary | ICD-10-CM | POA: Diagnosis not present

## 2023-04-19 DIAGNOSIS — D496 Neoplasm of unspecified behavior of brain: Secondary | ICD-10-CM | POA: Diagnosis not present

## 2023-04-19 DIAGNOSIS — D329 Benign neoplasm of meninges, unspecified: Secondary | ICD-10-CM | POA: Diagnosis not present

## 2023-04-19 DIAGNOSIS — Z510 Encounter for antineoplastic radiation therapy: Secondary | ICD-10-CM | POA: Diagnosis not present

## 2023-04-19 LAB — RAD ONC ARIA SESSION SUMMARY
Course Elapsed Days: 22
Plan Fractions Treated to Date: 17
Plan Prescribed Dose Per Fraction: 1.8 Gy
Plan Total Fractions Prescribed: 31
Plan Total Prescribed Dose: 55.8 Gy
Reference Point Dosage Given to Date: 30.6 Gy
Reference Point Session Dosage Given: 1.8 Gy
Session Number: 17

## 2023-04-20 ENCOUNTER — Ambulatory Visit
Admission: RE | Admit: 2023-04-20 | Discharge: 2023-04-20 | Disposition: A | Discharge status: Home / Self Care | Payer: 59 | Source: Ambulatory Visit | Attending: Radiation Oncology | Admitting: Radiation Oncology

## 2023-04-20 ENCOUNTER — Other Ambulatory Visit: Payer: Self-pay

## 2023-04-20 DIAGNOSIS — D496 Neoplasm of unspecified behavior of brain: Secondary | ICD-10-CM | POA: Diagnosis not present

## 2023-04-20 DIAGNOSIS — D329 Benign neoplasm of meninges, unspecified: Secondary | ICD-10-CM | POA: Diagnosis not present

## 2023-04-20 DIAGNOSIS — D320 Benign neoplasm of cerebral meninges: Secondary | ICD-10-CM | POA: Diagnosis not present

## 2023-04-20 DIAGNOSIS — Z510 Encounter for antineoplastic radiation therapy: Secondary | ICD-10-CM | POA: Diagnosis not present

## 2023-04-20 LAB — RAD ONC ARIA SESSION SUMMARY
Course Elapsed Days: 23
Plan Fractions Treated to Date: 18
Plan Prescribed Dose Per Fraction: 1.8 Gy
Plan Total Fractions Prescribed: 31
Plan Total Prescribed Dose: 55.8 Gy
Reference Point Dosage Given to Date: 32.4 Gy
Reference Point Session Dosage Given: 1.8 Gy
Session Number: 18

## 2023-04-23 ENCOUNTER — Ambulatory Visit
Admission: RE | Admit: 2023-04-23 | Discharge: 2023-04-23 | Disposition: A | Discharge status: Home / Self Care | Payer: 59 | Source: Ambulatory Visit | Attending: Radiation Oncology | Admitting: Radiation Oncology

## 2023-04-23 ENCOUNTER — Ambulatory Visit: Admission: RE | Admit: 2023-04-23 | Payer: 59 | Source: Ambulatory Visit

## 2023-04-23 ENCOUNTER — Other Ambulatory Visit: Payer: Self-pay

## 2023-04-23 DIAGNOSIS — D320 Benign neoplasm of cerebral meninges: Secondary | ICD-10-CM | POA: Diagnosis not present

## 2023-04-23 DIAGNOSIS — Z510 Encounter for antineoplastic radiation therapy: Secondary | ICD-10-CM | POA: Diagnosis not present

## 2023-04-23 DIAGNOSIS — D329 Benign neoplasm of meninges, unspecified: Secondary | ICD-10-CM | POA: Diagnosis not present

## 2023-04-23 DIAGNOSIS — D496 Neoplasm of unspecified behavior of brain: Secondary | ICD-10-CM | POA: Diagnosis not present

## 2023-04-23 LAB — RAD ONC ARIA SESSION SUMMARY
Course Elapsed Days: 26
Plan Fractions Treated to Date: 19
Plan Prescribed Dose Per Fraction: 1.8 Gy
Plan Total Fractions Prescribed: 31
Plan Total Prescribed Dose: 55.8 Gy
Reference Point Dosage Given to Date: 34.2 Gy
Reference Point Session Dosage Given: 1.8 Gy
Session Number: 19

## 2023-04-24 ENCOUNTER — Ambulatory Visit
Admission: RE | Admit: 2023-04-24 | Discharge: 2023-04-24 | Disposition: A | Discharge status: Home / Self Care | Payer: 59 | Source: Ambulatory Visit | Attending: Radiation Oncology | Admitting: Radiation Oncology

## 2023-04-24 ENCOUNTER — Other Ambulatory Visit: Payer: Self-pay

## 2023-04-24 ENCOUNTER — Ambulatory Visit: Payer: 59

## 2023-04-24 DIAGNOSIS — D320 Benign neoplasm of cerebral meninges: Secondary | ICD-10-CM | POA: Diagnosis not present

## 2023-04-24 DIAGNOSIS — Z510 Encounter for antineoplastic radiation therapy: Secondary | ICD-10-CM | POA: Diagnosis not present

## 2023-04-24 DIAGNOSIS — D329 Benign neoplasm of meninges, unspecified: Secondary | ICD-10-CM | POA: Diagnosis not present

## 2023-04-24 DIAGNOSIS — D496 Neoplasm of unspecified behavior of brain: Secondary | ICD-10-CM | POA: Diagnosis not present

## 2023-04-24 LAB — RAD ONC ARIA SESSION SUMMARY
Course Elapsed Days: 27
Plan Fractions Treated to Date: 20
Plan Prescribed Dose Per Fraction: 1.8 Gy
Plan Total Fractions Prescribed: 31
Plan Total Prescribed Dose: 55.8 Gy
Reference Point Dosage Given to Date: 36 Gy
Reference Point Session Dosage Given: 1.8 Gy
Session Number: 20

## 2023-04-25 ENCOUNTER — Other Ambulatory Visit: Payer: Self-pay

## 2023-04-25 ENCOUNTER — Ambulatory Visit: Payer: 59

## 2023-04-25 ENCOUNTER — Ambulatory Visit
Admission: RE | Admit: 2023-04-25 | Discharge: 2023-04-25 | Disposition: A | Discharge status: Home / Self Care | Payer: 59 | Source: Ambulatory Visit | Attending: Radiation Oncology | Admitting: Radiation Oncology

## 2023-04-25 DIAGNOSIS — D320 Benign neoplasm of cerebral meninges: Secondary | ICD-10-CM | POA: Diagnosis not present

## 2023-04-25 DIAGNOSIS — D496 Neoplasm of unspecified behavior of brain: Secondary | ICD-10-CM | POA: Insufficient documentation

## 2023-04-25 DIAGNOSIS — D329 Benign neoplasm of meninges, unspecified: Secondary | ICD-10-CM | POA: Insufficient documentation

## 2023-04-25 DIAGNOSIS — Z510 Encounter for antineoplastic radiation therapy: Secondary | ICD-10-CM | POA: Diagnosis not present

## 2023-04-25 LAB — RAD ONC ARIA SESSION SUMMARY
Course Elapsed Days: 28
Plan Fractions Treated to Date: 21
Plan Prescribed Dose Per Fraction: 1.8 Gy
Plan Total Fractions Prescribed: 31
Plan Total Prescribed Dose: 55.8 Gy
Reference Point Dosage Given to Date: 37.8 Gy
Reference Point Session Dosage Given: 1.8 Gy
Session Number: 21

## 2023-04-26 ENCOUNTER — Ambulatory Visit
Admission: RE | Admit: 2023-04-26 | Discharge: 2023-04-26 | Disposition: A | Discharge status: Home / Self Care | Payer: 59 | Source: Ambulatory Visit | Attending: Radiation Oncology | Admitting: Radiation Oncology

## 2023-04-26 ENCOUNTER — Other Ambulatory Visit: Payer: Self-pay

## 2023-04-26 DIAGNOSIS — D320 Benign neoplasm of cerebral meninges: Secondary | ICD-10-CM | POA: Diagnosis not present

## 2023-04-26 DIAGNOSIS — D329 Benign neoplasm of meninges, unspecified: Secondary | ICD-10-CM | POA: Diagnosis not present

## 2023-04-26 DIAGNOSIS — Z510 Encounter for antineoplastic radiation therapy: Secondary | ICD-10-CM | POA: Diagnosis not present

## 2023-04-26 DIAGNOSIS — D496 Neoplasm of unspecified behavior of brain: Secondary | ICD-10-CM | POA: Diagnosis not present

## 2023-04-26 LAB — RAD ONC ARIA SESSION SUMMARY
Course Elapsed Days: 29
Plan Fractions Treated to Date: 22
Plan Prescribed Dose Per Fraction: 1.8 Gy
Plan Total Fractions Prescribed: 31
Plan Total Prescribed Dose: 55.8 Gy
Reference Point Dosage Given to Date: 39.6 Gy
Reference Point Session Dosage Given: 1.8 Gy
Session Number: 22

## 2023-04-27 ENCOUNTER — Other Ambulatory Visit: Payer: Self-pay

## 2023-04-27 ENCOUNTER — Ambulatory Visit
Admission: RE | Admit: 2023-04-27 | Discharge: 2023-04-27 | Disposition: A | Discharge status: Home / Self Care | Payer: 59 | Source: Ambulatory Visit | Attending: Radiation Oncology | Admitting: Radiation Oncology

## 2023-04-27 DIAGNOSIS — D320 Benign neoplasm of cerebral meninges: Secondary | ICD-10-CM | POA: Diagnosis not present

## 2023-04-27 DIAGNOSIS — D496 Neoplasm of unspecified behavior of brain: Secondary | ICD-10-CM | POA: Diagnosis not present

## 2023-04-27 DIAGNOSIS — D329 Benign neoplasm of meninges, unspecified: Secondary | ICD-10-CM | POA: Diagnosis not present

## 2023-04-27 DIAGNOSIS — Z510 Encounter for antineoplastic radiation therapy: Secondary | ICD-10-CM | POA: Diagnosis not present

## 2023-04-27 LAB — RAD ONC ARIA SESSION SUMMARY
Course Elapsed Days: 30
Plan Fractions Treated to Date: 23
Plan Prescribed Dose Per Fraction: 1.8 Gy
Plan Total Fractions Prescribed: 31
Plan Total Prescribed Dose: 55.8 Gy
Reference Point Dosage Given to Date: 41.4 Gy
Reference Point Session Dosage Given: 1.8 Gy
Session Number: 23

## 2023-04-30 ENCOUNTER — Ambulatory Visit
Admission: RE | Admit: 2023-04-30 | Discharge: 2023-04-30 | Disposition: A | Discharge status: Home / Self Care | Payer: 59 | Source: Ambulatory Visit | Attending: Radiation Oncology | Admitting: Radiation Oncology

## 2023-04-30 ENCOUNTER — Ambulatory Visit: Payer: 59

## 2023-04-30 ENCOUNTER — Encounter: Payer: Self-pay | Admitting: Genetic Counselor

## 2023-04-30 ENCOUNTER — Other Ambulatory Visit: Payer: Self-pay

## 2023-04-30 ENCOUNTER — Encounter: Payer: 59 | Attending: Registered Nurse | Admitting: Physical Medicine and Rehabilitation

## 2023-04-30 DIAGNOSIS — M48062 Spinal stenosis, lumbar region with neurogenic claudication: Secondary | ICD-10-CM | POA: Insufficient documentation

## 2023-04-30 DIAGNOSIS — D320 Benign neoplasm of cerebral meninges: Secondary | ICD-10-CM | POA: Diagnosis not present

## 2023-04-30 DIAGNOSIS — M5440 Lumbago with sciatica, unspecified side: Secondary | ICD-10-CM | POA: Insufficient documentation

## 2023-04-30 DIAGNOSIS — G4700 Insomnia, unspecified: Secondary | ICD-10-CM | POA: Insufficient documentation

## 2023-04-30 DIAGNOSIS — G8929 Other chronic pain: Secondary | ICD-10-CM | POA: Insufficient documentation

## 2023-04-30 DIAGNOSIS — D496 Neoplasm of unspecified behavior of brain: Secondary | ICD-10-CM | POA: Diagnosis not present

## 2023-04-30 DIAGNOSIS — Z510 Encounter for antineoplastic radiation therapy: Secondary | ICD-10-CM | POA: Diagnosis not present

## 2023-04-30 DIAGNOSIS — D329 Benign neoplasm of meninges, unspecified: Secondary | ICD-10-CM | POA: Diagnosis not present

## 2023-04-30 DIAGNOSIS — Z1379 Encounter for other screening for genetic and chromosomal anomalies: Secondary | ICD-10-CM | POA: Insufficient documentation

## 2023-04-30 LAB — RAD ONC ARIA SESSION SUMMARY
Course Elapsed Days: 33
Plan Fractions Treated to Date: 24
Plan Prescribed Dose Per Fraction: 1.8 Gy
Plan Total Fractions Prescribed: 31
Plan Total Prescribed Dose: 55.8 Gy
Reference Point Dosage Given to Date: 43.2 Gy
Reference Point Session Dosage Given: 1.8 Gy
Session Number: 24

## 2023-04-30 NOTE — Progress Notes (Deleted)
Evelyn [name] is a 52 y.o. year old female  who  has a past medical history of Anemia, Anxiety, Depression, Family history of breast cancer, Family history of colon cancer, GERD (gastroesophageal reflux disease), Headache, Obesity, Seizures (HCC), and Sleep apnea.   They are presenting to PM&R clinic for follow up related to IPR TOC for  L meningioma s/p resection .  From Discharge summary:  Brief HPI:   Evelyn [name] is a 52 y.o. female new onset seizures with left- sided visual loss. Started on Keppra. 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 She underwent left craniotomy and tumor resection by Dr Maurice Small on 02/08/2023. Moderate headache. Vision improved. Keppra continued. 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 Expected left periorbital edema. Patient  requiring min guard assist with cues for transfers and safety and min assist for ambulation. Final path is grade 2 meningioma. Headaches improve with Toradol.      Hospital Course: Evelyn [name] was admitted to rehab 02/15/2023 for inpatient therapies to consist of PT, ST and OT at least three hours five days a week. Past admission physiatrist, therapy team and rehab RN have worked together to provide customized collaborative inpatient rehab. Attention turned to constipation with no BM since admission. Toradol for h/a continued and added gabapentin 300 mg every 8 hours as needed. Follow-up labs with H and H of 8.7 and 28.1. BMET stable. Gabapentin discontinued 2/24 due to history of lethargy on this medication. Episode of hypotension on 2/24 with presyncope.  Her blood pressure responded to IV fluids and follow-up labs obtained.  Serum cortisol within normal limits. Hemoglobin stable. Still cannot comfortably perform superior gaze with L eye, but otherwise EOM improving on 2/27. Woke up with intense bifrontal headache and difficulty with worsening pain and headache with EOM on 2/28. One time dose of gabapentin 300 mg given.  Following morning on 2/29, she again complained of sever headache with new neuro deficits including LLE weakness land LUE spasms. Complained of severe pain and tension across L V1 distribution where she was prior numb. Stat head CT performed and Dr. Maurice Small evaluated the patient.  Reassurance that no acute event or changed seen on imaging studies. See his note on 2/29. Neuropsych eval on 3/01.   Blood pressures were monitored on TID basis and had episode of symptomatic orthostatic hypotension on 2/24 resolved with IV fluids.   Rehab course: During patient's stay in rehab weekly team conferences were held to monitor patient's progress, set goals and discuss barriers to discharge. At admission, patient required CGA/min assist with mobility, min assist.   Interval Hx:  - Therapies: ***  - Follow ups: ***  - Falls:***  - DME:***  - Medications: ***  - Other concerns: ***    PE: Constitution: Appropriate appearance for age. No apparent distress *** +Obese Resp: No respiratory distress. No accessory muscle usage. {Blank multiple:19196::"***","on RA","CTAB","on *** L Polk"} Cardio: Well perfused appearance. *** No peripheral edema. Abdomen: Nondistended. Nontender.   Psych: Appropriate mood and affect. Neuro: AAOx4. No apparent cognitive deficits   Neurologic Exam:   DTRs: Reflexes were 2+ in bilateral achilles, patella, biceps, BR and triceps. Babinsky: flexor responses b/l.   Hoffmans: negative b/l Sensory exam: revealed normal sensation in all dermatomal regions in {Blank multiple:19196::"***","bilateral upper extremities","bilateral lower extremities","right upper extremity","left upper extremity","right lower extremity","left lower extremity","with reduced sensation to light touch in ***"} Motor exam: strength 5/5 throughout {Blank multiple:19196::"***","bilateral upper extremities","bilateral lower extremities","right upper extremity","left upper extremity","right lower extremity","left  lower extremity","with exception of ***"} Coordination: Fine motor coordination was normal.   Gait: {Blank single:19197::"not observed due to safety concerns","normal","+trendelenberg","+antalgic gait","+foot drop","***"}     Evelyn [name] is a 52 y.o. year old female  who  has a past medical history of Anemia, Anxiety, Depression, Family history of breast cancer, Family history of colon cancer, GERD (gastroesophageal reflux disease), Headache, Obesity, Seizures (HCC), and Sleep apnea.   They are presenting to PM&R clinic for follow up related to IPR 2/22-02/26/23 TOC for  L meningioma s/p resection .  There are no diagnoses linked to this encounter.

## 2023-05-01 ENCOUNTER — Ambulatory Visit
Admission: RE | Admit: 2023-05-01 | Discharge: 2023-05-01 | Disposition: A | Discharge status: Home / Self Care | Payer: 59 | Source: Ambulatory Visit | Attending: Radiation Oncology | Admitting: Radiation Oncology

## 2023-05-01 ENCOUNTER — Other Ambulatory Visit: Payer: Self-pay

## 2023-05-01 DIAGNOSIS — D320 Benign neoplasm of cerebral meninges: Secondary | ICD-10-CM | POA: Diagnosis not present

## 2023-05-01 DIAGNOSIS — D496 Neoplasm of unspecified behavior of brain: Secondary | ICD-10-CM | POA: Diagnosis not present

## 2023-05-01 DIAGNOSIS — D329 Benign neoplasm of meninges, unspecified: Secondary | ICD-10-CM | POA: Diagnosis not present

## 2023-05-01 DIAGNOSIS — Z510 Encounter for antineoplastic radiation therapy: Secondary | ICD-10-CM | POA: Diagnosis not present

## 2023-05-01 LAB — RAD ONC ARIA SESSION SUMMARY
Course Elapsed Days: 34
Plan Fractions Treated to Date: 25
Plan Prescribed Dose Per Fraction: 1.8 Gy
Plan Total Fractions Prescribed: 31
Plan Total Prescribed Dose: 55.8 Gy
Reference Point Dosage Given to Date: 45 Gy
Reference Point Session Dosage Given: 1.8 Gy
Session Number: 25

## 2023-05-02 ENCOUNTER — Other Ambulatory Visit: Payer: Self-pay

## 2023-05-02 ENCOUNTER — Ambulatory Visit
Admission: RE | Admit: 2023-05-02 | Discharge: 2023-05-02 | Disposition: A | Discharge status: Home / Self Care | Payer: 59 | Source: Ambulatory Visit | Attending: Radiation Oncology | Admitting: Radiation Oncology

## 2023-05-02 DIAGNOSIS — D329 Benign neoplasm of meninges, unspecified: Secondary | ICD-10-CM | POA: Diagnosis not present

## 2023-05-02 DIAGNOSIS — Z510 Encounter for antineoplastic radiation therapy: Secondary | ICD-10-CM | POA: Diagnosis not present

## 2023-05-02 DIAGNOSIS — D496 Neoplasm of unspecified behavior of brain: Secondary | ICD-10-CM | POA: Diagnosis not present

## 2023-05-02 DIAGNOSIS — D320 Benign neoplasm of cerebral meninges: Secondary | ICD-10-CM | POA: Diagnosis not present

## 2023-05-02 LAB — RAD ONC ARIA SESSION SUMMARY
Course Elapsed Days: 35
Plan Fractions Treated to Date: 26
Plan Prescribed Dose Per Fraction: 1.8 Gy
Plan Total Fractions Prescribed: 31
Plan Total Prescribed Dose: 55.8 Gy
Reference Point Dosage Given to Date: 46.8 Gy
Reference Point Session Dosage Given: 1.8 Gy
Session Number: 26

## 2023-05-03 ENCOUNTER — Telehealth: Payer: Self-pay | Admitting: Genetic Counselor

## 2023-05-03 ENCOUNTER — Other Ambulatory Visit: Payer: Self-pay

## 2023-05-03 ENCOUNTER — Ambulatory Visit: Payer: Self-pay | Admitting: Genetic Counselor

## 2023-05-03 ENCOUNTER — Ambulatory Visit
Admission: RE | Admit: 2023-05-03 | Discharge: 2023-05-03 | Disposition: A | Discharge status: Home / Self Care | Payer: 59 | Source: Ambulatory Visit | Attending: Radiation Oncology | Admitting: Radiation Oncology

## 2023-05-03 DIAGNOSIS — D329 Benign neoplasm of meninges, unspecified: Secondary | ICD-10-CM | POA: Diagnosis not present

## 2023-05-03 DIAGNOSIS — D320 Benign neoplasm of cerebral meninges: Secondary | ICD-10-CM | POA: Diagnosis not present

## 2023-05-03 DIAGNOSIS — Z1379 Encounter for other screening for genetic and chromosomal anomalies: Secondary | ICD-10-CM

## 2023-05-03 DIAGNOSIS — Z510 Encounter for antineoplastic radiation therapy: Secondary | ICD-10-CM | POA: Diagnosis not present

## 2023-05-03 DIAGNOSIS — D496 Neoplasm of unspecified behavior of brain: Secondary | ICD-10-CM | POA: Diagnosis not present

## 2023-05-03 LAB — RAD ONC ARIA SESSION SUMMARY
Course Elapsed Days: 36
Plan Fractions Treated to Date: 27
Plan Prescribed Dose Per Fraction: 1.8 Gy
Plan Total Fractions Prescribed: 31
Plan Total Prescribed Dose: 55.8 Gy
Reference Point Dosage Given to Date: 48.6 Gy
Reference Point Session Dosage Given: 1.8 Gy
Session Number: 27

## 2023-05-03 NOTE — Progress Notes (Addendum)
HPI:  Evelyn [name] was previously seen in the [facility] due to a personal and family history of cancer and concerns regarding a hereditary predisposition to cancer. Please refer to our prior cancer genetics clinic note for more information regarding our discussion, assessment and recommendations, at the time. Evelyn [name] recent genetic test results were disclosed to her, as were recommendations warranted by these results. These results and recommendations are discussed in more detail below.  CANCER HISTORY:  Oncology History   No history exists.    FAMILY HISTORY:  We obtained a detailed, 4-generation family history.  Significant diagnoses are listed below: Family History  Problem Relation Age of Onset   Breast cancer Mother 3   Prostate cancer Father 48   Colon cancer Maternal Grandmother        d. 46   Prostate cancer Maternal Grandfather    Breast cancer Paternal Grandmother    Colon cancer Maternal Uncle 70   Cancer Maternal Uncle 60       NOS   Colon cancer Other        MGMs brother   Colon cancer Other        MGMs 2 sisters   Prostate cancer Other        PGFs 2 brothers   Brain cancer Paternal Uncle 15       Meningioma       The patient has three sons who are cancer free.  She has two brothers who are cancer free. Both parents are living.   The patient's mother was diagnosed with breast cancer at 48.  She underwent genetic testing in 2019 and it was negative.  She has three brothers and a sister.  One brother had colon cancer and another brother had an unknown cancer.  The maternal grandmother had colon cancer and died at 31.  The grandfather had prostate cancer.  The grandmother had two sisters and a brother with colon cancer and another sister with an unknown cancer.   The patients father had prostate cancer at 101.  He had a brother with meningioma and died at 35.  He has several other siblings who are cancer fee. His mother is 95 and had breast  cancer.  His father did not have cancer, but he had two brothers with prostate cancer and a sister with breast cancer.  His mother also had cancer.   Evelyn [name] is aware of previous family history of genetic testing for hereditary cancer risks. Patient's maternal ancestors are of African American descent, and paternal ancestors are of African American descent. There is no reported Ashkenazi Jewish ancestry. There is no known consanguinity. and concerns regarding a hereditary predisposition to cancer. Please refer to our prior cancer genetics clinic note for more information regarding our discussion, assessment and recommendations, at the time. Evelyn [name] recent genetic test results were disclosed to her, as were recommendations warranted by these results. These results and recommendations are discussed in more detail below.  GENETIC TEST RESULTS: Genetic testing reported out on Apr 30, 2023 through the CancerNext-Expanded+RNAinsight cancer panel found no pathogenic mutations. The CancerNext-Expanded gene panel offered by [employer] and includes sequencing and rearrangement analysis for the following 71 genes: AIP, ALK, APC, ATM, BAP1, BARD1, BMPR1A, BRCA1, BRCA2, BRIP1, CDC73, CDH1, CDK4, CDKN1B, CDKN2A, CHEK2, DICER1, FH, FLCN, KIF1B, LZTR1, MAX, MEN1, MET, MLH1, MSH2, MSH6, MUTYH, NF1, NF2, NTHL1, PALB2, PHOX2B, PMS2, POT1, PRKAR1A, PTCH1, PTEN, RAD51C, RAD51D, RB1, RET, SDHA, SDHAF2, SDHB, SDHC, SDHD, SMAD4,  SMARCA4, SMARCB1, SMARCE1, STK11, SUFU, TMEM127, TP53, TSC1, TSC2 and VHL (sequencing and deletion/duplication); AXIN2, CTNNA1, EGFR, EGLN1, HOXB13, KIT, MITF, MSH3, PDGFRA, POLD1 and POLE (sequencing only); EPCAM and GREM1 (deletion/duplication only). RNA data is routinely analyzed for use in variant interpretation for all genes. The test report has been scanned into EPIC and is located under the Molecular Pathology section of the Results Review tab.  A portion of the result report is included below  for reference.     We discussed with Evelyn [name] that because current genetic testing is not perfect, it is possible there may be a gene mutation in one of these genes that current testing cannot detect, but that chance is small.  We also discussed, that there could be another gene that has not yet been discovered, or that we have not yet tested, that is responsible for the cancer diagnoses in the family. It is also possible there is a hereditary cause for the cancer in the family that Evelyn [name] did not inherit and therefore was not identified in her testing.  Therefore, it is important to remain in touch with cancer genetics in the future so that we can continue to offer Evelyn [name] the most up to date genetic testing.   ADDITIONAL GENETIC TESTING: We discussed with Evelyn [name] that her genetic testing was fairly extensive.  If there are genes identified to increase cancer risk that can be analyzed in the future, we would be happy to discuss and coordinate this testing at that time.    CANCER SCREENING RECOMMENDATIONS: Evelyn [name] test result is considered negative (normal).  This means that we have not identified a hereditary cause for her personal and family history of cancer at this time. Most cancers happen by chance and this negative test suggests that her cancer may fall into this category.    Possible reasons for Evelyn [name]'s negative genetic test include:  1. There may be a gene mutation in one of these genes that current testing methods cannot detect but that chance is small.  2. There could be another gene that has not yet been discovered, or that we have not yet tested, that is responsible for the cancer diagnoses in the family.  3.  There may be no hereditary risk for cancer in the family. The cancers in Evelyn [name] and/or her family may be sporadic/familial or due to other genetic and environmental factors. 4. It is also possible there is a hereditary cause for the cancer in the family that Evelyn [name].  [name] did not inherit.  Therefore, it is recommended she continue to follow the cancer management and screening guidelines provided by her oncology and primary healthcare provider. An individual's cancer risk and medical management are not determined by genetic test results alone. Overall cancer risk assessment incorporates additional factors, including personal medical history, family history, and any available genetic information that may result in a personalized plan for cancer prevention and surveillance  Evelyn [name] has been determined to be at high risk for breast cancer. her Tyrer-Cuzick risk score is 27%.  For women with a greater than 20% lifetime risk of breast cancer, the [employer] (NCCN) recommends the following:   1.      Clinical encounter every 6-12 months to begin when identified as being at increased risk, but not before age 64  2.      Annual mammograms. Tomosynthesis is recommended starting 10 years earlier than the youngest breast cancer diagnosis in the family or  at age 53 (whichever comes first), but not before age 87    3.      Annual breast MRI starting 10 years earlier than the youngest breast cancer diagnosis in the family or at age 21 (whichever comes first), but not before age 3.   We, therefore, discussed that it is reasonable for Evelyn [name] to be followed by a high-risk breast cancer clinic; in addition to a yearly mammogram and physical exam by a healthcare provider, she should discuss the usefulness of an annual breast MRI with the high-risk clinic providers.  We will refer her to the [facility] Breast [facility].           RECOMMENDATIONS FOR FAMILY MEMBERS:  Individuals in this family might be at some increased risk of developing cancer, [name] the general population risk, simply due to the family history of cancer.  We recommended women in this family have a yearly mammogram beginning at age 17, or 72 years younger than the earliest onset of  cancer, an annual clinical breast exam, and perform monthly breast self-exams. Women in this family should also have a gynecological exam as recommended by their primary provider. All family members should be referred for colonoscopy starting at age 63.  It is also possible there is a hereditary cause for the cancer in Evelyn [name]'s family that she did not inherit and therefore was not identified in her.  Based on Evelyn [name]'s family history, we recommended her father, who was diagnosed with prostate cancer, have genetic counseling and testing. Evelyn [name]. [place] will let us know if we can be of any assistance in coordinating genetic counseling and/or testing for this family member.   FOLLOW-UP: Lastly, we discussed with Evelyn [name]. [place] that cancer genetics is a rapidly advancing field and it is possible that new genetic tests will be appropriate for her and/or her family members in the future. We encouraged her to remain in contact with cancer genetics on an annual basis so we can update her personal and family histories and let her know of advances in cancer genetics that may benefit this family.   Our contact number was provided. Evelyn [name]. Jalloh questions were answered to her satisfaction, and she knows she is welcome to call us at anytime with additional questions or concerns.   Maylon Cos, Evelyn [name], [facility] Licensed, Certified Genetic Counselor [email or web address]

## 2023-05-03 NOTE — Telephone Encounter (Signed)
Revealed negative genetic testing.  Discussed that we do not know why she has a meningioma or why there is cancer in the family. Her mother had negative genetic testing as well, so the colon cancer in the family could be something that her mother did not have.  It could be due to a different gene that we are not testing, or maybe our current technology may not be able to pick something up.  It will be important for her to keep in contact with genetics to keep up with whether additional testing may be needed.  She is at high risk for breast cancer so we will refer her to the high risk breast clinic.

## 2023-05-04 ENCOUNTER — Ambulatory Visit
Admission: RE | Admit: 2023-05-04 | Discharge: 2023-05-04 | Disposition: A | Discharge status: Home / Self Care | Payer: 59 | Source: Ambulatory Visit | Attending: Radiation Oncology | Admitting: Radiation Oncology

## 2023-05-04 ENCOUNTER — Other Ambulatory Visit: Payer: Self-pay

## 2023-05-04 DIAGNOSIS — D320 Benign neoplasm of cerebral meninges: Secondary | ICD-10-CM | POA: Diagnosis not present

## 2023-05-04 DIAGNOSIS — Z510 Encounter for antineoplastic radiation therapy: Secondary | ICD-10-CM | POA: Diagnosis not present

## 2023-05-04 DIAGNOSIS — D329 Benign neoplasm of meninges, unspecified: Secondary | ICD-10-CM | POA: Diagnosis not present

## 2023-05-04 DIAGNOSIS — D496 Neoplasm of unspecified behavior of brain: Secondary | ICD-10-CM | POA: Diagnosis not present

## 2023-05-04 LAB — RAD ONC ARIA SESSION SUMMARY
Course Elapsed Days: 37
Plan Fractions Treated to Date: 28
Plan Prescribed Dose Per Fraction: 1.8 Gy
Plan Total Fractions Prescribed: 31
Plan Total Prescribed Dose: 55.8 Gy
Reference Point Dosage Given to Date: 50.4 Gy
Reference Point Session Dosage Given: 1.8 Gy
Session Number: 28

## 2023-05-07 ENCOUNTER — Other Ambulatory Visit: Payer: Self-pay

## 2023-05-07 ENCOUNTER — Ambulatory Visit
Admission: RE | Admit: 2023-05-07 | Discharge: 2023-05-07 | Disposition: A | Discharge status: Home / Self Care | Payer: 59 | Source: Ambulatory Visit | Attending: Radiation Oncology | Admitting: Radiation Oncology

## 2023-05-07 ENCOUNTER — Ambulatory Visit: Payer: 59

## 2023-05-07 DIAGNOSIS — D320 Benign neoplasm of cerebral meninges: Secondary | ICD-10-CM | POA: Diagnosis not present

## 2023-05-07 DIAGNOSIS — D329 Benign neoplasm of meninges, unspecified: Secondary | ICD-10-CM | POA: Diagnosis not present

## 2023-05-07 DIAGNOSIS — Z510 Encounter for antineoplastic radiation therapy: Secondary | ICD-10-CM | POA: Diagnosis not present

## 2023-05-07 DIAGNOSIS — D496 Neoplasm of unspecified behavior of brain: Secondary | ICD-10-CM | POA: Diagnosis not present

## 2023-05-07 LAB — RAD ONC ARIA SESSION SUMMARY
Course Elapsed Days: 40
Plan Fractions Treated to Date: 29
Plan Prescribed Dose Per Fraction: 1.8 Gy
Plan Total Fractions Prescribed: 31
Plan Total Prescribed Dose: 55.8 Gy
Reference Point Dosage Given to Date: 52.2 Gy
Reference Point Session Dosage Given: 1.8 Gy
Session Number: 29

## 2023-05-08 ENCOUNTER — Other Ambulatory Visit: Payer: Self-pay

## 2023-05-08 ENCOUNTER — Ambulatory Visit
Admission: RE | Admit: 2023-05-08 | Discharge: 2023-05-08 | Disposition: A | Discharge status: Home / Self Care | Payer: 59 | Source: Ambulatory Visit | Attending: Radiation Oncology | Admitting: Radiation Oncology

## 2023-05-08 DIAGNOSIS — D320 Benign neoplasm of cerebral meninges: Secondary | ICD-10-CM | POA: Diagnosis not present

## 2023-05-08 DIAGNOSIS — D329 Benign neoplasm of meninges, unspecified: Secondary | ICD-10-CM | POA: Diagnosis not present

## 2023-05-08 DIAGNOSIS — Z510 Encounter for antineoplastic radiation therapy: Secondary | ICD-10-CM | POA: Diagnosis not present

## 2023-05-08 DIAGNOSIS — D496 Neoplasm of unspecified behavior of brain: Secondary | ICD-10-CM | POA: Diagnosis not present

## 2023-05-08 LAB — RAD ONC ARIA SESSION SUMMARY
Course Elapsed Days: 41
Plan Fractions Treated to Date: 30
Plan Prescribed Dose Per Fraction: 1.8 Gy
Plan Total Fractions Prescribed: 31
Plan Total Prescribed Dose: 55.8 Gy
Reference Point Dosage Given to Date: 54 Gy
Reference Point Session Dosage Given: 1.8 Gy
Session Number: 30

## 2023-05-09 ENCOUNTER — Ambulatory Visit
Admission: RE | Admit: 2023-05-09 | Discharge: 2023-05-09 | Disposition: A | Discharge status: Home / Self Care | Payer: 59 | Source: Ambulatory Visit | Attending: Radiation Oncology | Admitting: Radiation Oncology

## 2023-05-09 ENCOUNTER — Other Ambulatory Visit: Payer: Self-pay

## 2023-05-09 DIAGNOSIS — D329 Benign neoplasm of meninges, unspecified: Secondary | ICD-10-CM | POA: Diagnosis not present

## 2023-05-09 DIAGNOSIS — D496 Neoplasm of unspecified behavior of brain: Secondary | ICD-10-CM | POA: Diagnosis not present

## 2023-05-09 DIAGNOSIS — Z510 Encounter for antineoplastic radiation therapy: Secondary | ICD-10-CM | POA: Diagnosis not present

## 2023-05-09 DIAGNOSIS — D320 Benign neoplasm of cerebral meninges: Secondary | ICD-10-CM | POA: Diagnosis not present

## 2023-05-09 LAB — RAD ONC ARIA SESSION SUMMARY
Course Elapsed Days: 42
Plan Fractions Treated to Date: 31
Plan Prescribed Dose Per Fraction: 1.8 Gy
Plan Total Fractions Prescribed: 31
Plan Total Prescribed Dose: 55.8 Gy
Reference Point Dosage Given to Date: 55.8 Gy
Reference Point Session Dosage Given: 1.8 Gy
Session Number: 31

## 2023-05-14 NOTE — Radiation Completion Notes (Signed)
Patient Name: Evelyn [name], Evelyn [name] MRN: [id number] Date of Birth: [birth year] Referring Physician: Misty Stanley, M.D. Date of Service: 2023-05-14 Radiation Oncologist: Lonie Peak, M.D. 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 Intent: Curative     ==========DELIVERED PLANS==========  First Treatment Date: 2023-03-28 - Last Treatment Date: 2023-05-09   Plan Name: Brain_L_Front Site: Brain Technique: IMRT Mode: Photon Dose Per Fraction: 1.8 Gy Prescribed Dose (Delivered / Prescribed): 55.8 Gy / 55.8 Gy Prescribed Fxs (Delivered / Prescribed): 31 / 31     ==========ON TREATMENT VISIT DATES========== 2023-04-02, 2023-04-09, 2023-04-16, 2023-04-23, 2023-04-24, 2023-04-30, 2023-05-07     ==========UPCOMING VISITS==========       ==========APPENDIX - ON TREATMENT VISIT NOTES==========   See weekly On Treatment Notes is Epic for details.

## 2023-05-16 DIAGNOSIS — Z0271 Encounter for disability determination: Secondary | ICD-10-CM

## 2023-05-25 ENCOUNTER — Inpatient Hospital Stay: Payer: 59 | Admitting: Hematology and Oncology

## 2023-05-25 ENCOUNTER — Inpatient Hospital Stay: Payer: 59

## 2023-05-31 NOTE — Progress Notes (Signed)
Evelyn [name] was called today for follow up after completing radiation treatment for a cerebral Meningioma on 05-09-23.   Recent neurologic symptoms, if any:  Seizures: no Headaches: yes, constant tension, not as sharp. Has been out of Topamax since Sunday, hopefully getting today Nausea: no Dizziness/ataxia: yes, dizzy with quick position changes, still gets off balance Difficulty with hand coordination: yes, though improving Focal numbness/weakness: yes, tingling in left hand and left leg (like a needle prick), numbness in head where surgery was  Visual deficits/changes: yes, though getting better, vision doctor did see improvement though weakness in left eye according to vision doctor Confusion/Memory deficits: yes, still gets dates especially mixed up. Really has to write things down.   Other issues of note: Reports skin is healing well overall. She overall is recovering well. RN encouraged her that her symptoms should improve with more time. She was grateful for encouragement.

## 2023-06-05 ENCOUNTER — Other Ambulatory Visit: Payer: Self-pay

## 2023-06-05 ENCOUNTER — Inpatient Hospital Stay: Payer: Medicaid Other

## 2023-06-05 ENCOUNTER — Inpatient Hospital Stay: Payer: Medicaid Other | Attending: Neurological Surgery | Admitting: Hematology and Oncology

## 2023-06-05 VITALS — BP 133/76 | HR 65 | Temp 97.4°F | Resp 18 | Ht 67.0 in | Wt 256.6 lb

## 2023-06-05 DIAGNOSIS — D429 Neoplasm of uncertain behavior of meninges, unspecified: Secondary | ICD-10-CM

## 2023-06-05 DIAGNOSIS — Z803 Family history of malignant neoplasm of breast: Secondary | ICD-10-CM | POA: Diagnosis not present

## 2023-06-05 MED ORDER — ANASTROZOLE 1 MG PO TABS: 1.0000 mg | ORAL_TABLET | Freq: Every day | ORAL | 3 refills | Status: DC

## 2023-06-05 NOTE — Progress Notes (Signed)
[facility] CONSULT NOTE  Patient Care Team: Rosemary Holms as PCP - General (Physician Assistant)  CHIEF COMPLAINTS/PURPOSE OF CONSULTATION:  At high risk for breast cancer  ASSESSMENT & PLAN:   This is a 52 yr old with PMH significant for anxiety/depression, atypical meningiomas s/p resection referred to high risk breast given her family history of breast cancer and increased lifetime risk.  We discussed her life time risk of breast cancer and 5 yr risk of breast cancer today which is 27% and 2.2%  2. We discussed different risk reducing strategies for future breast cancer risk. We discussed chemoprevention and lifestyle modification. We discussed role of ongoing surveillance with mammograms versus MRIs. Patient would be a good candidate for chemoprevention with  tamoxifen or Evista or aromatase inhibitors. We discussed different medications available. We discussed their particular side effects.  Aromatase inhibitors side effects also include but not limiting to bone loss, aches and pains, hyperlipidemia, hot flashes and mood swings.  Patient felt more comfortable with starting anastrozole.   3. Lifestyle modification: We discussed different interventions exercise at least 5 days a week including both aerobic as well as weight-bearing exercises and strength training. He discussed dietary modification including increasing number of servings of fruit and vegetables as well as decreased in number of servings of meat. We discussed the importance of maintaining a good BMI and limiting alcohol intake.  4. Surveillance: Patient certainly would be a good candidate for ongoing mammograms as well as MRI screening given lifetime risk of breast cancer over 20%.  We have discussed about unknown long-term risks of gadolinium deposition and increased sensitivity with MRIs which may lead to additional biopsies.  5. Chemoprevention: As discussed above  6. Genetics: Genetic testing  negative  7. Followup: Patient will be seen back in 6 months for follow up   HISTORY OF PRESENTING ILLNESS:  Evelyn [name] 52 y.o. female is here because of family history of breast cancer.  This is a very pleasant 52 yr old female with FH of breast cancer in mom in late 94's, paternal grandmother with breast cancer at unknown age, maternal grandmother with colon cancer, paternal great grand mother with breast cancer, paternal cousin in 96's with pancreatic cancer referred to high risk breast clinic. She was recently diagnosed with atypical meningioma had surgical resection followed by radiation.  She says she is still recovering and is currently disabled secondary to this.  Other than this, she had 2 benign breast biopsies in the past.  She had 3 sons and age at first childbirth of 26, no history of breast-feeding.  Age at menarche of 64. Rest of the pertinent 10 point ROS reviewed and negative  REVIEW OF SYSTEMS:   Constitutional: Denies fevers, chills or abnormal night sweats Eyes: Denies blurriness of vision, double vision or watery eyes Ears, nose, mouth, throat, and face: Denies mucositis or sore throat Respiratory: Denies cough, dyspnea or wheezes Cardiovascular: Denies palpitation, chest discomfort or lower extremity swelling Gastrointestinal:  Denies nausea, heartburn or change in bowel habits Skin: Denies abnormal skin rashes Lymphatics: Denies new lymphadenopathy or easy bruising Neurological:Denies numbness, tingling or new weaknesses Behavioral/Psych: Mood is stable, no new changes  All other systems were reviewed with the patient and are negative.  MEDICAL HISTORY:  Past Medical History:  Diagnosis Date   Anemia    Anxiety    Depression    Family history of breast cancer    Family history of colon cancer  GERD (gastroesophageal reflux disease)    Headache    Obesity    Seizures (HCC)    Sleep apnea     SURGICAL HISTORY: Past Surgical History:  Procedure  Laterality Date   ABDOMINAL HYSTERECTOMY     APPLICATION OF CRANIAL NAVIGATION N/A 02/08/2023   Procedure: APPLICATION OF CRANIAL NAVIGATION;  Surgeon: Jadene Pierini, MD;  Location: [facility] OR;  Service: Neurosurgery;  Laterality: N/A;   BREAST LUMPECTOMY Left 1997   CHOLECYSTECTOMY     CRANIOTOMY Left 02/08/2023   Procedure: Left craniotomy for tumor resection with stealth navigation;  Surgeon: Jadene Pierini, MD;  [facility];  Service: Neurosurgery;  Laterality: Left;   GASTRIC BYPASS  03/2017   THROAT SURGERY     removed scar tissue from throat d/t being intubated.    TUBAL LIGATION      SOCIAL HISTORY: Social History   Socioeconomic History   Marital status: Single    Spouse name: Not on file   Number of children: Not on file   Years of education: Not on file   Highest education level: Not on file  Occupational History   Not on file  Tobacco Use   Smoking status: Never   Smokeless tobacco: Never  Vaping Use   Vaping Use: Never used  Substance and Sexual Activity   Alcohol use: No   Drug use: No   Sexual activity: Yes    Birth control/protection: Surgical  Other Topics Concern   Not on file  Social History Narrative   Not on file   Social Determinants of Health   Financial Resource Strain: Not on file  Food Insecurity: No Food Insecurity (03/10/2023)   Hunger Vital Sign    Worried About Running Out of Food in the Last Year: Never true    Ran Out of Food in the Last Year: Never true  Transportation Needs: No Transportation Needs (03/10/2023)   PRAPARE - Administrator, Civil Service (Medical): No    Lack of Transportation (Non-Medical): No  Physical Activity: Not on file  Stress: Not on file  Social Connections: Not on file  Intimate Partner Violence: Not At Risk (03/10/2023)   Humiliation, Afraid, Rape, and Kick questionnaire    Fear of Current or Ex-Partner: No    Emotionally Abused: No    Physically Abused: No    Sexually Abused: No     FAMILY HISTORY: Family History  Problem Relation Age of Onset   Breast cancer Mother 68   Prostate cancer Father 38   Colon cancer Maternal Grandmother        d. 53   Prostate cancer Maternal Grandfather    Breast cancer Paternal Grandmother    Colon cancer Maternal Uncle 67   Cancer Maternal Uncle 60       NOS   Colon cancer Other        MGMs brother   Colon cancer Other        MGMs 2 sisters   Prostate cancer Other        PGFs 2 brothers   Brain cancer Paternal Uncle 15       Meningioma    ALLERGIES:  is allergic to dilaudid [hydromorphone hcl], morphine and codeine, and vicodin [hydrocodone-acetaminophen].  MEDICATIONS:  Current Outpatient Medications  Medication Sig Dispense Refill   acetaminophen (TYLENOL) 500 MG tablet Take 1,000 mg by mouth in the morning and at bedtime.     butalbital-acetaminophen-caffeine (FIORICET) 50-325-40 MG tablet Take 2 tablets  by mouth every 6 (six) hours as needed for headache (first line for headache).   Not to exceed 6 tablets per day 60 tablet 0   calcium carbonate (TUMS - DOSED IN MG ELEMENTAL CALCIUM) 500 MG chewable tablet Chew 1 tablet (200 mg of elemental calcium total) by mouth 3 (three) times daily. 30 tablet 0   dextromethorphan-guaiFENesin (MUCINEX DM) 30-600 MG 12hr tablet Take 1 tablet by mouth 2 (two) times daily. 15 tablet 0   DULoxetine (CYMBALTA) 30 MG capsule Take 2 capsules (60mg ) by mouth daily at 8:00am and 1 capsule (30mg ) nightly at 10:00pm (Patient not taking: Reported on 04/03/2023) 90 capsule 0   famotidine (PEPCID) 20 MG tablet Take 1 tablet (20 mg total) by mouth 2 (two) times daily. 60 tablet 0   levETIRAcetam (KEPPRA) 750 MG tablet Take 1 tablet (750 mg total) by mouth 2 (two) times daily. 60 tablet 0   methocarbamol (ROBAXIN) 500 MG tablet Take 1 tablet (500 mg total) by mouth every 6 (six) hours as needed for muscle spasms. 60 tablet 1   nystatin (MYCOSTATIN/NYSTOP) powder Apply to affected area 3 times daily  (Patient not taking: Reported on 04/03/2023)     nystatin cream (MYCOSTATIN) Apply topically 2 (two) times daily. (Patient not taking: Reported on 04/03/2023)     polyethylene glycol (MIRALAX / GLYCOLAX) 17 g packet Take 17 g by mouth 2 (two) times daily. (Patient not taking: Reported on 03/10/2023) 14 each 0   Rivaroxaban (XARELTO) 15 MG TABS tablet Take 1 tablet (15 mg total) by mouth 2 (two) times daily with a meal for 12 days. (Patient not taking: Reported on 04/03/2023) 24 tablet 0   rivaroxaban (XARELTO) 20 MG TABS tablet Take 1 tablet (20 mg total) by mouth daily with supper. 30 tablet 1   topiramate (TOPAMAX) 50 MG tablet Take 1 tablet (50 mg total) by mouth 2 (two) times daily. 60 tablet 1   Vitamin D, Ergocalciferol, (DRISDOL) 1.25 MG (50000 UNIT) CAPS capsule Take 1 capsule (50,000 Units total) by mouth once a week. 5 capsule 0   No current facility-administered medications for this visit.     PHYSICAL EXAMINATION: ECOG PERFORMANCE STATUS: 0 - Asymptomatic  Vitals:   06/05/23 1010  BP: 133/76  Pulse: 65  Resp: 18  Temp: (!) 97.4 F (36.3 C)  SpO2: 100%   Filed Weights   06/05/23 1010  Weight: 256 lb 9.6 oz (116.4 kg)    GENERAL:alert, no distress and comfortable Neck: No regional adenopathy Breast: Bilateral breast inspected and palpated.  No palpable masses or regional adenopathy. No lower extremity edema  LABORATORY DATA:  I have reviewed the data as listed Lab Results  Component Value Date   WBC 6.7 03/14/2023   HGB 8.2 (L) 03/14/2023   HCT 25.5 (L) 03/14/2023   MCV 80.4 03/14/2023   PLT 331 03/14/2023     Chemistry      Component Value Date/Time   NA 133 (L) 03/14/2023 0122   K 3.8 03/14/2023 0122   CL 102 03/14/2023 0122   CO2 23 03/14/2023 0122   BUN 6 03/14/2023 0122   CREATININE 0.72 03/14/2023 0122      Component Value Date/Time   CALCIUM 8.5 (L) 03/14/2023 0122   ALKPHOS 54 03/13/2023 0043   AST 22 03/13/2023 0043   ALT 16 03/13/2023 0043    BILITOT 0.4 03/13/2023 0043       RADIOGRAPHIC STUDIES: I have personally reviewed the radiological images as listed and agreed  with the findings in the report. No results found.  All questions were answered. The patient knows to call the clinic with any problems, questions or concerns. I spent 45 minutes in the care of this patient including H and P, review of records, counseling and coordination of care.     Rachel Moulds, MD 06/05/2023 10:18 AM

## 2023-06-06 ENCOUNTER — Telehealth: Payer: Self-pay | Admitting: Hematology and Oncology

## 2023-06-06 NOTE — Telephone Encounter (Signed)
Spoke with patient confirming upcoming appointment  

## 2023-06-14 ENCOUNTER — Telehealth: Payer: Self-pay

## 2023-06-14 ENCOUNTER — Ambulatory Visit
Admission: RE | Admit: 2023-06-14 | Discharge: 2023-06-14 | Disposition: A | Discharge status: Home / Self Care | Payer: Medicaid Other | Source: Ambulatory Visit | Attending: Radiation Oncology | Admitting: Radiation Oncology

## 2023-06-14 ENCOUNTER — Encounter: Payer: Self-pay | Admitting: Radiation Oncology

## 2023-06-14 NOTE — Telephone Encounter (Signed)
Pt called for telephone follow up. Note completed and routed to Dr. Basilio Cairo for review.

## 2023-06-21 ENCOUNTER — Telehealth: Payer: Self-pay | Admitting: Neurology

## 2023-06-21 MED ORDER — TOPIRAMATE 50 MG PO TABS: 50.0000 mg | ORAL_TABLET | Freq: Two times a day (BID) | ORAL | 0 refills | Status: DC

## 2023-06-21 NOTE — Telephone Encounter (Signed)
Call to patient, she states she is out of topiramate since Monday. She contact PCP but they would like for [place] to refill. Appointment is scheduled for 07/11/23 with Dr. Teresa Coombs. Advised I would send to covering provider for order. She states she isn't sure if she has has a seizure but does feel differently.

## 2023-06-21 NOTE — Telephone Encounter (Signed)
Pt said was prescribed topiramate (TOPAMAX) 50 MG tablet while in the hospital. PCP advised to for neurologist to fill the prescription. Would like a call back.

## 2023-06-22 ENCOUNTER — Emergency Department (HOSPITAL_COMMUNITY): Payer: Medicaid Other

## 2023-06-22 ENCOUNTER — Emergency Department (HOSPITAL_COMMUNITY)
Admission: EM | Admit: 2023-06-22 | Discharge: 2023-06-22 | Disposition: A | Discharge status: Home / Self Care | Payer: Medicaid Other | Attending: Emergency Medicine | Admitting: Emergency Medicine

## 2023-06-22 ENCOUNTER — Encounter (HOSPITAL_COMMUNITY): Payer: Self-pay

## 2023-06-22 DIAGNOSIS — D72819 Decreased white blood cell count, unspecified: Secondary | ICD-10-CM | POA: Insufficient documentation

## 2023-06-22 DIAGNOSIS — R051 Acute cough: Secondary | ICD-10-CM | POA: Diagnosis not present

## 2023-06-22 DIAGNOSIS — R059 Cough, unspecified: Secondary | ICD-10-CM | POA: Diagnosis present

## 2023-06-22 DIAGNOSIS — Z1152 Encounter for screening for COVID-19: Secondary | ICD-10-CM | POA: Diagnosis not present

## 2023-06-22 LAB — BASIC METABOLIC PANEL
Anion gap: 10 (ref 5–15)
BUN: 5 mg/dL — ABNORMAL LOW (ref 6–20)
CO2: 23 mmol/L (ref 22–32)
Calcium: 8.6 mg/dL — ABNORMAL LOW (ref 8.9–10.3)
Chloride: 103 mmol/L (ref 98–111)
Creatinine, Ser: 0.71 mg/dL (ref 0.44–1.00)
GFR, Estimated: >60 mL/min (ref >60)
Glucose, Bld: 96 mg/dL (ref 70–99)
Potassium: 3.2 mmol/L — ABNORMAL LOW (ref 3.5–5.1)
Sodium: 136 mmol/L (ref 135–145)

## 2023-06-22 LAB — CBC WITH DIFFERENTIAL/PLATELET
Abs Immature Granulocytes: 0.01 10*3/uL (ref 0.00–0.07)
Basophils Absolute: 0 10*3/uL (ref 0.0–0.1)
Basophils Relative: 1 %
Eosinophils Absolute: 0 10*3/uL (ref 0.0–0.5)
Eosinophils Relative: 1 %
HCT: 33.8 % — ABNORMAL LOW (ref 36.0–46.0)
Hemoglobin: 10.7 g/dL — ABNORMAL LOW (ref 12.0–15.0)
Immature Granulocytes: 1 %
Lymphocytes Relative: 24 %
Lymphs Abs: 0.5 10*3/uL — ABNORMAL LOW (ref 0.7–4.0)
MCH: 25.4 pg — ABNORMAL LOW (ref 26.0–34.0)
MCHC: 31.7 g/dL (ref 30.0–36.0)
MCV: 80.1 fL (ref 80.0–100.0)
Monocytes Absolute: 0.1 10*3/uL (ref 0.1–1.0)
Monocytes Relative: 7 %
Neutro Abs: 1.5 10*3/uL — ABNORMAL LOW (ref 1.7–7.7)
Neutrophils Relative %: 66 %
Platelets: 171 10*3/uL (ref 150–400)
RBC: 4.22 MIL/uL (ref 3.87–5.11)
RDW: 16.8 % — ABNORMAL HIGH (ref 11.5–15.5)
WBC: 2.2 10*3/uL — ABNORMAL LOW (ref 4.0–10.5)
nRBC: 0 % (ref 0.0–0.2)

## 2023-06-22 LAB — SARS CORONAVIRUS 2 BY RT PCR: SARS Coronavirus 2 by RT PCR: NEGATIVE

## 2023-06-22 MED ORDER — DOXYCYCLINE HYCLATE 100 MG PO CAPS: 100.0000 mg | ORAL_CAPSULE | Freq: Two times a day (BID) | ORAL | 0 refills | Status: DC

## 2023-06-22 MED ORDER — DOXYCYCLINE HYCLATE 100 MG PO TABS
100.0000 mg | ORAL_TABLET | Freq: Once | ORAL | Status: AC
  Administered 2023-06-22: 100 mg via ORAL
  Filled 2023-06-22: qty 1

## 2023-06-22 MED ORDER — DOXYCYCLINE HYCLATE 100 MG PO CAPS: 100.0000 mg | ORAL_CAPSULE | Freq: Two times a day (BID) | ORAL | 0 refills | Status: AC

## 2023-06-22 MED ORDER — POTASSIUM CHLORIDE CRYS ER 20 MEQ PO TBCR
40.0000 meq | EXTENDED_RELEASE_TABLET | Freq: Once | ORAL | Status: AC
  Administered 2023-06-22: 40 meq via ORAL
  Filled 2023-06-22: qty 2

## 2023-06-22 NOTE — ED Triage Notes (Signed)
PT called by her primary after having blood work drawn yesterday, said her WBC was extremley low.  Primary advised unsure if her new medications were causing the low counts or if she could possibly have some type of cancer.  Pt recently had a brain tumor removed and been treated for the last 31 days.

## 2023-06-22 NOTE — ED Provider Notes (Signed)
[place] EMERGENCY DEPARTMENT AT [facility] Provider Note   HPI: Evelyn [name] is a 52 year old female presenting today from her PCP due to concerns of a low white blood cell count.  She has had associated nasal congestion, rhinorrhea, and a cough.  She reports this was initially a dry cough but is not productive of sputum.  She endorses fevers chills and bodyaches.  She has not had any sick contacts with similar symptoms.  She endorses a history of brain cancer status post resection.  She has been tolerating good p.o. intake and is made to good urinary output.  He has not had nausea, vomiting, diarrhea, abdominal pain.  She has not had chest pain or shortness of breath.  Past Medical History:  Diagnosis Date   Anemia    Anxiety    Depression    Family history of breast cancer    Family history of colon cancer    GERD (gastroesophageal reflux disease)    Headache    Obesity    Seizures (HCC)    Sleep apnea     Past Surgical History:  Procedure Laterality Date   ABDOMINAL HYSTERECTOMY     APPLICATION OF CRANIAL NAVIGATION N/A 02/08/2023   Procedure: APPLICATION OF CRANIAL NAVIGATION;  Surgeon: Jadene Pierini, MD;  Location: [facility] OR;  Service: Neurosurgery;  Laterality: N/A;   BREAST LUMPECTOMY Left 1997   CHOLECYSTECTOMY     CRANIOTOMY Left 02/08/2023   Procedure: Left craniotomy for tumor resection with stealth navigation;  Surgeon: Jadene Pierini, MD;  Location: [facility] OR;  Service: Neurosurgery;  Laterality: Left;   GASTRIC BYPASS  03/2017   THROAT SURGERY     removed scar tissue from throat d/t being intubated.    TUBAL LIGATION       Social History   Tobacco Use   Smoking status: Never   Smokeless tobacco: Never  Vaping Use   Vaping Use: Never used  Substance Use Topics   Alcohol use: No   Drug use: No      Review of Systems  A complete ROS was performed with pertinent positives/negatives noted in the HPI.   Vitals:   06/22/23 2156  06/22/23 2200  BP:  (!) 150/92  Pulse:  88  Resp:  14  Temp: 100.3 F (37.9 C) 99.5 F (37.5 C)  SpO2:  97%    Physical Exam Vitals and nursing note reviewed.  Constitutional:      General: She is not in acute distress.    Appearance: She is well-developed. She is not ill-appearing.  Eyes:     Conjunctiva/sclera: Conjunctivae normal.  Cardiovascular:     Rate and Rhythm: Normal rate and regular rhythm.     Pulses: Normal pulses.     Heart sounds: Normal heart sounds. No murmur heard.    No friction rub. No gallop.  Pulmonary:     Effort: Pulmonary effort is normal. No respiratory distress.     Breath sounds: Normal breath sounds. No stridor. No wheezing, rhonchi or rales.  Abdominal:     General: Abdomen is flat. There is no distension.     Palpations: Abdomen is soft.     Tenderness: There is no abdominal tenderness. There is no guarding or rebound.  Musculoskeletal:        General: No swelling.  Skin:    General: Skin is warm and dry.     Capillary Refill: Capillary refill takes less than 2 seconds.  Neurological:  Mental Status: She is alert.     Procedures  MDM:  Imaging/radiology results:  DG Chest Portable 1 View  Result Date: 06/22/2023 CLINICAL DATA:  Cough EXAM: PORTABLE CHEST 1 VIEW COMPARISON:  CT chest dated 03/09/2023 FINDINGS: Mild right basilar and left retrocardiac opacities, favoring chronic scarring/atelectasis when correlating with prior CT. Pneumonia is possible but not favored. No pleural effusion or pneumothorax. The heart is normal in size. IMPRESSION: Mild bilateral lower lobe opacities, favoring chronic scarring/atelectasis. Pneumonia is possible but not favored. Electronically Signed   By: Charline Bills M.D.   On: 06/22/2023 21:41    Lab results:  Results for orders placed or performed during the hospital encounter of 06/22/23 (from the past 24 hour(s))  CBC with Differential     Status: Abnormal   Collection Time: 06/22/23  6:28 PM   Result Value Ref Range   WBC 2.2 (L) 4.0 - 10.5 K/uL   RBC 4.22 3.87 - 5.11 MIL/uL   Hemoglobin 10.7 (L) 12.0 - 15.0 g/dL   HCT 16.1 (L) 09.6 - 04.5 %   MCV 80.1 80.0 - 100.0 fL   MCH 25.4 (L) 26.0 - 34.0 pg   MCHC 31.7 30.0 - 36.0 g/dL   RDW 40.9 (H) 81.1 - 91.4 %   Platelets 171 150 - 400 K/uL   nRBC 0.0 0.0 - 0.2 %   Neutrophils Relative % 66 %   Neutro Abs 1.5 (L) 1.7 - 7.7 K/uL   Lymphocytes Relative 24 %   Lymphs Abs 0.5 (L) 0.7 - 4.0 K/uL   Monocytes Relative 7 %   Monocytes Absolute 0.1 0.1 - 1.0 K/uL   Eosinophils Relative 1 %   Eosinophils Absolute 0.0 0.0 - 0.5 K/uL   Basophils Relative 1 %   Basophils Absolute 0.0 0.0 - 0.1 K/uL   Immature Granulocytes 1 %   Abs Immature Granulocytes 0.01 0.00 - 0.07 K/uL  Basic metabolic panel     Status: Abnormal   Collection Time: 06/22/23  6:28 PM  Result Value Ref Range   Sodium 136 135 - 145 mmol/L   Potassium 3.2 (L) 3.5 - 5.1 mmol/L   Chloride 103 98 - 111 mmol/L   CO2 23 22 - 32 mmol/L   Glucose, Bld 96 70 - 99 mg/dL   BUN 5 (L) 6 - 20 mg/dL   Creatinine, Ser 7.82 0.44 - 1.00 mg/dL   Calcium 8.6 (L) 8.9 - 10.3 mg/dL   GFR, Estimated >95 >62 mL/min   Anion gap 10 5 - 15  SARS Coronavirus 2 by RT PCR (hospital order, performed in [facility] hospital lab) *cepheid single result test* Anterior Nasal Swab     Status: None   Collection Time: 06/22/23  8:42 PM   Specimen: Anterior Nasal Swab  Result Value Ref Range   SARS Coronavirus 2 by RT PCR NEGATIVE NEGATIVE     Key medications administered in the ER:  Medications  potassium chloride SA (KLOR-CON M) CR tablet 40 mEq (40 mEq Oral Given 06/22/23 2041)  doxycycline (VIBRA-TABS) tablet 100 mg (100 mg Oral Given 06/22/23 2222)   Medical decision making: -Vital signs stable. Patient afebrile, hemodynamically stable, and non-toxic appearing. -Patient's presentation is most consistent with acute complicated illness / injury requiring diagnostic workup.. -Evelyn [name] is a 52 y.o. female presenting to the emergency department with symptoms as above with low white blood cell count.  Will plan for labs and chest x-ray given productive cough.  Patient is overall  nontoxic-appearing with vital signs not consistent with sepsis.  She has been saturating appropriately on room air.  She is afebrile with temperature less than 100.4.  COVID testing is negative.  BMP shows slight hypokalemia, given repletion in the emergency department.  CBC shows slightly low white blood cell count.  Also notable for mild neutropenia with neutrophil count 1500.  She has remained afebrile in the emergency department, no current concerns for neutropenic fever.  She is describing viral like symptoms with presentation most consistent with viral myelosuppression.  There is no evidence of aplastic anemia.  Chest x-rays been obtained and on my interpretation does show slight atelectasis versus opacities.  Given patient's clinical presentation and productive cough we will plan for conservative coverage for pneumonia.  Will proceed with doxycycline which will include atypical coverage.  Patient is stable for discharge at this time.  I discussed strict precautions for ED return.  I discussed follow-up with PCP for repeat labs.  Patient discharged.    Medical Decision Making Amount and/or Complexity of Data Reviewed Labs: ordered. Decision-making details documented in ED Course. Radiology: ordered and independent interpretation performed. Decision-making details documented in ED Course.  Risk Prescription drug management.     The plan for this patient was discussed with Dr. Particia Nearing, who voiced agreement and who oversaw evaluation and treatment of this patient.  Marta Lamas, MD Emergency Medicine, PGY-3  Note: Dragon medical dictation software was used in the creation of this note.   Clinical Impression:  1. Acute cough   2. Leukopenia, unspecified type     Rx / DC Orders ED  Discharge Orders          Ordered    doxycycline (VIBRAMYCIN) 100 MG capsule  2 times daily,   Status:  Discontinued        06/22/23 2210    doxycycline (VIBRAMYCIN) 100 MG capsule  2 times daily        06/22/23 2219               Chase Caller, MD 06/23/23 1610    Jacalyn Lefevre, MD 06/24/23 [phone]

## 2023-06-22 NOTE — Discharge Instructions (Signed)
Evelyn [name]:  Thank you for allowing [place] to take care of you today.  We hope you begin feeling better soon.  To-Do: Please follow-up with your primary doctor. Your chest x-ray showed a possible pneumonia.  Given your cough please complete the entire course of antibiotics. Follow-up with your PCP to have repeat labs drawn and ensure your lab abnormalities are improving. Please return to the Emergency Department or call 911 if you experience chest pain, shortness of breath, severe pain, severe fever, altered mental status, or have any reason to think that you need emergency medical care.  Thank you again.  Hope you feel better soon.  Department of Emergency Medicine [facility]

## 2023-07-11 ENCOUNTER — Ambulatory Visit: Payer: Medicaid Other | Admitting: Neurology

## 2023-07-11 ENCOUNTER — Encounter: Payer: Self-pay | Admitting: Neurology

## 2023-07-11 VITALS — BP 108/68 | HR 67 | Resp 17 | Ht 67.75 in

## 2023-07-11 DIAGNOSIS — G936 Cerebral edema: Secondary | ICD-10-CM | POA: Diagnosis not present

## 2023-07-11 DIAGNOSIS — G40909 Epilepsy, unspecified, not intractable, without status epilepticus: Secondary | ICD-10-CM

## 2023-07-11 DIAGNOSIS — D329 Benign neoplasm of meninges, unspecified: Secondary | ICD-10-CM | POA: Diagnosis not present

## 2023-07-11 DIAGNOSIS — Z5181 Encounter for therapeutic drug level monitoring: Secondary | ICD-10-CM | POA: Diagnosis not present

## 2023-07-11 DIAGNOSIS — G43709 Chronic migraine without aura, not intractable, without status migrainosus: Secondary | ICD-10-CM

## 2023-07-11 MED ORDER — LEVETIRACETAM 750 MG PO TABS: 750.0000 mg | ORAL_TABLET | Freq: Two times a day (BID) | ORAL | 3 refills | Status: DC

## 2023-07-11 MED ORDER — TOPIRAMATE 50 MG PO TABS: 50.0000 mg | ORAL_TABLET | Freq: Two times a day (BID) | ORAL | 3 refills | Status: DC

## 2023-07-11 NOTE — Progress Notes (Signed)
[facility]  PATIENT: Evelyn [name] DOB: [birth year]  REQUESTING CLINICIAN: Bryon Lions, PA-C HISTORY FROM: Patient  REASON FOR VISIT: Seizure like activity    HISTORICAL  CHIEF COMPLAINT:  Chief Complaint  Patient presents with   Follow-up    Rm13, mother  SZ:  last one around June. MIGRAINE: 1 in the past 30 days.Ha: 16 in 30 days.  triggers:stress, light, sound, perfume/aromas/. Pt reported memory issues    INTERVAL HISTORY 07/11/2023:  Patient presents today for follow-up, last visit was in January and at that time she was diagnosed with a left frontal meningioma which she did have resection from Dr. Johnsie Cancel.  After resection she did have radiotherapy because it was a grade 2 meningioma.  Since then he has she has headaches and a breakthrough seizure a month ago in the setting of missing her medication for 3 days. Patient reports that she does not feel like her normal self.  Due to the meningioma and the surgery she has lost her job, she does not have any income and this has been very stressful for her.  She is currently staying with her mother.  She is also not driving.    HISTORY OF PRESENT ILLNESS:  This is a 52 year old woman past medical history of sleep apnea currently not on CPAP machine, arthritis, migraines, obesity who is presenting with complaints of seizure-like activity.  Patient reports for the past 3 years, she has been experiencing episodes of behavioral arrest and losing track of time. These episodes have been more frequent to the point that she is getting 4-6 episodes per month.  Some days she woke up at home and be very confused, it will take her about 20-minutes to know where she is. She reports on 2 occasions, family members have witnessed these events.  The first event was when he was riding with his son.  She works as a Midwife and the son was with her at that time. She reports that she came to her son screaming at her and  asking her why she is making constant turn while driving.  The second event was when she was talking with a family member on the phone, she reports she was still driving and come to the family member screaming at her on the phone, calling her name.  She denies any jerk or jerk like activity, denies any injury, denies any tongue biting, urinary incontinence. She reports her last episode was on 12/29/22  Patient also reports a history of migraines since the age of 65, migraines went away after high school but lately have returned with nausea,. She reports for the past 3 months, she has daily headaches, bitemporal then migrate to frontal headaches with nausea. Headaches last all day. At time she will take OTC tylenol but she is not on any preventive medication.    Handedness: Right handed   Onset: 3 year ago, getting worse   Seizure Type: Staring, losing track of time  Current frequency: 4 to 6 episodes per month   Any injuries from seizures: Denies   Seizure risk factors: None   Previous ASMs: None   Currenty ASMs: Levetiracetam 750 mg BID and Topiramate 50 mg twice daily   ASMs side effects: N/A   Brain Images: None   Previous EEGs: None    OTHER MEDICAL CONDITIONS: Migraines, Arthritis, Sleep apnea   REVIEW OF SYSTEMS: Full 14 system review of systems performed and negative with exception of: As noted in the  HPI   ALLERGIES: Allergies  Allergen Reactions   Arimidex [Anastrozole]     Depleted wbc and neutrophils   Cymbalta [Duloxetine Hcl]     hallucinations   Dilaudid [Hydromorphone Hcl] Nausea And Vomiting   Morphine And Codeine Other (See Comments)    Fever and chills and convulsions   Vicodin [Hydrocodone-Acetaminophen] Nausea And Vomiting    Dizziness     HOME MEDICATIONS: Outpatient Medications Prior to Visit  Medication Sig Dispense Refill   acetaminophen (TYLENOL) 500 MG tablet Take 1,000 mg by mouth in the morning and at bedtime.      butalbital-acetaminophen-caffeine (FIORICET) 50-325-40 MG tablet Take 2 tablets by mouth every 6 (six) hours as needed for headache (first line for headache).   Not to exceed 6 tablets per day 60 tablet 0   calcium carbonate (TUMS - DOSED IN MG ELEMENTAL CALCIUM) 500 MG chewable tablet Chew 1 tablet (200 mg of elemental calcium total) by mouth 3 (three) times daily. 30 tablet 0   dextromethorphan-guaiFENesin (MUCINEX DM) 30-600 MG 12hr tablet Take 1 tablet by mouth 2 (two) times daily. 15 tablet 0   famotidine (PEPCID) 20 MG tablet Take 1 tablet (20 mg total) by mouth 2 (two) times daily. 60 tablet 0   methocarbamol (ROBAXIN) 500 MG tablet Take 1 tablet (500 mg total) by mouth every 6 (six) hours as needed for muscle spasms. 60 tablet 1   nystatin (MYCOSTATIN/NYSTOP) powder      nystatin cream (MYCOSTATIN) Apply topically 2 (two) times daily.     polyethylene glycol (MIRALAX / GLYCOLAX) 17 g packet Take 17 g by mouth 2 (two) times daily. 14 each 0   Vitamin D, Ergocalciferol, (DRISDOL) 1.25 MG (50000 UNIT) CAPS capsule Take 1 capsule (50,000 Units total) by mouth once a week. 5 capsule 0   levETIRAcetam (KEPPRA) 750 MG tablet Take 1 tablet (750 mg total) by mouth 2 (two) times daily. 60 tablet 0   topiramate (TOPAMAX) 50 MG tablet Take 1 tablet (50 mg total) by mouth 2 (two) times daily. 60 tablet 0   anastrozole (ARIMIDEX) 1 MG tablet Take 1 tablet (1 mg total) by mouth daily. 90 tablet 3   DULoxetine (CYMBALTA) 30 MG capsule Take 2 capsules (60mg ) by mouth daily at 8:00am and 1 capsule (30mg ) nightly at 10:00pm 90 capsule 0   Rivaroxaban (XARELTO) 15 MG TABS tablet Take 1 tablet (15 mg total) by mouth 2 (two) times daily with a meal for 12 days. (Patient not taking: Reported on 04/03/2023) 24 tablet 0   rivaroxaban (XARELTO) 20 MG TABS tablet Take 1 tablet (20 mg total) by mouth daily with supper. 30 tablet 1   No facility-administered medications prior to visit.    PAST MEDICAL HISTORY: Past  Medical History:  Diagnosis Date   Anemia    Anxiety    Depression    Family history of breast cancer    Family history of colon cancer    GERD (gastroesophageal reflux disease)    Headache    Obesity    Seizures (HCC)    Sleep apnea     PAST SURGICAL HISTORY: Past Surgical History:  Procedure Laterality Date   ABDOMINAL HYSTERECTOMY     APPLICATION OF CRANIAL NAVIGATION N/A 02/08/2023   Procedure: APPLICATION OF CRANIAL NAVIGATION;  Surgeon: Jadene Pierini, MD;  Location: [facility] OR;  Service: Neurosurgery;  Laterality: N/A;   BREAST LUMPECTOMY Left 1997   CHOLECYSTECTOMY     CRANIOTOMY Left 02/08/2023   Procedure: Left  craniotomy for tumor resection with stealth navigation;  Surgeon: Jadene Pierini, MD;  Location: [facility] OR;  Service: Neurosurgery;  Laterality: Left;   GASTRIC BYPASS  03/2017   THROAT SURGERY     removed scar tissue from throat d/t being intubated.    TUBAL LIGATION      FAMILY HISTORY: Family History  Problem Relation Age of Onset   Breast cancer Mother 62   Prostate cancer Father 40   Colon cancer Maternal Grandmother        d. 83   Prostate cancer Maternal Grandfather    Breast cancer Paternal Grandmother    Colon cancer Maternal Uncle 47   Cancer Maternal Uncle 60       NOS   Colon cancer Other        MGMs brother   Colon cancer Other        MGMs 2 sisters   Prostate cancer Other        PGFs 2 brothers   Brain cancer Paternal Uncle 15       Meningioma    SOCIAL HISTORY: Social History   Socioeconomic History   Marital status: Divorced    Spouse name: Not on file   Number of children: 3   Years of education: Not on file   Highest education level: Some college, no degree  Occupational History   Not on file  Tobacco Use   Smoking status: Never   Smokeless tobacco: Never  Vaping Use   Vaping status: Never Used  Substance and Sexual Activity   Alcohol use: No   Drug use: No   Sexual activity: Not Currently    Birth  control/protection: Surgical  Other Topics Concern   Not on file  Social History Narrative   Not on file   Social Determinants of Health   Financial Resource Strain: Medium Risk (06/20/2023)   Received from [facility], [facility]   Overall Financial Resource Strain (CARDIA)    Difficulty of Paying Living Expenses: Somewhat hard  Food Insecurity: No Food Insecurity (06/20/2023)   Received from [facility], [facility]   Hunger Vital Sign    Worried About Running Out of Food in the Last Year: Never true    Ran Out of Food in the Last Year: Never true  Recent Concern: Food Insecurity - Food Insecurity Present (03/31/2023)   Received from [facility]   Hunger Vital Sign    Worried About Running Out of Food in the Last Year: Sometimes true    Ran Out of Food in the Last Year: Sometimes true  Transportation Needs: No Transportation Needs (06/20/2023)   Received from [employer], [facility]   PRAPARE - Transportation    Lack of Transportation (Medical): No    Lack of Transportation (Non-Medical): No  Physical Activity: Unknown (06/20/2023)   Received from [facility], [facility]   Exercise Vital Sign    Days of Exercise per Week: 0 days    Minutes of Exercise per Session: Not on file  Stress: No Stress Concern Present (06/20/2023)   Received from [facility], [facility] of Occupational Health - Occupational Stress Questionnaire    Feeling of Stress : Only a little  Recent Concern: Stress - Stress Concern Present (03/31/2023)   Received from [facility] of Occupational Health - Occupational Stress Questionnaire    Feeling of Stress : To some extent  Social Connections: Moderately Integrated (06/20/2023)   [facility]   Social Network    How would you rate your social network (family, work, friends)?: Adequate participation with social networks  Intimate Partner Violence: Not At Risk  (06/20/2023)   Received from [facility], [facility]   HITS    Over the last 12 months how often did your partner physically hurt you?: 1    Over the last 12 months how often did your partner insult you or talk down to you?: 1    Over the last 12 months how often did your partner threaten you with physical harm?: 1    Over the last 12 months how often did your partner scream or curse at you?: 1    PHYSICAL EXAM  GENERAL EXAM/CONSTITUTIONAL: Vitals:  Vitals:   07/11/23 1439  BP: 108/68  Pulse: 67  Resp: 17  Height: 5' 7.75" (1.721 m)   Body mass index is 39.3 kg/m. Wt Readings from Last 3 Encounters:  06/05/23 256 lb 9.6 oz (116.4 kg)  04/03/23 273 lb 12.8 oz (124.2 kg)  03/09/23 280 lb (127 kg)   Patient is in no distress; well developed, nourished and groomed; neck is supple  MUSCULOSKELETAL: Gait, strength, tone, movements noted in Neurologic exam below  NEUROLOGIC: MENTAL STATUS:      No data to display         awake, alert, oriented to person, place and time recent and remote memory intact normal attention and concentration language fluent, comprehension intact, naming intact fund of knowledge appropriate  CRANIAL NERVE:  2nd, 3rd, 4th, 6th - Visual fields full to confrontation, extraocular muscles intact, no nystagmus 5th - facial sensation symmetric 7th - facial strength symmetric 8th - hearing intact 9th - palate elevates symmetrically, uvula midline 11th - shoulder shrug symmetric 12th - tongue protrusion midline  MOTOR:  normal bulk and tone, full strength in the BUE, BLE  SENSORY:  normal and symmetric to light touch  COORDINATION:  finger-nose-finger, fine finger movements norma  GAIT/STATION:  normal   DIAGNOSTIC DATA (LABS, IMAGING, TESTING) - I reviewed patient records, labs, notes, testing and imaging myself where available.  Lab Results  Component Value Date   WBC 2.2 (L) 06/22/2023   HGB 10.7 (L) 06/22/2023   HCT 33.8 (L)  06/22/2023   MCV 80.1 06/22/2023   PLT 171 06/22/2023      Component Value Date/Time   NA 136 06/22/2023 1828   K 3.2 (L) 06/22/2023 1828   CL 103 06/22/2023 1828   CO2 23 06/22/2023 1828   GLUCOSE 96 06/22/2023 1828   BUN 5 (L) 06/22/2023 1828   CREATININE 0.71 06/22/2023 1828   CALCIUM 8.6 (L) 06/22/2023 1828   PROT 6.2 (L) 03/13/2023 0043   ALBUMIN 2.3 (L) 03/13/2023 0043   AST 22 03/13/2023 0043   ALT 16 03/13/2023 0043   ALKPHOS 54 03/13/2023 0043   BILITOT 0.4 03/13/2023 0043   GFRNONAA >60 06/22/2023 1828   GFRAA >60 12/07/2019 2340   No results found for: "CHOL", "HDL", "LDLCALC", "LDLDIRECT", "TRIG" No results found for: "HGBA1C" Lab Results  Component Value Date   VITAMINB12 458 01/01/2011   Lab Results  Component Value Date   TSH 1.684 03/29/2023   Routine EEG 01/18/2023: Normal   Routine EEG 03/12/2023: Normal   MRI Brain 01/17/2023 MRI scan of the brain with and without contrast showing a large left orbital frontal meningeal base mass with intense enhancement with mild cytotoxic edema and left-to-right brain herniation 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 No purulence suggested by diffusion imaging, sterility is indeterminate in the setting of fever. 2. Small volume residual tumor along the fovea ethmoidalis and olfactory recess.   ASSESSMENT AND PLAN  52 y.o. year old female  with sleep apnea currently not on CPAP machine, arthritis, migraines, obesity who is presenting for follow-up for her seizure and recently diagnosed meningioma.  Her meningioma was resected, she did have radiotherapy following resection.  She did have a breakthrough seizure in the setting of missing her medication for 3 days.  Currently her main symptoms are increased stress, lack of income, headaches and some tiredness.  She  feels like she is not her normal self.  I did explain to the patient that due to surgery and radiation therapy she will need more time to recover but she should improve.  Plan for now is to continue the antiseizure medications Keppra and Topamax and also I will repeat his her MRI brain.  I will continue to see her in 6-month but she understands to contact me sooner if worse or any other concern.   1. Seizure disorder (HCC)   2. Therapeutic drug monitoring   3. Meningioma (HCC)   4. Brain edema (HCC)   5. Chronic migraine without aura without status migrainosus, not intractable      Patient Instructions  Continue levetiracetam 750 mg twice daily Continue with topiramate 50 mg twice daily Will check antiseizure medication level today Continue follow-up with your doctors Return in 6 months or sooner if worse. Due to recent seizure in June you wont be able to drive until January of next year.   Per [facility] statutes, patients with seizures are not allowed to drive until they have been seizure-free for six months.  Other recommendations include using caution when using heavy equipment or power tools. Avoid working on ladders or at heights. Take showers instead of baths.  Do not swim alone.  Ensure the water temperature is not too high on the home water heater. Do not go swimming alone. Do not lock yourself in a room alone (i.e. bathroom). When caring for infants or small children, sit down when holding, feeding, or changing them to minimize risk of injury to the child in the event you have a seizure. Maintain good sleep hygiene. Avoid alcohol.  Also recommend adequate sleep, hydration, good diet and minimize stress.   During the Seizure  - First, ensure adequate ventilation and place patients on the floor on their left side  Loosen clothing around the neck and ensure the airway is patent. If the patient is clenching the teeth, do not force the mouth open with any object as this can  cause severe damage - Remove all items from the surrounding that can be hazardous. The patient may be oblivious to what's happening and may not even know what he or she is doing. If the patient is confused and wandering, either gently guide him/her away and block access to outside areas - Reassure the individual and be comforting - Call 911. In most cases, the seizure ends before EMS arrives. However, there are cases when seizures may last over 3 to 5 minutes. Or the individual may have developed breathing difficulties or severe injuries. If a pregnant patient or a person with diabetes develops a seizure, it is prudent to call an ambulance. - Finally, if the patient does not regain  full consciousness, then call EMS. Most patients will remain confused for about 45 to 90 minutes after a seizure, so you must use judgment in calling for help. - Avoid restraints but make sure the patient is in a bed with padded side rails - Place the individual in a lateral position with the neck slightly flexed; this will help the saliva drain from the mouth and prevent the tongue from falling backward - Remove all nearby furniture and other hazards from the area - Provide verbal assurance as the individual is regaining consciousness - Provide the patient with privacy if possible - Call for help and start treatment as ordered by the caregiver   After the Seizure (Postictal Stage)  After a seizure, most patients experience confusion, fatigue, muscle pain and/or a headache. Thus, one should permit the individual to sleep. For the next few days, reassurance is essential. Being calm and helping reorient the person is also of importance.  Most seizures are painless and end spontaneously. Seizures are not harmful to others but can lead to complications such as stress on the lungs, brain and the heart. Individuals with prior lung problems may develop labored breathing and respiratory distress.     Orders Placed This  Encounter  Procedures   MR BRAIN W WO CONTRAST   Levetiracetam level   Topiramate Level    Meds ordered this encounter  Medications   levETIRAcetam (KEPPRA) 750 MG tablet    Sig: Take 1 tablet (750 mg total) by mouth 2 (two) times daily.    Dispense:  180 tablet    Refill:  3   topiramate (TOPAMAX) 50 MG tablet    Sig: Take 1 tablet (50 mg total) by mouth 2 (two) times daily.    Dispense:  180 tablet    Refill:  3    Return in about 6 months (around 01/11/2024).    Windell Norfolk, MD 07/11/2023, 6:06 PM  [facility] [address] [place], [place] [address] [phone]

## 2023-07-11 NOTE — Patient Instructions (Signed)
Continue levetiracetam 750 mg twice daily Continue with topiramate 50 mg twice daily Will check antiseizure medication level today Continue follow-up with your doctors Return in 6 months or sooner if worse. Due to recent seizure in June you wont be able to drive until January of next year.

## 2023-07-12 ENCOUNTER — Telehealth: Payer: Self-pay | Admitting: Neurology

## 2023-07-12 NOTE — Telephone Encounter (Signed)
[employer] medicaid Berkley Harvey: [id number] exp. 07/12/23-08/26/23 sent to GI [phone]

## 2023-07-13 LAB — LEVETIRACETAM LEVEL: Levetiracetam Lvl: 26.8 ug/mL (ref 10.0–40.0)

## 2023-07-13 LAB — TOPIRAMATE LEVEL: Topiramate Lvl: 4.7 ug/mL (ref 2.0–25.0)

## 2023-07-20 ENCOUNTER — Other Ambulatory Visit: Payer: Self-pay | Admitting: Radiation Therapy

## 2023-07-20 DIAGNOSIS — D329 Benign neoplasm of meninges, unspecified: Secondary | ICD-10-CM

## 2023-09-04 ENCOUNTER — Ambulatory Visit
Admission: RE | Admit: 2023-09-04 | Discharge: 2023-09-04 | Disposition: A | Discharge status: Home / Self Care | Payer: Medicaid Other | Source: Ambulatory Visit | Attending: Radiation Oncology | Admitting: Radiation Oncology

## 2023-09-04 ENCOUNTER — Inpatient Hospital Stay: Payer: Medicaid Other | Attending: Hematology and Oncology | Admitting: Hematology and Oncology

## 2023-09-04 DIAGNOSIS — Z803 Family history of malignant neoplasm of breast: Secondary | ICD-10-CM | POA: Diagnosis not present

## 2023-09-04 DIAGNOSIS — Z9189 Other specified personal risk factors, not elsewhere classified: Secondary | ICD-10-CM | POA: Diagnosis not present

## 2023-09-04 DIAGNOSIS — D420 Neoplasm of uncertain behavior of cerebral meninges: Secondary | ICD-10-CM | POA: Insufficient documentation

## 2023-09-04 DIAGNOSIS — Z79899 Other long term (current) drug therapy: Secondary | ICD-10-CM | POA: Insufficient documentation

## 2023-09-04 DIAGNOSIS — D329 Benign neoplasm of meninges, unspecified: Secondary | ICD-10-CM

## 2023-09-04 MED ORDER — GADOPICLENOL 0.5 MMOL/ML IV SOLN
10.0000 mL | Freq: Once | INTRAVENOUS | Status: AC | PRN
  Administered 2023-09-04: 10 mL via INTRAVENOUS

## 2023-09-04 NOTE — Progress Notes (Signed)
[facility] CONSULT NOTE  Patient Care Team: Rosemary Holms as PCP - General (Physician Assistant)  CHIEF COMPLAINTS/PURPOSE OF CONSULTATION:  At high risk for breast cancer  ASSESSMENT & PLAN:   This is a 52 yr old with PMH significant for anxiety/depression, atypical meningiomas s/p resection referred to high risk breast given her family history of breast cancer and increased lifetime risk.  We discussed her life time risk of breast cancer and 5 yr risk of breast cancer today which is 27% and 2.2% during her last visit here, she wanted to try the anastrozole for breast cancer prevention but apparently she could not tolerate it and she was taken off of it since she had a pneumonia and it was thought that the anastrozole had immunocompromised her.  I have discussed that typically this is not a side effect of anastrozole, it does not cause any leukopenia or immunocompromise status.  However if the patient wants to continue with active surveillance alone, this is definitely a reasonable recommendation.  She is also due for her mammogram I have asked her to contact breast center and have this scheduled.  She is interested in pursuing intensive screening with MRIs which has been ordered for spring 2025.  She will otherwise return to clinic for follow-up with [place] once a year, we will see her back in June 2025 unless any new questions arise.  HISTORY OF PRESENTING ILLNESS:  Evelyn [name] 52 y.o. female is here because of family history of breast cancer.  This is a very pleasant 52 yr old female with FH of breast cancer in mom in late 74's, paternal grandmother with breast cancer at unknown age, maternal grandmother with colon cancer, paternal great grand mother with breast cancer, paternal cousin in 30's with pancreatic cancer referred to high risk breast clinic. She was recently diagnosed with atypical meningioma had surgical resection followed by radiation.   This is a  follow-up telephone call to see how the patient is doing on anastrozole.  She apparently took it for few days and had a pneumonia and she was taken off of the medication since it was thought that it may be immunocompromising her.  She has hence not taken it again. Rest of the pertinent 10 point ROS reviewed and negative  REVIEW OF SYSTEMS:   Constitutional: Denies fevers, chills or abnormal night sweats Eyes: Denies blurriness of vision, double vision or watery eyes Ears, nose, mouth, throat, and face: Denies mucositis or sore throat Respiratory: Denies cough, dyspnea or wheezes Cardiovascular: Denies palpitation, chest discomfort or lower extremity swelling Gastrointestinal:  Denies nausea, heartburn or change in bowel habits Skin: Denies abnormal skin rashes Lymphatics: Denies new lymphadenopathy or easy bruising Neurological:Denies numbness, tingling or new weaknesses Behavioral/Psych: Mood is stable, no new changes  All other systems were reviewed with the patient and are negative.  MEDICAL HISTORY:  Past Medical History:  Diagnosis Date   Anemia    Anxiety    Depression    Family history of breast cancer    Family history of colon cancer    GERD (gastroesophageal reflux disease)    Headache    Obesity    Seizures (HCC)    Sleep apnea     SURGICAL HISTORY: Past Surgical History:  Procedure Laterality Date   ABDOMINAL HYSTERECTOMY     APPLICATION OF CRANIAL NAVIGATION N/A 02/08/2023   Procedure: APPLICATION OF CRANIAL NAVIGATION;  Surgeon: Jadene Pierini, MD;  Location: [facility] OR;  Service: Neurosurgery;  Laterality: N/A;   BREAST LUMPECTOMY Left 1997   CHOLECYSTECTOMY     CRANIOTOMY Left 02/08/2023   Procedure: Left craniotomy for tumor resection with stealth navigation;  Surgeon: Jadene Pierini, MD;  Location: [facility] OR;  Service: Neurosurgery;  Laterality: Left;   GASTRIC BYPASS  03/2017   THROAT SURGERY     removed scar tissue from throat d/t being intubated.     TUBAL LIGATION      SOCIAL HISTORY: Social History   Socioeconomic History   Marital status: Divorced    Spouse name: Not on file   Number of children: 3   Years of education: Not on file   Highest education level: Some college, no degree  Occupational History   Not on file  Tobacco Use   Smoking status: Never   Smokeless tobacco: Never  Vaping Use   Vaping status: Never Used  Substance and Sexual Activity   Alcohol use: No   Drug use: No   Sexual activity: Not Currently    Birth control/protection: Surgical  Other Topics Concern   Not on file  Social History Narrative   Not on file   Social Determinants of Health   Financial Resource Strain: Medium Risk (06/20/2023)   Received from [facility], [facility]   Overall Financial Resource Strain (CARDIA)    Difficulty of Paying Living Expenses: Somewhat hard  Food Insecurity: No Food Insecurity (06/20/2023)   Received from [facility], [facility]   Hunger Vital Sign    Worried About Running Out of Food in the Last Year: Never true    Ran Out of Food in the Last Year: Never true  Recent Concern: Food Insecurity - Food Insecurity Present (03/31/2023)   Received from [facility]   Hunger Vital Sign    Worried About Running Out of Food in the Last Year: Sometimes true    Ran Out of Food in the Last Year: Sometimes true  Transportation Needs: No Transportation Needs (06/20/2023)   Received from [employer], [facility]   PRAPARE - Transportation    Lack of Transportation (Medical): No    Lack of Transportation (Non-Medical): No  Physical Activity: Unknown (06/20/2023)   Received from [facility], [facility]   Exercise Vital Sign    Days of Exercise per Week: 0 days    Minutes of Exercise per Session: Not on file  Stress: No Stress Concern Present (06/20/2023)   Received from [facility], [facility] of Occupational Health - Occupational Stress Questionnaire    Feeling of Stress :  Only a little  Recent Concern: Stress - Stress Concern Present (03/31/2023)   Received from [facility] of Occupational Health - Occupational Stress Questionnaire    Feeling of Stress : To some extent  Social Connections: Moderately Integrated (06/20/2023)   Received from [facility], [facility]   Social Network    How would you rate your social network (family, work, friends)?: Adequate participation with social networks  Intimate Partner Violence: Not At Risk (06/20/2023)   Received from [facility], [facility]   HITS    Over the last 12 months how often did your partner physically hurt you?: 1    Over the last 12 months how often did your partner insult you or talk down to you?: 1    Over the last 12 months how often did your partner threaten you with physical harm?: 1    Over the last 12  months how often did your partner scream or curse at you?: 1    FAMILY HISTORY: Family History  Problem Relation Age of Onset   Breast cancer Mother 58   Prostate cancer Father 19   Colon cancer Maternal Grandmother        d. 90   Prostate cancer Maternal Grandfather    Breast cancer Paternal Grandmother    Colon cancer Maternal Uncle 42   Cancer Maternal Uncle 60       NOS   Colon cancer Other        MGMs brother   Colon cancer Other        MGMs 2 sisters   Prostate cancer Other        PGFs 2 brothers   Brain cancer Paternal Uncle 15       Meningioma    ALLERGIES:  is allergic to arimidex [anastrozole], cymbalta [duloxetine hcl], dilaudid [hydromorphone hcl], morphine and codeine, and vicodin [hydrocodone-acetaminophen].  MEDICATIONS:  Current Outpatient Medications  Medication Sig Dispense Refill   acetaminophen (TYLENOL) 500 MG tablet Take 1,000 mg by mouth in the morning and at bedtime.     butalbital-acetaminophen-caffeine (FIORICET) 50-325-40 MG tablet Take 2 tablets by mouth every 6 (six) hours as needed for headache (first line for headache).   Not  to exceed 6 tablets per day 60 tablet 0   calcium carbonate (TUMS - DOSED IN MG ELEMENTAL CALCIUM) 500 MG chewable tablet Chew 1 tablet (200 mg of elemental calcium total) by mouth 3 (three) times daily. 30 tablet 0   dextromethorphan-guaiFENesin (MUCINEX DM) 30-600 MG 12hr tablet Take 1 tablet by mouth 2 (two) times daily. 15 tablet 0   famotidine (PEPCID) 20 MG tablet Take 1 tablet (20 mg total) by mouth 2 (two) times daily. 60 tablet 0   levETIRAcetam (KEPPRA) 750 MG tablet Take 1 tablet (750 mg total) by mouth 2 (two) times daily. 180 tablet 3   methocarbamol (ROBAXIN) 500 MG tablet Take 1 tablet (500 mg total) by mouth every 6 (six) hours as needed for muscle spasms. 60 tablet 1   nystatin (MYCOSTATIN/NYSTOP) powder      nystatin cream (MYCOSTATIN) Apply topically 2 (two) times daily.     polyethylene glycol (MIRALAX / GLYCOLAX) 17 g packet Take 17 g by mouth 2 (two) times daily. 14 each 0   topiramate (TOPAMAX) 50 MG tablet Take 1 tablet (50 mg total) by mouth 2 (two) times daily. 180 tablet 3   Vitamin D, Ergocalciferol, (DRISDOL) 1.25 MG (50000 UNIT) CAPS capsule Take 1 capsule (50,000 Units total) by mouth once a week. 5 capsule 0   No current facility-administered medications for this visit.     PHYSICAL EXAMINATION: ECOG PERFORMANCE STATUS: 0 - Asymptomatic  There were no vitals taken for this visit. Physical exam deferred, telephone visit LABORATORY DATA:  I have reviewed the data as listed Lab Results  Component Value Date   WBC 2.2 (L) 06/22/2023   HGB 10.7 (L) 06/22/2023   HCT 33.8 (L) 06/22/2023   MCV 80.1 06/22/2023   PLT 171 06/22/2023     Chemistry      Component Value Date/Time   NA 136 06/22/2023 1828   K 3.2 (L) 06/22/2023 1828   CL 103 06/22/2023 1828   CO2 23 06/22/2023 1828   BUN 5 (L) 06/22/2023 1828   CREATININE 0.71 06/22/2023 1828      Component Value Date/Time   CALCIUM 8.6 (L) 06/22/2023 1828   ALKPHOS 54 03/13/2023  0043   AST 22 03/13/2023  0043   ALT 16 03/13/2023 0043   BILITOT 0.4 03/13/2023 0043       RADIOGRAPHIC STUDIES: I have personally reviewed the radiological images as listed and agreed with the findings in the report. No results found.  All questions were answered. The patient knows to call the clinic with any problems, questions or concerns. I spent 12 minutes in the care of this patient including H and P, review of records, counseling and coordination of care.   I connected with  Evelyn [name] on 09/04/23 by a telephone application and verified that I am speaking with the correct person using two identifiers.   I discussed the limitations of evaluation and management by telemedicine. The patient expressed understanding and agreed to proceed.   Rachel Moulds, MD 09/04/2023 4:25 PM

## 2023-09-10 ENCOUNTER — Inpatient Hospital Stay: Payer: Medicaid Other

## 2023-09-10 ENCOUNTER — Inpatient Hospital Stay (HOSPITAL_BASED_OUTPATIENT_CLINIC_OR_DEPARTMENT_OTHER): Payer: Medicaid Other | Admitting: Internal Medicine

## 2023-09-10 VITALS — BP 117/65 | HR 71 | Temp 97.3°F | Resp 20 | Wt 238.5 lb

## 2023-09-10 DIAGNOSIS — D420 Neoplasm of uncertain behavior of cerebral meninges: Secondary | ICD-10-CM

## 2023-09-10 DIAGNOSIS — D320 Benign neoplasm of cerebral meninges: Secondary | ICD-10-CM

## 2023-09-10 DIAGNOSIS — Z79899 Other long term (current) drug therapy: Secondary | ICD-10-CM | POA: Diagnosis not present

## 2023-09-10 NOTE — Progress Notes (Signed)
[facility] [address]  [place], [place] [address] [phone]   New [name] Evaluation  Date of Service: 09/10/23 [name] Name: Evelyn [name] [name] MRN: [id number] [name] DOB: 1971/05/07 Provider: Henreitta Leber, MD  Identifying Statement:  Evelyn [name] is a 51 y.o. 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    Referring Provider: Bryon Lions, PA-C [address] [place],  [place] [address]  Oncologic History: 02/08/23: Craniotomy, resection with Dr. Maurice Small; path is meningioma WHO 2 05/09/23: Completes 6 weeks IMRT with Dr. Basilio Cairo to resection bed  Biomarkers: n/a  History of Present Illness: Evelyn [name]'s records from Evelyn referring physician were obtained and reviewed and Evelyn [name] interviewed to confirm this HPI.  Evelyn [name] presents today for follow up after having completed radiation, post-RT MRI study.  Evelyn [name] denies any frank seizures in recent weeks.  Daily moderate headaches have been a concern, Evelyn [name] has been dosing Tylenol daily for months.  No significant issues with gait, did have a provoked fall earlier this morning.  Continues on Keppra 766m twice per day and Topamax 50mg  twice per day.  Medications: Current Outpatient Medications on File Prior to Visit  Medication Sig Dispense Refill   acetaminophen (TYLENOL) 500 MG tablet Take 1,000 mg by mouth in Evelyn morning and at bedtime.     butalbital-acetaminophen-caffeine (FIORICET) 50-325-40 MG tablet Take 2 tablets by mouth every 6 (six) hours as needed for headache (first line for headache).   Not to exceed 6 tablets per day 60 tablet 0   calcium carbonate (TUMS - DOSED IN MG ELEMENTAL CALCIUM) 500 MG chewable tablet Chew 1 tablet (200 mg of elemental calcium total) by mouth 3 (three) times daily. 30 tablet 0   dextromethorphan-guaiFENesin (MUCINEX DM) 30-600 MG 12hr tablet Take  1 tablet by mouth 2 (two) times daily. 15 tablet 0   famotidine (PEPCID) 20 MG tablet Take 1 tablet (20 mg total) by mouth 2 (two) times daily. 60 tablet 0   levETIRAcetam (KEPPRA) 750 MG tablet Take 1 tablet (750 mg total) by mouth 2 (two) times daily. 180 tablet 3   methocarbamol (ROBAXIN) 500 MG tablet Take 1 tablet (500 mg total) by mouth every 6 (six) hours as needed for muscle spasms. 60 tablet 1   nystatin (MYCOSTATIN/NYSTOP) powder      nystatin cream (MYCOSTATIN) Apply topically 2 (two) times daily.     polyethylene glycol (MIRALAX / GLYCOLAX) 17 g packet Take 17 g by mouth 2 (two) times daily. 14 each 0   topiramate (TOPAMAX) 50 MG tablet Take 1 tablet (50 mg total) by mouth 2 (two) times daily. 180 tablet 3   Vitamin D, Ergocalciferol, (DRISDOL) 1.25 MG (50000 UNIT) CAPS capsule Take 1 capsule (50,000 Units total) by mouth once a week. 5 capsule 0   [DISCONTINUED] omeprazole (PRILOSEC) 20 MG capsule Take 1 capsule (20 mg total) by mouth daily. ([name] not taking: Reported on 01/16/2017) 60 capsule 1   [DISCONTINUED] phentermine 37.5 MG capsule Take 37.5 mg by mouth every morning.     No current facility-administered medications on file prior to visit.    Allergies:  Allergies  Allergen Reactions   Anastrozole Other (See Comments)    Depleted wbc and neutrophils   Cymbalta [Duloxetine Hcl]     hallucinations   Dilaudid [Hydromorphone Hcl] Nausea And Vomiting   Morphine And Codeine Other (See Comments)  Fever and chills and convulsions   Vicodin [Hydrocodone-Acetaminophen] Nausea And Vomiting    Dizziness    Past Medical History:  Past Medical History:  Diagnosis Date   Anemia    Anxiety    Depression    Family history of breast cancer    Family history of colon cancer    GERD (gastroesophageal reflux disease)    Headache    Obesity    Seizures (HCC)    Sleep apnea    Past Surgical History:  Past Surgical History:  Procedure Laterality Date   ABDOMINAL  HYSTERECTOMY     APPLICATION OF CRANIAL NAVIGATION N/A 02/08/2023   Procedure: APPLICATION OF CRANIAL NAVIGATION;  Surgeon: Jadene Pierini, MD;  Location: [facility] OR;  Service: Neurosurgery;  Laterality: N/A;   BREAST LUMPECTOMY Left 1997   CHOLECYSTECTOMY     CRANIOTOMY Left 02/08/2023   Procedure: Left craniotomy for tumor resection with stealth navigation;  Surgeon: Jadene Pierini, MD;  Location: [facility] OR;  Service: Neurosurgery;  Laterality: Left;   GASTRIC BYPASS  03/2017   THROAT SURGERY     removed scar tissue from throat d/t being intubated.    TUBAL LIGATION     Social History:  Social History   Socioeconomic History   Marital status: Divorced    Spouse name: Not on file   Number of children: 3   Years of education: Not on file   Highest education level: Some college, no degree  Occupational History   Not on file  Tobacco Use   Smoking status: Never   Smokeless tobacco: Never  Vaping Use   Vaping status: Never Used  Substance and Sexual Activity   Alcohol use: No   Drug use: No   Sexual activity: Not Currently    Birth control/protection: Surgical  Other Topics Concern   Not on file  Social History Narrative   Not on file   Social Determinants of Health   Financial Resource Strain: Medium Risk (06/20/2023)   Received from [facility], [facility]   Overall Financial Resource Strain (CARDIA)    Difficulty of Paying Living Expenses: Somewhat hard  Food Insecurity: No Food Insecurity (06/20/2023)   Received from [facility], [facility]   Hunger Vital Sign    Worried About Running Out of Food in Evelyn Last Year: Never true    Ran Out of Food in Evelyn Last Year: Never true  Recent Concern: Food Insecurity - Food Insecurity Present (03/31/2023)   Received from [facility]   Hunger Vital Sign    Worried About Running Out of Food in Evelyn Last Year: Sometimes true    Ran Out of Food in Evelyn Last Year: Sometimes true  Transportation Needs: No Transportation  Needs (06/20/2023)   Received from [employer], [facility]   PRAPARE - Transportation    Lack of Transportation (Medical): No    Lack of Transportation (Non-Medical): No  Physical Activity: Unknown (06/20/2023)   Received from [facility], [facility]   Exercise Vital Sign    Days of Exercise per Week: 0 days    Minutes of Exercise per Session: Not on file  Stress: No Stress Concern Present (06/20/2023)   Received from [facility], [facility] of Occupational Health - Occupational Stress Questionnaire    Feeling of Stress : Only a little  Recent Concern: Stress - Stress Concern Present (03/31/2023)   Received from [facility] of Occupational Health - Occupational Stress  Questionnaire    Feeling of Stress : To some extent  Social Connections: Moderately Integrated (06/20/2023)   Received from [facility], [facility]   Social Network    How would you rate your social network (family, work, friends)?: Adequate participation with social networks  Intimate Partner Violence: Not At Risk (06/20/2023)   Received from [facility], [facility]   HITS    Over Evelyn last 12 months how often did your partner physically hurt you?: 1    Over Evelyn last 12 months how often did your partner insult you or talk down to you?: 1    Over Evelyn last 12 months how often did your partner threaten you with physical harm?: 1    Over Evelyn last 12 months how often did your partner scream or curse at you?: 1   Family History:  Family History  Problem Relation Age of Onset   Breast cancer Mother 88   Prostate cancer Father 30   Colon cancer Maternal Grandmother        d. 3   Prostate cancer Maternal Grandfather    Breast cancer Paternal Grandmother    Colon cancer Maternal Uncle 67   Cancer Maternal Uncle 60       NOS   Colon cancer Other        MGMs brother   Colon cancer Other        MGMs 2 sisters   Prostate cancer Other        PGFs 2 brothers    Brain cancer Paternal Uncle 15       Meningioma    Review of Systems: Constitutional: Doesn't report fevers, chills or abnormal weight loss Eyes: Doesn't report blurriness of vision Ears, nose, mouth, throat, and face: Doesn't report sore throat Respiratory: Doesn't report cough, dyspnea or wheezes Cardiovascular: Doesn't report palpitation, chest discomfort  Gastrointestinal:  Doesn't report nausea, constipation, diarrhea GU: Doesn't report incontinence Skin: Doesn't report skin rashes Neurological: Per HPI Musculoskeletal: Doesn't report joint pain Behavioral/Psych: Doesn't report anxiety  Physical Exam: Vitals:   09/10/23 1135  BP: 117/65  Pulse: 71  Resp: 20  Temp: (!) 97.3 F (36.3 C)  SpO2: 100%   KPS: 90. General: Alert, cooperative, pleasant, in no acute distress Head: Normal EENT: No conjunctival injection or scleral icterus.  Lungs: Resp effort normal Cardiac: Regular rate Abdomen: Non-distended abdomen Skin: No rashes cyanosis or petechiae. Extremities: No clubbing or edema  Neurologic Exam: Mental Status: Awake, alert, attentive to examiner. Oriented to self and environment. Language is fluent with intact comprehension.  Cranial Nerves: Visual acuity is grossly normal. Visual fields are full. Extra-ocular movements intact. No ptosis. Face is symmetric Motor: Tone and bulk are normal. Power is full in both arms and legs. Reflexes are symmetric, no pathologic reflexes present.  Sensory: Intact to light touch Gait: Normal.   Labs: I have reviewed Evelyn data as listed    Component Value Date/Time   NA 136 06/22/2023 1828   K 3.2 (L) 06/22/2023 1828   CL 103 06/22/2023 1828   CO2 23 06/22/2023 1828   GLUCOSE 96 06/22/2023 1828   BUN 5 (L) 06/22/2023 1828   CREATININE 0.71 06/22/2023 1828   CALCIUM 8.6 (L) 06/22/2023 1828   PROT 6.2 (L) 03/13/2023 0043   ALBUMIN 2.3 (L) 03/13/2023 0043   AST 22 03/13/2023 0043   ALT 16 03/13/2023 0043   ALKPHOS 54  03/13/2023 0043   BILITOT 0.4 03/13/2023 0043   GFRNONAA >60 06/22/2023 1828  GFRAA >60 12/07/2019 2340   Lab Results  Component Value Date   WBC 2.2 (L) 06/22/2023   NEUTROABS 1.5 (L) 06/22/2023   HGB 10.7 (L) 06/22/2023   HCT 33.8 (L) 06/22/2023   MCV 80.1 06/22/2023   PLT 171 06/22/2023    Imaging: [facility] Clinician Interpretation: I have personally reviewed Evelyn CNS images as listed.  My interpretation, in Evelyn context of Evelyn [name]'s clinical presentation, is treatment effect vs true progression pending official read.  Enhancement at treatment site may be secondary to collapse of surgical cavity (favored) vs residual or recurrent tumor.  No results found.  Pathology:  Assessment/Plan Meningioma, cerebral [facility]) - Plan: MR BRAIN W WO CONTRAST  Atypical intracranial meningioma (HCC)  We appreciate Evelyn opportunity to participate in Evelyn care of [facility].  Evelyn [name] is clinically stable today with regards to [facility] 2 meningioma.  MRI demonstrates thickening nodularity and enhancement at Evelyn resection cavity and RT treatment site; this is favored secondary to collapse of surgical cavity rather than progressive or recurrent tumor, which would be unlikely at this early stage.    Recommended continued close imaging surveillance, with repeat MRI in 3 months.    Should continue Keppra 750mg  BID and Topamax 50mg  BID for seizure prevention.  Headaches may be secondary to chronic dosing of Tylenol, recommended gradually weaning analgesia.  Screening for potential clinical trials was performed and discussed using eligibility criteria for active protocols at [facility], loco-regional tertiary centers, as well as national database available on GroundTransfer.at.    Evelyn [name] is not a candidate for a research protocol at this time due to no suitable study identified.   We spent twenty additional minutes teaching regarding Evelyn natural history, biology, and historical experience in Evelyn  treatment of brain tumors. We then discussed in detail Evelyn current recommendations for therapy focusing on Evelyn mode of administration, mechanism of action, anticipated toxicities, and quality of life issues associated with this plan. We also provided teaching sheets for Evelyn [name] to take home as an additional resource.  We ask that Evelyn [name] return to clinic in 3 months following next brain MRI, or sooner as needed.  All questions were answered. Evelyn [name] knows to call Evelyn clinic with any problems, questions or concerns. No barriers to learning were detected.  Evelyn total time spent in Evelyn encounter was 60 minutes and more than 50% was on counseling and review of test results   Henreitta Leber, MD Medical Director of Neuro-Oncology [facility] 09/10/23 12:24 PM

## 2023-10-04 ENCOUNTER — Other Ambulatory Visit: Payer: Self-pay | Admitting: Neurology

## 2023-10-31 ENCOUNTER — Telehealth: Payer: Self-pay | Admitting: Hematology and Oncology

## 2023-10-31 NOTE — Telephone Encounter (Signed)
Patient is aware of rescheduled appointment times/dates for follow up appointment

## 2023-12-06 ENCOUNTER — Ambulatory Visit: Payer: Medicaid Other | Admitting: Internal Medicine

## 2023-12-06 ENCOUNTER — Ambulatory Visit: Payer: Medicaid Other | Admitting: Hematology and Oncology

## 2023-12-07 ENCOUNTER — Ambulatory Visit (HOSPITAL_COMMUNITY)
Admission: RE | Admit: 2023-12-07 | Discharge: 2023-12-07 | Disposition: A | Discharge status: Home / Self Care | Payer: Medicaid Other | Source: Ambulatory Visit | Attending: Internal Medicine | Admitting: Internal Medicine

## 2023-12-07 DIAGNOSIS — D320 Benign neoplasm of cerebral meninges: Secondary | ICD-10-CM | POA: Diagnosis present

## 2023-12-07 MED ORDER — GADOBUTROL 1 MMOL/ML IV SOLN
10.0000 mL | Freq: Once | INTRAVENOUS | Status: AC | PRN
  Administered 2023-12-07: 10 mL via INTRAVENOUS

## 2023-12-10 ENCOUNTER — Ambulatory Visit: Payer: Medicaid Other | Admitting: Internal Medicine

## 2023-12-10 ENCOUNTER — Inpatient Hospital Stay: Payer: Medicaid Other | Attending: Hematology and Oncology | Admitting: Hematology and Oncology

## 2023-12-10 DIAGNOSIS — Z79899 Other long term (current) drug therapy: Secondary | ICD-10-CM | POA: Insufficient documentation

## 2023-12-10 DIAGNOSIS — D420 Neoplasm of uncertain behavior of cerebral meninges: Secondary | ICD-10-CM | POA: Insufficient documentation

## 2023-12-10 DIAGNOSIS — Z800 Family history of malignant neoplasm of digestive organs: Secondary | ICD-10-CM | POA: Insufficient documentation

## 2023-12-10 DIAGNOSIS — Z803 Family history of malignant neoplasm of breast: Secondary | ICD-10-CM | POA: Insufficient documentation

## 2023-12-10 DIAGNOSIS — Z808 Family history of malignant neoplasm of other organs or systems: Secondary | ICD-10-CM | POA: Insufficient documentation

## 2023-12-11 ENCOUNTER — Telehealth: Payer: Self-pay | Admitting: *Deleted

## 2023-12-11 ENCOUNTER — Inpatient Hospital Stay: Payer: Medicaid Other | Admitting: Internal Medicine

## 2023-12-11 ENCOUNTER — Telehealth: Payer: Self-pay | Admitting: Internal Medicine

## 2023-12-11 NOTE — Telephone Encounter (Signed)
PC to patient regarding missed appointment this morning, no answer, left VM - informed patient this appointment was for Dr Barbaraann Cao to discuss her recent MRI results, our scheduling department will contact her to reschedule.  Also instructed patient to contact this office with any questions/concerns, [phone].  Scheduling message sent.

## 2023-12-11 NOTE — Telephone Encounter (Signed)
Called and reschedule patient for 12/19, patient forgot about appt.

## 2023-12-12 ENCOUNTER — Telehealth: Payer: Self-pay | Admitting: Internal Medicine

## 2023-12-13 ENCOUNTER — Ambulatory Visit: Payer: Medicaid Other | Admitting: Internal Medicine

## 2023-12-17 ENCOUNTER — Inpatient Hospital Stay (HOSPITAL_BASED_OUTPATIENT_CLINIC_OR_DEPARTMENT_OTHER): Payer: Medicaid Other | Admitting: Internal Medicine

## 2023-12-17 VITALS — BP 99/71 | HR 73 | Temp 97.0°F | Resp 17 | Wt 237.9 lb

## 2023-12-17 DIAGNOSIS — Z803 Family history of malignant neoplasm of breast: Secondary | ICD-10-CM | POA: Diagnosis not present

## 2023-12-17 DIAGNOSIS — Z79899 Other long term (current) drug therapy: Secondary | ICD-10-CM | POA: Diagnosis not present

## 2023-12-17 DIAGNOSIS — D420 Neoplasm of uncertain behavior of cerebral meninges: Secondary | ICD-10-CM | POA: Diagnosis not present

## 2023-12-17 DIAGNOSIS — Z808 Family history of malignant neoplasm of other organs or systems: Secondary | ICD-10-CM | POA: Diagnosis not present

## 2023-12-17 DIAGNOSIS — Z800 Family history of malignant neoplasm of digestive organs: Secondary | ICD-10-CM | POA: Diagnosis not present

## 2023-12-17 MED ORDER — TOPIRAMATE 100 MG PO TABS: 100.0000 mg | ORAL_TABLET | Freq: Two times a day (BID) | ORAL | 2 refills | Status: DC

## 2023-12-17 NOTE — Progress Notes (Signed)
[facility] [address]  [place], [place] [address] [phone]   Interval Evaluation  Date of Service: 12/17/23 Patient Name: Evelyn [name] Patient MRN: [id number] Patient DOB: 1971/10/19 Provider: Henreitta Leber, MD  Identifying Statement:  Evelyn [name] is a 52 y.o. 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    Referring Provider: Bryon Lions, PA-C [address] [place],  [place] [address]  Oncologic History: 02/08/23: Craniotomy, resection with Dr. Maurice Small; path is meningioma WHO 2 05/09/23: Completes 6 weeks IMRT with Dr. Basilio Cairo to resection bed  Biomarkers: n/a  Interval History: Evelyn [name] presents today for follow up after recent MRI brain.  She did experience one seizure this past month, characterized by several minutes of staring, disconnection.  Continues to have sporadic headaches.  There has been increased stress surrounding her mother's new cancer diagnosis.  Continues on Keppra, Topamax as prior.   H+P (09/10/23) Patient presents today for follow up after having completed radiation, post-RT MRI study.  She denies any frank seizures in recent weeks.  Daily moderate headaches have been a concern, she has been dosing Tylenol daily for months.  No significant issues with gait, did have a provoked fall earlier this morning.  Continues on Keppra 727m twice per day and Topamax 50mg  twice per day.  Medications: Current Outpatient Medications on File Prior to Visit  Medication Sig Dispense Refill   acetaminophen (TYLENOL) 500 MG tablet Take 1,000 mg by mouth in the morning and at bedtime.     butalbital-acetaminophen-caffeine (FIORICET) 50-325-40 MG tablet Take 2 tablets by mouth every 6 (six) hours as needed for headache (first line for headache).   Not to exceed 6 tablets per day 60 tablet 0   calcium carbonate (TUMS - DOSED IN MG  ELEMENTAL CALCIUM) 500 MG chewable tablet Chew 1 tablet (200 mg of elemental calcium total) by mouth 3 (three) times daily. 30 tablet 0   dextromethorphan-guaiFENesin (MUCINEX DM) 30-600 MG 12hr tablet Take 1 tablet by mouth 2 (two) times daily. 15 tablet 0   famotidine (PEPCID) 20 MG tablet Take 1 tablet (20 mg total) by mouth 2 (two) times daily. 60 tablet 0   levETIRAcetam (KEPPRA) 750 MG tablet Take 1 tablet (750 mg total) by mouth 2 (two) times daily. 180 tablet 3   methocarbamol (ROBAXIN) 500 MG tablet Take 1 tablet (500 mg total) by mouth every 6 (six) hours as needed for muscle spasms. 60 tablet 1   nystatin (MYCOSTATIN/NYSTOP) powder      nystatin cream (MYCOSTATIN) Apply topically 2 (two) times daily.     polyethylene glycol (MIRALAX / GLYCOLAX) 17 g packet Take 17 g by mouth 2 (two) times daily. 14 each 0   topiramate (TOPAMAX) 50 MG tablet Take 1 tablet (50 mg total) by mouth 2 (two) times daily. 180 tablet 3   Vitamin D, Ergocalciferol, (DRISDOL) 1.25 MG (50000 UNIT) CAPS capsule Take 1 capsule (50,000 Units total) by mouth once a week. 5 capsule 0   [DISCONTINUED] omeprazole (PRILOSEC) 20 MG capsule Take 1 capsule (20 mg total) by mouth daily. (Patient not taking: Reported on 01/16/2017) 60 capsule 1   [DISCONTINUED] phentermine 37.5 MG capsule Take 37.5 mg by mouth every morning.     No current facility-administered medications on file prior to visit.    Allergies:  Allergies  Allergen Reactions   Anastrozole Other (See Comments)  Depleted wbc and neutrophils   Cymbalta [Duloxetine Hcl]     hallucinations   Dilaudid [Hydromorphone Hcl] Nausea And Vomiting   Morphine And Codeine Other (See Comments)    Fever and chills and convulsions   Vicodin [Hydrocodone-Acetaminophen] Nausea And Vomiting    Dizziness    Past Medical History:  Past Medical History:  Diagnosis Date   Anemia    Anxiety    Depression    Family history of breast cancer    Family history of colon  cancer    GERD (gastroesophageal reflux disease)    Headache    Obesity    Seizures (HCC)    Sleep apnea    Past Surgical History:  Past Surgical History:  Procedure Laterality Date   ABDOMINAL HYSTERECTOMY     APPLICATION OF CRANIAL NAVIGATION N/A 02/08/2023   Procedure: APPLICATION OF CRANIAL NAVIGATION;  Surgeon: Jadene Pierini, MD;  Location: [facility] OR;  Service: Neurosurgery;  Laterality: N/A;   BREAST LUMPECTOMY Left 1997   CHOLECYSTECTOMY     CRANIOTOMY Left 02/08/2023   Procedure: Left craniotomy for tumor resection with stealth navigation;  Surgeon: Jadene Pierini, MD;  Location: [facility] OR;  Service: Neurosurgery;  Laterality: Left;   GASTRIC BYPASS  03/2017   THROAT SURGERY     removed scar tissue from throat d/t being intubated.    TUBAL LIGATION     Social History:  Social History   Socioeconomic History   Marital status: Divorced    Spouse name: Not on file   Number of children: 3   Years of education: Not on file   Highest education level: Some college, no degree  Occupational History   Not on file  Tobacco Use   Smoking status: Never   Smokeless tobacco: Never  Vaping Use   Vaping status: Never Used  Substance and Sexual Activity   Alcohol use: No   Drug use: No   Sexual activity: Not Currently    Birth control/protection: Surgical  Other Topics Concern   Not on file  Social History Narrative   Not on file   Social Drivers of Health   Financial Resource Strain: Medium Risk (06/20/2023)   Received from [facility], [facility]   Overall Financial Resource Strain (CARDIA)    Difficulty of Paying Living Expenses: Somewhat hard  Food Insecurity: No Food Insecurity (06/20/2023)   Received from [facility], [facility]   Hunger Vital Sign    Worried About Running Out of Food in the Last Year: Never true    Ran Out of Food in the Last Year: Never true  Recent Concern: Food Insecurity - Food Insecurity Present (03/31/2023)   Received from  [facility]   Hunger Vital Sign    Worried About Running Out of Food in the Last Year: Sometimes true    Ran Out of Food in the Last Year: Sometimes true  Transportation Needs: No Transportation Needs (06/20/2023)   Received from [employer], [facility]   PRAPARE - Transportation    Lack of Transportation (Medical): No    Lack of Transportation (Non-Medical): No  Physical Activity: Unknown (06/20/2023)   Received from [facility]   Exercise Vital Sign    Days of Exercise per Week: 0 days    Minutes of Exercise per Session: Not on file  Stress: No Stress Concern Present (06/20/2023)   Received from [facility] of Occupational Health - Occupational Stress Questionnaire  Feeling of Stress : Only a little  Recent Concern: Stress - Stress Concern Present (03/31/2023)   Received from [facility] of Occupational Health - Occupational Stress Questionnaire    Feeling of Stress : To some extent  Social Connections: Moderately Integrated (06/20/2023)   Received from [facility], [facility]   Social Network    How would you rate your social network (family, work, friends)?: Adequate participation with social networks  Intimate Partner Violence: Not At Risk (06/20/2023)   Received from [facility], [facility]   HITS    Over the last 12 months how often did your partner physically hurt you?: Never    Over the last 12 months how often did your partner insult you or talk down to you?: Never    Over the last 12 months how often did your partner threaten you with physical harm?: Never    Over the last 12 months how often did your partner scream or curse at you?: Never   Family History:  Family History  Problem Relation Age of Onset   Breast cancer Mother 83   Prostate cancer Father 40   Colon cancer Maternal Grandmother        d. 57   Prostate cancer Maternal Grandfather    Breast cancer Paternal  Grandmother    Colon cancer Maternal Uncle 67   Cancer Maternal Uncle 60       NOS   Colon cancer Other        MGMs brother   Colon cancer Other        MGMs 2 sisters   Prostate cancer Other        PGFs 2 brothers   Brain cancer Paternal Uncle 15       Meningioma    Review of Systems: Constitutional: Doesn't report fevers, chills or abnormal weight loss Eyes: Doesn't report blurriness of vision Ears, nose, mouth, throat, and face: Doesn't report sore throat Respiratory: Doesn't report cough, dyspnea or wheezes Cardiovascular: Doesn't report palpitation, chest discomfort  Gastrointestinal:  Doesn't report nausea, constipation, diarrhea GU: Doesn't report incontinence Skin: Doesn't report skin rashes Neurological: Per HPI Musculoskeletal: Doesn't report joint pain Behavioral/Psych: Doesn't report anxiety  Physical Exam: Vitals:   12/17/23 1007  BP: 99/71  Pulse: 73  Resp: 17  Temp: (!) 97 F (36.1 C)  SpO2: 100%   KPS: 90. General: Alert, cooperative, pleasant, in no acute distress Head: Normal EENT: No conjunctival injection or scleral icterus.  Lungs: Resp effort normal Cardiac: Regular rate Abdomen: Non-distended abdomen Skin: No rashes cyanosis or petechiae. Extremities: No clubbing or edema  Neurologic Exam: Mental Status: Awake, alert, attentive to examiner. Oriented to self and environment. Language is fluent with intact comprehension.  Cranial Nerves: Visual acuity is grossly normal. Visual fields are full. Extra-ocular movements intact. No ptosis. Face is symmetric Motor: Tone and bulk are normal. Power is full in both arms and legs. Reflexes are symmetric, no pathologic reflexes present.  Sensory: Intact to light touch Gait: Normal.   Labs: I have reviewed the data as listed    Component Value Date/Time   NA 136 06/22/2023 1828   K 3.2 (L) 06/22/2023 1828   CL 103 06/22/2023 1828   CO2 23 06/22/2023 1828   GLUCOSE 96 06/22/2023 1828   BUN 5 (L)  06/22/2023 1828   CREATININE 0.71 06/22/2023 1828   CALCIUM 8.6 (L) 06/22/2023 1828   PROT 6.2 (L) 03/13/2023 0043   ALBUMIN 2.3 (L)  03/13/2023 0043   AST 22 03/13/2023 0043   ALT 16 03/13/2023 0043   ALKPHOS 54 03/13/2023 0043   BILITOT 0.4 03/13/2023 0043   GFRNONAA >60 06/22/2023 1828   GFRAA >60 12/07/2019 2340   Lab Results  Component Value Date   WBC 2.2 (L) 06/22/2023   NEUTROABS 1.5 (L) 06/22/2023   HGB 10.7 (L) 06/22/2023   HCT 33.8 (L) 06/22/2023   MCV 80.1 06/22/2023   PLT 171 06/22/2023    Imaging: [facility] Clinician Interpretation: I have personally reviewed the CNS images as listed.  My interpretation, in the context of the patient's clinical presentation, is treatment effect vs true progression pending official read.  Enhancement at treatment site may be secondary to collapse of surgical cavity (favored) vs residual or recurrent tumor.  MR BRAIN W WO CONTRAST Result Date: 12/07/2023 CLINICAL DATA:  Brain/CNS neoplasm. Assess treatment response. Meningioma. EXAM: MRI HEAD WITHOUT AND WITH CONTRAST TECHNIQUE: Multiplanar, multiecho pulse sequences of the brain and surrounding structures were obtained without and with intravenous contrast. CONTRAST:  10mL GADAVIST GADOBUTROL 1 MMOL/ML IV SOLN COMPARISON:  None Available. 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 Residual tumor in the left anterior cranial fossa is stable to slightly decreased in size measuring 6 x 26 x 21 mm. This compares to previous measurements of 9 x 30 x 21 mm. Residual vasogenic edema the left frontal lobe is stable. No midline shift is present. Ex vacuo dilation of the left frontal horn is compatible with the volume loss. No other significant white matter disease is present. No acute infarct, hemorrhage or other mass lesion is present. Ventricles are of size. No significant extra-axial fluid collection is present. Vascular: Flow is  present in the major intracranial arteries. Skull and upper cervical spine: The craniocervical junction is normal. Upper cervical spine is within normal limits. Marrow signal is unremarkable. Sinuses/Orbits: The paranasal sinuses and mastoid air cells are clear. The globes and orbits are within normal limits. IMPRESSION: 1. Stable to slightly decreased size of residual tumor in the left anterior cranial fossa measuring 6 x 26 x 21 mm. 2. Stable vasogenic edema in the left frontal lobe. 3. No acute intracranial abnormality or significant interval change. Electronically Signed   By: Marin Roberts M.D.   On: 12/07/2023 14:07    Pathology:  Assessment/Plan Atypical intracranial meningioma (HCC)  Evelyn [name] is clinically stable today with regards to [facility] 2 meningioma.  MRI demonstrates stable findings.  Recommended continued close imaging surveillance, with repeat MRI in 6 months.    Should continue Keppra 750mg  BID; will increase Topamax to 100mg  BID for seizure and headache prevention  We ask that Evelyn [name] return to clinic in 3 months for seizure visit, or sooner as needed.  MRI brain in 6 months.  All questions were answered. The patient knows to call the clinic with any problems, questions or concerns. No barriers to learning were detected.  The total time spent in the encounter was 40 minutes and more than 50% was on counseling and review of test results   Henreitta Leber, MD Medical Director of Neuro-Oncology [facility] 12/17/23 10:32 AM

## 2023-12-18 ENCOUNTER — Telehealth: Payer: Self-pay | Admitting: Internal Medicine

## 2024-01-17 ENCOUNTER — Telehealth: Payer: Self-pay | Admitting: Neurology

## 2024-01-17 ENCOUNTER — Ambulatory Visit: Payer: Medicaid Other | Admitting: Neurology

## 2024-01-17 ENCOUNTER — Encounter: Payer: Self-pay | Admitting: Neurology

## 2024-01-17 VITALS — BP 118/75 | HR 77 | Ht 67.0 in | Wt 232.5 lb

## 2024-01-17 DIAGNOSIS — D329 Benign neoplasm of meninges, unspecified: Secondary | ICD-10-CM

## 2024-01-17 DIAGNOSIS — G43709 Chronic migraine without aura, not intractable, without status migrainosus: Secondary | ICD-10-CM | POA: Diagnosis not present

## 2024-01-17 DIAGNOSIS — G40909 Epilepsy, unspecified, not intractable, without status epilepticus: Secondary | ICD-10-CM

## 2024-01-17 DIAGNOSIS — W19XXXD Unspecified fall, subsequent encounter: Secondary | ICD-10-CM

## 2024-01-17 MED ORDER — TOPIRAMATE 100 MG PO TABS: 150.0000 mg | ORAL_TABLET | Freq: Two times a day (BID) | ORAL | 6 refills | Status: DC

## 2024-01-17 MED ORDER — GABAPENTIN 300 MG PO CAPS: 300.0000 mg | ORAL_CAPSULE | Freq: Two times a day (BID) | ORAL | 0 refills | Status: DC

## 2024-01-17 NOTE — Patient Instructions (Addendum)
Increase Topiramate to 150 mg twice daily Continue Keppra 750 mg twice daily Will give a trial of gabapentin and recommend magnesium 400 mg nightly for headaches. Continue your other medications Continue follow-up with your doctors Referral to physical therapy for gait training due to multiple falls Follow-up in 6 months or sooner if worse.

## 2024-01-17 NOTE — Progress Notes (Signed)
[facility]  PATIENT: Evelyn [name] DOB: 12/20/1971  REQUESTING CLINICIAN: Bryon Lions, PA-C HISTORY FROM: Patient  REASON FOR VISIT: Seizure like activity    HISTORICAL  CHIEF COMPLAINT:  Chief Complaint  Patient presents with   Follow-up    Rm13, alone,  Sz :last episode was last month and denied missing meds,  Migraine: daily with varying intensity (triggers: noise, stress, sudden movement), pt reported fall today says left leg is dragging    INTERVAL HISTORY 01/16/2023 Evelyn [name] presents today for follow-up, she is alone. Last visit was in July.  Since then, she tells me that she is doing okay.  She did have follow-up MRI which showed residual tumor measuring 6 x 26 x 21 mm.  She has additional MRI pending in the future.  She reports having a small seizure last month but felt like it was related to high stress.  She is not sure about her seizure frequency  but do report lapses in her memory. She reports high stress due to her mother being in and out of the hospital. She is still complaining of headaches, mainly left-sided.  She tells me that she takes Tylenol daily but has discontinued it in the past 2 weeks.  She is currently on topiramate 100 mg twice daily and levetiracetam 750 mg twice daily. Patient reports multiple falls, she feels like her legs are giving out, fortunately no major injuries.   INTERVAL HISTORY 07/11/2023:  Patient presents today for follow-up, last visit was in January and at that time she was diagnosed with a left frontal meningioma which she did have resection from Dr. Johnsie Cancel.  After resection she did have radiotherapy because it was a grade 2 meningioma.  Since then he has she has headaches and a breakthrough seizure a month ago in the setting of missing her medication for 3 days. Patient reports that she does not feel like her normal self.  Due to the meningioma and the surgery she has lost her job, she does not have any  income and this has been very stressful for her.  She is currently staying with her mother.  She is also not driving.    HISTORY OF PRESENT ILLNESS:  This is a 53 year old woman past medical history of sleep apnea currently not on CPAP machine, arthritis, migraines, obesity who is presenting with complaints of seizure-like activity.  Patient reports for the past 3 years, she has been experiencing episodes of behavioral arrest and losing track of time. These episodes have been more frequent to the point that she is getting 4-6 episodes per month.  Some days she woke up at home and be very confused, it will take her about 20-minutes to know where she is. She reports on 2 occasions, family members have witnessed these events.  The first event was when he was riding with his son.  She works as a Midwife and the son was with her at that time. She reports that she came to her son screaming at her and asking her why she is making constant turn while driving.  The second event was when she was talking with a family member on the phone, she reports she was still driving and come to the family member screaming at her on the phone, calling her name.  She denies any jerk or jerk like activity, denies any injury, denies any tongue biting, urinary incontinence. She reports her last episode was on 12/29/22  Patient also reports a history  of migraines since the age of 89, migraines went away after high school but lately have returned with nausea,. She reports for the past 3 months, she has daily headaches, bitemporal then migrate to frontal headaches with nausea. Headaches last all day. At time she will take OTC tylenol but she is not on any preventive medication.    Handedness: Right handed   Onset: 3 year ago, getting worse   Seizure Type: Staring, losing track of time  Current frequency: 4 to 6 episodes per month   Any injuries from seizures: Denies   Seizure risk factors: None   Previous ASMs: None    Currenty ASMs: Levetiracetam 750 mg BID and Topiramate 50 mg twice daily   ASMs side effects: N/A   Brain Images: None   Previous EEGs: None    OTHER MEDICAL CONDITIONS: Migraines, Arthritis, Sleep apnea   REVIEW OF SYSTEMS: Full 14 system review of systems performed and negative with exception of: As noted in the HPI   ALLERGIES: Allergies  Allergen Reactions   Anastrozole Other (See Comments)    Depleted wbc and neutrophils   Cymbalta [Duloxetine Hcl]     hallucinations   Dilaudid [Hydromorphone Hcl] Nausea And Vomiting   Morphine And Codeine Other (See Comments)    Fever and chills and convulsions   Vicodin [Hydrocodone-Acetaminophen] Nausea And Vomiting    Dizziness     HOME MEDICATIONS: Outpatient Medications Prior to Visit  Medication Sig Dispense Refill   acetaminophen (TYLENOL) 500 MG tablet Take 1,000 mg by mouth in the morning and at bedtime.     butalbital-acetaminophen-caffeine (FIORICET) 50-325-40 MG tablet Take 2 tablets by mouth every 6 (six) hours as needed for headache (first line for headache).   Not to exceed 6 tablets per day 60 tablet 0   calcium carbonate (TUMS - DOSED IN MG ELEMENTAL CALCIUM) 500 MG chewable tablet Chew 1 tablet (200 mg of elemental calcium total) by mouth 3 (three) times daily. 30 tablet 0   dextromethorphan-guaiFENesin (MUCINEX DM) 30-600 MG 12hr tablet Take 1 tablet by mouth 2 (two) times daily. 15 tablet 0   famotidine (PEPCID) 20 MG tablet Take 1 tablet (20 mg total) by mouth 2 (two) times daily. 60 tablet 0   levETIRAcetam (KEPPRA) 750 MG tablet Take 1 tablet (750 mg total) by mouth 2 (two) times daily. 180 tablet 3   methocarbamol (ROBAXIN) 500 MG tablet Take 1 tablet (500 mg total) by mouth every 6 (six) hours as needed for muscle spasms. 60 tablet 1   nystatin (MYCOSTATIN/NYSTOP) powder      nystatin cream (MYCOSTATIN) Apply topically 2 (two) times daily.     polyethylene glycol (MIRALAX / GLYCOLAX) 17 g packet Take 17 g by  mouth 2 (two) times daily. 14 each 0   Vitamin D, Ergocalciferol, (DRISDOL) 1.25 MG (50000 UNIT) CAPS capsule Take 1 capsule (50,000 Units total) by mouth once a week. 5 capsule 0   topiramate (TOPAMAX) 100 MG tablet Take 1 tablet (100 mg total) by mouth 2 (two) times daily. 60 tablet 2   No facility-administered medications prior to visit.    PAST MEDICAL HISTORY: Past Medical History:  Diagnosis Date   Anemia    Anxiety    Depression    Family history of breast cancer    Family history of colon cancer    GERD (gastroesophageal reflux disease)    Headache    Obesity    Seizures (HCC)    Sleep apnea  PAST SURGICAL HISTORY: Past Surgical History:  Procedure Laterality Date   ABDOMINAL HYSTERECTOMY     APPLICATION OF CRANIAL NAVIGATION N/A 02/08/2023   Procedure: APPLICATION OF CRANIAL NAVIGATION;  Surgeon: Jadene Pierini, MD;  Location: [facility] OR;  Service: Neurosurgery;  Laterality: N/A;   BREAST LUMPECTOMY Left 1997   CHOLECYSTECTOMY     CRANIOTOMY Left 02/08/2023   Procedure: Left craniotomy for tumor resection with stealth navigation;  Surgeon: Jadene Pierini, MD;  Location: [facility] OR;  Service: Neurosurgery;  Laterality: Left;   GASTRIC BYPASS  03/2017   THROAT SURGERY     removed scar tissue from throat d/t being intubated.    TUBAL LIGATION      FAMILY HISTORY: Family History  Problem Relation Age of Onset   Breast cancer Mother 51   Prostate cancer Father 79   Colon cancer Maternal Grandmother        d. 53   Prostate cancer Maternal Grandfather    Breast cancer Paternal Grandmother    Colon cancer Maternal Uncle 9   Cancer Maternal Uncle 60       NOS   Colon cancer Other        MGMs brother   Colon cancer Other        MGMs 2 sisters   Prostate cancer Other        PGFs 2 brothers   Brain cancer Paternal Uncle 15       Meningioma    SOCIAL HISTORY: Social History   Socioeconomic History   Marital status: Divorced    Spouse name: Not on file    Number of children: 3   Years of education: Not on file   Highest education level: Some college, no degree  Occupational History   Not on file  Tobacco Use   Smoking status: Never   Smokeless tobacco: Never  Vaping Use   Vaping status: Never Used  Substance and Sexual Activity   Alcohol use: No   Drug use: No   Sexual activity: Not Currently    Birth control/protection: Surgical  Other Topics Concern   Not on file  Social History Narrative   Not on file   Social Drivers of Health   Financial Resource Strain: Low Risk  (01/15/2024)   Received from [facility]   Overall Financial Resource Strain (CARDIA)    Difficulty of Paying Living Expenses: Not hard at all  Food Insecurity: No Food Insecurity (01/15/2024)   Received from [facility]   Hunger Vital Sign    Worried About Running Out of Food in the Last Year: Never true    Ran Out of Food in the Last Year: Never true  Transportation Needs: No Transportation Needs (01/15/2024)   Received from [facility] - Transportation    Lack of Transportation (Medical): No    Lack of Transportation (Non-Medical): No  Physical Activity: Unknown (06/20/2023)   Received from [facility], [facility]   Exercise Vital Sign    Days of Exercise per Week: 0 days    Minutes of Exercise per Session: Not on file  Stress: No Stress Concern Present (06/20/2023)   Received from [facility] Occupational [facility] - Occupational Stress Questionnaire    Feeling of Stress : Only a little  Recent Concern: Stress - Stress Concern Present (03/31/2023)   Received from [facility] of Occupational Health - Occupational Stress Questionnaire    Feeling of  Stress : To some extent  Social Connections: Moderately Integrated (06/20/2023)   Received from [facility], [facility]   Social Network    How would you rate your social network (family, work, friends)?: Adequate  participation with social networks  Intimate Partner Violence: Not At Risk (06/20/2023)   Received from [facility], [facility]   HITS    Over the last 12 months how often did your partner physically hurt you?: Never    Over the last 12 months how often did your partner insult you or talk down to you?: Never    Over the last 12 months how often did your partner threaten you with physical harm?: Never    Over the last 12 months how often did your partner scream or curse at you?: Never    PHYSICAL EXAM  GENERAL EXAM/CONSTITUTIONAL: Vitals:  Vitals:   01/17/24 1434  BP: 118/75  Pulse: 77  Weight: 232 lb 8 oz (105.5 kg)  Height: 5\' 7"  (1.702 m)   Body mass index is 36.41 kg/m. Wt Readings from Last 3 Encounters:  01/17/24 232 lb 8 oz (105.5 kg)  12/17/23 237 lb 14.4 oz (107.9 kg)  09/10/23 238 lb 8 oz (108.2 kg)   Patient is in no distress; well developed, nourished and groomed; neck is supple  MUSCULOSKELETAL: Gait, strength, tone, movements noted in Neurologic exam below  NEUROLOGIC: MENTAL STATUS:      No data to display         awake, alert, oriented to person, place and time recent and remote memory intact normal attention and concentration language fluent, comprehension intact, naming intact fund of knowledge appropriate  CRANIAL NERVE:  2nd, 3rd, 4th, 6th - Visual fields full to confrontation, extraocular muscles intact, no nystagmus 5th - facial sensation symmetric 7th - facial strength symmetric 8th - hearing intact 9th - palate elevates symmetrically, uvula midline 11th - shoulder shrug symmetric 12th - tongue protrusion midline  MOTOR:  normal bulk and tone, full strength in the BUE, BLE  SENSORY:  normal and symmetric to light touch  COORDINATION:  finger-nose-finger, fine finger movements norma  GAIT/STATION:  normal   DIAGNOSTIC DATA (LABS, IMAGING, TESTING) - I reviewed patient records, labs, notes, testing and imaging myself where  available.  Lab Results  Component Value Date   WBC 2.2 (L) 06/22/2023   HGB 10.7 (L) 06/22/2023   HCT 33.8 (L) 06/22/2023   MCV 80.1 06/22/2023   PLT 171 06/22/2023      Component Value Date/Time   NA 136 06/22/2023 1828   K 3.2 (L) 06/22/2023 1828   CL 103 06/22/2023 1828   CO2 23 06/22/2023 1828   GLUCOSE 96 06/22/2023 1828   BUN 5 (L) 06/22/2023 1828   CREATININE 0.71 06/22/2023 1828   CALCIUM 8.6 (L) 06/22/2023 1828   PROT 6.2 (L) 03/13/2023 0043   ALBUMIN 2.3 (L) 03/13/2023 0043   AST 22 03/13/2023 0043   ALT 16 03/13/2023 0043   ALKPHOS 54 03/13/2023 0043   BILITOT 0.4 03/13/2023 0043   GFRNONAA >60 06/22/2023 1828   GFRAA >60 12/07/2019 2340   No results found for: "CHOL", "HDL", "LDLCALC", "LDLDIRECT", "TRIG" No results found for: "HGBA1C" Lab Results  Component Value Date   VITAMINB12 458 01/01/2011   Lab Results  Component Value Date   TSH 1.684 03/29/2023   Routine EEG 01/18/2023: Normal   Routine EEG 03/12/2023: Normal   MRI Brain 01/17/2023 MRI scan of the brain with and without contrast showing  a large left orbital frontal meningeal base mass with intense enhancement with mild cytotoxic edema and left-to-right brain herniation 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 No purulence suggested by diffusion imaging, sterility is indeterminate in the setting of fever. 2. Small volume residual tumor along the fovea ethmoidalis and olfactory recess.  MRI Brain 12/07/2023 1. Stable to slightly decreased size of residual tumor in the left anterior cranial fossa measuring 6 x 26 x 21 mm. 2. Stable vasogenic edema in the left frontal lobe. 3. No acute intracranial abnormality or significant interval change.  ASSESSMENT AND PLAN  53 y.o. year old female  with sleep apnea currently not on CPAP, arthritis,  migraines, obesity who is presenting for follow-up for her seizures and meningioma.  Her meningioma was resected, she did have radiotherapy following resection but repeat MRI showed residual tumor in the left anterior cranial fossa. She has pending surveillance MRI.  She reports one seizure in high stress last month and continued headaches.  Will increase her topiramate to 150 mg twice daily, give her a trial of gabapentin 300 mg twice daily and magnesium 400 mg nightly.  Advised patient to contact me in a month for update.  Due to her gait abnormality and multiple falls, we will also send her for physical therapy.  I will see her in 6 months or sooner if worse.   1. Seizure disorder (HCC)   2. Meningioma (HCC)   3. Chronic migraine without aura without status migrainosus, not intractable   4. Fall, subsequent encounter       Patient Instructions  Increase Topiramate to 150 mg twice daily Continue Keppra 750 mg twice daily Will give a trial of gabapentin and recommend magnesium 400 mg nightly for headaches. Continue your other medications Continue follow-up with your doctors Referral to physical therapy for gait training due to multiple falls Follow-up in 6 months or sooner if worse.   Per [facility] statutes, patients with seizures are not allowed to drive until they have been seizure-free for six months.  Other recommendations include using caution when using heavy equipment or power tools. Avoid working on ladders or at heights. Take showers instead of baths.  Do not swim alone.  Ensure the water temperature is not too high on the home water heater. Do not go swimming alone. Do not lock yourself in a room alone (i.e. bathroom). When caring for infants or small children, sit down when holding, feeding, or changing them to minimize risk of injury to the child in the event you have a seizure. Maintain good sleep hygiene. Avoid alcohol.  Also recommend adequate sleep, hydration, good  diet and minimize stress.   During the Seizure  - First, ensure adequate ventilation and place patients on the floor on their left side  Loosen clothing around the neck and ensure the airway is patent. If the patient is clenching the teeth, do not force the mouth open with any object as this can cause severe damage - Remove all items from the surrounding that can be hazardous. The patient may be oblivious to what's happening and may not even know what he or she is doing. If the patient is confused and wandering, either gently guide him/her away and block access to outside areas - Reassure the individual and be comforting - Call 911. In most cases, the seizure ends before EMS arrives. However, there  are cases when seizures may last over 3 to 5 minutes. Or the individual may have developed breathing difficulties or severe injuries. If a pregnant patient or a person with diabetes develops a seizure, it is prudent to call an ambulance. - Finally, if the patient does not regain full consciousness, then call EMS. Most patients will remain confused for about 45 to 90 minutes after a seizure, so you must use judgment in calling for help. - Avoid restraints but make sure the patient is in a bed with padded side rails - Place the individual in a lateral position with the neck slightly flexed; this will help the saliva drain from the mouth and prevent the tongue from falling backward - Remove all nearby furniture and other hazards from the area - Provide verbal assurance as the individual is regaining consciousness - Provide the patient with privacy if possible - Call for help and start treatment as ordered by the caregiver   After the Seizure (Postictal Stage)  After a seizure, most patients experience confusion, fatigue, muscle pain and/or a headache. Thus, one should permit the individual to sleep. For the next few days, reassurance is essential. Being calm and helping reorient the person is also of  importance.  Most seizures are painless and end spontaneously. Seizures are not harmful to others but can lead to complications such as stress on the lungs, brain and the heart. Individuals with prior lung problems may develop labored breathing and respiratory distress.     Orders Placed This Encounter  Procedures   Ambulatory referral to Physical Therapy    Meds ordered this encounter  Medications   topiramate (TOPAMAX) 100 MG tablet    Sig: Take 1.5 tablets (150 mg total) by mouth 2 (two) times daily.    Dispense:  90 tablet    Refill:  6   gabapentin (NEURONTIN) 300 MG capsule    Sig: Take 1 capsule (300 mg total) by mouth 2 (two) times daily.    Dispense:  60 capsule    Refill:  0    Return in about 6 months (around 07/16/2024).    Windell Norfolk, MD 01/17/2024, 4:25 PM  [facility] [address], [address] [place], [place] [address] [phone]

## 2024-01-17 NOTE — Telephone Encounter (Signed)
Referral for physical therapy sent through [facility] to [facility]. Phone: [phone].

## 2024-02-11 ENCOUNTER — Other Ambulatory Visit: Payer: Self-pay | Admitting: Neurology

## 2024-02-13 NOTE — Telephone Encounter (Signed)
Dr. Teresa Coombs- I see in last note you were giving a trial of medication. Were you going to continue refilling? Pt asking for refill

## 2024-03-15 ENCOUNTER — Other Ambulatory Visit: Payer: Self-pay | Admitting: Internal Medicine

## 2024-03-17 ENCOUNTER — Inpatient Hospital Stay: Payer: Medicaid Other | Attending: Hematology and Oncology | Admitting: Internal Medicine

## 2024-03-28 ENCOUNTER — Ambulatory Visit (HOSPITAL_COMMUNITY): Admission: RE | Admit: 2024-03-28 | Source: Ambulatory Visit

## 2024-04-21 ENCOUNTER — Other Ambulatory Visit: Payer: Self-pay | Admitting: Internal Medicine

## 2024-05-17 ENCOUNTER — Other Ambulatory Visit: Payer: Self-pay | Admitting: Internal Medicine

## 2024-07-01 ENCOUNTER — Other Ambulatory Visit: Payer: Self-pay | Admitting: Internal Medicine

## 2024-07-01 ENCOUNTER — Other Ambulatory Visit: Payer: Self-pay | Admitting: *Deleted

## 2024-07-01 DIAGNOSIS — D320 Benign neoplasm of cerebral meninges: Secondary | ICD-10-CM

## 2024-07-01 MED ORDER — LEVETIRACETAM 750 MG PO TABS
750.0000 mg | ORAL_TABLET | Freq: Two times a day (BID) | ORAL | 3 refills | Status: DC
Start: 2024-07-01 — End: 2024-07-28

## 2024-07-25 ENCOUNTER — Other Ambulatory Visit: Payer: Self-pay | Admitting: Internal Medicine

## 2024-07-25 NOTE — Telephone Encounter (Signed)
 Missed her March 2025 visit as a no show. Sending to Dr. Buckley for guidance. Andrea CHRISTELLA Plunk, RN

## 2024-07-28 ENCOUNTER — Encounter: Payer: Self-pay | Admitting: Neurology

## 2024-07-28 ENCOUNTER — Ambulatory Visit (INDEPENDENT_AMBULATORY_CARE_PROVIDER_SITE_OTHER): Payer: Medicaid Other | Admitting: Neurology

## 2024-07-28 VITALS — BP 114/79 | HR 64 | Ht 67.0 in | Wt 243.5 lb

## 2024-07-28 DIAGNOSIS — G40109 Localization-related (focal) (partial) symptomatic epilepsy and epileptic syndromes with simple partial seizures, not intractable, without status epilepticus: Secondary | ICD-10-CM | POA: Diagnosis not present

## 2024-07-28 DIAGNOSIS — G43709 Chronic migraine without aura, not intractable, without status migrainosus: Secondary | ICD-10-CM | POA: Diagnosis not present

## 2024-07-28 DIAGNOSIS — D320 Benign neoplasm of cerebral meninges: Secondary | ICD-10-CM

## 2024-07-28 MED ORDER — LEVETIRACETAM 1000 MG PO TABS
1000.0000 mg | ORAL_TABLET | Freq: Two times a day (BID) | ORAL | 3 refills | Status: DC
Start: 2024-07-28 — End: 2025-07-23

## 2024-07-28 MED ORDER — TOPIRAMATE 200 MG PO TABS: 200.0000 mg | ORAL_TABLET | Freq: Two times a day (BID) | ORAL | 3 refills | Status: DC

## 2024-07-28 NOTE — Patient Instructions (Signed)
 Increase Levetiracetam  to 1000 mg twice daily  Increase Topiramate  to 200 mg twice daily  Continue your other medications  Will obtain updated Brain MRI  Please call us  for any additional seizures  Return in 6 months or sooner if worse

## 2024-07-28 NOTE — Progress Notes (Signed)
 [facility]  PATIENT: Evelyn [name] DOB: 1971-03-04  REQUESTING CLINICIAN: Valma [name] LABOR, PA-C HISTORY FROM: Patient  REASON FOR VISIT: Seizure like activity    HISTORICAL  CHIEF COMPLAINT:  Chief Complaint  Patient presents with   Seizures    Rm13, godson present, Sz: last sz last week and pt denied missing medication   Migraine    Rm13, godson present, Migraines: pt reported 3/30 days w/migraine. Triggers: screentime, sound, stress     INTERVAL HISTORY 07/28/2024 Patient presents today for follow-up, she is accompanied by her godson.  Last visit was in January, since then, she tells me that she has a few seizure, the last 1 was a month ago.  Her seizures are described as focal unaware, where she stares and does not respond.  She has not had any generalized convulsion, denies any injuries She tells me that currently her stress level is very high, she was denied disability, has no income but she is planning to appeal it. She still reports occasional headaches.  She tells me that she continues to falls.  At last visit, we did refer her to physical therapy but due to cost she was not able to attend.  She is working with her PCP to have physical therapy at the lower cost within [facility]   INTERVAL HISTORY 01/17/2024 Evelyn [name] presents today for follow-up, she is alone. Last visit was in July.  Since then, she tells me that she is doing okay.  She did have follow-up MRI which showed residual tumor measuring 6 x 26 x 21 mm.  She has additional MRI pending in the future.  She reports having a small seizure last month but felt like it was related to high stress.  She is not sure about her seizure frequency  but do report lapses in her memory. She reports high stress due to her mother being in and out of the hospital. She is still complaining of headaches, mainly left-sided.  She tells me that she takes Tylenol  daily but has discontinued it in the past 2 weeks.   She is currently on topiramate  100 mg twice daily and levetiracetam  750 mg twice daily. Patient reports multiple falls, she feels like her legs are giving out, fortunately no major injuries.   INTERVAL HISTORY 07/11/2023:  Patient presents today for follow-up, last visit was in January and at that time she was diagnosed with a left frontal meningioma which she did have resection from Dr. Rockney.  After resection she did have radiotherapy because it was a grade 2 meningioma.  Since then he has she has headaches and a breakthrough seizure a month ago in the setting of missing her medication for 3 days. Patient reports that she does not feel like her normal self.  Due to the meningioma and the surgery she has lost her job, she does not have any income and this has been very stressful for her.  She is currently staying with her mother.  She is also not driving.    HISTORY OF PRESENT ILLNESS:  This is a 53 year old woman past medical history of sleep apnea currently not on CPAP machine, arthritis, migraines, obesity who is presenting with complaints of seizure-like activity.  Patient reports for the past 3 years, she has been experiencing episodes of behavioral arrest and losing track of time. These episodes have been more frequent to the point that she is getting 4-6 episodes per month.  Some days she woke up at home and  be very confused, it will take her about 20-minutes to know where she is. She reports on 2 occasions, family members have witnessed these events.  The first event was when he was riding with his son.  She works as a Midwife and the son was with her at that time. She reports that she came to her son screaming at her and asking her why she is making constant turn while driving.  The second event was when she was talking with a family member on the phone, she reports she was still driving and come to the family member screaming at her on the phone, calling her name.  She denies any  jerk or jerk like activity, denies any injury, denies any tongue biting, urinary incontinence. She reports her last episode was on 12/29/22  Patient also reports a history of migraines since the age of 44, migraines went away after high school but lately have returned with nausea,. She reports for the past 3 months, she has daily headaches, bitemporal then migrate to frontal headaches with nausea. Headaches last all day. At time she will take OTC tylenol  but she is not on any preventive medication.    Handedness: Right handed   Onset: 3 year ago, getting worse   Seizure Type: Staring, losing track of time  Current frequency: 3 seizures in the last 6 months, last one was a month ago   Any injuries from seizures: Denies   Seizure risk factors: None   Previous ASMs: None   Currenty ASMs: Levetiracetam  750 mg BID and Topiramate  150 mg twice daily   ASMs side effects: N/A   Brain Images: None   Previous EEGs: None    OTHER MEDICAL CONDITIONS: Migraines, Arthritis, Sleep apnea   REVIEW OF SYSTEMS: Full 14 system review of systems performed and negative with exception of: As noted in the HPI   ALLERGIES: Allergies  Allergen Reactions   Anastrozole  Other (See Comments)    Depleted wbc and neutrophils   Cymbalta  [Duloxetine  Hcl]     hallucinations   Dilaudid  [Hydromorphone  Hcl] Nausea And Vomiting   Morphine And Codeine  Other (See Comments)    Fever and chills and convulsions   Vicodin [Hydrocodone -Acetaminophen ] Nausea And Vomiting    Dizziness     HOME MEDICATIONS: Outpatient Medications Prior to Visit  Medication Sig Dispense Refill   acetaminophen  (TYLENOL ) 500 MG tablet Take 1,000 mg by mouth in the morning and at bedtime.     butalbital -acetaminophen -caffeine  (FIORICET ) 50-325-40 MG tablet Take 2 tablets by mouth every 6 (six) hours as needed for headache (first line for headache).   Not to exceed 6 tablets per day 60 tablet 0   calcium  carbonate (TUMS - DOSED IN MG  ELEMENTAL CALCIUM ) 500 MG chewable tablet Chew 1 tablet (200 mg of elemental calcium  total) by mouth 3 (three) times daily. 30 tablet 0   dextromethorphan -guaiFENesin  (MUCINEX  DM) 30-600 MG 12hr tablet Take 1 tablet by mouth 2 (two) times daily. 15 tablet 0   famotidine  (PEPCID ) 20 MG tablet Take 1 tablet (20 mg total) by mouth 2 (two) times daily. 60 tablet 0   gabapentin  (NEURONTIN ) 300 MG capsule Take 1 capsule (300 mg total) by mouth 2 (two) times daily. 180 capsule 1   methocarbamol  (ROBAXIN ) 500 MG tablet Take 1 tablet (500 mg total) by mouth every 6 (six) hours as needed for muscle spasms. 60 tablet 1   polyethylene glycol (MIRALAX  / GLYCOLAX ) 17 g packet Take 17 g by mouth 2 (  two) times daily. 14 each 0   Vitamin D , Ergocalciferol , (DRISDOL ) 1.25 MG (50000 UNIT) CAPS capsule Take 1 capsule (50,000 Units total) by mouth once a week. 5 capsule 0   levETIRAcetam  (KEPPRA ) 750 MG tablet Take 1 tablet (750 mg total) by mouth 2 (two) times daily. 180 tablet 3   topiramate  (TOPAMAX ) 100 MG tablet Take 1 tablet by mouth twice daily 60 tablet 0   No facility-administered medications prior to visit.    PAST MEDICAL HISTORY: Past Medical History:  Diagnosis Date   Anemia    Anxiety    Depression    Family history of breast cancer    Family history of colon cancer    GERD (gastroesophageal reflux disease)    Headache    Obesity    Seizures (HCC)    Sleep apnea     PAST SURGICAL HISTORY: Past Surgical History:  Procedure Laterality Date   ABDOMINAL HYSTERECTOMY     APPLICATION OF CRANIAL NAVIGATION N/A 02/08/2023   Procedure: APPLICATION OF CRANIAL NAVIGATION;  Surgeon: Cheryle Debby LABOR, MD;  Location: [facility] OR;  Service: Neurosurgery;  Laterality: N/A;   BREAST LUMPECTOMY Left 1997   CHOLECYSTECTOMY     CRANIOTOMY Left 02/08/2023   Procedure: Left craniotomy for tumor resection with stealth navigation;  Surgeon: Cheryle Debby LABOR, MD;  Location: [facility] OR;  Service: Neurosurgery;   Laterality: Left;   GASTRIC BYPASS  03/2017   THROAT SURGERY     removed scar tissue from throat d/t being intubated.    TUBAL LIGATION      FAMILY HISTORY: Family History  Problem Relation Age of Onset   Breast cancer Mother 106   Prostate cancer Father 24   Colon cancer Maternal Grandmother        d. 87   Prostate cancer Maternal Grandfather    Breast cancer Paternal Grandmother    Colon cancer Maternal Uncle 52   Cancer Maternal Uncle 60       NOS   Colon cancer Other        MGMs brother   Colon cancer Other        MGMs 2 sisters   Prostate cancer Other        PGFs 2 brothers   Brain cancer Paternal Uncle 15       Meningioma    SOCIAL HISTORY: Social History   Socioeconomic History   Marital status: Divorced    Spouse name: Not on file   Number of children: 3   Years of education: Not on file   Highest education level: Some college, no degree  Occupational History   Not on file  Tobacco Use   Smoking status: Never   Smokeless tobacco: Never  Vaping Use   Vaping status: Never Used  Substance and Sexual Activity   Alcohol use: No   Drug use: No   Sexual activity: Not Currently    Birth control/protection: Surgical  Other Topics Concern   Not on file  Social History Narrative   Not on file   Social Drivers of Health   Financial Resource Strain: Low Risk  (01/15/2024)   Received from [facility]   Overall Financial Resource Strain (CARDIA)    Difficulty of Paying Living Expenses: Not hard at all  Food Insecurity: No Food Insecurity (01/15/2024)   Received from [facility]   Hunger Vital Sign    Within the past 12 months, you worried that your food would run out before you got the money  to buy more.: Never true    Within the past 12 months, the food you bought just didn't last and you didn't have money to get more.: Never true  Transportation Needs: No Transportation Needs (01/15/2024)   Received from [facility]   PRAPARE - Transportation    Lack  of Transportation (Medical): No    Lack of Transportation (Non-Medical): No  Physical Activity: Unknown (06/20/2023)   Received from [facility]   Exercise Vital Sign    On average, how many days per week do you engage in moderate to strenuous exercise (like a brisk walk)?: 0 days    Minutes of Exercise per Session: Not on file  Stress: No Stress Concern Present (06/20/2023)   Received from [facility] - Occupational Stress Questionnaire    Feeling of Stress : Only a little  Recent Concern: Stress - Stress Concern Present (03/31/2023)   Received from [facility] - Occupational Stress Questionnaire    Feeling of Stress : To some extent  Social Connections: Moderately Integrated (06/20/2023)   Received from [facility]   Social Network    How would you rate your social network (family, work, friends)?: Adequate participation with social networks  Intimate Partner Violence: Not At Risk (06/20/2023)   Received from [facility]   HITS    Over the last 12 months how often did your partner physically hurt you?: Never    Over the last 12 months how often did your partner insult you or talk down to you?: Never    Over the last 12 months how often did your partner threaten you with physical harm?: Never    Over the last 12 months how often did your partner scream or curse at you?: Never    PHYSICAL EXAM  GENERAL EXAM/CONSTITUTIONAL: Vitals:  Vitals:   07/28/24 1014  BP: 114/79  Pulse: 64  Weight: 110.5 kg  Height: 5' 7 (1.702 m)   Body mass index is 38.14 kg/m. Wt Readings from Last 3 Encounters:  07/28/24 110.5 kg  01/17/24 105.5 kg  12/17/23 107.9 kg   Patient is in no distress; well developed, nourished and groomed; neck is supple  MUSCULOSKELETAL: Gait, strength, tone, movements noted in Neurologic exam below  NEUROLOGIC: MENTAL STATUS:      No data to display         awake,  alert, oriented to person, place and time recent and remote memory intact normal attention and concentration language fluent, comprehension intact, naming intact fund of knowledge appropriate  CRANIAL NERVE:  2nd, 3rd, 4th, 6th - Visual fields full to confrontation, extraocular muscles intact, no nystagmus 5th - facial sensation symmetric 7th - facial strength symmetric 8th - hearing intact 9th - palate elevates symmetrically, uvula midline 11th - shoulder shrug symmetric 12th - tongue protrusion midline  MOTOR:  normal bulk and tone, full strength in the BUE, BLE full on confrontation   SENSORY:  normal and symmetric to light touch  COORDINATION:  finger-nose-finger, fine finger movements norma  GAIT/STATION:  normal   DIAGNOSTIC DATA (LABS, IMAGING, TESTING) - I reviewed patient records, labs, notes, testing and imaging myself where available.  Lab Results  Component Value Date   WBC 2.2 (L) 06/22/2023   HGB 10.7 (L) 06/22/2023   HCT 33.8 (L) 06/22/2023   MCV 80.1 06/22/2023   PLT 171 06/22/2023      Component Value Date/Time   NA 136  06/22/2023 1828   K 3.2 (L) 06/22/2023 1828   CL 103 06/22/2023 1828   CO2 23 06/22/2023 1828   GLUCOSE 96 06/22/2023 1828   BUN 5 (L) 06/22/2023 1828   CREATININE 0.71 06/22/2023 1828   CALCIUM  8.6 (L) 06/22/2023 1828   PROT 6.2 (L) 03/13/2023 0043   ALBUMIN 2.3 (L) 03/13/2023 0043   AST 22 03/13/2023 0043   ALT 16 03/13/2023 0043   ALKPHOS 54 03/13/2023 0043   BILITOT 0.4 03/13/2023 0043   GFRNONAA >60 06/22/2023 1828   GFRAA >60 12/07/2019 2340   No results found for: CHOL, HDL, LDLCALC, LDLDIRECT, TRIG No results found for: HGBA1C Lab Results  Component Value Date   VITAMINB12 458 01/01/2011   Lab Results  Component Value Date   TSH 1.684 03/29/2023   Routine EEG 01/18/2023: Normal   Routine EEG 03/12/2023: Normal   MRI Brain 01/17/2023 MRI scan of the brain with and without contrast showing a large  left orbital frontal meningeal base mass with intense enhancement with mild cytotoxic edema and left-to-right brain herniation 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 No purulence suggested by diffusion imaging, sterility is indeterminate in the setting of fever. 2. Small volume residual tumor along the fovea ethmoidalis and olfactory recess.  MRI Brain 12/07/2023 1. Stable to slightly decreased size of residual tumor in the left anterior cranial fossa measuring 6 x 26 x 21 mm. 2. Stable vasogenic edema in the left frontal lobe. 3. No acute intracranial abnormality or significant interval change.  ASSESSMENT AND PLAN  53 y.o. year old female  with sleep apnea currently not on CPAP, arthritis, migraines, obesity who is presenting for follow-up for her seizures and meningioma.  Her meningioma was resected, she did have radiotherapy following resection but repeat MRI showed residual tumor in the left anterior cranial fossa. Will order a repeat MRI Brain.   She continues to have breakthrough seizure despite compliance with medications. Will increase Keppra  to 1000 mg twice daily and topiramate  200 mg twice daily since she still having headaches.  Will continue her other medications.  Advised her to contact me if she does have any side effect from the medication or any breakthrough seizure.  Strongly advised her to proceed with physical therapy as soon as possible.  I will contact her after the completion of the MRI, advised her to follow-up with Dr. Buckley as scheduled.  I will see her in 6 months for follow-up or sooner if worse.     1. Partial symptomatic epilepsy with simple partial seizures, not intractable, without status epilepticus (HCC)   2. Meningioma, cerebral (HCC)   3. Chronic migraine without aura without status migrainosus, not  intractable      Patient Instructions  Increase Levetiracetam  to 1000 mg twice daily  Increase Topiramate  to 200 mg twice daily  Continue your other medications  Will obtain updated Brain MRI  Please call us  for any additional seizures  Return in 6 months or sooner if worse    Per [place] statutes, patients with seizures are not allowed to drive until they have been seizure-free for six months.  Other recommendations include using caution when using heavy equipment or power tools. Avoid working on ladders or at heights. Take showers instead of baths.  Do not swim alone.  Ensure the water temperature is not too high on the home water heater.  Do not go swimming alone. Do not lock yourself in a room alone (i.e. bathroom). When caring for infants or small children, sit down when holding, feeding, or changing them to minimize risk of injury to the child in the event you have a seizure. Maintain good sleep hygiene. Avoid alcohol.  Also recommend adequate sleep, hydration, good diet and minimize stress.   During the Seizure  - First, ensure adequate ventilation and place patients on the floor on their left side  Loosen clothing around the neck and ensure the airway is patent. If the patient is clenching the teeth, do not force the mouth open with any object as this can cause severe damage - Remove all items from the surrounding that can be hazardous. The patient may be oblivious to what's happening and may not even know what he or she is doing. If the patient is confused and wandering, either gently guide him/her away and block access to outside areas - Reassure the individual and be comforting - Call 911. In most cases, the seizure ends before EMS arrives. However, there are cases when seizures may last over 3 to 5 minutes. Or the individual may have developed breathing difficulties or severe injuries. If a pregnant patient or a person with diabetes develops a seizure, it is prudent to  call an ambulance. - Finally, if the patient does not regain full consciousness, then call EMS. Most patients will remain confused for about 45 to 90 minutes after a seizure, so you must use judgment in calling for help. - Avoid restraints but make sure the patient is in a bed with padded side rails - Place the individual in a lateral position with the neck slightly flexed; this will help the saliva drain from the mouth and prevent the tongue from falling backward - Remove all nearby furniture and other hazards from the area - Provide verbal assurance as the individual is regaining consciousness - Provide the patient with privacy if possible - Call for help and start treatment as ordered by the caregiver   After the Seizure (Postictal Stage)  After a seizure, most patients experience confusion, fatigue, muscle pain and/or a headache. Thus, one should permit the individual to sleep. For the next few days, reassurance is essential. Being calm and helping reorient the person is also of importance.  Most seizures are painless and end spontaneously. Seizures are not harmful to others but can lead to complications such as stress on the lungs, brain and the heart. Individuals with prior lung problems may develop labored breathing and respiratory distress.     Orders Placed This Encounter  Procedures   MR BRAIN W WO CONTRAST    Meds ordered this encounter  Medications   topiramate  (TOPAMAX ) 200 MG tablet    Sig: Take 1 tablet (200 mg total) by mouth 2 (two) times daily.    Dispense:  180 tablet    Refill:  3   levETIRAcetam  (KEPPRA ) 1000 MG tablet    Sig: Take 1 tablet (1,000 mg total) by mouth 2 (two) times daily.    Dispense:  180 tablet    Refill:  3    Return in about 6 months (around 01/28/2025).    Pastor Falling, MD 07/28/2024, 5:23 PM  [facility] [address] [place], [place] [address] [phone]

## 2024-08-01 ENCOUNTER — Other Ambulatory Visit: Payer: Self-pay | Admitting: Neurology

## 2024-08-01 DIAGNOSIS — D320 Benign neoplasm of cerebral meninges: Secondary | ICD-10-CM

## 2024-08-01 MED ORDER — LEVETIRACETAM 1000 MG PO TABS
1000.0000 mg | ORAL_TABLET | Freq: Two times a day (BID) | ORAL | 3 refills | Status: AC
Start: 2024-08-01 — End: 2025-07-27

## 2024-08-01 MED ORDER — TOPIRAMATE 200 MG PO TABS: 200.0000 mg | ORAL_TABLET | Freq: Two times a day (BID) | ORAL | 3 refills | Status: AC

## 2024-08-04 ENCOUNTER — Telehealth: Payer: Self-pay | Admitting: Neurology

## 2024-08-04 NOTE — Telephone Encounter (Signed)
 [employer] shara: [id number] exp. 09/18/24 sent to [facility] [phone]

## 2024-08-06 ENCOUNTER — Telehealth: Payer: Self-pay | Admitting: Neurology

## 2024-08-06 NOTE — Telephone Encounter (Signed)
 Pt called to inform that referral expired and and would like MD to send over new Referral for Physical Therapy    [facility]. Phone: [phone]

## 2024-08-18 ENCOUNTER — Other Ambulatory Visit: Payer: Self-pay | Admitting: Neurology

## 2024-08-26 ENCOUNTER — Ambulatory Visit: Admitting: Physical Therapy

## 2024-08-28 ENCOUNTER — Ambulatory Visit: Admitting: Physical Therapy

## 2024-09-03 ENCOUNTER — Ambulatory Visit: Admitting: Physical Therapy

## 2024-09-05 ENCOUNTER — Ambulatory Visit: Admitting: Physical Therapy

## 2024-09-10 ENCOUNTER — Ambulatory Visit: Admitting: Physical Therapy

## 2024-09-12 ENCOUNTER — Ambulatory Visit: Admitting: Physical Therapy

## 2024-09-16 ENCOUNTER — Ambulatory Visit: Attending: Physician Assistant | Admitting: Physical Therapy

## 2024-09-16 VITALS — BP 109/76 | HR 57

## 2024-09-16 DIAGNOSIS — M6281 Muscle weakness (generalized): Secondary | ICD-10-CM | POA: Insufficient documentation

## 2024-09-16 DIAGNOSIS — R278 Other lack of coordination: Secondary | ICD-10-CM | POA: Insufficient documentation

## 2024-09-16 DIAGNOSIS — R42 Dizziness and giddiness: Secondary | ICD-10-CM | POA: Diagnosis present

## 2024-09-16 DIAGNOSIS — D320 Benign neoplasm of cerebral meninges: Secondary | ICD-10-CM | POA: Diagnosis present

## 2024-09-16 DIAGNOSIS — R2681 Unsteadiness on feet: Secondary | ICD-10-CM | POA: Diagnosis present

## 2024-09-16 DIAGNOSIS — R2689 Other abnormalities of gait and mobility: Secondary | ICD-10-CM | POA: Diagnosis present

## 2024-09-16 NOTE — Therapy (Signed)
 OUTPATIENT PHYSICAL THERAPY NEURO EVALUATION   Patient Name: Evelyn [name] MRN: [id number] DOB:[birth year], 53 y.o., female Today's Date: 09/17/2024   PCP: Valma Lannie DELENA DEVONNA REFERRING PROVIDER: Valma Lannie DELENA, PA-C; Dr. Gregg  END OF SESSION:  PT End of Session - 09/16/24 1102     Visit Number 1    Number of Visits 13   with eval   Date for Recertification  11/11/24    Authorization Type [employer]    PT Start Time 1100    PT Stop Time 1150    PT Time Calculation (min) 50 min    Equipment Utilized During Treatment Gait belt    Activity Tolerance Patient tolerated treatment well    Behavior During Therapy WFL for tasks assessed/performed;Anxious          Past Medical History:  Diagnosis Date   Anemia    Anxiety    Depression    Family history of breast cancer    Family history of colon cancer    GERD (gastroesophageal reflux disease)    Headache    Obesity    Seizures (HCC)    Sleep apnea    Past Surgical History:  Procedure Laterality Date   ABDOMINAL HYSTERECTOMY     APPLICATION OF CRANIAL NAVIGATION N/A 02/08/2023   Procedure: APPLICATION OF CRANIAL NAVIGATION;  Surgeon: Cheryle Debby DELENA, MD;  Location: [facility] OR;  Service: Neurosurgery;  Laterality: N/A;   BREAST LUMPECTOMY Left 1997   CHOLECYSTECTOMY     CRANIOTOMY Left 02/08/2023   Procedure: Left craniotomy for tumor resection with stealth navigation;  Surgeon: Cheryle Debby DELENA, MD;  Location: [facility] OR;  Service: Neurosurgery;  Laterality: Left;   GASTRIC BYPASS  03/2017   THROAT SURGERY     removed scar tissue from throat d/t being intubated.    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) 03/12/2023   Fever 03/12/2023   Acute pulmonary embolism (HCC) 03/11/2023   Pulmonary embolism on left 03/09/2023   Abdominal pain 03/09/2023   Normocytic anemia 03/09/2023   Adjustment disorder with anxious mood 02/23/2023   Adjustment reaction with anxiety and depression 02/23/2023   Atypical intracranial meningioma (HCC) 02/22/2023   Meningioma, cerebral (HCC) 02/15/2023   Brain tumor (HCC) 02/08/2023   Boil 03/15/2022   Patellofemoral arthritis of left knee 02/08/2018   Thiamine deficiency 10/23/2017   Vitamin D  deficiency 10/23/2017   Anxiety 08/09/2017   H/O gastric bypass 05/22/2017   Postgastrectomy malabsorption 05/22/2017   Obesity, Class III, BMI 40-49.9 (morbid obesity) 04/16/2017   Chronic low back pain 04/06/2017   Essential hypertension 04/06/2017   Lumbar disc disease 01/16/2017   Other hyperlipidemia 01/16/2017   Prediabetes 01/16/2017   OSA (obstructive sleep apnea) 01/16/2017   Moderate episode of recurrent major depressive disorder (HCC) 11/09/2016    ONSET DATE: 08/19/2024 (referral date)  REFERRING DIAG: R53.1 (ICD-10-CM) - Weakness R26.89 (ICD-10-CM) - Other abnormalities of gait and mobility D32.9 (ICD-10-CM) - Benign neoplasm of meninges, unspecified  THERAPY DIAG:  Meningioma, cerebral (HCC)  Muscle weakness (generalized)  Other abnormalities of gait and mobility  Other lack of coordination  Unsteadiness on  feet  Dizziness and giddiness  Rationale for Evaluation and Treatment: Rehabilitation  SUBJECTIVE:                                                                                                                                                                                             SUBJECTIVE STATEMENT:  Pt reports that in 2023 she started having seizures, found out she had a tumor in her brain, in 2024 she had brain surgery and had the meningioma removed from the R side of  her brain so the L side of her body has been affected. 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 She reports that she continues to have falls, gets dizzy spells, has blurry vision, etc. She reports that her LLE will give out on her at times and can have trouble feeling where her L leg is. She last saw Dr. Gregg on 8/4 and he suggested she start using a cane but she wasn't sure what type to use.  She is unable to stand to cook, has to microwave meals. Currently she is using a shower seat as she is afraid to stand up in the shower. Getting herself cleaned and dressed takes a lot of time, she gets dizzy if she stands up too fast or bends forwards. She currently lives alone but plans to move in with her mom soon.  She has also been having memory problems, her mom has to call her to remind her to take her medications. Educated her that she can set a timer/reminder on her phone for this, her son will help her set it up.   Pt accompanied by: family member aunt [name]; no driving - still having seizures  PERTINENT HISTORY: PMH: GERD, anxiety, depression, seizures, sleep apnea, obesity  PAIN:  Are you having pain? Yes: NPRS scale: not rated Pain location: shoulders, knees, thighs, L>R Pain description: not described Aggravating factors: not verbalized Relieving factors: not verbalized  PRECAUTIONS: Fall  RED FLAGS: None   WEIGHT BEARING RESTRICTIONS: No  FALLS: Has patient fallen in last 6 months? Yes. Number of falls being very cautious so does more stumbling than falls, multiple times per day, reaching out for stuff  LIVING ENVIRONMENT: Lives with: lives alone; plans to move in with her mom Lives in: House/apartment Stairs: No, hard wood floors Has following equipment at home: None  PLOF: Independent with basic ADLs, Independent with gait, Independent with transfers, and Requires assistive device for independence  PATIENT GOALS: gain some strength  back into this L side I dont want to be completely immobile  OBJECTIVE:  Note: Objective measures were completed at Evaluation unless otherwise noted.  DIAGNOSTIC FINDINGS:   Brain MRI 12/07/2023 IMPRESSION: 1. Stable to slightly decreased size of residual tumor in the left anterior cranial fossa measuring 6 x 26 x 21 mm. 2. Stable vasogenic edema in the left frontal lobe. 3. No acute intracranial abnormality or significant interval change.  COGNITION: Overall cognitive status: Impaired; has noticed issues with memory, problem-solving, etc.   SENSATION: Has N/T in feet, feet go numb sometimes Impaired proprioception in LLE>RLE Decreased light touch sensation in BLE LLE>RLE  COORDINATION: Impaired in L hemibody  EDEMA:  In LLE>RLE, wears support socks  POSTURE: rounded shoulders, forward head, and posterior pelvic tilt  LOWER EXTREMITY ROM:   WFL  Passive  Right Eval Left Eval  Hip flexion    Hip extension    Hip abduction    Hip adduction    Hip internal rotation    Hip external rotation    Knee flexion    Knee extension    Ankle dorsiflexion    Ankle plantarflexion    Ankle inversion    Ankle eversion     (Blank rows = not tested)  LOWER EXTREMITY MMT:    MMT Right Eval Left Eval  Hip flexion 5 2+  Hip extension    Hip abduction    Hip adduction    Hip internal rotation    Hip external rotation    Knee flexion 5 2+  Knee extension 5 2+  Ankle dorsiflexion 5 2+  Ankle plantarflexion    Ankle inversion    Ankle eversion    (Blank rows = not tested)  BED MOBILITY:  Mod I (sleeps in regular bed, had to pull from mattress, little rough)  TRANSFERS: Sit to stand: Modified independence  Assistive device utilized: None     Stand to sit: Modified independence  Assistive device utilized: None     Chair to chair: Modified independence  Assistive device utilized: None       *needs increased time  RAMP:  Not tested  CURB:  Not  tested  STAIRS: Not tested GAIT: Findings:  Gait pattern: decreased hip/knee flexion- Left, decreased ankle dorsiflexion- Left, and genu recurvatum- Left Distance walked: various clinic distances Assistive device utilized: None Level of assistance: SBA Comments: to be further assessed in a functional context   FUNCTIONAL TESTS:    [facility] PT Assessment - 09/16/24 1130       Ambulation/Gait   Gait velocity 32.8 ft over 13.28 sec = 2.47 ft/sec      Standardized Balance Assessment   Standardized Balance Assessment Timed Up and Go Test;Five Times Sit to Stand    Five times sit to stand comments  49.25 sec   needs BUE support, L knee pain>R knee pain     Timed Up and Go Test   TUG Normal TUG    Normal TUG (seconds) 15.6   no AD     Functional Gait  Assessment   Gait assessed  Yes    Gait Level Surface Walks 20 ft, slow speed, abnormal gait pattern, evidence for imbalance or deviates 10-15 in outside of the 12 in walkway width. Requires more than 7 sec to ambulate 20 ft.    Change in Gait Speed Able to change speed, demonstrates mild gait deviations, deviates 6-10 in outside of the 12 in walkway width, or no gait deviations, unable to achieve a major change in velocity, or uses a change in velocity, or uses  an assistive device.    Gait with Horizontal Head Turns Performs head turns with moderate changes in gait velocity, slows down, deviates 10-15 in outside 12 in walkway width but recovers, can continue to walk.    Gait with Vertical Head Turns Performs task with severe disruption of gait (eg, staggers 15 in outside 12 in walkway width, loses balance, stops, reaches for wall).    Gait and Pivot Turn Cannot turn safely, requires assistance to turn and stop.    Step Over Obstacle Cannot perform without assistance.    Gait with Narrow Base of Support Ambulates less than 4 steps heel to toe or cannot perform without assistance.    Gait with Eyes Closed Walks 20 ft, slow speed, abnormal gait  pattern, evidence for imbalance, deviates 10-15 in outside 12 in walkway width. Requires more than 9 sec to ambulate 20 ft.    Ambulating Backwards Walks 20 ft, slow speed, abnormal gait pattern, evidence for imbalance, deviates 10-15 in outside 12 in walkway width.    Steps Two feet to a stair, must use rail.    Total Score 6    FGA comment: 6/30, high fall risk          VITALS: Vitals:   09/16/24 1124  BP: 109/76  Pulse: (!) 57   Reassessed HR with SpO2 monitor: 55 bpm, SpO2 97%                                                                                                                              TREATMENT DATE:  PT Evaluation   PATIENT EDUCATION: Education details: Eval findings, PT POC, results of OM and functional implications Person educated: Patient and aunt [name] Education method: Explanation, Demonstration, Tactile cues, and Verbal cues Education comprehension: verbalized understanding, returned demonstration, and needs further education  HOME EXERCISE PROGRAM: To be initiated  GOALS: Goals reviewed with patient? Yes  SHORT TERM GOALS: Target date: 10/07/2024   Pt will be independent with initial HEP for improved strength, balance, transfers and gait. Baseline: Goal status: INITIAL  2.  Pt will improve 5 x STS to less than or equal to 46 seconds to demonstrate improved functional strength and transfer efficiency.  Baseline: 49.25 sec with BUE (9/23) Goal status: INITIAL  3.  Pt will improve gait velocity to at least 2.75 ft/sec for improved gait efficiency and performance at SBA level  Baseline: 2.47 ft/sec no AD SBA (9/23) Goal status: INITIAL  4.  Pt will improve FGA to 10/30 for decreased fall risk  Baseline: 6/30 (9/23) Goal status: INITIAL  5.  vs to be assessed and STG set Baseline:  Goal status: INITIAL     LONG TERM GOALS: Target date: 10/28/2024   Pt will be independent with final HEP for improved strength, balance,  transfers and gait. Baseline:  Goal status: INITIAL  2.  Pt will improve 5 x STS to less than or equal to 43 seconds  to demonstrate improved functional strength and transfer efficiency.  Baseline: 49.25 sec with BUE (9/23) Goal status: INITIAL  3.  Pt will improve gait velocity to at least 3.0 ft/sec for improved gait efficiency and performance at mod I level  Baseline: 2.47 ft/sec no AD SBA (9/23) Goal status: INITIAL  4.  Pt will improve FGA to 14/30 for decreased fall risk  Baseline: 6/30 (9/23) Goal status: INITIAL  5.  Pt will improve normal TUG to less than or equal to 13 seconds for improved functional mobility and decreased fall risk. Baseline: 15.6 sec no AD (9/23) Goal status: INITIAL  6.  vs to be assessed and LTG set Baseline:  Goal status: INITIAL  ASSESSMENT:  CLINICAL IMPRESSION: Patient is a 53 year old female referred to Neuro OPPT for L-sided weakness and balance impairments following a meningioma removal.   Pt's PMH is significant for: GERD, anxiety, depression, seizures, sleep apnea, obesity. The following deficits were present during the exam: sensory impairments, decreased LLE strength, and impaired balance. Based on her significant fall history, 5xSTS score of 49.25 sec, gait speed of 2.47 ft/sec, TUG score of 15.6, and FGA score of 6/30, pt is an increased risk for falls. Pt would benefit from skilled PT to address these impairments and functional limitations to maximize functional mobility independence.   OBJECTIVE IMPAIRMENTS: Abnormal gait, cardiopulmonary status limiting activity, decreased activity tolerance, decreased balance, decreased cognition, decreased coordination, decreased endurance, decreased knowledge of use of DME, decreased mobility, difficulty walking, decreased ROM, decreased strength, dizziness, impaired perceived functional ability, impaired sensation, impaired UE functional use, impaired vision/preception, improper body mechanics,  postural dysfunction, and pain.   ACTIVITY LIMITATIONS: carrying, lifting, bending, standing, squatting, stairs, transfers, bed mobility, reach over head, hygiene/grooming, and locomotion level  PARTICIPATION LIMITATIONS: meal prep, cleaning, laundry, medication management, interpersonal relationship, driving, shopping, and community activity  PERSONAL FACTORS: Time since onset of injury/illness/exacerbation and 3+ comorbidities:   GERD, anxiety, depression, seizures, sleep apnea, obesityare also affecting patient's functional outcome.   REHAB POTENTIAL: Good  CLINICAL DECISION MAKING: Unstable/unpredictable  EVALUATION COMPLEXITY: High  PLAN:  PT FREQUENCY: 2x/week  PT DURATION: 6 weeks  PLANNED INTERVENTIONS: 97164- PT Re-evaluation, 97750- Physical Performance Testing, 97110-Therapeutic exercises, 97530- Therapeutic activity, 97112- Neuromuscular re-education, 97535- Self Care, [id number]- Manual therapy, [phone]- Gait training, [phone]- Orthotic Initial, [phone]- Orthotic/Prosthetic subsequent, [phone]- Aquatic Therapy, Patient/Family education, Balance training, Stair training, Taping, Joint mobilization, Spinal mobilization, Vestibular training, Visual/preceptual remediation/compensation, Cognitive remediation, DME instructions, Cryotherapy, and Moist heat  PLAN FOR NEXT SESSION: assess vs and set STG/LTG, did we get order for Speech Therapy? (slurred speech, impaired memory), handout for bedrail, work on balance, assess/address dizziness?, trial SPC vs SBQC   Waddell Southgate, PT Waddell Southgate, PT, DPT, CSRS  09/17/2024, 8:10 AM  For all possible CPT codes, reference the Planned Interventions line above.     Check all conditions that are expected to impact treatment: {Conditions expected to impact treatment:Neurological condition and/or seizures   If treatment provided at initial evaluation, no treatment charged due to lack of authorization.

## 2024-09-17 ENCOUNTER — Other Ambulatory Visit: Payer: Self-pay | Admitting: Neurology

## 2024-09-17 ENCOUNTER — Telehealth: Payer: Self-pay | Admitting: Physical Therapy

## 2024-09-17 DIAGNOSIS — R413 Other amnesia: Secondary | ICD-10-CM

## 2024-09-17 DIAGNOSIS — R4781 Slurred speech: Secondary | ICD-10-CM

## 2024-09-17 NOTE — Telephone Encounter (Signed)
 Done. Thank you   Dr. Gregg

## 2024-09-17 NOTE — Telephone Encounter (Signed)
 Dr. Gregg,  Adams Marko Fireman  was evaluated by PT on 09/17/2024.  The patient would benefit from a Speech Therapy evaluation for impaired memory and occasional slurring of her speech.   If you agree, please place an order in [facility] workque in [facility] or fax the order to [phone].  Thank you,  Waddell Southgate, PT, DPT, [facility] [address] [place], [place]  [address] Phone:  [phone] Fax:  [phone]

## 2024-09-23 ENCOUNTER — Ambulatory Visit: Admitting: Physical Therapy

## 2024-09-23 VITALS — BP 108/74 | HR 64

## 2024-09-23 DIAGNOSIS — R2689 Other abnormalities of gait and mobility: Secondary | ICD-10-CM

## 2024-09-23 DIAGNOSIS — D320 Benign neoplasm of cerebral meninges: Secondary | ICD-10-CM | POA: Diagnosis not present

## 2024-09-23 DIAGNOSIS — R2681 Unsteadiness on feet: Secondary | ICD-10-CM

## 2024-09-23 DIAGNOSIS — R278 Other lack of coordination: Secondary | ICD-10-CM

## 2024-09-23 DIAGNOSIS — R42 Dizziness and giddiness: Secondary | ICD-10-CM

## 2024-09-23 DIAGNOSIS — M6281 Muscle weakness (generalized): Secondary | ICD-10-CM

## 2024-09-23 NOTE — Therapy (Signed)
 OUTPATIENT PHYSICAL THERAPY NEURO TREATMENT   Patient Name: Evelyn [name] MRN: [id number] DOB:December 13, 1971, 53 y.o., female Today's Date: 09/23/2024   PCP: Valma Lannie DELENA DEVONNA REFERRING PROVIDER: Valma Lannie DELENA, PA-C; Dr. Gregg  END OF SESSION:  PT End of Session - 09/23/24 1015     Visit Number 2    Number of Visits 13   with eval   Date for Recertification  11/11/24    Authorization Type [employer]    PT Start Time 1013    PT Stop Time 1100    PT Time Calculation (min) 47 min    Equipment Utilized During Treatment Gait belt    Activity Tolerance Patient tolerated treatment well    Behavior During Therapy WFL for tasks assessed/performed;Anxious           Past Medical History:  Diagnosis Date   Anemia    Anxiety    Depression    Family history of breast cancer    Family history of colon cancer    GERD (gastroesophageal reflux disease)    Headache    Obesity    Seizures (HCC)    Sleep apnea    Past Surgical History:  Procedure Laterality Date   ABDOMINAL HYSTERECTOMY     APPLICATION OF CRANIAL NAVIGATION N/A 02/08/2023   Procedure: APPLICATION OF CRANIAL NAVIGATION;  Surgeon: Cheryle Debby DELENA, MD;  Location: [facility] OR;  Service: Neurosurgery;  Laterality: N/A;   BREAST LUMPECTOMY Left 1997   CHOLECYSTECTOMY     CRANIOTOMY Left 02/08/2023   Procedure: Left craniotomy for tumor resection with stealth navigation;  Surgeon: Cheryle Debby DELENA, MD;  Location: [facility] OR;  Service: Neurosurgery;  Laterality: Left;   GASTRIC BYPASS  03/2017   THROAT SURGERY     removed scar tissue from throat d/t being intubated.    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) 03/12/2023   Fever 03/12/2023   Acute pulmonary embolism (HCC) 03/11/2023   Pulmonary embolism on left 03/09/2023   Abdominal pain 03/09/2023   Normocytic anemia 03/09/2023   Adjustment disorder with anxious mood 02/23/2023   Adjustment reaction with anxiety and depression 02/23/2023   Atypical intracranial meningioma (HCC) 02/22/2023   Meningioma, cerebral (HCC) 02/15/2023   Brain tumor (HCC) 02/08/2023   Boil 03/15/2022   Patellofemoral arthritis of left knee 02/08/2018   Thiamine deficiency 10/23/2017   Vitamin D  deficiency 10/23/2017   Anxiety 08/09/2017   H/O gastric bypass 05/22/2017   Postgastrectomy malabsorption 05/22/2017   Obesity, Class III, BMI 40-49.9 (morbid obesity) 04/16/2017   Chronic low back pain 04/06/2017   Essential hypertension 04/06/2017   Lumbar disc disease 01/16/2017   Other hyperlipidemia 01/16/2017   Prediabetes 01/16/2017   OSA (obstructive sleep apnea) 01/16/2017   Moderate episode of recurrent major depressive disorder (HCC) 11/09/2016    ONSET DATE: 08/19/2024 (referral date)  REFERRING DIAG: R53.1 (ICD-10-CM) - Weakness R26.89 (ICD-10-CM) - Other abnormalities of gait and mobility D32.9 (ICD-10-CM) - Benign neoplasm of meninges, unspecified  THERAPY DIAG:  Meningioma, cerebral (HCC)  Muscle weakness (generalized)  Other abnormalities of gait and mobility  Other lack of coordination  Unsteadiness  on feet  Dizziness and giddiness  Rationale for Evaluation and Treatment: Rehabilitation  SUBJECTIVE:                                                                                                                                                                                             SUBJECTIVE STATEMENT: Pt reports that after last visit her dizziness continued and her BP dropped, she was diagnosed with vertigo after going to see her doctor. She was prescribed medication and will  pick that up today. Of note, she was having these symptoms prior to her PT evaluation but because they worsened she sough out medical care. She reports that due to the dizziness she has had several near falls, has to catch herself and sit for a few minutes to get her bearings, she also hears a buzzing in her ears at times. She also has been having pain in her R shoulder (see more details below in pain section).   From PT eval: Pt reports that in 2023 she started having seizures, found out she had a tumor in her brain, in 2024 she had brain surgery and had the meningioma removed from the R side of her brain so the L side of her body has been affected. 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 She reports that she continues to have falls, gets dizzy spells, has blurry vision, etc. She reports that her LLE will give out on her at times and can have trouble feeling where her L leg is. She last saw Dr. Gregg on 8/4 and he suggested she start using a cane but she wasn't sure what type to use.  She is unable to stand to cook, has to microwave meals. Currently she is using a shower seat as she is afraid to stand up in the shower. Getting herself cleaned and dressed takes a lot of time, she gets dizzy if she stands up too fast or bends forwards. She currently lives alone but plans to move in with her mom soon.  She has also been having memory problems, her mom has to call her to remind her to take her medications. Educated her that she can set a timer/reminder on her phone for this, her son will help her set it up.   Pt accompanied by: family member aunt [name]; no driving - still having seizures  PERTINENT HISTORY: PMH: GERD, anxiety, depression, seizures, sleep apnea, obesity  PAIN:  Are you having pain? Yes: NPRS scale: 7.5-8/10 Pain location: R shoulder Pain description: hurts, sharp and achy Aggravating factors: trying to  use it, trying to reach out and reach  overhead; lifting, laying on R side Relieving factors: being still, not moving it  PRECAUTIONS: Fall  RED FLAGS: None   WEIGHT BEARING RESTRICTIONS: No  FALLS: Has patient fallen in last 6 months? Yes. Number of falls being very cautious so does more stumbling than falls, multiple times per day, reaching out for stuff  LIVING ENVIRONMENT: Lives with: lives alone; plans to move in with her mom Lives in: House/apartment Stairs: No, hard wood floors Has following equipment at home: None  PLOF: Independent with basic ADLs, Independent with gait, Independent with transfers, and Requires assistive device for independence  PATIENT GOALS: gain some strength back into this L side I dont want to be completely immobile  OBJECTIVE:  Note: Objective measures were completed at Evaluation unless otherwise noted.  DIAGNOSTIC FINDINGS:   Brain MRI 12/07/2023 IMPRESSION: 1. Stable to slightly decreased size of residual tumor in the left anterior cranial fossa measuring 6 x 26 x 21 mm. 2. Stable vasogenic edema in the left frontal lobe. 3. No acute intracranial abnormality or significant interval change.  COGNITION: Overall cognitive status: Impaired; has noticed issues with memory, problem-solving, etc.   SENSATION: Has N/T in feet, feet go numb sometimes Impaired proprioception in LLE>RLE Decreased light touch sensation in BLE LLE>RLE  COORDINATION: Impaired in L hemibody  EDEMA:  In LLE>RLE, wears support socks  POSTURE: rounded shoulders, forward head, and posterior pelvic tilt  LOWER EXTREMITY ROM:   WFL  Passive  Right Eval Left Eval  Hip flexion    Hip extension    Hip abduction    Hip adduction    Hip internal rotation    Hip external rotation    Knee flexion    Knee extension    Ankle dorsiflexion    Ankle plantarflexion    Ankle inversion    Ankle eversion     (Blank rows = not tested)  LOWER EXTREMITY MMT:    MMT Right Eval Left Eval  Hip flexion  5 2+  Hip extension    Hip abduction    Hip adduction    Hip internal rotation    Hip external rotation    Knee flexion 5 2+  Knee extension 5 2+  Ankle dorsiflexion 5 2+  Ankle plantarflexion    Ankle inversion    Ankle eversion    (Blank rows = not tested)  BED MOBILITY:  Mod I (sleeps in regular bed, had to pull from mattress, little rough)  TRANSFERS: Sit to stand: Modified independence  Assistive device utilized: None     Stand to sit: Modified independence  Assistive device utilized: None     Chair to chair: Modified independence  Assistive device utilized: None       *needs increased time  RAMP:  Not tested  CURB:  Not tested  STAIRS: Not tested GAIT: Findings:  Gait pattern: decreased hip/knee flexion- Left, decreased ankle dorsiflexion- Left, and genu recurvatum- Left Distance walked: various clinic distances Assistive device utilized: None Level of assistance: SBA Comments: to be further assessed in a functional context   FUNCTIONAL TESTS:    [facility] PT Assessment - 09/23/24 1043       6 minute walk test results    Aerobic Endurance Distance Walked 600    Endurance additional comments 4/10 RPE           VITALS: Vitals:   09/23/24 1033  BP: 108/74  Pulse: 64  TREATMENT DATE:    Self-Care/Home Management BP assessed in LUE at rest, vitals WNL and HR more WNL this visit as compared to initial eval   Gait Gait pattern: decreased hip/knee flexion- Left and decreased ankle dorsiflexion- Left Distance walked: various clinic distances Assistive device utilized: stand alone cane Level of assistance: Modified independence Comments: trial gait with stand alone cane; pt able to keep her head up during gait and exhibits increased gait speed; feels much more stable with this vs no device - becomes emotional  Provided  emotional support and provided handout for where to purchase hurrycane online.    Physical Performance For initial goal assessment:  [facility] PT Assessment - 09/23/24 1043       6 minute walk test results    Aerobic Endurance Distance Walked 600    Endurance additional comments 4/10 RPE         Mental load: 9/10; has to look down at LLE; slow gait speed, more the focus on the balance and the LLE that was exhausting   PATIENT EDUCATION: Education details: results of OM and functional implications, where to purchase stand alone cane online Person educated: Patient Education method: Explanation, Demonstration, Tactile cues, and Verbal cues Education comprehension: verbalized understanding, returned demonstration, and needs further education  HOME EXERCISE PROGRAM: To be initiated  GOALS: Goals reviewed with patient? Yes  SHORT TERM GOALS: Target date: 10/07/2024   Pt will be independent with initial HEP for improved strength, balance, transfers and gait. Baseline: Goal status: INITIAL  2.  Pt will improve 5 x STS to less than or equal to 46 seconds to demonstrate improved functional strength and transfer efficiency.  Baseline: 49.25 sec with BUE (9/23) Goal status: INITIAL  3.  Pt will improve gait velocity to at least 2.75 ft/sec for improved gait efficiency and performance at SBA level  Baseline: 2.47 ft/sec no AD SBA (9/23) Goal status: INITIAL  4.  Pt will improve FGA to 10/30 for decreased fall risk  Baseline: 6/30 (9/23) Goal status: INITIAL  5.  Pt will ambulate greater than or equal to 850 feet on with LRAD and mod I for improved cardiovascular endurance and BLE strength.  Baseline: 600 ft no AD (9/30) Goal status: INITIAL     LONG TERM GOALS: Target date: 10/28/2024   Pt will be independent with final HEP for improved strength, balance, transfers and gait. Baseline:  Goal status: INITIAL  2.  Pt will improve 5 x STS to less than or equal to 43  seconds to demonstrate improved functional strength and transfer efficiency.  Baseline: 49.25 sec with BUE (9/23) Goal status: INITIAL  3.  Pt will improve gait velocity to at least 3.0 ft/sec for improved gait efficiency and performance at mod I level  Baseline: 2.47 ft/sec no AD SBA (9/23) Goal status: INITIAL  4.  Pt will improve FGA to 14/30 for decreased fall risk  Baseline: 6/30 (9/23) Goal status: INITIAL  5.  Pt will improve normal TUG to less than or equal to 13 seconds for improved functional mobility and decreased fall risk. Baseline: 15.6 sec no AD (9/23) Goal status: INITIAL  6.  Pt will ambulate greater than or equal to 1000 feet on with LRAD and mod I for improved cardiovascular endurance and BLE strength.  Baseline: 600 ft no AD (9/30) Goal status: INITIAL  ASSESSMENT:  CLINICAL IMPRESSION: Emphasis of skilled PT session on assessing for endurance assessment followed by trial of stand alone cane with  gait. Pt does exhibit impaired endurance based on age-related norms but is more limited by her balance and fear of falling than her actual endurance during test. With trial of stand alone cane she exhibits significantly improved gait speed and confidence in her gait. Provided information on where she can purchase this device online. Plan to continue to practice with this device in therapy sessions. Will further assess R shoulder in future sessions as well if this continues to bother her. Continue POC.    OBJECTIVE IMPAIRMENTS: Abnormal gait, cardiopulmonary status limiting activity, decreased activity tolerance, decreased balance, decreased cognition, decreased coordination, decreased endurance, decreased knowledge of use of DME, decreased mobility, difficulty walking, decreased ROM, decreased strength, dizziness, impaired perceived functional ability, impaired sensation, impaired UE functional use, impaired vision/preception, improper body mechanics, postural  dysfunction, and pain.   ACTIVITY LIMITATIONS: carrying, lifting, bending, standing, squatting, stairs, transfers, bed mobility, reach over head, hygiene/grooming, and locomotion level  PARTICIPATION LIMITATIONS: meal prep, cleaning, laundry, medication management, interpersonal relationship, driving, shopping, and community activity  PERSONAL FACTORS: Time since onset of injury/illness/exacerbation and 3+ comorbidities:   GERD, anxiety, depression, seizures, sleep apnea, obesityare also affecting patient's functional outcome.   REHAB POTENTIAL: Good  CLINICAL DECISION MAKING: Unstable/unpredictable  EVALUATION COMPLEXITY: High  PLAN:  PT FREQUENCY: 2x/week  PT DURATION: 6 weeks  PLANNED INTERVENTIONS: 02835- PT Re-evaluation, 97750- Physical Performance Testing, 97110-Therapeutic exercises, 97530- Therapeutic activity, [id number]- Neuromuscular re-education, 97535- Self Care, 02859- Manual therapy, [phone]- Gait training, [phone]- Orthotic Initial, [phone]- Orthotic/Prosthetic subsequent, [phone]- Aquatic Therapy, Patient/Family education, Balance training, Stair training, Taping, Joint mobilization, Spinal mobilization, Vestibular training, Visual/preceptual remediation/compensation, Cognitive remediation, DME instructions, Cryotherapy, and Moist heat  PLAN FOR NEXT SESSION: did we get order for Speech Therapy (yes, did she schedule)? (slurred speech, impaired memory), handout for bedrail, work on balance and LLE strengthening, continue to work with stand alone cane, address R shoulder pain; remind her not to take vertigo meds before Tues appt - Chloe can you please assess her vertigo on Tuesday?   Marylouise Mallet, PT [place], PT, DPT, CSRS  09/23/2024, 11:11 AM  For all possible CPT codes, reference the Planned Interventions line above.     Check all conditions that are expected to impact treatment: {Conditions expected to impact treatment:Neurological condition and/or seizures   If  treatment provided at initial evaluation, no treatment charged due to lack of authorization.

## 2024-09-26 ENCOUNTER — Ambulatory Visit: Attending: Physician Assistant | Admitting: Physical Therapy

## 2024-09-26 DIAGNOSIS — R42 Dizziness and giddiness: Secondary | ICD-10-CM | POA: Insufficient documentation

## 2024-09-26 DIAGNOSIS — R2681 Unsteadiness on feet: Secondary | ICD-10-CM | POA: Insufficient documentation

## 2024-09-26 DIAGNOSIS — M6281 Muscle weakness (generalized): Secondary | ICD-10-CM | POA: Insufficient documentation

## 2024-09-26 DIAGNOSIS — R278 Other lack of coordination: Secondary | ICD-10-CM | POA: Insufficient documentation

## 2024-09-26 DIAGNOSIS — R2689 Other abnormalities of gait and mobility: Secondary | ICD-10-CM | POA: Insufficient documentation

## 2024-09-26 DIAGNOSIS — D320 Benign neoplasm of cerebral meninges: Secondary | ICD-10-CM | POA: Insufficient documentation

## 2024-09-30 ENCOUNTER — Ambulatory Visit: Admitting: Physical Therapy

## 2024-10-02 ENCOUNTER — Ambulatory Visit: Admitting: Physical Therapy

## 2024-10-03 ENCOUNTER — Ambulatory Visit: Admitting: Physical Therapy

## 2024-10-03 ENCOUNTER — Encounter: Payer: Self-pay | Admitting: Physical Therapy

## 2024-10-03 VITALS — BP 119/71 | HR 63

## 2024-10-03 DIAGNOSIS — M6281 Muscle weakness (generalized): Secondary | ICD-10-CM

## 2024-10-03 DIAGNOSIS — R42 Dizziness and giddiness: Secondary | ICD-10-CM

## 2024-10-03 DIAGNOSIS — R2681 Unsteadiness on feet: Secondary | ICD-10-CM | POA: Diagnosis present

## 2024-10-03 DIAGNOSIS — R2689 Other abnormalities of gait and mobility: Secondary | ICD-10-CM | POA: Diagnosis present

## 2024-10-03 DIAGNOSIS — R278 Other lack of coordination: Secondary | ICD-10-CM

## 2024-10-03 DIAGNOSIS — D320 Benign neoplasm of cerebral meninges: Secondary | ICD-10-CM | POA: Diagnosis present

## 2024-10-03 NOTE — Therapy (Signed)
 OUTPATIENT PHYSICAL THERAPY NEURO TREATMENT   Patient Name: Evelyn [name] MRN: [id number] DOB:1971/07/03, 53 y.o., female Today's Date: 10/03/2024   PCP: Valma Lannie DELENA DEVONNA REFERRING PROVIDER: Valma Lannie DELENA, PA-C; Dr. Gregg  END OF SESSION:  PT End of Session - 10/03/24 1529     Visit Number 3    Number of Visits 13   with eval   Date for Recertification  11/11/24    Authorization Type [employer]    PT Start Time 1526    PT Stop Time 1613    PT Time Calculation (min) 47 min    Equipment Utilized During Treatment Gait belt    Activity Tolerance Patient tolerated treatment well    Behavior During Therapy WFL for tasks assessed/performed;Anxious           Past Medical History:  Diagnosis Date   Anemia    Anxiety    Depression    Family history of breast cancer    Family history of colon cancer    GERD (gastroesophageal reflux disease)    Headache    Obesity    Seizures (HCC)    Sleep apnea    Past Surgical History:  Procedure Laterality Date   ABDOMINAL HYSTERECTOMY     APPLICATION OF CRANIAL NAVIGATION N/A 02/08/2023   Procedure: APPLICATION OF CRANIAL NAVIGATION;  Surgeon: Cheryle Debby DELENA, MD;  Location: [facility] OR;  Service: Neurosurgery;  Laterality: N/A;   BREAST LUMPECTOMY Left 1997   CHOLECYSTECTOMY     CRANIOTOMY Left 02/08/2023   Procedure: Left craniotomy for tumor resection with stealth navigation;  Surgeon: Cheryle Debby DELENA, MD;  Location: [facility] OR;  Service: Neurosurgery;  Laterality: Left;   GASTRIC BYPASS  03/2017   THROAT SURGERY     removed scar tissue from throat d/t being intubated.    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) 03/12/2023   Fever 03/12/2023   Acute pulmonary embolism (HCC) 03/11/2023   Pulmonary embolism on left 03/09/2023   Abdominal pain 03/09/2023   Normocytic anemia 03/09/2023   Adjustment disorder with anxious mood 02/23/2023   Adjustment reaction with anxiety and depression 02/23/2023   Atypical intracranial meningioma (HCC) 02/22/2023   Meningioma, cerebral (HCC) 02/15/2023   Brain tumor (HCC) 02/08/2023   Boil 03/15/2022   Patellofemoral arthritis of left knee 02/08/2018   Thiamine deficiency 10/23/2017   Vitamin D  deficiency 10/23/2017   Anxiety 08/09/2017   H/O gastric bypass 05/22/2017   Postgastrectomy malabsorption 05/22/2017   Obesity, Class III, BMI 40-49.9 (morbid obesity) (HCC) 04/16/2017   Chronic low back pain 04/06/2017   Essential hypertension 04/06/2017   Lumbar disc disease 01/16/2017   Other hyperlipidemia 01/16/2017   Prediabetes 01/16/2017   OSA (obstructive sleep apnea) 01/16/2017   Moderate episode of recurrent major depressive disorder (HCC) 11/09/2016    ONSET DATE: 08/19/2024 (referral date)  REFERRING DIAG: R53.1 (ICD-10-CM) - Weakness R26.89 (ICD-10-CM) - Other abnormalities of gait and mobility D32.9 (ICD-10-CM) - Benign neoplasm of meninges, unspecified  THERAPY DIAG:  Muscle weakness (generalized)  Other abnormalities of gait and mobility  Other lack of coordination  Unsteadiness on feet  Dizziness and giddiness  Rationale for Evaluation and Treatment: Rehabilitation  SUBJECTIVE:                                                                                                                                                                                             SUBJECTIVE STATEMENT: Pt reports her shoulder is not bothering her today and she thinks it was arthritis previously.  She did not take her meclizine today and is not dizzy right now.  She denies falls, but had one  LOB posteriorly with a dizzy spell and she was able to catch herself.     From PT eval: Pt reports that in 2023 she started having seizures, found out she had a tumor in her brain, in 2024 she had brain surgery and had the meningioma removed from the R side of her brain so the L side of her body has been affected. 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 She reports that she continues to have falls, gets dizzy spells, has blurry vision, etc. She reports that her LLE will give out on her at times and can have trouble feeling where her L leg is. She last saw Dr. Gregg on 8/4 and he suggested she start using a cane but she wasn't sure what type to use.  She is unable to stand to cook, has to microwave meals. Currently she is using a shower seat as she is afraid to stand up in the shower. Getting herself cleaned and dressed takes a lot of time, she gets dizzy if she stands up too fast or bends forwards. She currently lives alone but plans to move in with her mom soon.  She has also been having memory problems, her mom has to call her to remind her to take her medications. Educated her that she can set a timer/reminder on her phone for this, her son will help her set it up.   Pt accompanied by: family member aunt Evelyn [name]; no driving - still having seizures  PERTINENT HISTORY: PMH: GERD, anxiety, depression, seizures, sleep apnea, obesity  PAIN:  Are you having pain? No  PRECAUTIONS: Fall  RED FLAGS: None   WEIGHT BEARING RESTRICTIONS: No  FALLS: Has patient fallen in last 6 months? Yes. Number of falls being very cautious so does more stumbling than falls, multiple times per day, reaching out for stuff  LIVING ENVIRONMENT: Lives with: lives alone; plans to move in with her mom Lives in: House/apartment Stairs: No, hard wood floors Has following equipment at home: None  PLOF: Independent with basic ADLs,  Independent with gait, Independent with  transfers, and Requires assistive device for independence  PATIENT GOALS: gain some strength back into this L side I dont want to be completely immobile  OBJECTIVE:  Note: Objective measures were completed at Evaluation unless otherwise noted.  DIAGNOSTIC FINDINGS:   Brain MRI 12/07/2023 IMPRESSION: 1. Stable to slightly decreased size of residual tumor in the left anterior cranial fossa measuring 6 x 26 x 21 mm. 2. Stable vasogenic edema in the left frontal lobe. 3. No acute intracranial abnormality or significant interval change.  COGNITION: Overall cognitive status: Impaired; has noticed issues with memory, problem-solving, etc.   SENSATION: Has N/T in feet, feet go numb sometimes Impaired proprioception in LLE>RLE Decreased light touch sensation in BLE LLE>RLE  COORDINATION: Impaired in L hemibody  EDEMA:  In LLE>RLE, wears support socks  POSTURE: rounded shoulders, forward head, and posterior pelvic tilt  LOWER EXTREMITY ROM:   WFL  Passive  Right Eval Left Eval  Hip flexion    Hip extension    Hip abduction    Hip adduction    Hip internal rotation    Hip external rotation    Knee flexion    Knee extension    Ankle dorsiflexion    Ankle plantarflexion    Ankle inversion    Ankle eversion     (Blank rows = not tested)  LOWER EXTREMITY MMT:    MMT Right Eval Left Eval  Hip flexion 5 2+  Hip extension    Hip abduction    Hip adduction    Hip internal rotation    Hip external rotation    Knee flexion 5 2+  Knee extension 5 2+  Ankle dorsiflexion 5 2+  Ankle plantarflexion    Ankle inversion    Ankle eversion    (Blank rows = not tested)  BED MOBILITY:  Mod I (sleeps in regular bed, had to pull from mattress, little rough)  TRANSFERS: Sit to stand: Modified independence  Assistive device utilized: None     Stand to sit: Modified independence  Assistive device utilized: None     Chair to chair: Modified independence  Assistive device  utilized: None       *needs increased time  RAMP:  Not tested  CURB:  Not tested  STAIRS: Not tested GAIT: Findings:  Gait pattern: decreased hip/knee flexion- Left, decreased ankle dorsiflexion- Left, and genu recurvatum- Left Distance walked: various clinic distances Assistive device utilized: None Level of assistance: SBA Comments: to be further assessed in a functional context   FUNCTIONAL TESTS:       VITALS: Vitals:   10/03/24 1526  BP: 119/71  Pulse: 63                                                                                                                                TREATMENT DATE:    Self-Care/Home Management BP assessed in LUE at rest, vitals WNL  Gait Gait pattern: decreased hip/knee flexion- Left and decreased ankle dorsiflexion- Left Distance walked: 115' Assistive device utilized: stand alone cane Level of assistance: Modified independence Comments: Practicing stride, height adjustment of cane, and comfortable pace.  Time spent ambulating over 300 ft level surface practicing left and right turns and tight space negotiations/obstacles w/ standalone cane SBA, pt demonstrates nervousness w/ left turns, cues to improve LUE relaxation and arm swing.  RAMP:  Level of Assistance: SBA Assistive device utilized: standalone cane Ramp Comments: Performed twice w/o LOB.  Pt is tearful and anxious on descent, cued to widen BOS and stride of cane to increase BOS and stability.   CURB:  Level of Assistance: Modified independence Assistive device utilized: standalone cane Curb Comments: Performed x2, pt reports substantial relief due to improved stability on step off.  She retains sequencing cues from stairs.  STAIRS:  Level of Assistance: Modified independence  Stair Negotiation Technique: Alternating Pattern  Forwards With use of AD: standalone cane with Single Rail on Left  Number of Stairs: 4   Height of Stairs: 6  Comments: She  endorses relief with descent and ease of performance.  Pt demonstrates good upright stability, instructed on sequencing.  PATIENT EDUCATION: Education details: Encouraged her to purchase stand alone cane online - her mom bought her a SPC but this does not feel stable so discouraged using this for now Person educated: Patient Education method: Explanation, Demonstration, Tactile cues, and Verbal cues Education comprehension: verbalized understanding, returned demonstration, and needs further education  HOME EXERCISE PROGRAM: To be initiated  GOALS: Goals reviewed with patient? Yes  SHORT TERM GOALS: Target date: 10/07/2024   Pt will be independent with initial HEP for improved strength, balance, transfers and gait. Baseline: Goal status: INITIAL  2.  Pt will improve 5 x STS to less than or equal to 46 seconds to demonstrate improved functional strength and transfer efficiency.  Baseline: 49.25 sec with BUE (9/23) Goal status: INITIAL  3.  Pt will improve gait velocity to at least 2.75 ft/sec for improved gait efficiency and performance at SBA level  Baseline: 2.47 ft/sec no AD SBA (9/23) Goal status: INITIAL  4.  Pt will improve FGA to 10/30 for decreased fall risk  Baseline: 6/30 (9/23) Goal status: INITIAL  5.  Pt will ambulate greater than or equal to 850 feet on with LRAD and mod I for improved cardiovascular endurance and BLE strength.  Baseline: 600 ft no AD (9/30) Goal status: INITIAL     LONG TERM GOALS: Target date: 10/28/2024   Pt will be independent with final HEP for improved strength, balance, transfers and gait. Baseline:  Goal status: INITIAL  2.  Pt will improve 5 x STS to less than or equal to 43 seconds to demonstrate improved functional strength and transfer efficiency.  Baseline: 49.25 sec with BUE (9/23) Goal status: INITIAL  3.  Pt will improve gait velocity to at least 3.0 ft/sec for improved gait efficiency and performance at mod I level   Baseline: 2.47 ft/sec no AD SBA (9/23) Goal status: INITIAL  4.  Pt will improve FGA to 14/30 for decreased fall risk  Baseline: 6/30 (9/23) Goal status: INITIAL  5.  Pt will improve normal TUG to less than or equal to 13 seconds for improved functional mobility and decreased fall risk. Baseline: 15.6 sec no AD (9/23) Goal status: INITIAL  6.  Pt will ambulate greater than or equal to 1000 feet on with LRAD and mod I  for improved cardiovascular endurance and BLE strength.  Baseline: 600 ft no AD (9/30) Goal status: INITIAL  ASSESSMENT:  CLINICAL IMPRESSION: Patient seen for follow-up appt today to better address use of stand alone cane.  She has improved use of cane during turns, but maintains some anxiety particularly with left turns.  Her mother purchased her a SPC, but she is still planning to get the stand alone cane as she feels more steady with this option.  This PT instructed her to take her meclizine as needed/prescribed until we are able to have a vestibular PT formally assess the cause of her dizziness which she was not experiencing today.  Continue POC.    OBJECTIVE IMPAIRMENTS: Abnormal gait, cardiopulmonary status limiting activity, decreased activity tolerance, decreased balance, decreased cognition, decreased coordination, decreased endurance, decreased knowledge of use of DME, decreased mobility, difficulty walking, decreased ROM, decreased strength, dizziness, impaired perceived functional ability, impaired sensation, impaired UE functional use, impaired vision/preception, improper body mechanics, postural dysfunction, and pain.   ACTIVITY LIMITATIONS: carrying, lifting, bending, standing, squatting, stairs, transfers, bed mobility, reach over head, hygiene/grooming, and locomotion level  PARTICIPATION LIMITATIONS: meal prep, cleaning, laundry, medication management, interpersonal relationship, driving, shopping, and community activity  PERSONAL FACTORS: Time since  onset of injury/illness/exacerbation and 3+ comorbidities:   GERD, anxiety, depression, seizures, sleep apnea, obesityare also affecting patient's functional outcome.   REHAB POTENTIAL: Good  CLINICAL DECISION MAKING: Unstable/unpredictable  EVALUATION COMPLEXITY: High  PLAN:  PT FREQUENCY: 2x/week  PT DURATION: 6 weeks  PLANNED INTERVENTIONS: 02835- PT Re-evaluation, 97750- Physical Performance Testing, 97110-Therapeutic exercises, 97530- Therapeutic activity, [id number]- Neuromuscular re-education, 97535- Self Care, [id number]- Manual therapy, [phone]- Gait training, [phone]- Orthotic Initial, [phone]- Orthotic/Prosthetic subsequent, [phone]- Aquatic Therapy, Patient/Family education, Balance training, Stair training, Taping, Joint mobilization, Spinal mobilization, Vestibular training, Visual/preceptual remediation/compensation, Cognitive remediation, DME instructions, Cryotherapy, and Moist heat  PLAN FOR NEXT SESSION: did we get order for Speech Therapy (yes, did she schedule)? (slurred speech, impaired memory), handout for bedrail, work on balance and LLE strengthening, continue to work with stand alone cane, address R shoulder pain; remind her not to take vertigo meds before Tues appt - Evelyn [name] can you please assess her vertigo on Tuesday?   Daved KATHEE Bull, PT, DPT  10/03/2024, 4:22 PM  For all possible CPT codes, reference the Planned Interventions line above.     Check all conditions that are expected to impact treatment: {Conditions expected to impact treatment:Neurological condition and/or seizures   If treatment provided at initial evaluation, no treatment charged due to lack of authorization.

## 2024-10-06 ENCOUNTER — Telehealth: Payer: Self-pay | Admitting: Neurology

## 2024-10-06 ENCOUNTER — Ambulatory Visit: Admitting: Physical Therapy

## 2024-10-06 VITALS — BP 108/74 | HR 59

## 2024-10-06 DIAGNOSIS — R2689 Other abnormalities of gait and mobility: Secondary | ICD-10-CM

## 2024-10-06 DIAGNOSIS — M6281 Muscle weakness (generalized): Secondary | ICD-10-CM | POA: Diagnosis not present

## 2024-10-06 DIAGNOSIS — R42 Dizziness and giddiness: Secondary | ICD-10-CM

## 2024-10-06 DIAGNOSIS — R278 Other lack of coordination: Secondary | ICD-10-CM

## 2024-10-06 DIAGNOSIS — D320 Benign neoplasm of cerebral meninges: Secondary | ICD-10-CM

## 2024-10-06 DIAGNOSIS — R2681 Unsteadiness on feet: Secondary | ICD-10-CM

## 2024-10-06 NOTE — Therapy (Signed)
 OUTPATIENT PHYSICAL THERAPY NEURO TREATMENT   Patient Name: Evelyn [name] MRN: [id number] DOB:1971-02-11, 53 y.o., female Today's Date: 10/06/2024   PCP: Valma Lannie DELENA DEVONNA REFERRING PROVIDER: Valma Lannie DELENA, PA-C; Dr. Gregg  END OF SESSION:  PT End of Session - 10/06/24 1105     Visit Number 4    Number of Visits 13   with eval   Date for Recertification  11/11/24    Authorization Type [employer]    PT Start Time 1100    PT Stop Time 1145    PT Time Calculation (min) 45 min    Equipment Utilized During Treatment Gait belt    Activity Tolerance Patient tolerated treatment well    Behavior During Therapy WFL for tasks assessed/performed;Anxious            Past Medical History:  Diagnosis Date   Anemia    Anxiety    Depression    Family history of breast cancer    Family history of colon cancer    GERD (gastroesophageal reflux disease)    Headache    Obesity    Seizures (HCC)    Sleep apnea    Past Surgical History:  Procedure Laterality Date   ABDOMINAL HYSTERECTOMY     APPLICATION OF CRANIAL NAVIGATION N/A 02/08/2023   Procedure: APPLICATION OF CRANIAL NAVIGATION;  Surgeon: Cheryle Debby DELENA, MD;  Location: [facility] OR;  Service: Neurosurgery;  Laterality: N/A;   BREAST LUMPECTOMY Left 1997   CHOLECYSTECTOMY     CRANIOTOMY Left 02/08/2023   Procedure: Left craniotomy for tumor resection with stealth navigation;  Surgeon: Cheryle Debby DELENA, MD;  Location: [facility] OR;  Service: Neurosurgery;  Laterality: Left;   GASTRIC BYPASS  03/2017   THROAT SURGERY     removed scar tissue from throat d/t being intubated.    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) 03/12/2023   Fever 03/12/2023   Acute pulmonary embolism (HCC) 03/11/2023   Pulmonary embolism on left 03/09/2023   Abdominal pain 03/09/2023   Normocytic anemia 03/09/2023   Adjustment disorder with anxious mood 02/23/2023   Adjustment reaction with anxiety and depression 02/23/2023   Atypical intracranial meningioma (HCC) 02/22/2023   Meningioma, cerebral (HCC) 02/15/2023   Brain tumor (HCC) 02/08/2023   Boil 03/15/2022   Patellofemoral arthritis of left knee 02/08/2018   Thiamine deficiency 10/23/2017   Vitamin D  deficiency 10/23/2017   Anxiety 08/09/2017   H/O gastric bypass 05/22/2017   Postgastrectomy malabsorption 05/22/2017   Obesity, Class III, BMI 40-49.9 (morbid obesity) (HCC) 04/16/2017   Chronic low back pain 04/06/2017   Essential hypertension 04/06/2017   Lumbar disc disease 01/16/2017   Other hyperlipidemia 01/16/2017   Prediabetes 01/16/2017   OSA (obstructive sleep apnea) 01/16/2017   Moderate episode of recurrent major depressive disorder (HCC) 11/09/2016    ONSET DATE: 08/19/2024 (referral date)  REFERRING DIAG: R53.1 (ICD-10-CM) - Weakness R26.89 (ICD-10-CM) - Other abnormalities of gait and mobility D32.9 (ICD-10-CM) - Benign neoplasm of meninges, unspecified  THERAPY DIAG:  Muscle weakness (generalized)  Other abnormalities of gait and mobility  Other lack of coordination  Unsteadiness on feet  Dizziness and giddiness  Meningioma, cerebral (HCC)  Rationale for Evaluation and Treatment: Rehabilitation  SUBJECTIVE:                                                                                                                                                                                             SUBJECTIVE STATEMENT:  Pt reports she is not doing well today, she only slept about an hour last night because she was unable to fall asleep. Pt has been thinking about everything she  is going through, loss of independence, her tumor, etc. She does have a mental health appointment scheduled, had to cancel her initial one because she was sick.  She is going to order a stand alone cane on Thursday. Pt had a near fall backwards last week which scared her because she normally falls forwards, she was able to catch herself on the wall. She continues to not feel comfortable turning to the L, she does not feel stable.   From PT eval: Pt reports that in 2023 she started having seizures, found out she had a tumor in her brain, in 2024 she had brain surgery and had the meningioma removed from the R side of her brain so the L side of her body has been affected. 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 She reports that she continues to have falls, gets dizzy spells, has blurry vision, etc. She reports that her LLE will give out on her at times and can have trouble feeling where her L leg is. She last saw Dr. Gregg on 8/4 and he suggested she start using a cane but she wasn't sure what type to use.  She is unable to stand to cook, has to microwave meals. Currently she is using a shower seat as she is afraid to stand up in the shower. Getting herself cleaned and dressed takes a lot of time, she gets dizzy if she stands up too fast or bends forwards. She currently lives alone but plans to move in with her mom soon.  She has also been having memory problems, her mom has to call her to remind her to take her medications. Educated her that she can set a timer/reminder on her phone for this, her son will help her set it up.   Pt accompanied by: family member aunt [name]; no driving - still having seizures  PERTINENT HISTORY: PMH: GERD, anxiety, depression, seizures, sleep apnea, obesity  PAIN:  Are you having pain? No  PRECAUTIONS: Fall  RED FLAGS: None   WEIGHT BEARING RESTRICTIONS: No  FALLS:  Has patient fallen in last 6 months? Yes. Number of  falls being very cautious so does more stumbling than falls, multiple times per day, reaching out for stuff  LIVING ENVIRONMENT: Lives with: lives alone; plans to move in with her mom Lives in: House/apartment Stairs: No, hard wood floors Has following equipment at home: None  PLOF: Independent with basic ADLs, Independent with gait, Independent with transfers, and Requires assistive device for independence  PATIENT GOALS: gain some strength back into this L side I dont want to be completely immobile  OBJECTIVE:  Note: Objective measures were completed at Evaluation unless otherwise noted.  DIAGNOSTIC FINDINGS:   Brain MRI 12/07/2023 IMPRESSION: 1. Stable to slightly decreased size of residual tumor in the left anterior cranial fossa measuring 6 x 26 x 21 mm. 2. Stable vasogenic edema in the left frontal lobe. 3. No acute intracranial abnormality or significant interval change.  COGNITION: Overall cognitive status: Impaired; has noticed issues with memory, problem-solving, etc.   SENSATION: Has N/T in feet, feet go numb sometimes Impaired proprioception in LLE>RLE Decreased light touch sensation in BLE LLE>RLE  COORDINATION: Impaired in L hemibody  EDEMA:  In LLE>RLE, wears support socks  POSTURE: rounded shoulders, forward head, and posterior pelvic tilt  LOWER EXTREMITY ROM:   WFL  Passive  Right Eval Left Eval  Hip flexion    Hip extension    Hip abduction    Hip adduction    Hip internal rotation    Hip external rotation    Knee flexion    Knee extension    Ankle dorsiflexion    Ankle plantarflexion    Ankle inversion    Ankle eversion     (Blank rows = not tested)  LOWER EXTREMITY MMT:    MMT Right Eval Left Eval  Hip flexion 5 2+  Hip extension    Hip abduction    Hip adduction    Hip internal rotation    Hip external rotation    Knee flexion 5 2+  Knee extension 5 2+  Ankle dorsiflexion 5 2+  Ankle plantarflexion    Ankle inversion     Ankle eversion    (Blank rows = not tested)  BED MOBILITY:  Mod I (sleeps in regular bed, had to pull from mattress, little rough)  TRANSFERS: Sit to stand: Modified independence  Assistive device utilized: None     Stand to sit: Modified independence  Assistive device utilized: None     Chair to chair: Modified independence  Assistive device utilized: None       *needs increased time  RAMP:  Not tested  CURB:  Not tested  STAIRS: Not tested GAIT: Findings:  Gait pattern: decreased hip/knee flexion- Left, decreased ankle dorsiflexion- Left, and genu recurvatum- Left Distance walked: various clinic distances Assistive device utilized: None Level of assistance: SBA Comments: to be further assessed in a functional context   FUNCTIONAL TESTS:       VITALS: Vitals:   10/06/24 1115  BP: 108/74  Pulse: (!) 59  TREATMENT DATE:    Self-Care/Home Management BP assessed in LUE at rest (see above), vitals WNL Pt reports she is feeling a little wonky today and does have onset of dizziness with obstacle course that resolves at rest Discussed setting reminders in her phone for appointments, medications, things she needs to do, etc. Reminded her to setup her SLP evaluation at end of session   Gait Gait pattern: decreased hip/knee flexion- Left and decreased ankle dorsiflexion- Left Distance walked: 460 ft Assistive device utilized: stand alone cane Level of assistance: CGA Comments: x 2 laps to the R, x 2 laps to the L; focus on keeping head up and not looking down at her feet; focus on increasing her confidence with turns to the L  Obstacle course:  Navigating around cones (figure 8), increase in dizziness due to looking down and turns  Navigating over 2 foam beams, does better when she leads with her RLE first  Gait with v/c to turn  around, more difficulty with balance turning to her R as compared to her L. She can benefit from continued practice of this.   PATIENT EDUCATION: Education details: Encouraged her to purchase stand alone cane online, will work more on turns again next visit Person educated: Patient Education method: Programmer, multimedia, Facilities manager, Actor cues, and Verbal cues Education comprehension: verbalized understanding, returned demonstration, and needs further education  HOME EXERCISE PROGRAM: To be initiated  GOALS: Goals reviewed with patient? Yes  SHORT TERM GOALS: Target date: 10/07/2024   Pt will be independent with initial HEP for improved strength, balance, transfers and gait. Baseline: Goal status: INITIAL  2.  Pt will improve 5 x STS to less than or equal to 46 seconds to demonstrate improved functional strength and transfer efficiency.  Baseline: 49.25 sec with BUE (9/23) Goal status: INITIAL  3.  Pt will improve gait velocity to at least 2.75 ft/sec for improved gait efficiency and performance at SBA level  Baseline: 2.47 ft/sec no AD SBA (9/23) Goal status: INITIAL  4.  Pt will improve FGA to 10/30 for decreased fall risk  Baseline: 6/30 (9/23) Goal status: INITIAL  5.  Pt will ambulate greater than or equal to 850 feet on with LRAD and mod I for improved cardiovascular endurance and BLE strength.  Baseline: 600 ft no AD (9/30) Goal status: INITIAL     LONG TERM GOALS: Target date: 10/28/2024   Pt will be independent with final HEP for improved strength, balance, transfers and gait. Baseline:  Goal status: INITIAL  2.  Pt will improve 5 x STS to less than or equal to 43 seconds to demonstrate improved functional strength and transfer efficiency.  Baseline: 49.25 sec with BUE (9/23) Goal status: INITIAL  3.  Pt will improve gait velocity to at least 3.0 ft/sec for improved gait efficiency and performance at mod I level  Baseline: 2.47 ft/sec no AD SBA  (9/23) Goal status: INITIAL  4.  Pt will improve FGA to 14/30 for decreased fall risk  Baseline: 6/30 (9/23) Goal status: INITIAL  5.  Pt will improve normal TUG to less than or equal to 13 seconds for improved functional mobility and decreased fall risk. Baseline: 15.6 sec no AD (9/23) Goal status: INITIAL  6.  Pt will ambulate greater than or equal to 1000 feet on with LRAD and mod I for improved cardiovascular endurance and BLE strength.  Baseline: 600 ft no AD (9/30) Goal status: INITIAL  ASSESSMENT:  CLINICAL IMPRESSION: Emphasis of skilled PT  session on continuing to work on gait training with stand alone cane and working on turns as pt remains uncomfortable with them. She exhibits increased comfort with gait with stand alone cane as session progresses with improved ability to keep her head up and not look down at her feet. She does get dizzy when looking down and navigating through obstacles and continues to have difficulty with turns while walking to her L and 180 degree turns to her R. She continues to benefit from skilled PT services to work on improving her balance and decreasing her fall risk as well as increasing her safety and independence with functional mobility. Continue POC.     OBJECTIVE IMPAIRMENTS: Abnormal gait, cardiopulmonary status limiting activity, decreased activity tolerance, decreased balance, decreased cognition, decreased coordination, decreased endurance, decreased knowledge of use of DME, decreased mobility, difficulty walking, decreased ROM, decreased strength, dizziness, impaired perceived functional ability, impaired sensation, impaired UE functional use, impaired vision/preception, improper body mechanics, postural dysfunction, and pain.   ACTIVITY LIMITATIONS: carrying, lifting, bending, standing, squatting, stairs, transfers, bed mobility, reach over head, hygiene/grooming, and locomotion level  PARTICIPATION LIMITATIONS: meal prep, cleaning,  laundry, medication management, interpersonal relationship, driving, shopping, and community activity  PERSONAL FACTORS: Time since onset of injury/illness/exacerbation and 3+ comorbidities:   GERD, anxiety, depression, seizures, sleep apnea, obesityare also affecting patient's functional outcome.   REHAB POTENTIAL: Good  CLINICAL DECISION MAKING: Unstable/unpredictable  EVALUATION COMPLEXITY: High  PLAN:  PT FREQUENCY: 2x/week  PT DURATION: 6 weeks  PLANNED INTERVENTIONS: 02835- PT Re-evaluation, 97750- Physical Performance Testing, 97110-Therapeutic exercises, 97530- Therapeutic activity, [id number]- Neuromuscular re-education, 97535- Self Care, [id number]- Manual therapy, [phone]- Gait training, [phone]- Orthotic Initial, [phone]- Orthotic/Prosthetic subsequent, [phone]- Aquatic Therapy, Patient/Family education, Balance training, Stair training, Taping, Joint mobilization, Spinal mobilization, Vestibular training, Visual/preceptual remediation/compensation, Cognitive remediation, DME instructions, Cryotherapy, and Moist heat  PLAN FOR NEXT SESSION: assess STG, did we get order for Speech Therapy (yes, did she schedule)? (slurred speech, impaired memory), handout for bedrail, work on balance and LLE strengthening, continue to work with stand alone cane, address R shoulder pain; work on turns with gait to the LEFT and 180 degree turns to the RIGHT   Waddell Southgate, PT Waddell Southgate, PT, DPT, CSRS   10/06/2024, 11:48 AM  For all possible CPT codes, reference the Planned Interventions line above.     Check all conditions that are expected to impact treatment: {Conditions expected to impact treatment:Neurological condition and/or seizures   If treatment provided at initial evaluation, no treatment charged due to lack of authorization.

## 2024-10-06 NOTE — Telephone Encounter (Signed)
 Pt stopped in and wanted Dr. Gregg to write a letter for her disability and emailed to her lawyer: [email or web address]. She wants the letter to state that she should not be working.

## 2024-10-09 ENCOUNTER — Ambulatory Visit: Admitting: Physical Therapy

## 2024-10-09 NOTE — Therapy (Incomplete)
 OUTPATIENT PHYSICAL THERAPY NEURO TREATMENT   Patient Name: Evelyn [name] MRN: [id number] DOB:05-03-1971, 53 y.o., female Today's Date: 10/09/2024   PCP: Valma Lannie DELENA DEVONNA REFERRING PROVIDER: Valma Lannie DELENA, PA-C; Dr. Gregg  END OF SESSION:      Past Medical History:  Diagnosis Date   Anemia    Anxiety    Depression    Family history of breast cancer    Family history of colon cancer    GERD (gastroesophageal reflux disease)    Headache    Obesity    Seizures (HCC)    Sleep apnea    Past Surgical History:  Procedure Laterality Date   ABDOMINAL HYSTERECTOMY     APPLICATION OF CRANIAL NAVIGATION N/A 02/08/2023   Procedure: APPLICATION OF CRANIAL NAVIGATION;  Surgeon: Cheryle Debby DELENA, MD;  Location: [facility] OR;  Service: Neurosurgery;  Laterality: N/A;   BREAST LUMPECTOMY Left 1997   CHOLECYSTECTOMY     CRANIOTOMY Left 02/08/2023   Procedure: Left craniotomy for tumor resection with stealth navigation;  Surgeon: Cheryle Debby DELENA, MD;  Location: [facility];  Service: Neurosurgery;  Laterality: Left;   GASTRIC BYPASS  03/2017   THROAT SURGERY     removed scar tissue from throat d/t being intubated.    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) 03/12/2023   Fever 03/12/2023   Acute pulmonary embolism (HCC) 03/11/2023   Pulmonary embolism on left 03/09/2023   Abdominal pain 03/09/2023   Normocytic anemia 03/09/2023   Adjustment disorder with anxious mood 02/23/2023   Adjustment reaction with anxiety and depression 02/23/2023   Atypical intracranial meningioma (HCC) 02/22/2023   Meningioma, cerebral  (HCC) 02/15/2023   Brain tumor (HCC) 02/08/2023   Boil 03/15/2022   Patellofemoral arthritis of left knee 02/08/2018   Thiamine deficiency 10/23/2017   Vitamin D  deficiency 10/23/2017   Anxiety 08/09/2017   H/O gastric bypass 05/22/2017   Postgastrectomy malabsorption 05/22/2017   Obesity, Class III, BMI 40-49.9 (morbid obesity) (HCC) 04/16/2017   Chronic low back pain 04/06/2017   Essential hypertension 04/06/2017   Lumbar disc disease 01/16/2017   Other hyperlipidemia 01/16/2017   Prediabetes 01/16/2017   OSA (obstructive sleep apnea) 01/16/2017   Moderate episode of recurrent major depressive disorder (HCC) 11/09/2016    ONSET DATE: 08/19/2024 (referral date)  REFERRING DIAG: R53.1 (ICD-10-CM) - Weakness R26.89 (ICD-10-CM) - Other abnormalities of gait and mobility D32.9 (ICD-10-CM) - Benign neoplasm of meninges, unspecified  THERAPY DIAG:  No diagnosis found.  Rationale for Evaluation and Treatment: Rehabilitation  SUBJECTIVE:  SUBJECTIVE STATEMENT:  Pt reports she is not doing well today, she only slept about an hour last night because she was unable to fall asleep. Pt has been thinking about everything she is going through, loss of independence, her tumor, etc. She does have a mental health appointment scheduled, had to cancel her initial one because she was sick.  She is going to order a stand alone cane on Thursday. Pt had a near fall backwards last week which scared her because she normally falls forwards, she was able to catch herself on the wall. She continues to not feel comfortable turning to the L, she does not feel stable.  ***   From PT eval: Pt reports that in 2023 she started having seizures, found out she had a tumor in her brain, in 2024 she had brain surgery and had the  meningioma removed from the R side of her brain so the L side of her body has been affected. 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 She reports that she continues to have falls, gets dizzy spells, has blurry vision, etc. She reports that her LLE will give out on her at times and can have trouble feeling where her L leg is. She last saw Dr. Gregg on 8/4 and he suggested she start using a cane but she wasn't sure what type to use.  She is unable to stand to cook, has to microwave meals. Currently she is using a shower seat as she is afraid to stand up in the shower. Getting herself cleaned and dressed takes a lot of time, she gets dizzy if she stands up too fast or bends forwards. She currently lives alone but plans to move in with her mom soon.  She has also been having memory problems, her mom has to call her to remind her to take her medications. Educated her that she can set a timer/reminder on her phone for this, her son will help her set it up.   Pt accompanied by: family member aunt [name]; no driving - still having seizures  PERTINENT HISTORY: PMH: GERD, anxiety, depression, seizures, sleep apnea, obesity  PAIN:  Are you having pain? No  PRECAUTIONS: Fall  RED FLAGS: None   WEIGHT BEARING RESTRICTIONS: No  FALLS: Has patient fallen in last 6 months? Yes. Number of falls being very cautious so does more stumbling than falls, multiple times per day, reaching out for stuff  LIVING ENVIRONMENT: Lives with: lives alone; plans to move in with her mom Lives in: House/apartment Stairs: No, hard wood floors Has following equipment at home: None  PLOF: Independent with basic ADLs, Independent with gait, Independent with transfers, and Requires assistive device for independence  PATIENT GOALS: gain some strength back into this L side I dont want to be completely immobile  OBJECTIVE:  Note: Objective measures were completed at Evaluation  unless otherwise noted.  DIAGNOSTIC FINDINGS:   Brain MRI 12/07/2023 IMPRESSION: 1. Stable to slightly decreased size of residual tumor in the left anterior cranial fossa measuring 6 x 26 x 21 mm. 2. Stable vasogenic edema in the left frontal lobe. 3. No acute intracranial abnormality or significant interval change.  COGNITION: Overall cognitive status: Impaired; has noticed issues with memory, problem-solving, etc.   SENSATION: Has N/T in feet, feet go numb sometimes Impaired proprioception in LLE>RLE Decreased light touch sensation in BLE LLE>RLE  COORDINATION: Impaired in L hemibody  EDEMA:  In LLE>RLE, wears  support socks  POSTURE: rounded shoulders, forward head, and posterior pelvic tilt  LOWER EXTREMITY ROM:   WFL  Passive  Right Eval Left Eval  Hip flexion    Hip extension    Hip abduction    Hip adduction    Hip internal rotation    Hip external rotation    Knee flexion    Knee extension    Ankle dorsiflexion    Ankle plantarflexion    Ankle inversion    Ankle eversion     (Blank rows = not tested)  LOWER EXTREMITY MMT:    MMT Right Eval Left Eval  Hip flexion 5 2+  Hip extension    Hip abduction    Hip adduction    Hip internal rotation    Hip external rotation    Knee flexion 5 2+  Knee extension 5 2+  Ankle dorsiflexion 5 2+  Ankle plantarflexion    Ankle inversion    Ankle eversion    (Blank rows = not tested)  BED MOBILITY:  Mod I (sleeps in regular bed, had to pull from mattress, little rough)  TRANSFERS: Sit to stand: Modified independence  Assistive device utilized: None     Stand to sit: Modified independence  Assistive device utilized: None     Chair to chair: Modified independence  Assistive device utilized: None       *needs increased time  RAMP:  Not tested  CURB:  Not tested  STAIRS: Not tested GAIT: Findings:  Gait pattern: decreased hip/knee flexion- Left, decreased ankle dorsiflexion- Left, and genu  recurvatum- Left Distance walked: various clinic distances Assistive device utilized: None Level of assistance: SBA Comments: to be further assessed in a functional context   FUNCTIONAL TESTS:       VITALS: There were no vitals filed for this visit.                                                                                                                                TREATMENT DATE:    Self-Care/Home Management BP assessed in LUE at rest (see above), vitals WNL Pt reports she is feeling a little wonky today and does have onset of dizziness with obstacle course that resolves at rest Discussed setting reminders in her phone for appointments, medications, things she needs to do, etc. Reminded her to setup her SLP evaluation at end of session ***   Gait Gait pattern: decreased hip/knee flexion- Left and decreased ankle dorsiflexion- Left Distance walked: 460 ft Assistive device utilized: stand alone cane Level of assistance: CGA Comments: x 2 laps to the R, x 2 laps to the L; focus on keeping head up and not looking down at her feet; focus on increasing her confidence with turns to the L  Obstacle course:  Navigating around cones (figure 8), increase in dizziness due to looking down and turns  Navigating over 2 foam beams, does better when she leads with  her RLE first  Gait with v/c to turn around, more difficulty with balance turning to her R as compared to her L. She can benefit from continued practice of this.   Physical Performance ***   PATIENT EDUCATION: Education details: Encouraged her to purchase stand alone cane online, will work more on turns again next visit*** Person educated: Patient Education method: Programmer, multimedia, Facilities manager, Actor cues, and Verbal cues Education comprehension: verbalized understanding, returned demonstration, and needs further education  HOME EXERCISE PROGRAM: To be initiated  GOALS: Goals reviewed with patient?  Yes  SHORT TERM GOALS: Target date: 10/07/2024***   Pt will be independent with initial HEP for improved strength, balance, transfers and gait. Baseline: Goal status: INITIAL  2.  Pt will improve 5 x STS to less than or equal to 46 seconds to demonstrate improved functional strength and transfer efficiency.  Baseline: 49.25 sec with BUE (9/23) Goal status: INITIAL  3.  Pt will improve gait velocity to at least 2.75 ft/sec for improved gait efficiency and performance at SBA level  Baseline: 2.47 ft/sec no AD SBA (9/23) Goal status: INITIAL  4.  Pt will improve FGA to 10/30 for decreased fall risk  Baseline: 6/30 (9/23) Goal status: INITIAL  5.  Pt will ambulate greater than or equal to 850 feet on with LRAD and mod I for improved cardiovascular endurance and BLE strength.  Baseline: 600 ft no AD (9/30) Goal status: INITIAL     LONG TERM GOALS: Target date: 10/28/2024   Pt will be independent with final HEP for improved strength, balance, transfers and gait. Baseline:  Goal status: INITIAL  2.  Pt will improve 5 x STS to less than or equal to 43 seconds to demonstrate improved functional strength and transfer efficiency.  Baseline: 49.25 sec with BUE (9/23) Goal status: INITIAL  3.  Pt will improve gait velocity to at least 3.0 ft/sec for improved gait efficiency and performance at mod I level  Baseline: 2.47 ft/sec no AD SBA (9/23) Goal status: INITIAL  4.  Pt will improve FGA to 14/30 for decreased fall risk  Baseline: 6/30 (9/23) Goal status: INITIAL  5.  Pt will improve normal TUG to less than or equal to 13 seconds for improved functional mobility and decreased fall risk. Baseline: 15.6 sec no AD (9/23) Goal status: INITIAL  6.  Pt will ambulate greater than or equal to 1000 feet on with LRAD and mod I for improved cardiovascular endurance and BLE strength.  Baseline: 600 ft no AD (9/30) Goal status: INITIAL  ASSESSMENT:  CLINICAL  IMPRESSION: Emphasis of skilled PT session on*** continuing to work on gait training with stand alone cane and working on turns as pt remains uncomfortable with them. She exhibits increased comfort with gait with stand alone cane as session progresses with improved ability to keep her head up and not look down at her feet. She does get dizzy when looking down and navigating through obstacles and continues to have difficulty with turns while walking to her L and 180 degree turns to her R. She continues to benefit from skilled PT services to work on improving her balance and decreasing her fall risk as well as increasing her safety and independence with functional mobility. Continue POC.     OBJECTIVE IMPAIRMENTS: Abnormal gait, cardiopulmonary status limiting activity, decreased activity tolerance, decreased balance, decreased cognition, decreased coordination, decreased endurance, decreased knowledge of use of DME, decreased mobility, difficulty walking, decreased ROM, decreased strength, dizziness, impaired perceived functional  ability, impaired sensation, impaired UE functional use, impaired vision/preception, improper body mechanics, postural dysfunction, and pain.   ACTIVITY LIMITATIONS: carrying, lifting, bending, standing, squatting, stairs, transfers, bed mobility, reach over head, hygiene/grooming, and locomotion level  PARTICIPATION LIMITATIONS: meal prep, cleaning, laundry, medication management, interpersonal relationship, driving, shopping, and community activity  PERSONAL FACTORS: Time since onset of injury/illness/exacerbation and 3+ comorbidities:   GERD, anxiety, depression, seizures, sleep apnea, obesityare also affecting patient's functional outcome.   REHAB POTENTIAL: Good  CLINICAL DECISION MAKING: Unstable/unpredictable  EVALUATION COMPLEXITY: High  PLAN:  PT FREQUENCY: 2x/week  PT DURATION: 6 weeks  PLANNED INTERVENTIONS: 02835- PT Re-evaluation, 97750- Physical  Performance Testing, 97110-Therapeutic exercises, 97530- Therapeutic activity, [id number]- Neuromuscular re-education, 97535- Self Care, 02859- Manual therapy, [phone]- Gait training, [phone]- Orthotic Initial, [phone]- Orthotic/Prosthetic subsequent, [phone]- Aquatic Therapy, Patient/Family education, Balance training, Stair training, Taping, Joint mobilization, Spinal mobilization, Vestibular training, Visual/preceptual remediation/compensation, Cognitive remediation, DME instructions, Cryotherapy, and Moist heat  PLAN FOR NEXT SESSION: assess STG, did we get order for Speech Therapy (yes, did she schedule)? (slurred speech, impaired memory), handout for bedrail, work on balance and LLE strengthening, continue to work with stand alone cane, address R shoulder pain; work on turns with gait to the LEFT and 180 degree turns to the RIGHT***   Waddell Southgate, PT Waddell Southgate, PT, DPT, CSRS   10/09/2024, 7:50 AM  For all possible CPT codes, reference the Planned Interventions line above.     Check all conditions that are expected to impact treatment: {Conditions expected to impact treatment:Neurological condition and/or seizures   If treatment provided at initial evaluation, no treatment charged due to lack of authorization.

## 2024-10-14 ENCOUNTER — Ambulatory Visit: Admitting: Physical Therapy

## 2024-10-14 VITALS — BP 107/71 | HR 61

## 2024-10-14 DIAGNOSIS — M6281 Muscle weakness (generalized): Secondary | ICD-10-CM

## 2024-10-14 DIAGNOSIS — R2681 Unsteadiness on feet: Secondary | ICD-10-CM

## 2024-10-14 DIAGNOSIS — R2689 Other abnormalities of gait and mobility: Secondary | ICD-10-CM

## 2024-10-14 DIAGNOSIS — R42 Dizziness and giddiness: Secondary | ICD-10-CM

## 2024-10-14 DIAGNOSIS — R278 Other lack of coordination: Secondary | ICD-10-CM

## 2024-10-14 NOTE — Therapy (Signed)
 OUTPATIENT PHYSICAL THERAPY NEURO TREATMENT   Patient Name: Evelyn [name] MRN: [id number] DOB:[birth year], 52 y.o., female Today's Date: 10/14/2024   PCP: Valma Lannie DELENA DEVONNA REFERRING PROVIDER: Valma Lannie DELENA, PA-C; Dr. Gregg  END OF SESSION:  PT End of Session - 10/14/24 1102     Visit Number 5    Number of Visits 13   with eval   Date for Recertification  11/11/24    Authorization Type [employer]    PT Start Time 1100    PT Stop Time 1145    PT Time Calculation (min) 45 min    Equipment Utilized During Treatment Gait belt    Activity Tolerance Patient tolerated treatment well    Behavior During Therapy WFL for tasks assessed/performed;Anxious             Past Medical History:  Diagnosis Date   Anemia    Anxiety    Depression    Family history of breast cancer    Family history of colon cancer    GERD (gastroesophageal reflux disease)    Headache    Obesity    Seizures (HCC)    Sleep apnea    Past Surgical History:  Procedure Laterality Date   ABDOMINAL HYSTERECTOMY     APPLICATION OF CRANIAL NAVIGATION N/A 02/08/2023   Procedure: APPLICATION OF CRANIAL NAVIGATION;  Surgeon: Cheryle Debby DELENA, MD;  Location: [facility] OR;  Service: Neurosurgery;  Laterality: N/A;   BREAST LUMPECTOMY Left 1997   CHOLECYSTECTOMY     CRANIOTOMY Left 02/08/2023   Procedure: Left craniotomy for tumor resection with stealth navigation;  Surgeon: Cheryle Debby DELENA, MD;  Location: [facility] OR;  Service: Neurosurgery;  Laterality: Left;   GASTRIC BYPASS  03/2017   THROAT SURGERY     removed scar tissue from throat d/t being intubated.    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) 03/12/2023   Fever 03/12/2023   Acute pulmonary embolism (HCC) 03/11/2023   Pulmonary embolism on left 03/09/2023   Abdominal pain 03/09/2023   Normocytic anemia 03/09/2023   Adjustment disorder with anxious mood 02/23/2023   Adjustment reaction with anxiety and depression 02/23/2023   Atypical intracranial meningioma (HCC) 02/22/2023   Meningioma, cerebral (HCC) 02/15/2023   Brain tumor (HCC) 02/08/2023   Boil 03/15/2022   Patellofemoral arthritis of left knee 02/08/2018   Thiamine deficiency 10/23/2017   Vitamin D  deficiency 10/23/2017   Anxiety 08/09/2017   H/O gastric bypass 05/22/2017   Postgastrectomy malabsorption 05/22/2017   Obesity, Class III, BMI 40-49.9 (morbid obesity) (HCC) 04/16/2017   Chronic low back pain 04/06/2017   Essential hypertension 04/06/2017   Lumbar disc disease 01/16/2017   Other hyperlipidemia 01/16/2017   Prediabetes 01/16/2017   OSA (obstructive sleep apnea) 01/16/2017   Moderate episode of recurrent major depressive disorder (HCC) 11/09/2016    ONSET DATE: 08/19/2024 (referral date)  REFERRING DIAG: R53.1 (ICD-10-CM) - Weakness R26.89 (ICD-10-CM) - Other abnormalities of gait and mobility D32.9 (ICD-10-CM) - Benign neoplasm of meninges, unspecified  THERAPY DIAG:  Muscle weakness (generalized)  Other abnormalities of gait and mobility  Unsteadiness on feet  Dizziness and giddiness  Other lack of coordination  Rationale for Evaluation and Treatment: Rehabilitation  SUBJECTIVE:                                                                                                                                                                                             SUBJECTIVE STATEMENT:  Pt is feeling very sore today, went to the [place] over the weekend. She really overdid it. Pt had an episode of vertigo and had a near fall, was able to catch herself.   From PT  eval: Pt reports that in 2023 she started having seizures, found out she had a tumor in her brain, in 2024 she had brain surgery and had the meningioma removed from the R side of her brain so the L side of her body has been affected. 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 She reports that she continues to have falls, gets dizzy spells, has blurry vision, etc. She reports that her LLE will give out on her at times and can have trouble feeling where her L leg is. She last saw Dr. Gregg on 8/4 and he suggested she start using a cane but she wasn't sure what type to use.  She is unable to stand to cook, has to microwave meals. Currently she is using a shower seat as she is afraid to stand up in the shower. Getting herself cleaned and dressed takes a lot of time, she gets dizzy if she stands up too fast or bends forwards. She currently lives alone but plans to move in with her mom soon.  She has also been having memory problems, her mom has to call her to remind her to take her medications. Educated her that she can set a timer/reminder on her phone for this, her son will help her set it up.   Pt accompanied by: family member aunt [name]; no driving - still having seizures  PERTINENT HISTORY: PMH: GERD, anxiety, depression, seizures, sleep apnea, obesity  PAIN:  Are you having pain? No  PRECAUTIONS: Fall  RED FLAGS: None   WEIGHT BEARING RESTRICTIONS: No  FALLS: Has patient fallen in last 6 months? Yes. Number of falls being very cautious so does more stumbling than falls, multiple times per day, reaching out for stuff  LIVING ENVIRONMENT: Lives with: lives alone; plans to move in with her mom Lives in: House/apartment Stairs: No, hard wood floors Has following equipment at home: None  PLOF: Independent with basic ADLs, Independent with gait, Independent with transfers, and Requires assistive device for independence  PATIENT GOALS: gain  some  strength back into this L side I dont want to be completely immobile  OBJECTIVE:  Note: Objective measures were completed at Evaluation unless otherwise noted.  DIAGNOSTIC FINDINGS:   Brain MRI 12/07/2023 IMPRESSION: 1. Stable to slightly decreased size of residual tumor in the left anterior cranial fossa measuring 6 x 26 x 21 mm. 2. Stable vasogenic edema in the left frontal lobe. 3. No acute intracranial abnormality or significant interval change.  COGNITION: Overall cognitive status: Impaired; has noticed issues with memory, problem-solving, etc.   SENSATION: Has N/T in feet, feet go numb sometimes Impaired proprioception in LLE>RLE Decreased light touch sensation in BLE LLE>RLE  COORDINATION: Impaired in L hemibody  EDEMA:  In LLE>RLE, wears support socks  POSTURE: rounded shoulders, forward head, and posterior pelvic tilt  LOWER EXTREMITY ROM:   WFL  Passive  Right Eval Left Eval  Hip flexion    Hip extension    Hip abduction    Hip adduction    Hip internal rotation    Hip external rotation    Knee flexion    Knee extension    Ankle dorsiflexion    Ankle plantarflexion    Ankle inversion    Ankle eversion     (Blank rows = not tested)  LOWER EXTREMITY MMT:    MMT Right Eval Left Eval  Hip flexion 5 2+  Hip extension    Hip abduction    Hip adduction    Hip internal rotation    Hip external rotation    Knee flexion 5 2+  Knee extension 5 2+  Ankle dorsiflexion 5 2+  Ankle plantarflexion    Ankle inversion    Ankle eversion    (Blank rows = not tested)  BED MOBILITY:  Mod I (sleeps in regular bed, had to pull from mattress, little rough)  TRANSFERS: Sit to stand: Modified independence  Assistive device utilized: None     Stand to sit: Modified independence  Assistive device utilized: None     Chair to chair: Modified independence  Assistive device utilized: None       *needs increased time  RAMP:  Not tested  CURB:  Not  tested  STAIRS: Not tested GAIT: Findings:  Gait pattern: decreased hip/knee flexion- Left, decreased ankle dorsiflexion- Left, and genu recurvatum- Left Distance walked: various clinic distances Assistive device utilized: None Level of assistance: SBA Comments: to be further assessed in a functional context   FUNCTIONAL TESTS:     VITALS: Vitals:   10/14/24 1112  BP: 107/71  Pulse: 61                                                                                                                                  TREATMENT DATE:    Self-Care/Home Management BP assessed in LUE at rest (see above), vitals WNL  TherAct SciFit constant resistance level 1 for 8 minutes using BUE/BLEs for neural priming  for reciprocal movement, dynamic cardiovascular warmup and increased amplitude of stepping.    Physical Performance For STG assessment:  [facility] PT Assessment - 10/14/24 1115       Ambulation/Gait   Gait velocity 32.8 ft over 17.8 sec = 1.84 f/tsec      Standardized Balance Assessment   Standardized Balance Assessment Timed Up and Go Test;Five Times Sit to Stand    Five times sit to stand comments  27.62 sec   needs BUE support     Functional Gait  Assessment   Gait assessed  Yes    Gait Level Surface Walks 20 ft, slow speed, abnormal gait pattern, evidence for imbalance or deviates 10-15 in outside of the 12 in walkway width. Requires more than 7 sec to ambulate 20 ft.    Change in Gait Speed Makes only minor adjustments to walking speed, or accomplishes a change in speed with significant gait deviations, deviates 10-15 in outside the 12 in walkway width, or changes speed but loses balance but is able to recover and continue walking.    Gait with Horizontal Head Turns Performs head turns with moderate changes in gait velocity, slows down, deviates 10-15 in outside 12 in walkway width but recovers, can continue to walk.    Gait with Vertical Head Turns Performs task with  moderate change in gait velocity, slows down, deviates 10-15 in outside 12 in walkway width but recovers, can continue to walk.    Gait and Pivot Turn Turns slowly, requires verbal cueing, or requires several small steps to catch balance following turn and stop    Step Over Obstacle Is able to step over one shoe box (4.5 in total height) but must slow down and adjust steps to clear box safely. May require verbal cueing.    Gait with Narrow Base of Support Ambulates 4-7 steps.    Gait with Eyes Closed Walks 20 ft, slow speed, abnormal gait pattern, evidence for imbalance, deviates 10-15 in outside 12 in walkway width. Requires more than 9 sec to ambulate 20 ft.    Ambulating Backwards Walks 20 ft, slow speed, abnormal gait pattern, evidence for imbalance, deviates 10-15 in outside 12 in walkway width.    Steps Two feet to a stair, must use rail.    Total Score 10    FGA comment: 10/30, high fall risk            PATIENT EDUCATION: Education details: results of OM and functional implications, importance of setting boundaries with family in terms of her physical abilities Person educated: Patient Education method: Explanation, Demonstration, Tactile cues, and Verbal cues Education comprehension: verbalized understanding, returned demonstration, and needs further education  HOME EXERCISE PROGRAM: To be initiated  GOALS: Goals reviewed with patient? Yes  SHORT TERM GOALS: Target date: 10/07/2024   Pt will be independent with initial HEP for improved strength, balance, transfers and gait. Baseline: not yet established Goal status: IN PROGRESS  2.  Pt will improve 5 x STS to less than or equal to 46 seconds to demonstrate improved functional strength and transfer efficiency.  Baseline: 49.25 sec with BUE (9/23), 27.62 sec BUE (10/21) Goal status: MET  3.  Pt will improve gait velocity to at least 2.75 ft/sec for improved gait efficiency and performance at SBA level  Baseline: 2.47  ft/sec no AD SBA (9/23), 1.84 ft/sec no AD SBA (10/21) Goal status: NOT MET  4.  Pt will improve FGA to 10/30 for decreased fall risk  Baseline: 6/30 (9/23),  10/30 (10/21) Goal status: MET  5.  Pt will ambulate greater than or equal to 850 feet on with LRAD and mod I for improved cardiovascular endurance and BLE strength.  Baseline: 600 ft no AD (9/30) Goal status: INITIAL     LONG TERM GOALS: Target date: 10/28/2024   Pt will be independent with final HEP for improved strength, balance, transfers and gait. Baseline:  Goal status: INITIAL  2.  Pt will improve 5 x STS to less than or equal to 24 seconds to demonstrate improved functional strength and transfer efficiency.  Baseline: 49.25 sec with BUE (9/23), 27.62 sec BUE (10/21) Goal status: REVISED/UPGRADED  3.  Pt will improve gait velocity to at least 2.75 ft/sec for improved gait efficiency and performance at mod I level  Baseline: 2.47 ft/sec no AD SBA (9/23), 1.84 ft/sec no AD SBA (10/21) Goal status: REVISED/DOWNGRADED  4.  Pt will improve FGA to 14/30 for decreased fall risk  Baseline: 6/30 (9/23), 10/30 (10/21) Goal status: INITIAL  5.  Pt will improve normal TUG to less than or equal to 13 seconds for improved functional mobility and decreased fall risk. Baseline: 15.6 sec no AD (9/23) Goal status: INITIAL  6.  Pt will ambulate greater than or equal to 1000 feet on with LRAD and mod I for improved cardiovascular endurance and BLE strength.  Baseline: 600 ft no AD (9/30) Goal status: INITIAL  ASSESSMENT:  CLINICAL IMPRESSION: Emphasis of skilled PT session on initiating assessment of STG and working on global endurance training. Pt having a rough day as she spent the weekend at the [facility] with family and was really pushed to her physical limit, is still recovering from this. Discussed pt being comfortable setting boundaries with her family in terms of her physical abilities so that she does not push  herself this far again. Pt has met 2/5 STG assessed this date due to significantly improving her 5xSTS score and improving her FGA score. Her gait speed was slower this visit as compared to initial eval, HEP goal was not met as it was not yet established, and will assess next visit due to pain and fatigue this visit. Overall pt has shown some progress since starting therapy and continues to benefit from skilled PT services to work towards improving her balance and decreasing her fall risk as well as increasing her safety and independence with functional mobility. Continue POC.     OBJECTIVE IMPAIRMENTS: Abnormal gait, cardiopulmonary status limiting activity, decreased activity tolerance, decreased balance, decreased cognition, decreased coordination, decreased endurance, decreased knowledge of use of DME, decreased mobility, difficulty walking, decreased ROM, decreased strength, dizziness, impaired perceived functional ability, impaired sensation, impaired UE functional use, impaired vision/preception, improper body mechanics, postural dysfunction, and pain.   ACTIVITY LIMITATIONS: carrying, lifting, bending, standing, squatting, stairs, transfers, bed mobility, reach over head, hygiene/grooming, and locomotion level  PARTICIPATION LIMITATIONS: meal prep, cleaning, laundry, medication management, interpersonal relationship, driving, shopping, and community activity  PERSONAL FACTORS: Time since onset of injury/illness/exacerbation and 3+ comorbidities:   GERD, anxiety, depression, seizures, sleep apnea, obesityare also affecting patient's functional outcome.   REHAB POTENTIAL: Good  CLINICAL DECISION MAKING: Unstable/unpredictable  EVALUATION COMPLEXITY: High  PLAN:  PT FREQUENCY: 2x/week  PT DURATION: 6 weeks  PLANNED INTERVENTIONS: [id number]- PT Re-evaluation, 97750- Physical Performance Testing, 97110-Therapeutic exercises, 97530- Therapeutic activity, [id number]- Neuromuscular re-education,  97535- Self Care, [id number]- Manual therapy, [id number]- Gait training, [phone]- Orthotic Initial, [phone]- Orthotic/Prosthetic subsequent, [id number]- Aquatic Therapy, Patient/Family education, Balance  training, Stair training, Taping, Joint mobilization, Spinal mobilization, Vestibular training, Visual/preceptual remediation/compensation, Cognitive remediation, DME instructions, Cryotherapy, and Moist heat  PLAN FOR NEXT SESSION: assess STG-6MWT, did we get order for Speech Therapy (yes, did she schedule)? (slurred speech, impaired memory), handout for bedrail, work on balance and LLE strengthening, continue to work with stand alone cane, address R shoulder pain; work on turns with gait to the LEFT and 180 degree turns to the RIGHT   Waddell Southgate, PT Waddell Southgate, PT, DPT, CSRS   10/14/2024, 11:45 AM  For all possible CPT codes, reference the Planned Interventions line above.     Check all conditions that are expected to impact treatment: {Conditions expected to impact treatment:Neurological condition and/or seizures   If treatment provided at initial evaluation, no treatment charged due to lack of authorization.

## 2024-10-16 ENCOUNTER — Telehealth: Payer: Self-pay | Admitting: *Deleted

## 2024-10-16 ENCOUNTER — Ambulatory Visit: Admitting: Physical Therapy

## 2024-10-16 VITALS — BP 103/70 | HR 65

## 2024-10-16 DIAGNOSIS — R2681 Unsteadiness on feet: Secondary | ICD-10-CM

## 2024-10-16 DIAGNOSIS — R2689 Other abnormalities of gait and mobility: Secondary | ICD-10-CM

## 2024-10-16 DIAGNOSIS — M6281 Muscle weakness (generalized): Secondary | ICD-10-CM

## 2024-10-16 NOTE — Therapy (Signed)
 OUTPATIENT PHYSICAL THERAPY NEURO TREATMENT   Patient Name: Evelyn [name] MRN: [id number] DOB:1971-02-02, 53 y.o., female Today's Date: 10/16/2024   PCP: Valma Lannie DELENA DEVONNA REFERRING PROVIDER: Valma Lannie DELENA, PA-C; Dr. Gregg  END OF SESSION:  PT End of Session - 10/16/24 1103     Visit Number 6    Number of Visits 13   with eval   Date for Recertification  11/11/24    Authorization Type [employer]    PT Start Time 1102    PT Stop Time 1142    PT Time Calculation (min) 40 min    Equipment Utilized During Treatment --    Activity Tolerance Patient tolerated treatment well;Patient limited by pain;Patient limited by fatigue    Behavior During Therapy [facility] for tasks assessed/performed;Anxious              Past Medical History:  Diagnosis Date   Anemia    Anxiety    Depression    Family history of breast cancer    Family history of colon cancer    GERD (gastroesophageal reflux disease)    Headache    Obesity    Seizures (HCC)    Sleep apnea    Past Surgical History:  Procedure Laterality Date   ABDOMINAL HYSTERECTOMY     APPLICATION OF CRANIAL NAVIGATION N/A 02/08/2023   Procedure: APPLICATION OF CRANIAL NAVIGATION;  Surgeon: Cheryle Debby DELENA, MD;  Location: [facility] OR;  Service: Neurosurgery;  Laterality: N/A;   BREAST LUMPECTOMY Left 1997   CHOLECYSTECTOMY     CRANIOTOMY Left 02/08/2023   Procedure: Left craniotomy for tumor resection with stealth navigation;  Surgeon: Cheryle Debby DELENA, MD;  Location: [facility] OR;  Service: Neurosurgery;  Laterality: Left;   GASTRIC BYPASS  03/2017   THROAT SURGERY     removed scar tissue from throat d/t being intubated.    TUBAL LIGATION     Patient Active Problem List   Diagnosis Date Noted   Genetic testing 04/30/2023   Spinal stenosis, lumbar region, with neurogenic claudication 04/30/2023   Insomnia 04/30/2023   Family history of breast cancer 04/10/2023   Family history of colon cancer 04/10/2023   Fluid  collection at surgical site 03/15/2023   Hematoma of left cerebral hemisphere with open cranial wound (HCC) 03/12/2023   Fever 03/12/2023   Acute pulmonary embolism (HCC) 03/11/2023   Pulmonary embolism on left 03/09/2023   Abdominal pain 03/09/2023   Normocytic anemia 03/09/2023   Adjustment disorder with anxious mood 02/23/2023   Adjustment reaction with anxiety and depression 02/23/2023   Atypical intracranial meningioma (HCC) 02/22/2023   Meningioma, cerebral (HCC) 02/15/2023   Brain tumor (HCC) 02/08/2023   Boil 03/15/2022   Patellofemoral arthritis of left knee 02/08/2018   Thiamine deficiency 10/23/2017   Vitamin D  deficiency 10/23/2017   Anxiety 08/09/2017   H/O gastric bypass 05/22/2017   Postgastrectomy malabsorption 05/22/2017   Obesity, Class III, BMI 40-49.9 (morbid obesity) (HCC) 04/16/2017   Chronic low back pain 04/06/2017   Essential hypertension 04/06/2017   Lumbar disc disease 01/16/2017   Other hyperlipidemia 01/16/2017   Prediabetes 01/16/2017   OSA (obstructive sleep apnea) 01/16/2017   Moderate episode of recurrent major depressive disorder (HCC) 11/09/2016    ONSET DATE: 08/19/2024 (referral date)  REFERRING DIAG: R53.1 (ICD-10-CM) - Weakness R26.89 (ICD-10-CM) - Other abnormalities of gait and mobility D32.9 (ICD-10-CM) - Benign neoplasm of meninges, unspecified  THERAPY DIAG:  Muscle weakness (generalized)  Other abnormalities of gait and mobility  Unsteadiness  on feet  Rationale for Evaluation and Treatment: Rehabilitation  SUBJECTIVE:                                                                                                                                                                                             SUBJECTIVE STATEMENT:  Pt reports is has been a rough week, had a set back this weekend that triggered her vertigo and she still feels sore and tired. No falls.    From PT eval: Pt reports that in 2023 she started having  seizures, found out she had a tumor in her brain, in 2024 she had brain surgery and had the meningioma removed from the R side of her brain so the L side of her body has been affected. 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 She reports that she continues to have falls, gets dizzy spells, has blurry vision, etc. She reports that her LLE will give out on her at times and can have trouble feeling where her L leg is. She last saw Dr. Gregg on 8/4 and he suggested she start using a cane but she wasn't sure what type to use.  She is unable to stand to cook, has to microwave meals. Currently she is using a shower seat as she is afraid to stand up in the shower. Getting herself cleaned and dressed takes a lot of time, she gets dizzy if she stands up too fast or bends forwards. She currently lives alone but plans to move in with her mom soon.  She has also been having memory problems, her mom has to call her to remind her to take her medications. Educated her that she can set a timer/reminder on her phone for this, her son will help her set it up.   Pt accompanied by: family member aunt [name]; no driving - still having seizures  PERTINENT HISTORY: PMH: GERD, anxiety, depression, seizures, sleep apnea, obesity  PAIN:  Are you having pain? No  PRECAUTIONS: Fall  RED FLAGS: None   WEIGHT BEARING RESTRICTIONS: No  FALLS: Has patient fallen in last 6 months? Yes. Number of falls being very cautious so does more stumbling than falls, multiple times per day, reaching out for stuff  LIVING ENVIRONMENT: Lives with: lives alone; plans to move in with her mom Lives in: House/apartment Stairs: No, hard wood floors Has following equipment at home: None  PLOF: Independent with basic ADLs, Independent with gait, Independent with transfers, and Requires assistive device for independence  PATIENT GOALS: gain some strength back into this L side I  dont want to be  completely immobile  OBJECTIVE:  Note: Objective measures were completed at Evaluation unless otherwise noted.  DIAGNOSTIC FINDINGS:   Brain MRI 12/07/2023 IMPRESSION: 1. Stable to slightly decreased size of residual tumor in the left anterior cranial fossa measuring 6 x 26 x 21 mm. 2. Stable vasogenic edema in the left frontal lobe. 3. No acute intracranial abnormality or significant interval change.  COGNITION: Overall cognitive status: Impaired; has noticed issues with memory, problem-solving, etc.   SENSATION: Has N/T in feet, feet go numb sometimes Impaired proprioception in LLE>RLE Decreased light touch sensation in BLE LLE>RLE  COORDINATION: Impaired in L hemibody  EDEMA:  In LLE>RLE, wears support socks  POSTURE: rounded shoulders, forward head, and posterior pelvic tilt  LOWER EXTREMITY ROM:   WFL  Passive  Right Eval Left Eval  Hip flexion    Hip extension    Hip abduction    Hip adduction    Hip internal rotation    Hip external rotation    Knee flexion    Knee extension    Ankle dorsiflexion    Ankle plantarflexion    Ankle inversion    Ankle eversion     (Blank rows = not tested)  LOWER EXTREMITY MMT:    MMT Right Eval Left Eval  Hip flexion 5 2+  Hip extension    Hip abduction    Hip adduction    Hip internal rotation    Hip external rotation    Knee flexion 5 2+  Knee extension 5 2+  Ankle dorsiflexion 5 2+  Ankle plantarflexion    Ankle inversion    Ankle eversion    (Blank rows = not tested)  BED MOBILITY:  Mod I (sleeps in regular bed, had to pull from mattress, little rough)  TRANSFERS: Sit to stand: Modified independence  Assistive device utilized: None     Stand to sit: Modified independence  Assistive device utilized: None     Chair to chair: Modified independence  Assistive device utilized: None       *needs increased time  RAMP:  Not tested  CURB:  Not tested  STAIRS: Not tested GAIT: Findings:  Gait  pattern: decreased hip/knee flexion- Left, decreased ankle dorsiflexion- Left, and genu recurvatum- Left Distance walked: various clinic distances Assistive device utilized: None Level of assistance: SBA Comments: to be further assessed in a functional context   FUNCTIONAL TESTS:     VITALS: Vitals:   10/16/24 1106  BP: 103/70  Pulse: 65                                                                                                                                 TREATMENT DATE:    Self-Care/Home Management/Ther Act  BP assessed in LUE at rest (see above), vitals WNL SciFit multi-peaks level 1.5 for 8 minutes using BUE/BLEs for neural priming for reciprocal movement, dynamic cardiovascular warmup and increased amplitude of  stepping. Pt reported increase in LLE tightness/tingling w/activity. RPE of 6.5/10 following activity  Assessed pt's LLE swelling and noted consistent swelling from her ankle to her hip. Inquired about pitting edema and pt reports it was pitting the past few days, but not today. Educated pt on potential for lymphedema, especially post-radiation and pt interested in trying lymphedema therapy. Pt in agreement to request lymphedema PT referral from Dr. Gregg.  Rescheduled appointment next week on vestibular therapist to assess vertigo and advised pt to not take Meclizine unless she absolutely has to on that day. Pt verbalized understanding.  Demonstrated ways to prop LLE up to assist in swelling at home using peanut. Pt reports she can purchase small theraball to use at home. Recommended pt perform ankle pumps while elevating legs and to aim to position ankles above knees. Pt verbalized understanding.   PATIENT EDUCATION: Education details: Potential for lymphedema PT, how to prop legs up at home, see above for further details.  Person educated: Patient Education method: Explanation, Demonstration, Tactile cues, and Verbal cues Education comprehension: verbalized  understanding, returned demonstration, and needs further education  HOME EXERCISE PROGRAM: To be initiated  GOALS: Goals reviewed with patient? Yes  SHORT TERM GOALS: Target date: 10/07/2024   Pt will be independent with initial HEP for improved strength, balance, transfers and gait. Baseline: not yet established Goal status: IN PROGRESS  2.  Pt will improve 5 x STS to less than or equal to 46 seconds to demonstrate improved functional strength and transfer efficiency.  Baseline: 49.25 sec with BUE (9/23), 27.62 sec BUE (10/21) Goal status: MET  3.  Pt will improve gait velocity to at least 2.75 ft/sec for improved gait efficiency and performance at SBA level  Baseline: 2.47 ft/sec no AD SBA (9/23), 1.84 ft/sec no AD SBA (10/21) Goal status: NOT MET  4.  Pt will improve FGA to 10/30 for decreased fall risk  Baseline: 6/30 (9/23), 10/30 (10/21) Goal status: MET  5.  Pt will ambulate greater than or equal to 850 feet on with LRAD and mod I for improved cardiovascular endurance and BLE strength.  Baseline: 600 ft no AD (9/30) Goal status: INITIAL     LONG TERM GOALS: Target date: 10/28/2024   Pt will be independent with final HEP for improved strength, balance, transfers and gait. Baseline:  Goal status: INITIAL  2.  Pt will improve 5 x STS to less than or equal to 24 seconds to demonstrate improved functional strength and transfer efficiency.  Baseline: 49.25 sec with BUE (9/23), 27.62 sec BUE (10/21) Goal status: REVISED/UPGRADED  3.  Pt will improve gait velocity to at least 2.75 ft/sec for improved gait efficiency and performance at mod I level  Baseline: 2.47 ft/sec no AD SBA (9/23), 1.84 ft/sec no AD SBA (10/21) Goal status: REVISED/DOWNGRADED  4.  Pt will improve FGA to 14/30 for decreased fall risk  Baseline: 6/30 (9/23), 10/30 (10/21) Goal status: INITIAL  5.  Pt will improve normal TUG to less than or equal to 13 seconds for improved functional mobility  and decreased fall risk. Baseline: 15.6 sec no AD (9/23) Goal status: INITIAL  6.  Pt will ambulate greater than or equal to 1000 feet on with LRAD and mod I for improved cardiovascular endurance and BLE strength.  Baseline: 600 ft no AD (9/30) Goal status: INITIAL  ASSESSMENT:  CLINICAL IMPRESSION: Session limited as pt continues to have high levels of swelling/discomfort in LLE and is very fatigued from  last weekend. After SciFit, pt reported increase in tightness and tingling in her LLE and upon assessment, suspect lymphedema in LLE. Pt reports this has made it very difficult for her to progress her LLE and her leg is often very painful. Unable to complete assessment today due to pt's swelling and discomfort and pt in agreement to request lymphedema PT referral. Pt scheduled to see vestibular therapist next week as well as her vertigo has worsened over the weekend. Continue POC.     OBJECTIVE IMPAIRMENTS: Abnormal gait, cardiopulmonary status limiting activity, decreased activity tolerance, decreased balance, decreased cognition, decreased coordination, decreased endurance, decreased knowledge of use of DME, decreased mobility, difficulty walking, decreased ROM, decreased strength, dizziness, impaired perceived functional ability, impaired sensation, impaired UE functional use, impaired vision/preception, improper body mechanics, postural dysfunction, and pain.   ACTIVITY LIMITATIONS: carrying, lifting, bending, standing, squatting, stairs, transfers, bed mobility, reach over head, hygiene/grooming, and locomotion level  PARTICIPATION LIMITATIONS: meal prep, cleaning, laundry, medication management, interpersonal relationship, driving, shopping, and community activity  PERSONAL FACTORS: Time since onset of injury/illness/exacerbation and 3+ comorbidities:   GERD, anxiety, depression, seizures, sleep apnea, obesityare also affecting patient's functional outcome.   REHAB POTENTIAL:  Good  CLINICAL DECISION MAKING: Unstable/unpredictable  EVALUATION COMPLEXITY: High  PLAN:  PT FREQUENCY: 2x/week  PT DURATION: 6 weeks  PLANNED INTERVENTIONS: 02835- PT Re-evaluation, 97750- Physical Performance Testing, 97110-Therapeutic exercises, 97530- Therapeutic activity, 97112- Neuromuscular re-education, 97535- Self Care, 02859- Manual therapy, [phone]- Gait training, [phone]- Orthotic Initial, [phone]- Orthotic/Prosthetic subsequent, [phone]- Aquatic Therapy, Patient/Family education, Balance training, Stair training, Taping, Joint mobilization, Spinal mobilization, Vestibular training, Visual/preceptual remediation/compensation, Cognitive remediation, DME instructions, Cryotherapy, and Moist heat  PLAN FOR NEXT SESSION: assess STG-6MWT, did we get order for Speech Therapy (yes, did she schedule)? Did we get lymphedema order? handout for bedrail, work on balance and LLE strengthening, continue to work with stand alone cane, address R shoulder pain; work on turns with gait to the LEFT and 180 degree turns to the RIGHT  Sees Jen on 10/30 for vestibular screen - remind her to not take Meclizine unless she absolutely has to   [employer], PT, DPT   10/16/2024, 11:58 AM  For all possible CPT codes, reference the Planned Interventions line above.     Check all conditions that are expected to impact treatment: {Conditions expected to impact treatment:Neurological condition and/or seizures   If treatment provided at initial evaluation, no treatment charged due to lack of authorization.

## 2024-10-16 NOTE — Telephone Encounter (Signed)
 Pt [employer] access form faxed on 10/16/2024

## 2024-10-21 ENCOUNTER — Ambulatory Visit: Admitting: Physical Therapy

## 2024-10-21 ENCOUNTER — Telehealth: Payer: Self-pay | Admitting: Physical Therapy

## 2024-10-21 NOTE — Therapy (Incomplete)
 OUTPATIENT PHYSICAL THERAPY NEURO TREATMENT   Patient Name: Evelyn [name] MRN: [id number] DOB:[birth year], 53 y.o., female Today's Date: 10/21/2024   PCP: Evelyn [name] REFERRING PROVIDER: Valma Lannie DELENA, PA-C; Evelyn [name]  END OF SESSION:        Past Medical History:  Diagnosis Date   Anemia    Anxiety    Depression    Family history of breast cancer    Family history of colon cancer    GERD (gastroesophageal reflux disease)    Headache    Obesity    Seizures (HCC)    Sleep apnea    Past Surgical History:  Procedure Laterality Date   ABDOMINAL HYSTERECTOMY     APPLICATION OF CRANIAL NAVIGATION N/A 02/08/2023   Procedure: APPLICATION OF CRANIAL NAVIGATION;  Surgeon: Evelyn Debby DELENA, MD;  Location: [facility] OR;  Service: Neurosurgery;  Laterality: N/A;   BREAST LUMPECTOMY Left 1997   CHOLECYSTECTOMY     CRANIOTOMY Left 02/08/2023   Procedure: Left craniotomy for tumor resection with stealth navigation;  Surgeon: Evelyn Debby DELENA, MD;  Location: [facility] OR;  Service: Neurosurgery;  Laterality: Left;   GASTRIC BYPASS  03/2017   THROAT SURGERY     removed scar tissue from throat d/t being intubated.    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) 03/12/2023   Fever 03/12/2023   Acute pulmonary embolism (HCC) 03/11/2023   Pulmonary embolism on left 03/09/2023   Abdominal pain 03/09/2023   Normocytic anemia 03/09/2023   Adjustment disorder with anxious mood 02/23/2023   Adjustment reaction with anxiety and depression 02/23/2023   Atypical intracranial meningioma (HCC) 02/22/2023   Meningioma,  cerebral (HCC) 02/15/2023   Brain tumor (HCC) 02/08/2023   Boil 03/15/2022   Patellofemoral arthritis of left knee 02/08/2018   Thiamine deficiency 10/23/2017   Vitamin D  deficiency 10/23/2017   Anxiety 08/09/2017   H/O gastric bypass 05/22/2017   Postgastrectomy malabsorption 05/22/2017   Obesity, Class III, BMI 40-49.9 (morbid obesity) (HCC) 04/16/2017   Chronic low back pain 04/06/2017   Essential hypertension 04/06/2017   Lumbar disc disease 01/16/2017   Other hyperlipidemia 01/16/2017   Prediabetes 01/16/2017   OSA (obstructive sleep apnea) 01/16/2017   Moderate episode of recurrent major depressive disorder (HCC) 11/09/2016    ONSET DATE: 08/19/2024 (referral date)  REFERRING DIAG: R53.1 (ICD-10-CM) - Weakness R26.89 (ICD-10-CM) - Other abnormalities of gait and mobility D32.9 (ICD-10-CM) - Benign neoplasm of meninges, unspecified  THERAPY DIAG:  No diagnosis found.  Rationale for Evaluation and Treatment: Rehabilitation  SUBJECTIVE:  SUBJECTIVE STATEMENT:  Pt reports is has been a rough week, had a set back this weekend that triggered her vertigo and she still feels sore and tired. No falls.   ***   From PT eval: Pt reports that in 2023 she started having seizures, found out she had a tumor in her brain, in 2024 she had brain surgery and had the meningioma removed from the R side of her brain so the L side of her body has been affected. 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 She reports that she continues to have falls, gets dizzy spells, has blurry vision, etc. She reports that her LLE will give out on her at times and can have trouble feeling where her L leg is. She last saw Evelyn [name] on 8/4 and he suggested she start using a cane but she  wasn't sure what type to use.  She is unable to stand to cook, has to microwave meals. Currently she is using a shower seat as she is afraid to stand up in the shower. Getting herself cleaned and dressed takes a lot of time, she gets dizzy if she stands up too fast or bends forwards. She currently lives alone but plans to move in with her mom soon.  She has also been having memory problems, her mom has to call her to remind her to take her medications. Educated her that she can set a timer/reminder on her phone for this, her son will help her set it up.   Pt accompanied by: family member aunt Evelyn [name]; no driving - still having seizures  PERTINENT HISTORY: PMH: GERD, anxiety, depression, seizures, sleep apnea, obesity  PAIN:  Are you having pain? No  PRECAUTIONS: Fall  RED FLAGS: None   WEIGHT BEARING RESTRICTIONS: No  FALLS: Has patient fallen in last 6 months? Yes. Number of falls being very cautious so does more stumbling than falls, multiple times per day, reaching out for stuff  LIVING ENVIRONMENT: Lives with: lives alone; plans to move in with her mom Lives in: House/apartment Stairs: No, hard wood floors Has following equipment at home: None  PLOF: Independent with basic ADLs, Independent with gait, Independent with transfers, and Requires assistive device for independence  PATIENT GOALS: gain some strength back into this L side I dont want to be completely immobile  OBJECTIVE:  Note: Objective measures were completed at Evaluation unless otherwise noted.  DIAGNOSTIC FINDINGS:   Brain MRI 12/07/2023 IMPRESSION: 1. Stable to slightly decreased size of residual tumor in the left anterior cranial fossa measuring 6 x 26 x 21 mm. 2. Stable vasogenic edema in the left frontal lobe. 3. No acute intracranial abnormality or significant interval change.  COGNITION: Overall cognitive status: Impaired; has noticed issues with memory, problem-solving, etc.   SENSATION: Has  N/T in feet, feet go numb sometimes Impaired proprioception in LLE>RLE Decreased light touch sensation in BLE LLE>RLE  COORDINATION: Impaired in L hemibody  EDEMA:  In LLE>RLE, wears support socks  POSTURE: rounded shoulders, forward head, and posterior pelvic tilt  LOWER EXTREMITY ROM:   WFL  Passive  Right Eval Left Eval  Hip flexion    Hip extension    Hip abduction    Hip adduction    Hip internal rotation    Hip external rotation    Knee flexion    Knee extension    Ankle dorsiflexion    Ankle plantarflexion    Ankle inversion  Ankle eversion     (Blank rows = not tested)  LOWER EXTREMITY MMT:    MMT Right Eval Left Eval  Hip flexion 5 2+  Hip extension    Hip abduction    Hip adduction    Hip internal rotation    Hip external rotation    Knee flexion 5 2+  Knee extension 5 2+  Ankle dorsiflexion 5 2+  Ankle plantarflexion    Ankle inversion    Ankle eversion    (Blank rows = not tested)  BED MOBILITY:  Mod I (sleeps in regular bed, had to pull from mattress, little rough)  TRANSFERS: Sit to stand: Modified independence  Assistive device utilized: None     Stand to sit: Modified independence  Assistive device utilized: None     Chair to chair: Modified independence  Assistive device utilized: None       *needs increased time  RAMP:  Not tested  CURB:  Not tested  STAIRS: Not tested GAIT: Findings:  Gait pattern: decreased hip/knee flexion- Left, decreased ankle dorsiflexion- Left, and genu recurvatum- Left Distance walked: various clinic distances Assistive device utilized: None Level of assistance: SBA Comments: to be further assessed in a functional context   FUNCTIONAL TESTS:     VITALS: There were no vitals filed for this visit.                                                                                                                                TREATMENT DATE:    Self-Care/Home Management/Ther Act  BP  assessed in LUE at rest (see above), vitals WNL SciFit multi-peaks level 1.5 for 8 minutes using BUE/BLEs for neural priming for reciprocal movement, dynamic cardiovascular warmup and increased amplitude of stepping. Pt reported increase in LLE tightness/tingling w/activity. RPE of 6.5/10 following activity  Assessed pt's LLE swelling and noted consistent swelling from her ankle to her hip. Inquired about pitting edema and pt reports it was pitting the past few days, but not today. Educated pt on potential for lymphedema, especially post-radiation and pt interested in trying lymphedema therapy. Pt in agreement to request lymphedema PT referral from Evelyn [name].  Rescheduled appointment next week on vestibular therapist to assess vertigo and advised pt to not take Meclizine unless she absolutely has to on that day. Pt verbalized understanding.  Demonstrated ways to prop LLE up to assist in swelling at home using peanut. Pt reports she can purchase small theraball to use at home. Recommended pt perform ankle pumps while elevating legs and to aim to position ankles above knees. Pt verbalized understanding.  ***  PATIENT EDUCATION: Education details: Potential for lymphedema PT, how to prop legs up at home, see above for further details. *** Person educated: Patient Education method: Explanation, Demonstration, Tactile cues, and Verbal cues Education comprehension: verbalized understanding, returned demonstration, and needs further education  HOME EXERCISE PROGRAM: To be initiated  GOALS: Goals reviewed with patient?  Yes  SHORT TERM GOALS: Target date: 10/07/2024***   Pt will be independent with initial HEP for improved strength, balance, transfers and gait. Baseline: not yet established Goal status: IN PROGRESS  2.  Pt will improve 5 x STS to less than or equal to 46 seconds to demonstrate improved functional strength and transfer efficiency.  Baseline: 49.25 sec with BUE (9/23), 27.62 sec BUE  (10/21) Goal status: MET  3.  Pt will improve gait velocity to at least 2.75 ft/sec for improved gait efficiency and performance at SBA level  Baseline: 2.47 ft/sec no AD SBA (9/23), 1.84 ft/sec no AD SBA (10/21) Goal status: NOT MET  4.  Pt will improve FGA to 10/30 for decreased fall risk  Baseline: 6/30 (9/23), 10/30 (10/21) Goal status: MET  5.  Pt will ambulate greater than or equal to 850 feet on with LRAD and mod I for improved cardiovascular endurance and BLE strength.  Baseline: 600 ft no AD (9/30) Goal status: INITIAL     LONG TERM GOALS: Target date: 10/28/2024   Pt will be independent with final HEP for improved strength, balance, transfers and gait. Baseline:  Goal status: INITIAL  2.  Pt will improve 5 x STS to less than or equal to 24 seconds to demonstrate improved functional strength and transfer efficiency.  Baseline: 49.25 sec with BUE (9/23), 27.62 sec BUE (10/21) Goal status: REVISED/UPGRADED  3.  Pt will improve gait velocity to at least 2.75 ft/sec for improved gait efficiency and performance at mod I level  Baseline: 2.47 ft/sec no AD SBA (9/23), 1.84 ft/sec no AD SBA (10/21) Goal status: REVISED/DOWNGRADED  4.  Pt will improve FGA to 14/30 for decreased fall risk  Baseline: 6/30 (9/23), 10/30 (10/21) Goal status: INITIAL  5.  Pt will improve normal TUG to less than or equal to 13 seconds for improved functional mobility and decreased fall risk. Baseline: 15.6 sec no AD (9/23) Goal status: INITIAL  6.  Pt will ambulate greater than or equal to 1000 feet on with LRAD and mod I for improved cardiovascular endurance and BLE strength.  Baseline: 600 ft no AD (9/30) Goal status: INITIAL  ASSESSMENT:  CLINICAL IMPRESSION: Session limited as pt continues to have high levels of swelling/discomfort in LLE and is very fatigued from last weekend. After SciFit, pt reported increase in tightness and tingling in her LLE and upon assessment, suspect  lymphedema in LLE. Pt reports this has made it very difficult for her to progress her LLE and her leg is often very painful. Unable to complete assessment today due to pt's swelling and discomfort and pt in agreement to request lymphedema PT referral. Pt scheduled to see vestibular therapist next week as well as her vertigo has worsened over the weekend. Continue POC.  Emphasis of skilled PT session*** Continue POC.      OBJECTIVE IMPAIRMENTS: Abnormal gait, cardiopulmonary status limiting activity, decreased activity tolerance, decreased balance, decreased cognition, decreased coordination, decreased endurance, decreased knowledge of use of DME, decreased mobility, difficulty walking, decreased ROM, decreased strength, dizziness, impaired perceived functional ability, impaired sensation, impaired UE functional use, impaired vision/preception, improper body mechanics, postural dysfunction, and pain.   ACTIVITY LIMITATIONS: carrying, lifting, bending, standing, squatting, stairs, transfers, bed mobility, reach over head, hygiene/grooming, and locomotion level  PARTICIPATION LIMITATIONS: meal prep, cleaning, laundry, medication management, interpersonal relationship, driving, shopping, and community activity  PERSONAL FACTORS: Time since onset of injury/illness/exacerbation and 3+ comorbidities:   GERD, anxiety, depression, seizures, sleep  apnea, obesityare also affecting patient's functional outcome.   REHAB POTENTIAL: Good  CLINICAL DECISION MAKING: Unstable/unpredictable  EVALUATION COMPLEXITY: High  PLAN:  PT FREQUENCY: 2x/week  PT DURATION: 6 weeks  PLANNED INTERVENTIONS: 02835- PT Re-evaluation, 97750- Physical Performance Testing, 97110-Therapeutic exercises, 97530- Therapeutic activity, 97112- Neuromuscular re-education, 97535- Self Care, 02859- Manual therapy, [phone]- Gait training, [phone]- Orthotic Initial, [phone]- Orthotic/Prosthetic subsequent, [phone]- Aquatic Therapy,  Patient/Family education, Balance training, Stair training, Taping, Joint mobilization, Spinal mobilization, Vestibular training, Visual/preceptual remediation/compensation, Cognitive remediation, DME instructions, Cryotherapy, and Moist heat  PLAN FOR NEXT SESSION: assess STG-6MWT, did we get order for Speech Therapy (yes, did she schedule)? Did we get lymphedema order? handout for bedrail, work on balance and LLE strengthening, continue to work with stand alone cane, address R shoulder pain; work on turns with gait to the LEFT and 180 degree turns to the RIGHT  Sees Jen on 10/30 for vestibular screen - remind her to not take Meclizine unless she absolutely has to***   Waddell Southgate, PT Waddell Southgate, PT, DPT, CSRS    10/21/2024, 7:38 AM  For all possible CPT codes, reference the Planned Interventions line above.     Check all conditions that are expected to impact treatment: {Conditions expected to impact treatment:Neurological condition and/or seizures   If treatment provided at initial evaluation, no treatment charged due to lack of authorization.

## 2024-10-23 ENCOUNTER — Ambulatory Visit

## 2024-10-28 ENCOUNTER — Ambulatory Visit: Attending: Physician Assistant | Admitting: Physical Therapy

## 2024-10-28 VITALS — BP 103/65 | HR 62

## 2024-10-28 DIAGNOSIS — R2681 Unsteadiness on feet: Secondary | ICD-10-CM | POA: Diagnosis present

## 2024-10-28 DIAGNOSIS — R2689 Other abnormalities of gait and mobility: Secondary | ICD-10-CM | POA: Insufficient documentation

## 2024-10-28 DIAGNOSIS — R42 Dizziness and giddiness: Secondary | ICD-10-CM | POA: Insufficient documentation

## 2024-10-28 DIAGNOSIS — M6281 Muscle weakness (generalized): Secondary | ICD-10-CM | POA: Diagnosis present

## 2024-10-28 DIAGNOSIS — R278 Other lack of coordination: Secondary | ICD-10-CM | POA: Insufficient documentation

## 2024-10-28 NOTE — Therapy (Signed)
 OUTPATIENT PHYSICAL THERAPY NEURO TREATMENT   Patient Name: Evelyn [name] MRN: [id number] DOB:03-03-1971, 53 y.o., female Today's Date: 10/28/2024   PCP: Valma Lannie DELENA DEVONNA REFERRING PROVIDER: Valma Lannie DELENA, PA-C; Dr. Gregg  END OF SESSION:  PT End of Session - 10/28/24 1059     Visit Number 7    Number of Visits 13   with eval   Date for Recertification  11/11/24    Authorization Type [employer]    PT Start Time 1059    PT Stop Time 1145    PT Time Calculation (min) 46 min    Activity Tolerance Patient limited by pain;Patient limited by fatigue;Other (comment)   Potential Seizure activity   Behavior During Therapy WFL for tasks assessed/performed;Anxious              Past Medical History:  Diagnosis Date   Anemia    Anxiety    Depression    Family history of breast cancer    Family history of colon cancer    GERD (gastroesophageal reflux disease)    Headache    Obesity    Seizures (HCC)    Sleep apnea    Past Surgical History:  Procedure Laterality Date   ABDOMINAL HYSTERECTOMY     APPLICATION OF CRANIAL NAVIGATION N/A 02/08/2023   Procedure: APPLICATION OF CRANIAL NAVIGATION;  Surgeon: Cheryle Debby DELENA, MD;  Location: [facility] OR;  Service: Neurosurgery;  Laterality: N/A;   BREAST LUMPECTOMY Left 1997   CHOLECYSTECTOMY     CRANIOTOMY Left 02/08/2023   Procedure: Left craniotomy for tumor resection with stealth navigation;  Surgeon: Cheryle Debby DELENA, MD;  Location: [facility] OR;  Service: Neurosurgery;  Laterality: Left;   GASTRIC BYPASS  03/2017   THROAT SURGERY     removed scar tissue from throat d/t being intubated.    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) 03/12/2023   Fever 03/12/2023   Acute pulmonary embolism (HCC) 03/11/2023   Pulmonary embolism on left 03/09/2023   Abdominal pain 03/09/2023   Normocytic anemia 03/09/2023   Adjustment disorder with anxious mood 02/23/2023   Adjustment reaction with anxiety and depression 02/23/2023   Atypical intracranial meningioma (HCC) 02/22/2023   Meningioma, cerebral (HCC) 02/15/2023   Brain tumor (HCC) 02/08/2023   Boil 03/15/2022   Patellofemoral arthritis of left knee 02/08/2018   Thiamine deficiency 10/23/2017   Vitamin D  deficiency 10/23/2017   Anxiety 08/09/2017   H/O gastric bypass 05/22/2017   Postgastrectomy malabsorption 05/22/2017   Obesity, Class III, BMI 40-49.9 (morbid obesity) (HCC) 04/16/2017   Chronic low back pain 04/06/2017   Essential hypertension 04/06/2017   Lumbar disc disease 01/16/2017   Other hyperlipidemia 01/16/2017   Prediabetes 01/16/2017   OSA (obstructive sleep apnea) 01/16/2017   Moderate episode of recurrent major depressive disorder (HCC) 11/09/2016    ONSET DATE: 08/19/2024 (referral date)  REFERRING DIAG: R53.1 (ICD-10-CM) - Weakness R26.89 (ICD-10-CM) - Other abnormalities of gait and mobility D32.9 (ICD-10-CM) - Benign neoplasm of meninges, unspecified  THERAPY DIAG:  Muscle weakness (generalized)  Other abnormalities of gait and mobility  Unsteadiness on feet  Dizziness and giddiness  Rationale for Evaluation and Treatment: Rehabilitation  SUBJECTIVE:                                                                                                                                                                                             SUBJECTIVE STATEMENT:  Pt reports is has been a rough week, holding a foldable standalone cane which is her mom's. States her cane never came in from [employer], not sure what happened.   LLE has been excruciating, to the point  where she cannot sleep.   Reports she has had some stumbles but no falls. I am really good at catching myself.    From PT eval: Pt reports that in 2023 she started having seizures, found out she had a tumor in her brain, in 2024 she had brain surgery and had the meningioma removed from the R side of her brain so the L side of her body has been affected. 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 She reports that she continues to have falls, gets dizzy spells, has blurry vision, etc. She reports that her LLE will give out on her at times and can have trouble feeling where her L leg is. She last saw Dr. Gregg on 8/4 and he suggested she start using a cane but she wasn't sure what type to use.  She is unable to stand to cook, has to microwave meals. Currently she is using a shower seat as she is afraid to stand up in the shower. Getting herself cleaned and dressed takes a lot of time, she gets dizzy if she stands up too fast or bends forwards. She currently lives alone but plans to move in with her mom soon.  She has also been having memory problems, her mom has to call her to remind her to take her medications. Educated her that she can set a timer/reminder on her phone for this, her son will help her set it up.   Pt accompanied by: family member aunt [name]; no driving - still having seizures  PERTINENT HISTORY: PMH: GERD, anxiety, depression, seizures, sleep apnea, obesity  PAIN:  Are you having pain? Yes, 7.5-8/10 in LLE  PRECAUTIONS: Fall  RED FLAGS: None   WEIGHT BEARING RESTRICTIONS: No  FALLS: Has patient fallen in last 6 months? Yes. Number of falls being very cautious so does more stumbling than falls, multiple times per day, reaching out for stuff  LIVING ENVIRONMENT: Lives with: lives alone; plans to move in with her mom Lives in: House/apartment Stairs: No, hard wood floors Has following equipment  at home: None  PLOF: Independent  with basic ADLs, Independent with gait, Independent with transfers, and Requires assistive device for independence  PATIENT GOALS: gain some strength back into this L side I dont want to be completely immobile  OBJECTIVE:  Note: Objective measures were completed at Evaluation unless otherwise noted.  DIAGNOSTIC FINDINGS:   Brain MRI 12/07/2023 IMPRESSION: 1. Stable to slightly decreased size of residual tumor in the left anterior cranial fossa measuring 6 x 26 x 21 mm. 2. Stable vasogenic edema in the left frontal lobe. 3. No acute intracranial abnormality or significant interval change.  COGNITION: Overall cognitive status: Impaired; has noticed issues with memory, problem-solving, etc.   SENSATION: Has N/T in feet, feet go numb sometimes Impaired proprioception in LLE>RLE Decreased light touch sensation in BLE LLE>RLE  COORDINATION: Impaired in L hemibody  EDEMA:  In LLE>RLE, wears support socks  POSTURE: rounded shoulders, forward head, and posterior pelvic tilt  LOWER EXTREMITY ROM:   WFL  Passive  Right Eval Left Eval  Hip flexion    Hip extension    Hip abduction    Hip adduction    Hip internal rotation    Hip external rotation    Knee flexion    Knee extension    Ankle dorsiflexion    Ankle plantarflexion    Ankle inversion    Ankle eversion     (Blank rows = not tested)  LOWER EXTREMITY MMT:    MMT Right Eval Left Eval  Hip flexion 5 2+  Hip extension    Hip abduction    Hip adduction    Hip internal rotation    Hip external rotation    Knee flexion 5 2+  Knee extension 5 2+  Ankle dorsiflexion 5 2+  Ankle plantarflexion    Ankle inversion    Ankle eversion    (Blank rows = not tested)  BED MOBILITY:  Mod I (sleeps in regular bed, had to pull from mattress, little rough)  TRANSFERS: Sit to stand: Modified independence  Assistive device utilized: None     Stand to sit: Modified independence  Assistive device utilized: None      Chair to chair: Modified independence  Assistive device utilized: None       *needs increased time  RAMP:  Not tested  CURB:  Not tested  STAIRS: Not tested GAIT: Findings:  Gait pattern: decreased hip/knee flexion- Left, decreased ankle dorsiflexion- Left, and genu recurvatum- Left Distance walked: various clinic distances Assistive device utilized: None Level of assistance: SBA Comments: to be further assessed in a functional context   FUNCTIONAL TESTS:     VITALS: Vitals:   10/28/24 1106 10/28/24 1135  BP: 112/67 103/65  Pulse: 66 62                                                                                                                                  TREATMENT DATE:  Self-Care/Home Management/Ther Act  Encouraged pt to reach out to PCP regarding LLE pain/swelling and inquire about lymphedema PT. Pt states she would call today after session.  Pt reports she applied to get transportation through [employer] and will know if she is approved by 11/14.  BP assessed in LUE at rest (see above), vitals WNL SciFit multi-peaks level 1.5 for 10 minutes using BUE/BLEs for neural priming for reciprocal movement, dynamic cardiovascular warmup and increased amplitude of stepping. Pt reported increase in LLE tightness/tingling w/activity. RPE of 6.5-7/10 following activity    Gait pattern: step through pattern, decreased arm swing- Right, decreased arm swing- Left, decreased stride length, decreased hip/knee flexion- Left, decreased ankle dorsiflexion- Left, lateral hip instability, decreased trunk rotation, and wide BOS Distance walked: 115' loop completed 4x = 460'  Assistive device utilized: None Level of assistance: CGA Comments: Pt only able to tolerate 4 minutes of activity due to dizziness and LLE pain.  Provided pt w/water following and pt peseverating that therapist is keeping her focused. Pt reported ongoing dizziness that was leveling out.  Due  to seizure concern, assisted pt into supine on mat table w/min A as she was slowly becoming less responsive and assessed vitals (see above) and BP WNL. Placed bolster under knees and had pt lie down for ~8 minutes while maintaining conversation w/pt. Pt reports if she starts to feel like this at home, she will lie down and sleep it off. Pt also stating this is what she feels like if she is about to have a seizure, but reported she was okay. Obtained second therapist to assist pt into [facility] w/min A and transported pt out to mother's vehicle in [facility]. Pt apologetic to therapist, stating she thought she could push through. Assisted pt into car w/CGA and informed mother of pt's behavior and to monitor for seizure activity. Pt inquiring about a doctor's note for work tomorrow, so encouraged her to reach out to PCP.    PATIENT EDUCATION: Education details: Contact PCP about lymphedema PT and work note  Person educated: Patient and Parent Education method: Programmer, Multimedia, Facilities Manager, Actor cues, and Verbal cues Education comprehension: verbalized understanding, returned demonstration, and needs further education  HOME EXERCISE PROGRAM: To be initiated  GOALS: Goals reviewed with patient? Yes  SHORT TERM GOALS: Target date: 10/07/2024   Pt will be independent with initial HEP for improved strength, balance, transfers and gait. Baseline: not yet established Goal status: IN PROGRESS  2.  Pt will improve 5 x STS to less than or equal to 46 seconds to demonstrate improved functional strength and transfer efficiency.  Baseline: 49.25 sec with BUE (9/23), 27.62 sec BUE (10/21) Goal status: MET  3.  Pt will improve gait velocity to at least 2.75 ft/sec for improved gait efficiency and performance at SBA level  Baseline: 2.47 ft/sec no AD SBA (9/23), 1.84 ft/sec no AD SBA (10/21) Goal status: NOT MET  4.  Pt will improve FGA to 10/30 for decreased fall risk  Baseline: 6/30 (9/23), 10/30 (10/21) Goal  status: MET  5.  Pt will ambulate greater than or equal to 850 feet on with LRAD and mod I for improved cardiovascular endurance and BLE strength.  Baseline: 600 ft no AD (9/30); 460' no AD (11/4) Goal status: NOT MET     LONG TERM GOALS: Target date: 10/30/2024 (updated to match last appointment date)    Pt will be independent with final HEP for improved strength, balance, transfers and gait. Baseline:  Goal status: INITIAL  2.  Pt will improve 5 x STS to less than or equal to 24 seconds to demonstrate improved functional strength and transfer efficiency.  Baseline: 49.25 sec with BUE (9/23), 27.62 sec BUE (10/21) Goal status: REVISED/UPGRADED  3.  Pt will improve gait velocity to at least 2.75 ft/sec for improved gait efficiency and performance at mod I level  Baseline: 2.47 ft/sec no AD SBA (9/23), 1.84 ft/sec no AD SBA (10/21) Goal status: REVISED/DOWNGRADED  4.  Pt will improve FGA to 14/30 for decreased fall risk  Baseline: 6/30 (9/23), 10/30 (10/21) Goal status: INITIAL  5.  Pt will improve normal TUG to less than or equal to 13 seconds for improved functional mobility and decreased fall risk. Baseline: 15.6 sec no AD (9/23) Goal status: INITIAL  6.  Pt will ambulate greater than or equal to 1000 feet on with LRAD and mod I for improved cardiovascular endurance and BLE strength.  Baseline: 600 ft no AD (9/30); 460' no AD (11/4) Goal status: NOT MET   ASSESSMENT:  CLINICAL IMPRESSION: Session limited by high pain levels in LLE and seizure activity. Pt reporting increased pain in LLE that disrupts her sleep and ADLS, so continue to recommend she contact PCP and inquire about lymphedema and starting lymphedema PT. Assessed today and pt able to ambulate 460' in 4 minutes, but reported onset of dizziness and had pt sit and end test. While seated, pt frequently staring out into space and repeating herself, so had pt lie down due to concern of absent seizure  activity. Vitals WNL. Pt escorted to mother's vehicle in [facility] and was able to maintain conversation w/therapist, but spoke robotically and noted facial twitching. Informed mother of suspected seizure activity and pt reports when this happens she goes home and sleeps it off. Pt insisting she was okay and did not need emergency services. Continue POC.      OBJECTIVE IMPAIRMENTS: Abnormal gait, cardiopulmonary status limiting activity, decreased activity tolerance, decreased balance, decreased cognition, decreased coordination, decreased endurance, decreased knowledge of use of DME, decreased mobility, difficulty walking, decreased ROM, decreased strength, dizziness, impaired perceived functional ability, impaired sensation, impaired UE functional use, impaired vision/preception, improper body mechanics, postural dysfunction, and pain.   ACTIVITY LIMITATIONS: carrying, lifting, bending, standing, squatting, stairs, transfers, bed mobility, reach over head, hygiene/grooming, and locomotion level  PARTICIPATION LIMITATIONS: meal prep, cleaning, laundry, medication management, interpersonal relationship, driving, shopping, and community activity  PERSONAL FACTORS: Time since onset of injury/illness/exacerbation and 3+ comorbidities:   GERD, anxiety, depression, seizures, sleep apnea, obesityare also affecting patient's functional outcome.   REHAB POTENTIAL: Good  CLINICAL DECISION MAKING: Unstable/unpredictable  EVALUATION COMPLEXITY: High  PLAN:  PT FREQUENCY: 2x/week  PT DURATION: 6 weeks  PLANNED INTERVENTIONS: 02835- PT Re-evaluation, 97750- Physical Performance Testing, 97110-Therapeutic exercises, 97530- Therapeutic activity, [id number]- Neuromuscular re-education, 97535- Self Care, [id number]- Manual therapy, [phone]- Gait training, [phone]- Orthotic Initial, [phone]- Orthotic/Prosthetic subsequent, [phone]- Aquatic Therapy, Patient/Family education, Balance training, Stair training, Taping, Joint  mobilization, Spinal mobilization, Vestibular training, Visual/preceptual remediation/compensation, Cognitive remediation, DME instructions, Cryotherapy, and Moist heat  PLAN FOR NEXT SESSION: Did she contact PCP about lymphedema? DC vs recert based on lymphedema PT.    handout for bedrail, work on balance and LLE strengthening, continue to work with stand alone cane, address R shoulder pain; work on turns with gait to the LEFT and 180 degree turns to the RIGHT    Tonyia Marschall E Blakelynn Scheeler, PT, DPT 10/28/2024, 12:03 PM  For all  possible CPT codes, reference the Planned Interventions line above.     Check all conditions that are expected to impact treatment: {Conditions expected to impact treatment:Neurological condition and/or seizures   If treatment provided at initial evaluation, no treatment charged due to lack of authorization.

## 2024-10-30 ENCOUNTER — Telehealth: Payer: Self-pay | Admitting: Neurology

## 2024-10-30 ENCOUNTER — Ambulatory Visit: Admitting: Physical Therapy

## 2024-10-30 VITALS — BP 110/71 | HR 69

## 2024-10-30 DIAGNOSIS — R2681 Unsteadiness on feet: Secondary | ICD-10-CM

## 2024-10-30 DIAGNOSIS — R278 Other lack of coordination: Secondary | ICD-10-CM

## 2024-10-30 DIAGNOSIS — R42 Dizziness and giddiness: Secondary | ICD-10-CM

## 2024-10-30 DIAGNOSIS — M6281 Muscle weakness (generalized): Secondary | ICD-10-CM | POA: Diagnosis not present

## 2024-10-30 DIAGNOSIS — R2689 Other abnormalities of gait and mobility: Secondary | ICD-10-CM

## 2024-10-30 NOTE — Therapy (Signed)
 OUTPATIENT PHYSICAL THERAPY NEURO TREATMENT - DISCHARGE NOTE   Patient Name: Evelyn [name] MRN: [id number] DOB:02-23-1971, 53 y.o., female Today's Date: 10/30/2024   PCP: Valma Lannie DELENA DEVONNA REFERRING PROVIDER: Valma Lannie DELENA, PA-C; Dr. Gregg  PHYSICAL THERAPY DISCHARGE SUMMARY  Visits from Start of Care: 8  Current functional level related to goals / functional outcomes: Mod I   Remaining deficits: Decreased LLE strength and ROM secondary to edema, impaired balance   Education / Equipment: Use stand alone cane once it arrives   Patient agrees to discharge. Patient goals were not met. Patient is being discharged due to lack of medical stability.   END OF SESSION:  PT End of Session - 10/30/24 1105     Visit Number 8    Number of Visits 13   with eval   Date for Recertification  11/11/24    Authorization Type [employer]    PT Start Time 1100    PT Stop Time 1140    PT Time Calculation (min) 40 min    Equipment Utilized During Treatment Gait belt    Activity Tolerance Patient limited by fatigue;Patient limited by pain    Behavior During Therapy [facility] for tasks assessed/performed;Anxious               Past Medical History:  Diagnosis Date   Anemia    Anxiety    Depression    Family history of breast cancer    Family history of colon cancer    GERD (gastroesophageal reflux disease)    Headache    Obesity    Seizures (HCC)    Sleep apnea    Past Surgical History:  Procedure Laterality Date   ABDOMINAL HYSTERECTOMY     APPLICATION OF CRANIAL NAVIGATION N/A 02/08/2023   Procedure: APPLICATION OF CRANIAL NAVIGATION;  Surgeon: Cheryle Debby DELENA, MD;  Location: [facility] OR;  Service: Neurosurgery;  Laterality: N/A;   BREAST LUMPECTOMY Left 1997   CHOLECYSTECTOMY     CRANIOTOMY Left 02/08/2023   Procedure: Left craniotomy for tumor resection with stealth navigation;  Surgeon: Cheryle Debby DELENA, MD;  Location: [facility] OR;  Service: Neurosurgery;   Laterality: Left;   GASTRIC BYPASS  03/2017   THROAT SURGERY     removed scar tissue from throat d/t being intubated.    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) 03/12/2023   Fever 03/12/2023   Acute pulmonary embolism (HCC) 03/11/2023   Pulmonary embolism on left 03/09/2023   Abdominal pain 03/09/2023   Normocytic anemia 03/09/2023   Adjustment disorder with anxious mood 02/23/2023   Adjustment reaction with anxiety and depression 02/23/2023   Atypical intracranial meningioma (HCC) 02/22/2023   Meningioma, cerebral (HCC) 02/15/2023   Brain tumor (HCC) 02/08/2023   Boil 03/15/2022   Patellofemoral arthritis of left knee 02/08/2018   Thiamine deficiency 10/23/2017   Vitamin D  deficiency 10/23/2017   Anxiety 08/09/2017   H/O gastric bypass 05/22/2017   Postgastrectomy malabsorption 05/22/2017   Obesity, Class III, BMI 40-49.9 (morbid obesity) (HCC) 04/16/2017   Chronic low back pain 04/06/2017   Essential hypertension 04/06/2017   Lumbar disc disease 01/16/2017  Other hyperlipidemia 01/16/2017   Prediabetes 01/16/2017   OSA (obstructive sleep apnea) 01/16/2017   Moderate episode of recurrent major depressive disorder (HCC) 11/09/2016    ONSET DATE: 08/19/2024 (referral date)  REFERRING DIAG: R53.1 (ICD-10-CM) - Weakness R26.89 (ICD-10-CM) - Other abnormalities of gait and mobility D32.9 (ICD-10-CM) - Benign neoplasm of meninges, unspecified  THERAPY DIAG:  Muscle weakness (generalized)  Other abnormalities of gait and mobility  Unsteadiness on feet  Dizziness and giddiness  Other lack of  coordination  Rationale for Evaluation and Treatment: Rehabilitation  SUBJECTIVE:                                                                                                                                                                                             SUBJECTIVE STATEMENT:  Pt reports that things have been slow since she was here last visit. She did have an absent seizure last visit and that usually takes her a few days to recover from that.  She still has to call her PCP about lymphedema, didn't call him yet because of seizure. Once she finishes lymphedema therapy will return to PT.   From PT eval: Pt reports that in 2023 she started having seizures, found out she had a tumor in her brain, in 2024 she had brain surgery and had the meningioma removed from the R side of her brain so the L side of her body has been affected. 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 She reports that she continues to have falls, gets dizzy spells, has blurry vision, etc. She reports that her LLE will give out on her at times and can have trouble feeling where her L leg is. She last saw Dr. Gregg on 8/4 and he suggested she start using a cane but she wasn't sure what type to use.  She is unable to stand to cook, has to microwave meals. Currently she is using a shower seat as she is afraid to stand up in the shower. Getting herself cleaned and dressed takes a lot of time, she gets dizzy if she stands up too fast or bends forwards. She currently lives alone but plans to move in with her mom soon.  She has also been having memory problems, her mom has to call her to remind her to take her medications. Educated her that she can set a timer/reminder on her phone for this, her son will help her set it up.   Pt accompanied by: family member aunt [name]; no driving - still having seizures  PERTINENT HISTORY: PMH: GERD, anxiety, depression,  seizures, sleep apnea,  obesity  PAIN:  Are you having pain? Yes, 7.5-8/10 in LLE  PRECAUTIONS: Fall  RED FLAGS: None   WEIGHT BEARING RESTRICTIONS: No  FALLS: Has patient fallen in last 6 months? Yes. Number of falls being very cautious so does more stumbling than falls, multiple times per day, reaching out for stuff  LIVING ENVIRONMENT: Lives with: lives alone; plans to move in with her mom Lives in: House/apartment Stairs: No, hard wood floors Has following equipment at home: None  PLOF: Independent with basic ADLs, Independent with gait, Independent with transfers, and Requires assistive device for independence  PATIENT GOALS: gain some strength back into this L side I dont want to be completely immobile  OBJECTIVE:  Note: Objective measures were completed at Evaluation unless otherwise noted.  DIAGNOSTIC FINDINGS:   Brain MRI 12/07/2023 IMPRESSION: 1. Stable to slightly decreased size of residual tumor in the left anterior cranial fossa measuring 6 x 26 x 21 mm. 2. Stable vasogenic edema in the left frontal lobe. 3. No acute intracranial abnormality or significant interval change.  COGNITION: Overall cognitive status: Impaired; has noticed issues with memory, problem-solving, etc.   SENSATION: Has N/T in feet, feet go numb sometimes Impaired proprioception in LLE>RLE Decreased light touch sensation in BLE LLE>RLE  COORDINATION: Impaired in L hemibody  EDEMA:  In LLE>RLE, wears support socks  POSTURE: rounded shoulders, forward head, and posterior pelvic tilt  LOWER EXTREMITY ROM:   WFL  Passive  Right Eval Left Eval  Hip flexion    Hip extension    Hip abduction    Hip adduction    Hip internal rotation    Hip external rotation    Knee flexion    Knee extension    Ankle dorsiflexion    Ankle plantarflexion    Ankle inversion    Ankle eversion     (Blank rows = not tested)  LOWER EXTREMITY MMT:    MMT Right Eval Left Eval  Hip flexion 5 2+  Hip extension     Hip abduction    Hip adduction    Hip internal rotation    Hip external rotation    Knee flexion 5 2+  Knee extension 5 2+  Ankle dorsiflexion 5 2+  Ankle plantarflexion    Ankle inversion    Ankle eversion    (Blank rows = not tested)  BED MOBILITY:  Mod I (sleeps in regular bed, had to pull from mattress, little rough)  TRANSFERS: Sit to stand: Modified independence  Assistive device utilized: None     Stand to sit: Modified independence  Assistive device utilized: None     Chair to chair: Modified independence  Assistive device utilized: None       *needs increased time  RAMP:  Not tested  CURB:  Not tested  STAIRS: Not tested GAIT: Findings:  Gait pattern: decreased hip/knee flexion- Left, decreased ankle dorsiflexion- Left, and genu recurvatum- Left Distance walked: various clinic distances Assistive device utilized: None Level of assistance: SBA Comments: to be further assessed in a functional context   FUNCTIONAL TESTS:     VITALS: Vitals:   10/30/24 1115  BP: 110/71  Pulse: 69  TREATMENT DATE:   Self-Care/Home Management/Ther Act  Encouraged pt to reach out to PCP regarding LLE pain/swelling and inquire about lymphedema PT. Plan to get a referral to return to PT after lymphedema therapy completed. 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)   Physical Performance For LTG assessment:  [facility] PT Assessment - 10/30/24 1120       Ambulation/Gait   Gait velocity 32.8 ft over 15.68 sec = 2.09 ft/sec   no AD     Standardized Balance Assessment   Standardized Balance Assessment Timed Up and Go Test;Five Times Sit to Stand    Five times sit to stand comments  33.75 sec   BUE support     Timed Up  and Go Test   TUG Normal TUG    Normal TUG (seconds) 17.56   no AD     Functional Gait  Assessment   Gait assessed  Yes    Gait Level Surface Walks 20 ft, slow speed, abnormal gait pattern, evidence for imbalance or deviates 10-15 in outside of the 12 in walkway width. Requires more than 7 sec to ambulate 20 ft.    Change in Gait Speed Makes only minor adjustments to walking speed, or accomplishes a change in speed with significant gait deviations, deviates 10-15 in outside the 12 in walkway width, or changes speed but loses balance but is able to recover and continue walking.    Gait with Horizontal Head Turns Performs head turns with moderate changes in gait velocity, slows down, deviates 10-15 in outside 12 in walkway width but recovers, can continue to walk.    Gait with Vertical Head Turns Performs task with slight change in gait velocity (eg, minor disruption to smooth gait path), deviates 6 - 10 in outside 12 in walkway width or uses assistive device    Gait and Pivot Turn Turns slowly, requires verbal cueing, or requires several small steps to catch balance following turn and stop    Step Over Obstacle Is able to step over one shoe box (4.5 in total height) but must slow down and adjust steps to clear box safely. May require verbal cueing.    Gait with Narrow Base of Support Ambulates 4-7 steps.    Gait with Eyes Closed Walks 20 ft, uses assistive device, slower speed, mild gait deviations, deviates 6-10 in outside 12 in walkway width. Ambulates 20 ft in less than 9 sec but greater than 7 sec.    Ambulating Backwards Walks 20 ft, uses assistive device, slower speed, mild gait deviations, deviates 6-10 in outside 12 in walkway width.    Steps Two feet to a stair, must use rail.    Total Score 13    FGA comment: 13/30, high fall risk         PATIENT EDUCATION: Education details: Contact PCP about lymphedema PT, can return to OPPT with a new referral after lymphedema therapy, results of  OM and functional implications Person educated: Patient Education method: Explanation Education comprehension: verbalized understanding  HOME EXERCISE PROGRAM: To be initiated  GOALS: Goals reviewed with patient? Yes  SHORT TERM GOALS: Target date: 10/07/2024   Pt will be independent with initial HEP for improved strength, balance, transfers and gait. Baseline: not yet established Goal status: IN PROGRESS  2.  Pt will improve 5 x STS to less than or  equal to 46 seconds to demonstrate improved functional strength and transfer efficiency.  Baseline: 49.25 sec with BUE (9/23), 27.62 sec BUE (10/21) Goal status: MET  3.  Pt will improve gait velocity to at least 2.75 ft/sec for improved gait efficiency and performance at SBA level  Baseline: 2.47 ft/sec no AD SBA (9/23), 1.84 ft/sec no AD SBA (10/21) Goal status: NOT MET  4.  Pt will improve FGA to 10/30 for decreased fall risk  Baseline: 6/30 (9/23), 10/30 (10/21) Goal status: MET  5.  Pt will ambulate greater than or equal to 850 feet on with LRAD and mod I for improved cardiovascular endurance and BLE strength.  Baseline: 600 ft no AD (9/30); 460' no AD (11/4) Goal status: NOT MET     LONG TERM GOALS: Target date: 10/30/2024 (updated to match last appointment date)    Pt will be independent with final HEP for improved strength, balance, transfers and gait. Baseline: not established due to medical instability Goal status: NOT MET  2.  Pt will improve 5 x STS to less than or equal to 24 seconds to demonstrate improved functional strength and transfer efficiency.  Baseline: 49.25 sec with BUE (9/23), 27.62 sec BUE (10/21), 33.75 sec BUE (11/6) Goal status: NOT MET  3.  Pt will improve gait velocity to at least 2.75 ft/sec for improved gait efficiency and performance at mod I level  Baseline: 2.47 ft/sec no AD SBA (9/23), 1.84 ft/sec no AD SBA (10/21), 2.09 ft/sec no AD SBA (11/6) Goal status: NOT MET  4.  Pt will  improve FGA to 14/30 for decreased fall risk  Baseline: 6/30 (9/23), 10/30 (10/21), 13/30 (11/6) Goal status: INTO MET  5.  Pt will improve normal TUG to less than or equal to 13 seconds for improved functional mobility and decreased fall risk. Baseline: 15.6 sec no AD (9/23), 17.56 sec no AD (11/6) Goal status: NOT MET  6.  Pt will ambulate greater than or equal to 1000 feet on with LRAD and mod I for improved cardiovascular endurance and BLE strength.  Baseline: 600 ft no AD (9/30); 460' no AD (11/4) Goal status: NOT MET   ASSESSMENT:  CLINICAL IMPRESSION: Emphasis of skilled PT session on continuing to monitor vitals and then assessing LTG with planned d/c from OPPT services due to medical instability. Pt did not meet 6/6 LTG as her scores worsened on most objective outcome measures. However, this is to be expected given her medical instability with LLE lymphedema and ongoing seizures. Pt to reach out to her PCP for a referral to lymphedema therapy and can return to OP neuro PT once that is completed with a new referral.     OBJECTIVE IMPAIRMENTS: Abnormal gait, cardiopulmonary status limiting activity, decreased activity tolerance, decreased balance, decreased cognition, decreased coordination, decreased endurance, decreased knowledge of use of DME, decreased mobility, difficulty walking, decreased ROM, decreased strength, dizziness, impaired perceived functional ability, impaired sensation, impaired UE functional use, impaired vision/preception, improper body mechanics, postural dysfunction, and pain.   ACTIVITY LIMITATIONS: carrying, lifting, bending, standing, squatting, stairs, transfers, bed mobility, reach over head, hygiene/grooming, and locomotion level  PARTICIPATION LIMITATIONS: meal prep, cleaning, laundry, medication management, interpersonal relationship, driving, shopping, and community activity  PERSONAL FACTORS: Time since onset of injury/illness/exacerbation and 3+  comorbidities:   GERD, anxiety, depression, seizures, sleep apnea, obesityare also affecting patient's functional outcome.   REHAB POTENTIAL: Good  CLINICAL DECISION MAKING: Unstable/unpredictable  EVALUATION COMPLEXITY: High  PLAN: discharge from PT  Waddell Southgate, PT Waddell Southgate, PT, DPT, CSRS  10/30/2024, 11:43 AM  For all possible CPT codes, reference the Planned Interventions line above.     Check all conditions that are expected to impact treatment: {Conditions expected to impact treatment:Neurological condition and/or seizures   If treatment provided at initial evaluation, no treatment charged due to lack of authorization.

## 2024-10-30 NOTE — Telephone Encounter (Signed)
 I will send to Dr. Gregg to review. I read the chart and he didn't mention disability or not working.

## 2024-10-30 NOTE — Telephone Encounter (Signed)
 Pt stopped in today and states that she needs a letter from Dr. Gregg stating she cannot work due to her medical issues. She says disability is giving her issues. I left paperwork in POD [employer] in [place] bin to review, but pt has no forms to fill out. Not sure if there is a $50 fee for this, I told pt that if there is, she would have to pay before we send letter. She says letter has to go to her lawyer at the following email address: [email or web address]

## 2024-10-31 ENCOUNTER — Encounter: Payer: Self-pay | Admitting: Neurology

## 2024-10-31 NOTE — Telephone Encounter (Signed)
 Letter completed, please fax to email address.

## 2024-10-31 NOTE — Telephone Encounter (Signed)
 Pt has called stating she was returning a call to Dr Gregg.

## 2024-11-07 ENCOUNTER — Encounter: Payer: Self-pay | Admitting: Neurology

## 2024-11-13 ENCOUNTER — Ambulatory Visit
Admission: RE | Admit: 2024-11-13 | Discharge: 2024-11-13 | Disposition: A | Discharge status: Home / Self Care | Source: Ambulatory Visit | Attending: Neurology | Admitting: Neurology

## 2024-11-13 DIAGNOSIS — D320 Benign neoplasm of cerebral meninges: Secondary | ICD-10-CM

## 2024-11-13 MED ORDER — GADOPICLENOL 0.5 MMOL/ML IV SOLN
10.0000 mL | Freq: Once | INTRAVENOUS | Status: AC | PRN
  Administered 2024-11-13: 10 mL via INTRAVENOUS

## 2024-11-17 ENCOUNTER — Other Ambulatory Visit: Payer: Self-pay | Admitting: Neurology

## 2024-11-17 NOTE — Telephone Encounter (Signed)
 Last seen on 07/28/24 Follow up scheduled 03/16/25

## 2024-11-18 ENCOUNTER — Ambulatory Visit: Payer: Self-pay | Admitting: Neurology

## 2024-12-29 ENCOUNTER — Other Ambulatory Visit: Payer: Self-pay | Admitting: Neurology

## 2025-03-16 ENCOUNTER — Ambulatory Visit: Admitting: Neurology
